# Patient Record
Sex: Male | Born: 1945
Health system: Southern US, Community
[De-identification: ages and names within clinical notes are randomized; demographics above are authoritative.]

## PROBLEM LIST (undated history)

## (undated) DIAGNOSIS — H9190 Unspecified hearing loss, unspecified ear: Secondary | ICD-10-CM

## (undated) DIAGNOSIS — I4891 Unspecified atrial fibrillation: Secondary | ICD-10-CM

## (undated) DIAGNOSIS — I493 Ventricular premature depolarization: Secondary | ICD-10-CM

## (undated) DIAGNOSIS — Z87891 Personal history of nicotine dependence: Secondary | ICD-10-CM

## (undated) DIAGNOSIS — F419 Anxiety disorder, unspecified: Secondary | ICD-10-CM

## (undated) DIAGNOSIS — Z87442 Personal history of urinary calculi: Secondary | ICD-10-CM

## (undated) DIAGNOSIS — T753XXA Motion sickness, initial encounter: Secondary | ICD-10-CM

## (undated) DIAGNOSIS — Z8619 Personal history of other infectious and parasitic diseases: Secondary | ICD-10-CM

## (undated) DIAGNOSIS — I10 Essential (primary) hypertension: Secondary | ICD-10-CM

## (undated) DIAGNOSIS — H3530 Unspecified macular degeneration: Secondary | ICD-10-CM

## (undated) DIAGNOSIS — J449 Chronic obstructive pulmonary disease, unspecified: Secondary | ICD-10-CM

## (undated) DIAGNOSIS — C801 Malignant (primary) neoplasm, unspecified: Secondary | ICD-10-CM

## (undated) DIAGNOSIS — M1990 Unspecified osteoarthritis, unspecified site: Secondary | ICD-10-CM

## (undated) DIAGNOSIS — R011 Cardiac murmur, unspecified: Secondary | ICD-10-CM

## (undated) DIAGNOSIS — G4733 Obstructive sleep apnea (adult) (pediatric): Secondary | ICD-10-CM

## (undated) DIAGNOSIS — I2510 Atherosclerotic heart disease of native coronary artery without angina pectoris: Secondary | ICD-10-CM

## (undated) DIAGNOSIS — K219 Gastro-esophageal reflux disease without esophagitis: Secondary | ICD-10-CM

## (undated) DIAGNOSIS — K4020 Bilateral inguinal hernia, without obstruction or gangrene, not specified as recurrent: Secondary | ICD-10-CM

## (undated) HISTORY — DX: Bilateral inguinal hernia, without obstruction or gangrene, not specified as recurrent: K40.20

## (undated) HISTORY — DX: Atherosclerotic heart disease of native coronary artery without angina pectoris: I25.10

## (undated) HISTORY — DX: Chronic obstructive pulmonary disease, unspecified: J44.9

## (undated) HISTORY — DX: Ventricular premature depolarization: I49.3

## (undated) HISTORY — DX: Personal history of nicotine dependence: Z87.891

## (undated) HISTORY — DX: Obstructive sleep apnea (adult) (pediatric): G47.33

## (undated) HISTORY — DX: Personal history of other infectious and parasitic diseases: Z86.19

## (undated) HISTORY — PX: HERNIA REPAIR: SHX51

---
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
## 1999-06-30 HISTORY — PX: CYSTOSCOPY WITH HOLMIUM LASER LITHOTRIPSY: SHX6639

## 1999-06-30 HISTORY — PX: OTHER SURGICAL HISTORY: SHX169

## 2000-06-29 HISTORY — PX: OTHER SURGICAL HISTORY: SHX169

## 2000-11-19 ENCOUNTER — Ambulatory Visit (HOSPITAL_BASED_OUTPATIENT_CLINIC_OR_DEPARTMENT_OTHER): Admission: RE | Admit: 2000-11-19 | Discharge: 2000-11-19 | Payer: Self-pay | Admitting: Otolaryngology

## 2006-06-24 ENCOUNTER — Ambulatory Visit: Payer: Self-pay | Admitting: General Surgery

## 2006-06-24 HISTORY — PX: OTHER SURGICAL HISTORY: SHX169

## 2006-06-24 HISTORY — PX: COLONOSCOPY: SHX174

## 2008-06-29 HISTORY — PX: CARDIAC CATHETERIZATION: SHX172

## 2012-04-11 DIAGNOSIS — R030 Elevated blood-pressure reading, without diagnosis of hypertension: Secondary | ICD-10-CM | POA: Diagnosis not present

## 2012-04-11 DIAGNOSIS — F17200 Nicotine dependence, unspecified, uncomplicated: Secondary | ICD-10-CM | POA: Diagnosis not present

## 2012-04-11 DIAGNOSIS — IMO0001 Reserved for inherently not codable concepts without codable children: Secondary | ICD-10-CM | POA: Diagnosis not present

## 2012-04-25 DIAGNOSIS — E7800 Pure hypercholesterolemia, unspecified: Secondary | ICD-10-CM | POA: Diagnosis not present

## 2012-04-25 DIAGNOSIS — Z136 Encounter for screening for cardiovascular disorders: Secondary | ICD-10-CM | POA: Diagnosis not present

## 2012-04-25 DIAGNOSIS — F17200 Nicotine dependence, unspecified, uncomplicated: Secondary | ICD-10-CM | POA: Diagnosis not present

## 2012-04-25 DIAGNOSIS — R030 Elevated blood-pressure reading, without diagnosis of hypertension: Secondary | ICD-10-CM | POA: Diagnosis not present

## 2012-04-25 DIAGNOSIS — Z125 Encounter for screening for malignant neoplasm of prostate: Secondary | ICD-10-CM | POA: Diagnosis not present

## 2012-05-28 DIAGNOSIS — Z23 Encounter for immunization: Secondary | ICD-10-CM | POA: Diagnosis not present

## 2012-05-30 DIAGNOSIS — Z1211 Encounter for screening for malignant neoplasm of colon: Secondary | ICD-10-CM | POA: Diagnosis not present

## 2012-05-30 DIAGNOSIS — Z86010 Personal history of colonic polyps: Secondary | ICD-10-CM | POA: Diagnosis not present

## 2012-05-30 DIAGNOSIS — D126 Benign neoplasm of colon, unspecified: Secondary | ICD-10-CM | POA: Diagnosis not present

## 2012-06-09 DIAGNOSIS — IMO0001 Reserved for inherently not codable concepts without codable children: Secondary | ICD-10-CM | POA: Diagnosis not present

## 2012-06-09 DIAGNOSIS — F17200 Nicotine dependence, unspecified, uncomplicated: Secondary | ICD-10-CM | POA: Diagnosis not present

## 2012-06-09 DIAGNOSIS — R030 Elevated blood-pressure reading, without diagnosis of hypertension: Secondary | ICD-10-CM | POA: Diagnosis not present

## 2012-07-19 DIAGNOSIS — Z01818 Encounter for other preprocedural examination: Secondary | ICD-10-CM | POA: Diagnosis not present

## 2012-07-19 DIAGNOSIS — F17200 Nicotine dependence, unspecified, uncomplicated: Secondary | ICD-10-CM | POA: Diagnosis not present

## 2012-07-19 DIAGNOSIS — Z01810 Encounter for preprocedural cardiovascular examination: Secondary | ICD-10-CM | POA: Diagnosis not present

## 2012-07-19 DIAGNOSIS — K429 Umbilical hernia without obstruction or gangrene: Secondary | ICD-10-CM | POA: Diagnosis not present

## 2012-07-19 DIAGNOSIS — K4020 Bilateral inguinal hernia, without obstruction or gangrene, not specified as recurrent: Secondary | ICD-10-CM | POA: Diagnosis not present

## 2012-07-19 DIAGNOSIS — I491 Atrial premature depolarization: Secondary | ICD-10-CM | POA: Diagnosis not present

## 2012-07-20 DIAGNOSIS — K429 Umbilical hernia without obstruction or gangrene: Secondary | ICD-10-CM | POA: Insufficient documentation

## 2012-08-03 DIAGNOSIS — K4020 Bilateral inguinal hernia, without obstruction or gangrene, not specified as recurrent: Secondary | ICD-10-CM | POA: Diagnosis not present

## 2012-08-03 DIAGNOSIS — K429 Umbilical hernia without obstruction or gangrene: Secondary | ICD-10-CM | POA: Diagnosis not present

## 2012-08-23 DIAGNOSIS — K429 Umbilical hernia without obstruction or gangrene: Secondary | ICD-10-CM | POA: Diagnosis not present

## 2012-08-23 DIAGNOSIS — K4020 Bilateral inguinal hernia, without obstruction or gangrene, not specified as recurrent: Secondary | ICD-10-CM | POA: Diagnosis not present

## 2012-08-23 DIAGNOSIS — Z4889 Encounter for other specified surgical aftercare: Secondary | ICD-10-CM | POA: Diagnosis not present

## 2012-10-31 DIAGNOSIS — H353290 Exudative age-related macular degeneration, unspecified eye, stage unspecified: Secondary | ICD-10-CM | POA: Diagnosis not present

## 2013-03-28 DIAGNOSIS — Z23 Encounter for immunization: Secondary | ICD-10-CM | POA: Diagnosis not present

## 2013-04-12 DIAGNOSIS — Z86010 Personal history of colonic polyps: Secondary | ICD-10-CM | POA: Diagnosis not present

## 2013-04-12 DIAGNOSIS — D126 Benign neoplasm of colon, unspecified: Secondary | ICD-10-CM | POA: Diagnosis not present

## 2013-04-17 DIAGNOSIS — R0609 Other forms of dyspnea: Secondary | ICD-10-CM | POA: Diagnosis not present

## 2013-04-17 DIAGNOSIS — F17200 Nicotine dependence, unspecified, uncomplicated: Secondary | ICD-10-CM | POA: Diagnosis not present

## 2013-04-17 DIAGNOSIS — M25519 Pain in unspecified shoulder: Secondary | ICD-10-CM | POA: Diagnosis not present

## 2013-04-17 DIAGNOSIS — Z0000 Encounter for general adult medical examination without abnormal findings: Secondary | ICD-10-CM | POA: Diagnosis not present

## 2013-04-17 DIAGNOSIS — Z131 Encounter for screening for diabetes mellitus: Secondary | ICD-10-CM | POA: Diagnosis not present

## 2013-04-19 DIAGNOSIS — Z0000 Encounter for general adult medical examination without abnormal findings: Secondary | ICD-10-CM | POA: Insufficient documentation

## 2013-04-19 DIAGNOSIS — F17200 Nicotine dependence, unspecified, uncomplicated: Secondary | ICD-10-CM | POA: Insufficient documentation

## 2013-04-24 DIAGNOSIS — I700 Atherosclerosis of aorta: Secondary | ICD-10-CM | POA: Diagnosis not present

## 2013-04-24 DIAGNOSIS — R918 Other nonspecific abnormal finding of lung field: Secondary | ICD-10-CM | POA: Diagnosis not present

## 2013-04-24 DIAGNOSIS — R0609 Other forms of dyspnea: Secondary | ICD-10-CM | POA: Diagnosis not present

## 2013-07-10 DIAGNOSIS — H353290 Exudative age-related macular degeneration, unspecified eye, stage unspecified: Secondary | ICD-10-CM | POA: Diagnosis not present

## 2013-09-01 DIAGNOSIS — Z1212 Encounter for screening for malignant neoplasm of rectum: Secondary | ICD-10-CM | POA: Diagnosis not present

## 2013-09-01 DIAGNOSIS — R030 Elevated blood-pressure reading, without diagnosis of hypertension: Secondary | ICD-10-CM | POA: Diagnosis not present

## 2013-09-01 DIAGNOSIS — Z136 Encounter for screening for cardiovascular disorders: Secondary | ICD-10-CM | POA: Diagnosis not present

## 2013-09-01 DIAGNOSIS — Z0000 Encounter for general adult medical examination without abnormal findings: Secondary | ICD-10-CM | POA: Diagnosis not present

## 2013-09-01 DIAGNOSIS — Z125 Encounter for screening for malignant neoplasm of prostate: Secondary | ICD-10-CM | POA: Diagnosis not present

## 2013-09-01 DIAGNOSIS — R81 Glycosuria: Secondary | ICD-10-CM | POA: Diagnosis not present

## 2013-09-01 DIAGNOSIS — I4949 Other premature depolarization: Secondary | ICD-10-CM | POA: Diagnosis not present

## 2013-09-01 LAB — BASIC METABOLIC PANEL
BUN: 10 mg/dL (ref 4–21)
Creatinine: 1 mg/dL (ref 0.6–1.3)
Glucose: 84 mg/dL
POTASSIUM: 4.4 mmol/L (ref 3.4–5.3)
SODIUM: 144 mmol/L (ref 137–147)

## 2013-09-01 LAB — LIPID PANEL
CHOLESTEROL: 177 mg/dL (ref 0–200)
HDL: 43 mg/dL (ref 35–70)
LDL Cholesterol: 96 mg/dL
Triglycerides: 192 mg/dL — AB (ref 40–160)

## 2013-09-01 LAB — PSA: PSA: 1.5

## 2013-09-01 LAB — TSH: TSH: 2.71 u[IU]/mL (ref 0.41–5.90)

## 2013-09-01 LAB — HEMOGLOBIN A1C: Hemoglobin A1C: 5.8

## 2013-09-07 DIAGNOSIS — H903 Sensorineural hearing loss, bilateral: Secondary | ICD-10-CM | POA: Diagnosis not present

## 2013-09-07 DIAGNOSIS — H905 Unspecified sensorineural hearing loss: Secondary | ICD-10-CM | POA: Diagnosis not present

## 2014-04-26 DIAGNOSIS — Z23 Encounter for immunization: Secondary | ICD-10-CM | POA: Diagnosis not present

## 2014-10-22 DIAGNOSIS — L02439 Carbuncle of limb, unspecified: Secondary | ICD-10-CM | POA: Diagnosis not present

## 2014-10-29 DIAGNOSIS — L02439 Carbuncle of limb, unspecified: Secondary | ICD-10-CM | POA: Diagnosis not present

## 2015-01-29 DIAGNOSIS — H31113 Age-related choroidal atrophy, bilateral: Secondary | ICD-10-CM | POA: Diagnosis not present

## 2015-01-29 DIAGNOSIS — H524 Presbyopia: Secondary | ICD-10-CM | POA: Diagnosis not present

## 2015-01-29 DIAGNOSIS — H34831 Tributary (branch) retinal vein occlusion, right eye: Secondary | ICD-10-CM | POA: Diagnosis not present

## 2015-01-29 DIAGNOSIS — H3531 Nonexudative age-related macular degeneration: Secondary | ICD-10-CM | POA: Diagnosis not present

## 2015-04-16 DIAGNOSIS — Z23 Encounter for immunization: Secondary | ICD-10-CM | POA: Diagnosis not present

## 2015-08-05 DIAGNOSIS — H353133 Nonexudative age-related macular degeneration, bilateral, advanced atrophic without subfoveal involvement: Secondary | ICD-10-CM | POA: Diagnosis not present

## 2015-08-05 DIAGNOSIS — H348312 Tributary (branch) retinal vein occlusion, right eye, stable: Secondary | ICD-10-CM | POA: Diagnosis not present

## 2015-10-21 DIAGNOSIS — I493 Ventricular premature depolarization: Secondary | ICD-10-CM | POA: Insufficient documentation

## 2015-10-21 DIAGNOSIS — R81 Glycosuria: Secondary | ICD-10-CM | POA: Insufficient documentation

## 2015-10-21 DIAGNOSIS — K4020 Bilateral inguinal hernia, without obstruction or gangrene, not specified as recurrent: Secondary | ICD-10-CM | POA: Insufficient documentation

## 2015-10-22 ENCOUNTER — Ambulatory Visit (INDEPENDENT_AMBULATORY_CARE_PROVIDER_SITE_OTHER): Payer: Medicare Other | Admitting: Family Medicine

## 2015-10-22 ENCOUNTER — Encounter: Payer: Self-pay | Admitting: Family Medicine

## 2015-10-22 VITALS — BP 120/80 | HR 64 | Temp 98.5°F | Resp 16 | Wt 208.0 lb

## 2015-10-22 DIAGNOSIS — M19041 Primary osteoarthritis, right hand: Secondary | ICD-10-CM

## 2015-10-22 DIAGNOSIS — M67431 Ganglion, right wrist: Secondary | ICD-10-CM | POA: Diagnosis not present

## 2015-10-22 NOTE — Progress Notes (Signed)
       Patient: Jeffrey Torres Male    DOB: 1946-03-26   70 y.o.   MRN: [id number] Visit Date: 10/22/2015  Today's Provider: Lelon Huh, MD   Chief Complaint  Patient presents with  . Wrist Pain   Subjective:    Wrist Pain  The pain is present in the right fingers, right hand and right wrist. This is a recurrent problem. The current episode started more than 1 year ago (1 1/2 years ago). There has been no history of extremity trauma. The problem occurs constantly. The problem has been gradually worsening. The quality of the pain is described as aching. The pain is at a severity of 8/10. The pain is moderate. Associated symptoms include a limited range of motion, numbness, stiffness and tingling. Pertinent negatives include no fever, inability to bear weight, joint locking or joint swelling. The symptoms are aggravated by cold. He has tried NSAIDS (aleve) for the symptoms. The treatment provided moderate relief.    Knot on under side of right wrist came up about 2 years ago and has slowly grown since. 1 1/2 years ago a knot came up on top of right wrist and has grown since. Pain, numbness and tingling started 6 months ago in the right wrist. Patient takes aleve for the pain with moderate relief. He states his knuckles are also painful when he uses his hands.   No Known Allergies Previous Medications   ASCORBIC ACID (VITAMIN C PO)    Take by mouth daily.   ASPIRIN 81 MG TABLET    Take 81 mg by mouth daily.   MULTIPLE VITAMINS-MINERALS (EYE SUPPORT PO)    Take by mouth daily.    Review of Systems  Constitutional: Negative for fever, chills and appetite change.  Respiratory: Negative for chest tightness, shortness of breath and wheezing.   Cardiovascular: Negative for chest pain and palpitations.  Gastrointestinal: Negative for nausea, vomiting and abdominal pain.  Musculoskeletal: Positive for stiffness.  Neurological: Positive for tingling and numbness.    Social History    Substance Use Topics  . Smoking status: Current Every Day Smoker -- 0.75 packs/day    Types: Cigarettes  . Smokeless tobacco: Not on file  . Alcohol Use: 0.0 oz/week    0 Standard drinks or equivalent per week     Comment: occasional use   Objective:   BP 120/80 mmHg  Pulse 64  Temp(Src) 98.5 F (36.9 C) (Oral)  Resp 16  Wt 208 lb (94.348 kg)  SpO2 97%  Physical Exam  Small non-tender ganglionic cyst volar aspect of right wrist. No erythema.     Assessment & Plan:     1. Ganglion cyst of wrist, right Asymptomatic. Discussed options of observation, excision, or steroid injections. Consider he has no pain, will defer any treatment at this time.   2. Osteoarthritis of right hand, unspecified osteoarthritis type May continue OTC NSAIDs per labile as needed.        Lelon Huh, MD  [facility]

## 2015-10-30 ENCOUNTER — Encounter: Payer: Self-pay | Admitting: Family Medicine

## 2015-10-30 DIAGNOSIS — Z860101 Personal history of adenomatous and serrated colon polyps: Secondary | ICD-10-CM | POA: Insufficient documentation

## 2015-10-30 DIAGNOSIS — Z86010 Personal history of colonic polyps: Secondary | ICD-10-CM | POA: Insufficient documentation

## 2015-12-09 ENCOUNTER — Encounter: Payer: Self-pay | Admitting: Family Medicine

## 2015-12-09 ENCOUNTER — Ambulatory Visit (INDEPENDENT_AMBULATORY_CARE_PROVIDER_SITE_OTHER): Payer: Medicare Other | Admitting: Family Medicine

## 2015-12-09 VITALS — BP 130/70 | HR 62 | Temp 97.6°F | Resp 18 | Ht 71.25 in | Wt 204.0 lb

## 2015-12-09 DIAGNOSIS — Z131 Encounter for screening for diabetes mellitus: Secondary | ICD-10-CM | POA: Diagnosis not present

## 2015-12-09 DIAGNOSIS — Z0000 Encounter for general adult medical examination without abnormal findings: Secondary | ICD-10-CM | POA: Diagnosis not present

## 2015-12-09 DIAGNOSIS — Z125 Encounter for screening for malignant neoplasm of prostate: Secondary | ICD-10-CM | POA: Diagnosis not present

## 2015-12-09 DIAGNOSIS — Z1159 Encounter for screening for other viral diseases: Secondary | ICD-10-CM

## 2015-12-09 NOTE — Progress Notes (Signed)
Patient: Jeffrey Torres, Male    DOB: 08/28/1945, 70 y.o.   MRN: [id number] Visit Date: 12/09/2015  Today's Provider: Lelon Huh, MD   Chief Complaint  Patient presents with  . Medicare Wellness  . Irregular Heart Beat    follow up   Subjective:    Annual wellness visit Jeffrey Torres is a 70 y.o. male. He feels well. He reports exercising daily. He reports he is sleeping fairly well.    Review of Systems  Constitutional: Negative for fever, chills, appetite change and fatigue.  HENT: Positive for congestion, sneezing and tinnitus. Negative for ear pain, hearing loss, nosebleeds and trouble swallowing.   Eyes: Positive for photophobia. Negative for pain and visual disturbance.  Respiratory: Positive for cough. Negative for chest tightness and shortness of breath.   Cardiovascular: Negative for chest pain, palpitations and leg swelling.  Gastrointestinal: Negative for nausea, vomiting, abdominal pain, diarrhea, constipation and blood in stool.  Endocrine: Negative for polydipsia, polyphagia and polyuria.  Genitourinary: Negative for dysuria and flank pain.  Musculoskeletal: Positive for back pain, arthralgias and neck stiffness. Negative for myalgias and joint swelling.  Skin: Negative for color change, rash and wound.  Neurological: Negative for dizziness, tremors, seizures, speech difficulty, weakness, light-headedness and headaches.  Psychiatric/Behavioral: Positive for sleep disturbance. Negative for behavioral problems, confusion, dysphoric mood and decreased concentration. The patient is not nervous/anxious.   All other systems reviewed and are negative.   Social History   Social History  . Marital Status: Married    Spouse Name: N/A  . Number of Children: N/A  . Years of Education: N/A   Occupational History  . Not on file.   Social History Main Topics  . Smoking status: Current Every Day Smoker -- 0.75 packs/day    Types: Cigarettes  . Smokeless  tobacco: Not on file  . Alcohol Use: 0.0 oz/week    0 Standard drinks or equivalent per week     Comment: occasional use  . Drug Use: No  . Sexual Activity: Not on file   Other Topics Concern  . Not on file   Social History Narrative    Past Medical History  Diagnosis Date  . Glucosuria   . PVC (premature ventricular contraction)   . Hernia, inguinal, bilateral   . History of chicken pox   . History of mumps   . History of measles      Patient Active Problem List   Diagnosis Date Noted  . History of adenomatous polyp of colon 10/30/2015  . Inguinal hernia, bilateral 10/21/2015  . PVC (premature ventricular contraction) 10/21/2015  . Glucosuria 10/21/2015    Past Surgical History  Procedure Laterality Date  . Tubular adenoma removed  06/24/2006  . Sinus surgery   2002    [place]; removal of polyps  . Kidney stones removed  2001    [place]  . Tonsillectomy  1954  . Cardiac catheterization  2010    normal per patient report  . Colonoscopy  06/24/2006    Dr. Bary Castilla. Multiple benign appearing 76m polyps in the cecum and in the rectum. -Three 874mpolyps in the transversed colon, in the tranverse colon, proximal and in the distal transverse colon. Resected and retrieved.    His family history includes Heart disease in his father; Lung cancer in his mother.    Current Meds  Medication Sig  . Ascorbic Acid (VITAMIN C PO) Take by mouth daily.  . Marland Kitchenspirin 81 MG  tablet Take 81 mg by mouth daily.  . Multiple Vitamins-Minerals (EYE SUPPORT PO) Take by mouth daily.    Patient Care Team: Birdie Sons, MD as PCP - General (Family Medicine) Historical Provider, MD    Objective:   Vitals: BP 130/70 mmHg  Pulse 62  Temp(Src) 97.6 F (36.4 C) (Oral)  Resp 18  Ht 5' 11.25" (1.81 m)  Wt 204 lb (92.534 kg)  BMI 28.25 kg/m2  SpO2 97%  Physical Exam  General appearance: alert, well developed, well nourished, cooperative and in no distress Head: Normocephalic,  without obvious abnormality, atraumatic Respiratory: Respirations even and unlabored, normal respiratory rate Extremities: No gross deformities Skin: Skin color, texture, turgor normal. No rashes seen  Psych: Appropriate mood and affect. Neurologic: Mental status: Alert, oriented to person, place, and time, thought content appropriate.  Activities of Daily Living In your present state of health, do you have any difficulty performing the following activities: 12/09/2015  Hearing? Y  Vision? Y  Difficulty concentrating or making decisions? N  Walking or climbing stairs? N  Dressing or bathing? N  Doing errands, shopping? N    Fall Risk Assessment Fall Risk  12/09/2015  Falls in the past year? No     Depression Screen PHQ 2/9 Scores 12/09/2015  PHQ - 2 Score 0    Cognitive Testing - 6-CIT  Correct? Score   What year is it? yes 0 0 or 4  What month is it? yes 0 0 or 3  Memorize:    Pia Mau,  43,  [place],      What time is it? (within 1 hour) yes 0 0 or 3  Count backwards from 20 yes 0 0, 2, or 4  Name the months of the year yes 0 0, 2, or 4  Repeat name & address above no 3 0, 2, 4, 6, 8, or 10       TOTAL SCORE  3/28   Interpretation:  Normal  Normal (0-7) Abnormal (8-28)    Audit-C Alcohol Use Screening  Question Answer Points  How often do you have alcoholic drink? 2-4 times monthly 2  On days you do drink alcohol, how many drinks do you typically consume? 5 or 6 2  How oftey will you drink 6 or more in a total? Less than once/ monthly 1  Total Score:  5   A score of 3 or more in women, and 4 or more in men indicates increased risk for alcohol abuse, EXCEPT if all of the points are from question 1.  Current Exercise Habits: Home exercise routine, Type of exercise: walking, Time (Minutes): 30, Frequency (Times/Week): 7, Weekly Exercise (Minutes/Week): 210, Intensity: Mild Exercise limited by: None identified   Assessment & Plan:     Annual  Wellness Visit  Reviewed patient's Family Medical History Reviewed and updated list of patient's medical providers Assessment of cognitive impairment was done Assessed patient's functional ability Established a written schedule for health screening [place] Completed and Reviewed  Exercise Activities and Dietary recommendations Goals    None      Immunization History  Administered Date(s) Administered  . Pneumococcal Conjugate-13 04/26/2014  . Pneumococcal Polysaccharide-23 05/28/2012    Health Maintenance  Topic Date Due  . Hepatitis C Screening  1946-01-23  . TETANUS/TDAP  03/13/1966  . ZOSTAVAX  03/14/2007  . INFLUENZA VACCINE  01/28/2016  . COLONOSCOPY  04/11/2016  . PNA vac Low Risk Adult  Completed  Discussed health benefits of physical activity, and encouraged him to engage in regular exercise appropriate for his age and condition.    ------------------------------------------------------------------------------------------------------------ 1. Medicare annual wellness visit, subsequent Doing well. He is going to check with his pharmacist regarding coverage for Shingles vaccine  2. Need for hepatitis C screening test  - Hepatitis C antibody  3. Diabetes mellitus screening  - Glucose  4. Prostate cancer screening  - PSA  The entirety of the information documented in the History of Present Illness, Review of Systems and Physical Exam were personally obtained by me. Portions of this information were initially documented by Meyer Cory, CMA and reviewed by me for thoroughness and accuracy.     Lelon Huh, MD  [facility]

## 2015-12-10 LAB — PSA: PROSTATE SPECIFIC AG, SERUM: 1.7 ng/mL (ref 0.0–4.0)

## 2015-12-10 LAB — HEPATITIS C ANTIBODY: Hep C Virus Ab: <0.1 s/co ratio (ref 0.0–0.9)

## 2015-12-10 LAB — GLUCOSE, RANDOM: GLUCOSE: 94 mg/dL (ref 65–99)

## 2015-12-27 DIAGNOSIS — H353221 Exudative age-related macular degeneration, left eye, with active choroidal neovascularization: Secondary | ICD-10-CM | POA: Diagnosis not present

## 2016-01-03 DIAGNOSIS — H353211 Exudative age-related macular degeneration, right eye, with active choroidal neovascularization: Secondary | ICD-10-CM | POA: Diagnosis not present

## 2016-01-06 DIAGNOSIS — H353221 Exudative age-related macular degeneration, left eye, with active choroidal neovascularization: Secondary | ICD-10-CM | POA: Diagnosis not present

## 2016-02-03 DIAGNOSIS — H353211 Exudative age-related macular degeneration, right eye, with active choroidal neovascularization: Secondary | ICD-10-CM | POA: Diagnosis not present

## 2016-02-03 DIAGNOSIS — H353221 Exudative age-related macular degeneration, left eye, with active choroidal neovascularization: Secondary | ICD-10-CM | POA: Diagnosis not present

## 2016-03-06 DIAGNOSIS — H353221 Exudative age-related macular degeneration, left eye, with active choroidal neovascularization: Secondary | ICD-10-CM | POA: Diagnosis not present

## 2016-04-14 DIAGNOSIS — Z23 Encounter for immunization: Secondary | ICD-10-CM | POA: Diagnosis not present

## 2016-04-28 DIAGNOSIS — H2513 Age-related nuclear cataract, bilateral: Secondary | ICD-10-CM | POA: Diagnosis not present

## 2016-04-28 DIAGNOSIS — H353230 Exudative age-related macular degeneration, bilateral, stage unspecified: Secondary | ICD-10-CM | POA: Diagnosis not present

## 2016-04-28 DIAGNOSIS — H35362 Drusen (degenerative) of macula, left eye: Secondary | ICD-10-CM | POA: Diagnosis not present

## 2016-04-28 DIAGNOSIS — H43393 Other vitreous opacities, bilateral: Secondary | ICD-10-CM | POA: Diagnosis not present

## 2016-06-16 DIAGNOSIS — H2513 Age-related nuclear cataract, bilateral: Secondary | ICD-10-CM | POA: Diagnosis not present

## 2016-06-16 DIAGNOSIS — H43813 Vitreous degeneration, bilateral: Secondary | ICD-10-CM | POA: Diagnosis not present

## 2016-06-16 DIAGNOSIS — H353230 Exudative age-related macular degeneration, bilateral, stage unspecified: Secondary | ICD-10-CM | POA: Diagnosis not present

## 2016-08-04 DIAGNOSIS — K621 Rectal polyp: Secondary | ICD-10-CM | POA: Diagnosis not present

## 2016-08-04 DIAGNOSIS — D122 Benign neoplasm of ascending colon: Secondary | ICD-10-CM | POA: Diagnosis not present

## 2016-08-04 DIAGNOSIS — Z86010 Personal history of colonic polyps: Secondary | ICD-10-CM | POA: Diagnosis not present

## 2016-08-04 DIAGNOSIS — D123 Benign neoplasm of transverse colon: Secondary | ICD-10-CM | POA: Diagnosis not present

## 2016-08-04 DIAGNOSIS — D369 Benign neoplasm, unspecified site: Secondary | ICD-10-CM | POA: Diagnosis not present

## 2016-08-04 DIAGNOSIS — D124 Benign neoplasm of descending colon: Secondary | ICD-10-CM | POA: Diagnosis not present

## 2016-08-04 DIAGNOSIS — K635 Polyp of colon: Secondary | ICD-10-CM | POA: Diagnosis not present

## 2016-08-04 DIAGNOSIS — K5730 Diverticulosis of large intestine without perforation or abscess without bleeding: Secondary | ICD-10-CM | POA: Diagnosis not present

## 2016-08-04 LAB — HM COLONOSCOPY

## 2016-08-11 DIAGNOSIS — H353221 Exudative age-related macular degeneration, left eye, with active choroidal neovascularization: Secondary | ICD-10-CM | POA: Diagnosis not present

## 2016-08-24 ENCOUNTER — Encounter: Payer: Self-pay | Admitting: Family Medicine

## 2016-08-24 DIAGNOSIS — M4606 Spinal enthesopathy, lumbar region: Secondary | ICD-10-CM | POA: Diagnosis not present

## 2016-08-24 DIAGNOSIS — Z1151 Encounter for screening for human papillomavirus (HPV): Secondary | ICD-10-CM | POA: Insufficient documentation

## 2016-08-24 DIAGNOSIS — M9903 Segmental and somatic dysfunction of lumbar region: Secondary | ICD-10-CM | POA: Diagnosis not present

## 2016-08-25 DIAGNOSIS — M9903 Segmental and somatic dysfunction of lumbar region: Secondary | ICD-10-CM | POA: Diagnosis not present

## 2016-08-25 DIAGNOSIS — M4606 Spinal enthesopathy, lumbar region: Secondary | ICD-10-CM | POA: Diagnosis not present

## 2016-09-03 ENCOUNTER — Encounter: Payer: Self-pay | Admitting: Family Medicine

## 2016-10-06 DIAGNOSIS — H353221 Exudative age-related macular degeneration, left eye, with active choroidal neovascularization: Secondary | ICD-10-CM | POA: Diagnosis not present

## 2016-11-09 DIAGNOSIS — H2512 Age-related nuclear cataract, left eye: Secondary | ICD-10-CM | POA: Diagnosis not present

## 2016-12-08 DIAGNOSIS — H353221 Exudative age-related macular degeneration, left eye, with active choroidal neovascularization: Secondary | ICD-10-CM | POA: Diagnosis not present

## 2016-12-15 ENCOUNTER — Ambulatory Visit (INDEPENDENT_AMBULATORY_CARE_PROVIDER_SITE_OTHER): Payer: Medicare Other

## 2016-12-15 VITALS — BP 140/82 | HR 68 | Temp 97.5°F | Ht 71.0 in | Wt 209.0 lb

## 2016-12-15 DIAGNOSIS — Z0000 Encounter for general adult medical examination without abnormal findings: Secondary | ICD-10-CM | POA: Diagnosis not present

## 2016-12-15 NOTE — Progress Notes (Signed)
Subjective:   Jeffrey Torres is a 71 y.o. male who presents for Medicare Annual/Subsequent preventive examination.  Review of Systems:  N/A  Cardiac Risk Factors include: advanced age (>46men, >33 women);male gender;smoking/ tobacco exposure     Objective:    Vitals: BP 140/82 (BP Location: Right Arm)   Pulse 68   Temp 97.5 F (36.4 C) (Oral)   Ht 5\' 11"  (1.803 m)   Wt 209 lb (94.8 kg)   BMI 29.15 kg/m   Body mass index is 29.15 kg/m.  Tobacco History  Smoking Status  . Current Every Day Smoker  . Packs/day: 0.75  . Types: Cigarettes  Smokeless Tobacco  . Never Used     Ready to quit: Not Answered Counseling given: Not Answered   Past Medical History:  Diagnosis Date  . Glucosuria   . Hernia, inguinal, bilateral   . History of chicken pox   . History of measles   . History of mumps   . PVC (premature ventricular contraction)    Past Surgical History:  Procedure Laterality Date  . CARDIAC CATHETERIZATION  2010   normal per patient report  . COLONOSCOPY  06/24/2006   Dr. Bary Castilla. Multiple benign appearing 46mm polyps in the cecum and in the rectum. -Three 35mm polyps in the transversed colon, in the tranverse colon, proximal and in the distal transverse colon. Resected and retrieved.  . Kidney stones removed  2001   [place]  . sinus surgery   2002   [place]; removal of polyps  . TONSILLECTOMY  1954  . Tubular adenoma removed  06/24/2006   Family History  Problem Relation Age of Onset  . Lung cancer Mother   . Heart disease Father    History  Sexual Activity  . Sexual activity: Not on file    Outpatient Encounter Prescriptions as of 12/15/2016  Medication Sig  . Ascorbic Acid (VITAMIN C PO) Take 200 mg by mouth daily.   Marland Kitchen aspirin 81 MG tablet Take 81 mg by mouth daily.  . Multiple Vitamins-Minerals (OCUVITE EYE HEALTH FORMULA PO) Take by mouth daily.   . Omega-3 Fatty Acids (FISH OIL PO) Take 500 mg by mouth daily.   . [DISCONTINUED] Multiple  Vitamins-Minerals (EYE SUPPORT PO) Take by mouth daily.   No facility-administered encounter medications on file as of 12/15/2016.     Activities of Daily Living In your present state of health, do you have any difficulty performing the following activities: 12/15/2016  Hearing? Y  Vision? Y  Difficulty concentrating or making decisions? N  Walking or climbing stairs? N  Dressing or bathing? N  Doing errands, shopping? N  Preparing Food and eating ? N  Using the Toilet? N  In the past six months, have you accidently leaked urine? N  Do you have problems with loss of bowel control? N  Managing your Medications? N  Managing your Finances? N  Housekeeping or managing your Housekeeping? N  Some recent data might be hidden    Patient Care Team: Birdie Sons, MD as PCP - General (Family Medicine) [provider] Birder Robson, MD as Referring Physician (Ophthalmology)   Assessment:     Exercise Activities and Dietary recommendations Current Exercise Habits: Home exercise routine, Type of exercise: walking, Time (Minutes): 25, Frequency (Times/Week): 7, Weekly Exercise (Minutes/Week): 175, Intensity: Mild, Exercise limited by: None identified  Goals    . Increase water intake          Recommend increasing water intake  to 6-8 glasses a day.      Fall Risk Fall Risk  12/15/2016 12/09/2015  Falls in the past year? No No   Depression Screen PHQ 2/9 Scores 12/15/2016 12/15/2016 12/09/2015  PHQ - 2 Score 0 0 0  PHQ- 9 Score 4 - -    Cognitive Function     6CIT Screen 12/15/2016  What Year? 0 points  What month? 0 points  What time? 0 points  Count back from 20 0 points  Months in reverse 0 points  Repeat phrase 0 points  Total Score 0    Immunization History  Administered Date(s) Administered  . Pneumococcal Conjugate-13 04/26/2014  . Pneumococcal Polysaccharide-23 05/28/2012   Screening Tests Health Maintenance  Topic Date Due  . TETANUS/TDAP   11/27/2017 (Originally 03/13/1966)  . INFLUENZA VACCINE  01/27/2017  . COLONOSCOPY  08/05/2019  . Hepatitis C Screening  Completed  . PNA vac Low Risk Adult  Completed      Plan:  I have personally reviewed and addressed the Medicare Annual Wellness questionnaire and have noted the following in the patient's chart:  A. Medical and social history B. Use of alcohol, tobacco or illicit drugs  C. Current medications and supplements D. Functional ability and status E.  Nutritional status F.  Physical activity G. Advance directives H. List of other physicians I.  Hospitalizations, surgeries, and ER visits in previous 12 months J.  [place] such as hearing and vision if needed, cognitive and depression L. Referrals and appointments - none  In addition, I have reviewed and discussed with patient certain preventive protocols, quality metrics, and best practice recommendations. A written personalized care plan for preventive services as well as general preventive health recommendations were provided to patient.  See attached scanned questionnaire for additional information.   Signed,  Fabio Neighbors, LPN Nurse Health Advisor   MD Recommendations: None, pt declined tetanus vaccine today. Pt will check with insurance for coverage first.

## 2016-12-15 NOTE — Patient Instructions (Signed)
Jeffrey Torres , Thank you for taking time to come for your Medicare Wellness Visit. I appreciate your ongoing commitment to your health goals. Please review the following plan we discussed and let me know if I can assist you in the future.   Screening recommendations/referrals: Colonoscopy: completed 08/04/16, due 07/2026 Recommended yearly op/h/thalmology/optometry visit for glaucoma screening and checkup Recommended yearly dental visit for hygiene and checkup  Vaccinations: Influenza vaccine: due 02/2017 Pneumococcal vaccine: completed series Tdap vaccine: declined Shingles vaccine: completed per patient  Advanced directives: Advance directive discussed with you today. Even though you declined this today please call our office should you change your mind and we can give you the proper paperwork for you to fill out.  Conditions/risks identified:Recommend increasing water intake to 6-8 glasses a day.   Next appointment: None, need to schedule follow up with PCP and 1 year AWV.  Preventive Care 70 Years and Older, Male Preventive care refers to lifestyle choices and visits with your health care provider that can promote health and wellness. What does preventive care include?  A yearly physical exam. This is also called an annual well check.  Dental exams once or twice a year.  Routine eye exams. Ask your health care provider how often you should have your eyes checked.  Personal lifestyle choices, including:  Daily care of your teeth and gums.  Regular physical activity.  Eating a healthy diet.  Avoiding tobacco and drug use.  Limiting alcohol use.  Practicing safe sex.  Taking low doses of aspirin every day.  Taking vitamin and mineral supplements as recommended by your health care provider. What happens during an annual well check? The services and screenings done by your health care provider during your annual well check will depend on your age, overall health, lifestyle  risk factors, and family history of disease. Counseling  Your health care provider may ask you questions about your:  Alcohol use.  Tobacco use.  Drug use.  Emotional well-being.  Home and relationship well-being.  Sexual activity.  Eating habits.  History of falls.  Memory and ability to understand (cognition).  Work and work Statistician. Screening  You may have the following tests or measurements:  Height, weight, and BMI.  Blood pressure.  Lipid and cholesterol levels. These may be checked every 5 years, or more frequently if you are over 70 years old.  Skin check.  Lung cancer screening. You may have this screening every year starting at age 70 if you have a 30-pack-year history of smoking and currently smoke or have quit within the past 15 years.  Fecal occult blood test (FOBT) of the stool. You may have this test every year starting at age 70.  Flexible sigmoidoscopy or colonoscopy. You may have a sigmoidoscopy every 5 years or a colonoscopy every 10 years starting at age 70.  Prostate cancer screening. Recommendations will vary depending on your family history and other risks.  Hepatitis C blood test.  Hepatitis B blood test.  Sexually transmitted disease (STD) testing.  Diabetes screening. This is done by checking your blood sugar (glucose) after you have not eaten for a while (fasting). You may have this done every 1-3 years.  Abdominal aortic aneurysm (AAA) screening. You may need this if you are a current or former smoker.  Osteoporosis. You may be screened starting at age 70 if you are at high risk. Talk with your health care provider about your test results, treatment options, and if necessary, the need for more  tests. Vaccines  Your health care provider may recommend certain vaccines, such as:  Influenza vaccine. This is recommended every year.  Tetanus, diphtheria, and acellular pertussis (Tdap, Td) vaccine. You may need a Td booster every 10  years.  Zoster vaccine. You may need this after age 66.  Pneumococcal 13-valent conjugate (PCV13) vaccine. One dose is recommended after age 70.  Pneumococcal polysaccharide (PPSV23) vaccine. One dose is recommended after age 70. Talk to your health care provider about which screenings and vaccines you need and how often you need them. This information is not intended to replace advice given to you by your health care provider. Make sure you discuss any questions you have with your health care provider. Document Released: 07/12/2015 Document Revised: 03/04/2016 Document Reviewed: 04/16/2015 Elsevier Interactive Patient Education  2017 [place] Prevention in the Home Falls can cause injuries. They can happen to people of all ages. There are many things you can do to make your home safe and to help prevent falls. What can I do on the outside of my home?  Regularly fix the edges of walkways and driveways and fix any cracks.  Remove anything that might make you trip as you walk through a door, such as a raised step or threshold.  Trim any bushes or trees on the path to your home.  Use bright outdoor lighting.  Clear any walking paths of anything that might make someone trip, such as rocks or tools.  Regularly check to see if handrails are loose or broken. Make sure that both sides of any steps have handrails.  Any raised decks and porches should have guardrails on the edges.  Have any leaves, snow, or ice cleared regularly.  Use sand or salt on walking paths during winter.  Clean up any spills in your garage right away. This includes oil or grease spills. What can I do in the bathroom?  Use night lights.  Install grab bars by the toilet and in the tub and shower. Do not use towel bars as grab bars.  Use non-skid mats or decals in the tub or shower.  If you need to sit down in the shower, use a plastic, non-slip stool.  Keep the floor dry. Clean up any water that  spills on the floor as soon as it happens.  Remove soap buildup in the tub or shower regularly.  Attach bath mats securely with double-sided non-slip rug tape.  Do not have throw rugs and other things on the floor that can make you trip. What can I do in the bedroom?  Use night lights.  Make sure that you have a light by your bed that is easy to reach.  Do not use any sheets or blankets that are too big for your bed. They should not hang down onto the floor.  Have a firm chair that has side arms. You can use this for support while you get dressed.  Do not have throw rugs and other things on the floor that can make you trip. What can I do in the kitchen?  Clean up any spills right away.  Avoid walking on wet floors.  Keep items that you use a lot in easy-to-reach places.  If you need to reach something above you, use a strong step stool that has a grab bar.  Keep electrical cords out of the way.  Do not use floor polish or wax that makes floors slippery. If you must use wax, use non-skid floor  wax.  Do not have throw rugs and other things on the floor that can make you trip. What can I do with my stairs?  Do not leave any items on the stairs.  Make sure that there are handrails on both sides of the stairs and use them. Fix handrails that are broken or loose. Make sure that handrails are as long as the stairways.  Check any carpeting to make sure that it is firmly attached to the stairs. Fix any carpet that is loose or worn.  Avoid having throw rugs at the top or bottom of the stairs. If you do have throw rugs, attach them to the floor with carpet tape.  Make sure that you have a light switch at the top of the stairs and the bottom of the stairs. If you do not have them, ask someone to add them for you. What else can I do to help prevent falls?  Wear shoes that:  Do not have high heels.  Have rubber bottoms.  Are comfortable and fit you well.  Are closed at the  toe. Do not wear sandals.  If you use a stepladder:  Make sure that it is fully opened. Do not climb a closed stepladder.  Make sure that both sides of the stepladder are locked into place.  Ask someone to hold it for you, if possible.  Clearly mark and make sure that you can see:  Any grab bars or handrails.  First and last steps.  Where the edge of each step is.  Use tools that help you move around (mobility aids) if they are needed. These include:  Canes.  Walkers.  Scooters.  Crutches.  Turn on the lights when you go into a dark area. Replace any light bulbs as soon as they burn out.  Set up your furniture so you have a clear path. Avoid moving your furniture around.  If any of your floors are uneven, fix them.  If there are any pets around you, be aware of where they are.  Review your medicines with your doctor. Some medicines can make you feel dizzy. This can increase your chance of falling. Ask your doctor what other things that you can do to help prevent falls. This information is not intended to replace advice given to you by your health care provider. Make sure you discuss any questions you have with your health care provider. Document Released: 04/11/2009 Document Revised: 11/21/2015 Document Reviewed: 07/20/2014 Elsevier Interactive Patient Education  2017 [employer].

## 2017-02-22 DIAGNOSIS — H353221 Exudative age-related macular degeneration, left eye, with active choroidal neovascularization: Secondary | ICD-10-CM | POA: Diagnosis not present

## 2017-03-02 DIAGNOSIS — J387 Other diseases of larynx: Secondary | ICD-10-CM | POA: Diagnosis not present

## 2017-03-02 DIAGNOSIS — J301 Allergic rhinitis due to pollen: Secondary | ICD-10-CM | POA: Diagnosis not present

## 2017-03-02 DIAGNOSIS — K219 Gastro-esophageal reflux disease without esophagitis: Secondary | ICD-10-CM | POA: Diagnosis not present

## 2017-03-02 DIAGNOSIS — F17200 Nicotine dependence, unspecified, uncomplicated: Secondary | ICD-10-CM | POA: Diagnosis not present

## 2017-03-08 DIAGNOSIS — H353221 Exudative age-related macular degeneration, left eye, with active choroidal neovascularization: Secondary | ICD-10-CM | POA: Diagnosis not present

## 2017-03-23 ENCOUNTER — Ambulatory Visit: Payer: Medicare Other | Admitting: Family Medicine

## 2017-03-23 DIAGNOSIS — R490 Dysphonia: Secondary | ICD-10-CM | POA: Diagnosis not present

## 2017-03-23 DIAGNOSIS — D380 Neoplasm of uncertain behavior of larynx: Secondary | ICD-10-CM | POA: Diagnosis not present

## 2017-03-25 ENCOUNTER — Encounter: Payer: Self-pay | Admitting: Family Medicine

## 2017-03-25 ENCOUNTER — Ambulatory Visit (INDEPENDENT_AMBULATORY_CARE_PROVIDER_SITE_OTHER): Payer: Medicare Other | Admitting: Family Medicine

## 2017-03-25 VITALS — BP 110/60 | HR 80 | Temp 98.0°F | Resp 16 | Ht 71.0 in | Wt 222.0 lb

## 2017-03-25 DIAGNOSIS — Z01818 Encounter for other preprocedural examination: Secondary | ICD-10-CM | POA: Diagnosis not present

## 2017-03-25 DIAGNOSIS — F17210 Nicotine dependence, cigarettes, uncomplicated: Secondary | ICD-10-CM

## 2017-03-25 DIAGNOSIS — Z23 Encounter for immunization: Secondary | ICD-10-CM

## 2017-03-25 NOTE — Progress Notes (Signed)
Patient: Jeffrey Torres Male    DOB: 1946/06/14   71 y.o.   MRN: [id number] Visit Date: 03/25/2017  Today's Provider: Lelon Huh, MD   No chief complaint on file.  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 Had been smoker since he was in his 21s. From 1/2 to 1 1/2 ppd. He is also anticipating laryngeal surgery by Dr. Pryor Ochoa on 04-01-2017 and requires surgical clearance. He has anesthesia clearance scheduled tomorrow. He denies any dyspnea on exertion, chest pains, or heart flutters. No personal or family history of adverse reaction to anesthesia. No history of significant surgical complications.    Past Surgical History:  Procedure Laterality Date  . CARDIAC CATHETERIZATION  2010   normal per patient report  . COLONOSCOPY  06/24/2006   Dr. Bary Castilla. Multiple benign appearing 55mm polyps in the cecum and in the rectum. -Three 89mm polyps in the transversed colon, in the tranverse colon, proximal and in the distal transverse colon. Resected and retrieved.  . Kidney stones removed  2001   [place]  . sinus surgery   [place]; removal of polyps  . TONSILLECTOMY  1954  . Tubular adenoma removed  06/24/2006     Allergies  Allergen Reactions  . Penicillins Swelling    Other reaction(s): SWELLING/EDEMA     Current Outpatient Prescriptions:  .  aspirin 81 MG tablet, Take 81 mg by mouth daily., Disp: , Rfl:  .  fluticasone (FLONASE) 50 MCG/ACT nasal spray, Place 2 sprays into both nostrils every morning., Disp: , Rfl:  .  Multiple Vitamins-Minerals (PRESERVISION AREDS 2 PO), Take 2 capsules by mouth every morning., Disp: , Rfl:  .  omeprazole (PRILOSEC) 40 MG capsule, Take 40 mg by mouth every morning. 42 MIN BEFORE EATING, Disp: , Rfl:   Review of Systems  Constitutional: Negative for appetite change, chills and fever.  Respiratory: Negative for chest tightness, shortness of breath and wheezing.     Cardiovascular: Negative for chest pain and palpitations.  Gastrointestinal: Negative for abdominal pain, nausea and vomiting.    Social History  Substance Use Topics  . Smoking status: Current Every Day Smoker    Packs/day: 0.75    Types: Cigarettes  . Smokeless tobacco: Never Used  . Alcohol use 0.0 oz/week     Comment: occasional use- beer   Objective:   BP 110/60 (BP Location: Right Arm, Patient Position: Sitting, Cuff Size: Large)   Pulse 80   Temp 98 F (36.7 C) (Oral)   Resp 16   Ht 5\' 11"  (1.803 m)   Wt 222 lb (100.7 kg)   SpO2 94%   BMI 30.96 kg/m     Physical Exam  General Appearance:    Alert, cooperative, no distress  HENT:   ENT exam normal, no neck nodes or sinus tenderness  Eyes:    PERRL, conjunctiva/corneas clear, EOM's intact       Lungs:     Clear to auscultation bilaterally, respirations unlabored  Heart:    Regular rate and rhythm  Neurologic:   Awake, alert, oriented x 3. No apparent focal neurological           defect.      Spriometry: Normal EKG: Normal     Assessment & Plan:     1. Pre-op exam Patient has no absolute or relative contraindications to planned surgery and anesthesia including  general anesthesia. He is at low risk for cardiac and pulmonary complications. There are no additional medical precautions recommended.   - Spirometry with Graph - EKG 12-Lead  2. Need for influenza vaccination   - Flu vaccine HIGH DOSE PF  3. Smoking greater than 30 pack years  - Spirometry with Graph       Lelon Huh, MD  [facility]

## 2017-03-26 ENCOUNTER — Encounter
Admission: RE | Admit: 2017-03-26 | Discharge: 2017-03-26 | Disposition: A | Discharge status: Home / Self Care | Payer: Medicare Other | Source: Ambulatory Visit | Attending: Otolaryngology | Admitting: Otolaryngology

## 2017-03-26 DIAGNOSIS — Z01818 Encounter for other preprocedural examination: Secondary | ICD-10-CM | POA: Diagnosis not present

## 2017-03-26 HISTORY — DX: Unspecified macular degeneration: H35.30

## 2017-03-26 HISTORY — DX: Unspecified osteoarthritis, unspecified site: M19.90

## 2017-03-26 HISTORY — DX: Gastro-esophageal reflux disease without esophagitis: K21.9

## 2017-03-26 HISTORY — DX: Personal history of urinary calculi: Z87.442

## 2017-03-26 NOTE — Patient Instructions (Signed)
Your procedure is scheduled on: April 01, 2017 (THURSDAY ) Report to Same Day Surgery 2nd floor medical mall ([place] Entrance-take elevator on left to 2nd floor.  Check in with surgery information desk.) To find out your arrival time please call [phone] between 1PM - 3PM on March 31, 2017 [facility] )    Remember: Instructions that are not followed completely may result in serious medical risk, up to and including death, or upon the discretion of your surgeon and anesthesiologist your surgery may need to be rescheduled.    _x___ 1. Do not eat food after midnight the night before your procedure. You may drink clear liquids up to 2 hours before you are scheduled to arrive at the hospital for your procedure.  Do not drink clear liquids within 2 hours of your scheduled arrival to the hospital.  Clear liquids include  --Water or Apple juice without pulp  --Clear carbohydrate beverage such as ClearFast or Gatorade  --Black Coffee or Clear Tea (No milk, no creamers, do not add anything to                  the coffee or Tea Type 1 and type 2 diabetics should only drink water.  No gum chewing or hard candies.     __x__ 2. No Alcohol for 24 hours before or after surgery.   __x__3. No Smoking for 24 prior to surgery.   ____  4. Bring all medications with you on the day of surgery if instructed.    __x__ 5. Notify your doctor if there is any change in your medical condition     (cold, fever, infections).     Do not wear jewelry, make-up, hairpins, clips or nail polish.  Do not wear lotions, powders, or perfumes. You may wear deodorant.  Do not shave 48 hours prior to surgery. Men may shave face and neck.  Do not bring valuables to the hospital.    [facility] is not responsible for any belongings or valuables.               Contacts, dentures or bridgework may not be worn into surgery.  Leave your suitcase in the car. After surgery it may be brought to your room.  For patients  admitted to the hospital, discharge time is determined by your treatment team                        Patients discharged the day of surgery will not be allowed to drive home.  You will need someone to drive you home and stay with you the night of your procedure.    Please read over the following fact sheets that you were given:   [facility] Preparing for Surgery and or MRSA Information   TAKE THE FOLLOWING MEDICATION THE MORNING OF SURGERY WITH A SIP OF WATER :    1. PRILOSEC  2. PRILOSEC AT BEDTIME ON OCTOBER  3 (WEDNESDAY NIGHT )    ____Fleets enema or Magnesium Citrate as directed.   __ Use CHG Soap or sage wipes as directed on instruction sheet   ____ Use inhalers on the day of surgery and bring to hospital day of surgery  ____ Stop Metformin and Janumet 2 days prior to surgery.    ____ Take 1/2 of usual insulin dose the night before surgery and none on the morning surgery.     .   _x___ Follow recommendations from Cardiologist, Pulmonologist or  PCP regarding          stopping Aspirin, Coumadin, Plavix ,Eliquis, Effient, or Pradaxa, and Pletal. (PATIENT HAS STOPPED ASPIRIN )  X____Stop Anti-inflammatories such as Advil, Aleve, Ibuprofen, Motrin, Naproxen, Naprosyn, Goodies powders or aspirin products. OK to take Tylenol     _x___ Stop supplements until after surgery.  But may continue Vitamin D, Vitamin B,and multivitamin.       ____ Bring C-Pap to the hospital.

## 2017-04-01 ENCOUNTER — Ambulatory Visit: Payer: Medicare Other | Admitting: Anesthesiology

## 2017-04-01 ENCOUNTER — Encounter
Admission: RE | Disposition: A | Discharge status: Home / Self Care | Payer: Self-pay | Source: Ambulatory Visit | Attending: Otolaryngology

## 2017-04-01 ENCOUNTER — Encounter: Payer: Self-pay | Admitting: Emergency Medicine

## 2017-04-01 ENCOUNTER — Ambulatory Visit
Admission: RE | Admit: 2017-04-01 | Discharge: 2017-04-01 | Disposition: A | Discharge status: Home / Self Care | Payer: Medicare Other | Source: Ambulatory Visit | Attending: Otolaryngology | Admitting: Otolaryngology

## 2017-04-01 DIAGNOSIS — Z79899 Other long term (current) drug therapy: Secondary | ICD-10-CM | POA: Diagnosis not present

## 2017-04-01 DIAGNOSIS — M1990 Unspecified osteoarthritis, unspecified site: Secondary | ICD-10-CM | POA: Insufficient documentation

## 2017-04-01 DIAGNOSIS — Z7982 Long term (current) use of aspirin: Secondary | ICD-10-CM | POA: Insufficient documentation

## 2017-04-01 DIAGNOSIS — R490 Dysphonia: Secondary | ICD-10-CM | POA: Diagnosis not present

## 2017-04-01 DIAGNOSIS — J387 Other diseases of larynx: Secondary | ICD-10-CM | POA: Diagnosis not present

## 2017-04-01 DIAGNOSIS — Z880 Allergy status to penicillin: Secondary | ICD-10-CM | POA: Diagnosis not present

## 2017-04-01 DIAGNOSIS — K219 Gastro-esophageal reflux disease without esophagitis: Secondary | ICD-10-CM | POA: Insufficient documentation

## 2017-04-01 DIAGNOSIS — J383 Other diseases of vocal cords: Secondary | ICD-10-CM | POA: Insufficient documentation

## 2017-04-01 DIAGNOSIS — Z87891 Personal history of nicotine dependence: Secondary | ICD-10-CM | POA: Diagnosis not present

## 2017-04-01 DIAGNOSIS — D141 Benign neoplasm of larynx: Secondary | ICD-10-CM | POA: Diagnosis not present

## 2017-04-01 HISTORY — PX: LARYNGOSCOPY: SHX5203

## 2017-04-01 SURGERY — LARYNGOSCOPY
Anesthesia: General | Laterality: Right | Wound class: Clean Contaminated

## 2017-04-01 MED ORDER — HYDROCODONE-ACETAMINOPHEN 5-325 MG PO TABS: 1.0000 | ORAL_TABLET | ORAL | 0 refills | Status: DC | PRN

## 2017-04-01 MED ORDER — MIDAZOLAM HCL 2 MG/2ML IJ SOLN
INTRAMUSCULAR | Status: DC | PRN
  Administered 2017-04-01: 1 mg via INTRAVENOUS

## 2017-04-01 MED ORDER — OXYMETAZOLINE HCL 0.05 % NA SOLN
NASAL | Status: AC
  Filled 2017-04-01: qty 15

## 2017-04-01 MED ORDER — DEXAMETHASONE SODIUM PHOSPHATE 10 MG/ML IJ SOLN
INTRAMUSCULAR | Status: DC | PRN
  Administered 2017-04-01: 15 mg via INTRAVENOUS

## 2017-04-01 MED ORDER — ROCURONIUM BROMIDE 100 MG/10ML IV SOLN
INTRAVENOUS | Status: DC | PRN
  Administered 2017-04-01: 30 mg via INTRAVENOUS

## 2017-04-01 MED ORDER — PROPOFOL 10 MG/ML IV BOLUS
INTRAVENOUS | Status: AC
  Filled 2017-04-01: qty 20

## 2017-04-01 MED ORDER — FENTANYL CITRATE (PF) 100 MCG/2ML IJ SOLN
INTRAMUSCULAR | Status: DC | PRN
  Administered 2017-04-01: 50 ug via INTRAVENOUS

## 2017-04-01 MED ORDER — DEXAMETHASONE SODIUM PHOSPHATE 10 MG/ML IJ SOLN
INTRAMUSCULAR | Status: AC
  Filled 2017-04-01: qty 1

## 2017-04-01 MED ORDER — ONDANSETRON HCL 4 MG/2ML IJ SOLN: 4.0000 mg | Freq: Once | INTRAMUSCULAR | Status: DC | PRN

## 2017-04-01 MED ORDER — FENTANYL CITRATE (PF) 100 MCG/2ML IJ SOLN
INTRAMUSCULAR | Status: AC
  Filled 2017-04-01: qty 2

## 2017-04-01 MED ORDER — SUGAMMADEX SODIUM 200 MG/2ML IV SOLN
INTRAVENOUS | Status: DC | PRN
  Administered 2017-04-01: 200 mg via INTRAVENOUS

## 2017-04-01 MED ORDER — ONDANSETRON HCL 4 MG/2ML IJ SOLN
INTRAMUSCULAR | Status: AC
  Filled 2017-04-01: qty 2

## 2017-04-01 MED ORDER — EPINEPHRINE 30 MG/30ML IJ SOLN
INTRAMUSCULAR | Status: AC
  Filled 2017-04-01: qty 1

## 2017-04-01 MED ORDER — LIDOCAINE HCL (CARDIAC) 20 MG/ML IV SOLN
INTRAVENOUS | Status: DC | PRN
  Administered 2017-04-01: 100 mg via INTRAVENOUS

## 2017-04-01 MED ORDER — MIDAZOLAM HCL 2 MG/2ML IJ SOLN
INTRAMUSCULAR | Status: AC
  Filled 2017-04-01: qty 2

## 2017-04-01 MED ORDER — LACTATED RINGERS IV SOLN
INTRAVENOUS | Status: DC
  Administered 2017-04-01: 11:00:00 via INTRAVENOUS

## 2017-04-01 MED ORDER — FENTANYL CITRATE (PF) 100 MCG/2ML IJ SOLN: 25.0000 ug | INTRAMUSCULAR | Status: DC | PRN

## 2017-04-01 MED ORDER — SUCCINYLCHOLINE CHLORIDE 20 MG/ML IJ SOLN
INTRAMUSCULAR | Status: DC | PRN
  Administered 2017-04-01: 100 mg via INTRAVENOUS

## 2017-04-01 MED ORDER — LIDOCAINE HCL (PF) 4 % IJ SOLN
INTRAMUSCULAR | Status: AC
  Filled 2017-04-01: qty 5

## 2017-04-01 MED ORDER — ONDANSETRON HCL 4 MG/2ML IJ SOLN
INTRAMUSCULAR | Status: DC | PRN
  Administered 2017-04-01: 4 mg via INTRAVENOUS

## 2017-04-01 MED ORDER — ONDANSETRON HCL 4 MG PO TABS: 4.0000 mg | ORAL_TABLET | Freq: Three times a day (TID) | ORAL | 0 refills | Status: DC | PRN

## 2017-04-01 MED ORDER — PROPOFOL 10 MG/ML IV BOLUS
INTRAVENOUS | Status: DC | PRN
  Administered 2017-04-01: 150 mg via INTRAVENOUS

## 2017-04-01 MED ORDER — LIDOCAINE HCL (PF) 2 % IJ SOLN
INTRAMUSCULAR | Status: AC
  Filled 2017-04-01: qty 10

## 2017-04-01 SURGICAL SUPPLY — 23 items
ATOMIZER TRACHEAL (MISCELLANEOUS) ×3 IMPLANT
BANDAGE EYE OVAL (MISCELLANEOUS) ×3 IMPLANT
BASIN GRAD PLASTIC 32OZ STRL (MISCELLANEOUS) ×3 IMPLANT
CUP MEDICINE 2OZ PLAST GRAD ST (MISCELLANEOUS) ×6 IMPLANT
DRAPE TABLE BACK 80X90 (DRAPES) ×3 IMPLANT
DRSG TELFA 4X3 1S NADH ST (GAUZE/BANDAGES/DRESSINGS) ×3 IMPLANT
GLOVE BIO SURGEON STRL SZ7.5 (GLOVE) ×3 IMPLANT
GOWN STRL REUS W/ TWL LRG LVL3 (GOWN DISPOSABLE) ×2 IMPLANT
GOWN STRL REUS W/TWL LRG LVL3 (GOWN DISPOSABLE) ×4
LABEL OR SOLS (LABEL) ×3 IMPLANT
MARKER SKIN DUAL TIP RULER LAB (MISCELLANEOUS) ×3 IMPLANT
NDL SAFETY 18GX1.5 (NEEDLE) ×3 IMPLANT
NEEDLE FILTER BLUNT 18X 1/2SAF (NEEDLE) ×4
NEEDLE FILTER BLUNT 18X1 1/2 (NEEDLE) ×2 IMPLANT
PATTIES SURGICAL .5 X.5 (GAUZE/BANDAGES/DRESSINGS) ×3 IMPLANT
SOL ANTI-FOG 6CC FOG-OUT (MISCELLANEOUS) ×1 IMPLANT
SOL FOG-OUT ANTI-FOG 6CC (MISCELLANEOUS) ×2
SPONGE XRAY 4X4 16PLY STRL (MISCELLANEOUS) ×3 IMPLANT
SYR 3ML LL SCALE MARK (SYRINGE) ×6 IMPLANT
SYRINGE 10CC LL (SYRINGE) ×3 IMPLANT
TOWEL OR 17X26 4PK STRL BLUE (TOWEL DISPOSABLE) ×3 IMPLANT
TUBING CONNECTING 10 (TUBING) ×2 IMPLANT
TUBING CONNECTING 10' (TUBING) ×1

## 2017-04-01 NOTE — Anesthesia Postprocedure Evaluation (Signed)
Anesthesia Post Note  Patient: Jeffrey Torres  Procedure(s) Performed: SUSPENSION LARYNGOSCOPY WITH MICROFLAP EXCISION (Right )  Patient location during evaluation: PACU Anesthesia Type: General Level of consciousness: awake and alert and oriented Pain management: pain level controlled Vital Signs Assessment: post-procedure vital signs reviewed and stable Respiratory status: spontaneous breathing, nonlabored ventilation and respiratory function stable Cardiovascular status: blood pressure returned to baseline and stable Postop Assessment: no signs of nausea or vomiting Anesthetic complications: no     Last Vitals:  Vitals:   04/01/17 1326 04/01/17 1331  BP: (!) 141/72 135/76  Pulse: 68   Resp: 11 16  Temp:  (!) 35.9 C  SpO2: 98% 100%    Last Pain:  Vitals:   04/01/17 1331  TempSrc: Temporal  PainSc: 0-No pain                 Keshona Kartes

## 2017-04-01 NOTE — Anesthesia Procedure Notes (Signed)
Procedure Name: Intubation Date/Time: 04/01/2017 12:03 PM Performed by: Darlyne Russian Pre-anesthesia Checklist: Patient identified, Emergency Drugs available, Suction available, Patient being monitored and Timeout performed Patient Re-evaluated:Patient Re-evaluated prior to induction Oxygen Delivery Method: Circle system utilized Preoxygenation: Pre-oxygenation with 100% oxygen Induction Type: IV induction Ventilation: Mask ventilation with difficulty and Oral airway inserted - appropriate to patient size Laryngoscope Size: McGraph and 4 Grade View: Grade II Tube type: Oral Number of attempts: 1 Placement Confirmation: ETT inserted through vocal cords under direct vision,  positive ETCO2 and breath sounds checked- equal and bilateral Secured at: 24 cm Tube secured with: Tape Dental Injury: Teeth and Oropharynx as per pre-operative assessment  Future Recommendations: Recommend- induction with short-acting agent, and alternative techniques readily available

## 2017-04-01 NOTE — Transfer of Care (Signed)
Immediate Anesthesia Transfer of Care Note  Patient: Jeffrey Torres  Procedure(s) Performed: SUSPENSION LARYNGOSCOPY WITH MICROFLAP EXCISION (Right )  Patient Location: PACU  Anesthesia Type:General  Level of Consciousness: awake, alert  and oriented  Airway & Oxygen Therapy: Patient Spontanous Breathing and Patient connected to nasal cannula oxygen  Post-op Assessment: Report given to RN and Post -op Vital signs reviewed and stable  Post vital signs: Reviewed and stable  Last Vitals:  Vitals:   04/01/17 1026 04/01/17 1240  BP: 140/78 134/64  Pulse: (!) 55 (!) 57  Resp: 17 18  Temp: (!) 35.6 C (!) 36.2 C  SpO2: 99% 98%    Last Pain:  Vitals:   04/01/17 1240  TempSrc: Temporal  PainSc: 0-No pain         Complications: No apparent anesthesia complications

## 2017-04-01 NOTE — Discharge Instructions (Signed)
AMBULATORY SURGERY  DISCHARGE INSTRUCTIONS   1) The drugs that you were given will stay in your system until tomorrow so for the next 24 hours you should not:  A) Drive an automobile B) Make any legal decisions C) Drink any alcoholic beverage   2) You may resume regular meals tomorrow.  Today it is better to start with liquids and gradually work up to solid foods.  You may eat anything you prefer, but it is better to start with liquids, then soup and crackers, and gradually work up to solid foods.   3) Please notify your doctor immediately if you have any unusual bleeding, trouble breathing, redness and pain at the surgery site, drainage, fever, or pain not relieved by medication.   4) Additional Instructions:

## 2017-04-01 NOTE — H&P (Signed)
..  History and Physical paper copy reviewed and updated date of procedure and will be scanned into system.  Patient seen and examined.  

## 2017-04-01 NOTE — Anesthesia Preprocedure Evaluation (Signed)
Anesthesia Evaluation  Patient identified by MRN, date of birth, ID band Patient awake    Reviewed: Allergy & Precautions, NPO status , Patient's Chart, lab work & pertinent test results  Airway Mallampati: II  TM Distance: >3 FB     Dental  (+) Teeth Intact   Pulmonary Current Smoker,    Pulmonary exam normal        Cardiovascular Normal cardiovascular exam+ dysrhythmias      Neuro/Psych negative neurological ROS  negative psych ROS   GI/Hepatic Neg liver ROS, GERD  Medicated and Controlled,  Endo/Other  negative endocrine ROS  Renal/GU stones     Musculoskeletal  (+) Arthritis , Osteoarthritis,    Abdominal Normal abdominal exam  (+)   Peds  Hematology negative hematology ROS (+)   Anesthesia Other Findings   Reproductive/Obstetrics                             Anesthesia Physical Anesthesia Plan  ASA: III  Anesthesia Plan: General   Post-op Pain Management:    Induction: Intravenous  PONV Risk Score and Plan:   Airway Management Planned: Oral ETT  Additional Equipment:   Intra-op Plan:   Post-operative Plan: Extubation in OR  Informed Consent: I have reviewed the patients History and Physical, chart, labs and discussed the procedure including the risks, benefits and alternatives for the proposed anesthesia with the patient or authorized representative who has indicated his/her understanding and acceptance.   Dental advisory given  Plan Discussed with: CRNA and Surgeon  Anesthesia Plan Comments:         Anesthesia Quick Evaluation

## 2017-04-01 NOTE — Anesthesia Post-op Follow-up Note (Signed)
Anesthesia QCDR form completed.        

## 2017-04-01 NOTE — Op Note (Signed)
....  04/01/2017  12:24 PM    Jeffrey Torres  [id number]   Pre-Op Dx:  Dysphonia, right true vocal fold lesion, history of tobacco abuse  Post-op Dx: same  Proc: Suspension Microlaryngoscopy with biopsy of right true vocal fold lesion  Surg:  Jeffrey Torres  Anes:  GOT  EBL:  <49ml  Comp:  none  Findings:  Firm exophytic mass arising from anterior 1/3 of right vocal fold appeared to be separate from anterior commisure.  Procedure: After the patient was identified in holding and the history and physical and consent was reviewed, the patient was taken to the operating room and placed in a supine position. General endotracheal anesthesia was induced in the normal fashion with a McGraff laryngoscope.  At this time, the patient was rotated 90 degrees and a shoulder roll was placed as well as well as a mouth guard.   At this time,Dedo laryngoscope was inserted into the patient's oral cavity. Visualization of the oral cavity, oropharynx, pharynx and larynx was made. This demonstrated an exophytic mass arising from the right anterior true vocal fold.  The anterior commisure was incompletely visualized so an anterior commisure scope was used to evaluate the mass. A magnified zero degree Hopkin's rod was used to evaluate the mass. Topical epinephrine on pledgets were placed against the lesion for approximately one minute. At this time, the anterior commisure laryngoscope was suspended from the Mayo stand.  Under high power magnification, a micro cup forcep was used to gently grasp the mass and multiple biopsies were taken from the mass.  Hemostasis was achieved with epi soaked pledgets.  The patient's entire airway was next evaluated for hemostasis and meticulous suctioning was performed. 0.25ccs of 4% lidocaine was sprayed onto the larynx. The patient was released from suspension and the mouth gag and laryngoscope was removed from the patient's oral cavity without injury to teeth, lips, or  gums.    Dispo:   To PACU in good condition  Plan:  Soft Diet, follow up in 1 week for repeat evaluation and review of pathology.  Jeffrey Torres  04/01/2017 12:24 PM

## 2017-04-02 LAB — SURGICAL PATHOLOGY

## 2017-04-08 DIAGNOSIS — F17200 Nicotine dependence, unspecified, uncomplicated: Secondary | ICD-10-CM | POA: Diagnosis not present

## 2017-04-08 DIAGNOSIS — C320 Malignant neoplasm of glottis: Secondary | ICD-10-CM | POA: Diagnosis not present

## 2017-04-08 DIAGNOSIS — K219 Gastro-esophageal reflux disease without esophagitis: Secondary | ICD-10-CM | POA: Diagnosis not present

## 2017-04-15 ENCOUNTER — Other Ambulatory Visit: Payer: Self-pay | Admitting: *Deleted

## 2017-04-15 ENCOUNTER — Ambulatory Visit
Admission: RE | Admit: 2017-04-15 | Discharge: 2017-04-15 | Disposition: A | Discharge status: Home / Self Care | Payer: Medicare Other | Source: Ambulatory Visit | Attending: Radiation Oncology | Admitting: Radiation Oncology

## 2017-04-15 ENCOUNTER — Encounter: Payer: Self-pay | Admitting: Radiation Oncology

## 2017-04-15 VITALS — BP 148/78 | HR 56 | Temp 98.0°F | Wt 218.4 lb

## 2017-04-15 DIAGNOSIS — K449 Diaphragmatic hernia without obstruction or gangrene: Secondary | ICD-10-CM | POA: Insufficient documentation

## 2017-04-15 DIAGNOSIS — Z7982 Long term (current) use of aspirin: Secondary | ICD-10-CM | POA: Insufficient documentation

## 2017-04-15 DIAGNOSIS — F17210 Nicotine dependence, cigarettes, uncomplicated: Secondary | ICD-10-CM | POA: Diagnosis not present

## 2017-04-15 DIAGNOSIS — Z510 Encounter for antineoplastic radiation therapy: Secondary | ICD-10-CM | POA: Insufficient documentation

## 2017-04-15 DIAGNOSIS — Z87442 Personal history of urinary calculi: Secondary | ICD-10-CM | POA: Insufficient documentation

## 2017-04-15 DIAGNOSIS — C320 Malignant neoplasm of glottis: Secondary | ICD-10-CM | POA: Diagnosis not present

## 2017-04-15 DIAGNOSIS — M129 Arthropathy, unspecified: Secondary | ICD-10-CM | POA: Insufficient documentation

## 2017-04-15 DIAGNOSIS — R490 Dysphonia: Secondary | ICD-10-CM | POA: Insufficient documentation

## 2017-04-15 DIAGNOSIS — Z923 Personal history of irradiation: Secondary | ICD-10-CM | POA: Diagnosis not present

## 2017-04-15 DIAGNOSIS — Z79899 Other long term (current) drug therapy: Secondary | ICD-10-CM | POA: Insufficient documentation

## 2017-04-15 DIAGNOSIS — Z801 Family history of malignant neoplasm of trachea, bronchus and lung: Secondary | ICD-10-CM | POA: Diagnosis not present

## 2017-04-15 MED ORDER — VARENICLINE TARTRATE 0.5 MG PO TABS: 0.5000 mg | ORAL_TABLET | Freq: Two times a day (BID) | ORAL | 0 refills | Status: DC

## 2017-04-15 MED ORDER — VARENICLINE TARTRATE 1 MG PO TABS: 1.0000 mg | ORAL_TABLET | Freq: Two times a day (BID) | ORAL | 0 refills | Status: DC

## 2017-04-15 NOTE — Consult Note (Signed)
NEW PATIENT EVALUATION  Name: Jeffrey Torres  MRN: [id number]  Date:   04/15/2017     DOB: 08/16/1945   This 71 y.o. 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  REFERRING PHYSICIAN: Birdie Sons, MD  CHIEF COMPLAINT:  Chief Complaint  Patient presents with  . Cancer    initial evaluation of radiation for Vocal Cord Cancer    DIAGNOSIS: The encounter diagnosis was Vocal cord cancer ([place]).   PREVIOUS INVESTIGATIONS:  CT scan of the head and neck ordered Pathology report reviewed Clinical notes reviewed  HPI: Patient is a 71 year old male who is presented with increasing hoarseness of his voice.Marland Kitchen He eventually sought ENT and underwent a micro-direct laryngoscopy. There was erythematous changes the right glottis and a mass noted on the rent right anterior true vocal cord. He had no evidence of neck adenopathy. Biopsy was positive for at least severe squamous dysplasia of the right anterior cord tangentially section sectioned cannot rule out squamous cell carcinoma. The lesion was mostly from the anterior one third of the right true cord. Patient is having no head and neck pain or dysphagia. He has requested help with smoking cessation. He is seen today for radiation oncology evaluation.  PLANNED TREATMENT REGIMEN: Radiation therapy to his larynx  PAST MEDICAL HISTORY:  has a past medical history of Arthritis; Dysrhythmia; GERD (gastroesophageal reflux disease); Glucosuria; Hernia, inguinal, bilateral; History of chicken pox; History of kidney stones; History of measles; History of mumps; Macular degeneration of both eyes; and PVC (premature ventricular contraction).    PAST SURGICAL HISTORY:  Past Surgical History:  Procedure Laterality Date  . CARDIAC CATHETERIZATION  2010   normal per patient report  . COLONOSCOPY  06/24/2006   Dr. Bary Castilla. Multiple benign appearing 29mm polyps in the cecum and in the rectum.  -Three 78mm polyps in the transversed colon, in the tranverse colon, proximal and in the distal transverse colon. Resected and retrieved.  . Kidney stones removed  2001   [place]  . LARYNGOSCOPY Right 04/01/2017   Procedure: SUSPENSION LARYNGOSCOPY WITH MICROFLAP EXCISION;  Surgeon: Carloyn Manner, MD;  Location: [facility] ORS;  Service: ENT;  Laterality: Right;  . sinus surgery   2002   [place]; removal of polyps  . TONSILLECTOMY  1954  . Tubular adenoma removed  06/24/2006    FAMILY HISTORY: family history includes Heart disease in his father; Lung cancer in his mother.  SOCIAL HISTORY:  reports that he has been smoking Cigarettes.  He has been smoking about 0.75 packs per day. He has never used smokeless tobacco. He reports that he drinks alcohol. He reports that he does not use drugs.  ALLERGIES: Penicillins  MEDICATIONS:  Current Outpatient Prescriptions  Medication Sig Dispense Refill  . aspirin 81 MG tablet Take 81 mg by mouth daily.    . fluticasone (FLONASE) 50 MCG/ACT nasal spray Place 2 sprays into both nostrils every morning.    Marland Kitchen HYDROcodone-acetaminophen (NORCO) 5-325 MG tablet Take 1 tablet by mouth every 4 (four) hours as needed for moderate pain. 40 tablet 0  . Multiple Vitamins-Minerals (PRESERVISION AREDS 2 PO) Take 2 capsules by mouth every morning.    Marland Kitchen omeprazole (PRILOSEC) 40 MG capsule Take 40 mg by mouth every morning. 30 MIN BEFORE EATING    . ondansetron (ZOFRAN) 4 MG tablet Take 1 tablet (4 mg total) by mouth every 8 (eight) hours as needed for nausea or vomiting. [place]  tablet 0   No current facility-administered medications for this encounter.     ECOG PERFORMANCE STATUS:  1 - Symptomatic but completely ambulatory  REVIEW OF SYSTEMS: Except for the hoarseness Patient denies any weight loss, fatigue, weakness, fever, chills or night sweats. Patient denies any loss of vision, blurred vision. Patient denies any ringing  of the ears or hearing loss. No irregular  heartbeat. Patient denies heart murmur or history of fainting. Patient denies any chest pain or pain radiating to her upper extremities. Patient denies any shortness of breath, difficulty breathing at night, cough or hemoptysis. Patient denies any swelling in the lower legs. Patient denies any nausea vomiting, vomiting of blood, or coffee ground material in the vomitus. Patient denies any stomach pain. Patient states has had normal bowel movements no significant constipation or diarrhea. Patient denies any dysuria, hematuria or significant nocturia. Patient denies any problems walking, swelling in the joints or loss of balance. Patient denies any skin changes, loss of hair or loss of weight. Patient denies any excessive worrying or anxiety or significant depression. Patient denies any problems with insomnia. Patient denies excessive thirst, polyuria, polydipsia. Patient denies any swollen glands, patient denies easy bruising or easy bleeding. Patient denies any recent infections, allergies or URI. Patient "s visual fields have not changed significantly in recent time.    PHYSICAL EXAM: BP (!) 148/78   Pulse (!) 56   Temp 98 F (36.7 C)   Wt 218 lb 5.9 oz (99 kg)   BMI 30.46 kg/m  Oral cavity is clear teeth are in good state of repair. Indirect mirror examination shows swelling around the cords not well-visualized. Vallecula and base of tongue within normal limits. Neck is clear without evidence of subject gastric cervical or supraclavicular adenopathy. Well-developed well-nourished patient in NAD. HEENT reveals PERLA, EOMI, discs not visualized.  Oral cavity is clear. No oral mucosal lesions are identified. Neck is clear without evidence of cervical or supraclavicular adenopathy. Lungs are clear to A&P. Cardiac examination is essentially unremarkable with regular rate and rhythm without murmur rub or thrill. Abdomen is benign with no organomegaly or masses noted. Motor sensory and DTR levels are equal  and symmetric in the upper and lower extremities. Cranial nerves II through XII are grossly intact. Proprioception is intact. No peripheral adenopathy or edema is identified. No motor or sensory levels are noted. Crude visual fields are within normal range.  LABORATORY DATA: Pathology reports reviewed    RADIOLOGY RESULTS: CT scan with contrast of the head and neck region ordered   IMPRESSION: Probable stage I squamous cell carcinoma the right true cord in 71 year old male  PLAN: At this time of ordering a CT scan with contrast of the head and neck region to rule out any possibility of lymph node involvement. I will plan on delivering 6600 cGy over 6-1/2 weeks to his true cords. Risks and benefits of treatment including temporary worsening of his hoarseness possible dysphasia possible skin reaction possible swelling of his neck all were described in detail to the patient and his wife. I have ordered a CT scan and will schedule CT simulation shortly thereafter. Patient also requested Chantix for smoking sensation which has been ordered. Patient seems to comprehend my treatment plan well.  I would like to take this opportunity to thank you for allowing me to participate in the care of your patient.Armstead Peaks., MD

## 2017-04-28 ENCOUNTER — Ambulatory Visit
Admission: RE | Admit: 2017-04-28 | Discharge: 2017-04-28 | Disposition: A | Discharge status: Home / Self Care | Payer: Medicare Other | Source: Ambulatory Visit | Attending: Radiation Oncology | Admitting: Radiation Oncology

## 2017-04-28 DIAGNOSIS — C320 Malignant neoplasm of glottis: Secondary | ICD-10-CM

## 2017-04-28 DIAGNOSIS — D141 Benign neoplasm of larynx: Secondary | ICD-10-CM | POA: Diagnosis not present

## 2017-04-28 HISTORY — DX: Malignant (primary) neoplasm, unspecified: C80.1

## 2017-04-28 LAB — POCT I-STAT CREATININE: CREATININE: 1.1 mg/dL (ref 0.61–1.24)

## 2017-04-28 MED ORDER — IOPAMIDOL (ISOVUE-300) INJECTION 61%
75.0000 mL | Freq: Once | INTRAVENOUS | Status: AC | PRN
Start: 2017-04-28 — End: 2017-04-28
  Administered 2017-04-28: 75 mL via INTRAVENOUS

## 2017-04-30 ENCOUNTER — Encounter (INDEPENDENT_AMBULATORY_CARE_PROVIDER_SITE_OTHER): Payer: Self-pay

## 2017-04-30 ENCOUNTER — Ambulatory Visit
Admission: RE | Admit: 2017-04-30 | Discharge: 2017-04-30 | Disposition: A | Discharge status: Home / Self Care | Payer: Medicare Other | Source: Ambulatory Visit | Attending: Radiation Oncology | Admitting: Radiation Oncology

## 2017-04-30 DIAGNOSIS — C320 Malignant neoplasm of glottis: Secondary | ICD-10-CM | POA: Diagnosis not present

## 2017-04-30 DIAGNOSIS — K449 Diaphragmatic hernia without obstruction or gangrene: Secondary | ICD-10-CM | POA: Diagnosis not present

## 2017-04-30 DIAGNOSIS — F17210 Nicotine dependence, cigarettes, uncomplicated: Secondary | ICD-10-CM | POA: Diagnosis not present

## 2017-04-30 DIAGNOSIS — R490 Dysphonia: Secondary | ICD-10-CM | POA: Diagnosis not present

## 2017-04-30 DIAGNOSIS — Z510 Encounter for antineoplastic radiation therapy: Secondary | ICD-10-CM | POA: Diagnosis not present

## 2017-04-30 DIAGNOSIS — M129 Arthropathy, unspecified: Secondary | ICD-10-CM | POA: Diagnosis not present

## 2017-05-03 DIAGNOSIS — H353221 Exudative age-related macular degeneration, left eye, with active choroidal neovascularization: Secondary | ICD-10-CM | POA: Diagnosis not present

## 2017-05-03 DIAGNOSIS — H353213 Exudative age-related macular degeneration, right eye, with inactive scar: Secondary | ICD-10-CM | POA: Diagnosis not present

## 2017-05-07 ENCOUNTER — Other Ambulatory Visit: Payer: Self-pay | Admitting: *Deleted

## 2017-05-07 DIAGNOSIS — C320 Malignant neoplasm of glottis: Secondary | ICD-10-CM | POA: Diagnosis not present

## 2017-05-07 DIAGNOSIS — F17210 Nicotine dependence, cigarettes, uncomplicated: Secondary | ICD-10-CM | POA: Diagnosis not present

## 2017-05-07 DIAGNOSIS — Z510 Encounter for antineoplastic radiation therapy: Secondary | ICD-10-CM | POA: Diagnosis not present

## 2017-05-07 DIAGNOSIS — M129 Arthropathy, unspecified: Secondary | ICD-10-CM | POA: Diagnosis not present

## 2017-05-07 DIAGNOSIS — K449 Diaphragmatic hernia without obstruction or gangrene: Secondary | ICD-10-CM | POA: Diagnosis not present

## 2017-05-07 DIAGNOSIS — R490 Dysphonia: Secondary | ICD-10-CM | POA: Diagnosis not present

## 2017-05-11 ENCOUNTER — Ambulatory Visit
Admission: RE | Admit: 2017-05-11 | Discharge: 2017-05-11 | Disposition: A | Discharge status: Home / Self Care | Payer: Medicare Other | Source: Ambulatory Visit | Attending: Radiation Oncology | Admitting: Radiation Oncology

## 2017-05-11 DIAGNOSIS — C320 Malignant neoplasm of glottis: Secondary | ICD-10-CM | POA: Diagnosis not present

## 2017-05-11 DIAGNOSIS — Z510 Encounter for antineoplastic radiation therapy: Secondary | ICD-10-CM | POA: Diagnosis not present

## 2017-05-11 DIAGNOSIS — F17210 Nicotine dependence, cigarettes, uncomplicated: Secondary | ICD-10-CM | POA: Diagnosis not present

## 2017-05-11 DIAGNOSIS — M129 Arthropathy, unspecified: Secondary | ICD-10-CM | POA: Diagnosis not present

## 2017-05-11 DIAGNOSIS — K449 Diaphragmatic hernia without obstruction or gangrene: Secondary | ICD-10-CM | POA: Diagnosis not present

## 2017-05-11 DIAGNOSIS — R490 Dysphonia: Secondary | ICD-10-CM | POA: Diagnosis not present

## 2017-05-12 ENCOUNTER — Ambulatory Visit
Admission: RE | Admit: 2017-05-12 | Discharge: 2017-05-12 | Disposition: A | Discharge status: Home / Self Care | Payer: Medicare Other | Source: Ambulatory Visit | Attending: Radiation Oncology | Admitting: Radiation Oncology

## 2017-05-12 DIAGNOSIS — R490 Dysphonia: Secondary | ICD-10-CM | POA: Diagnosis not present

## 2017-05-12 DIAGNOSIS — M129 Arthropathy, unspecified: Secondary | ICD-10-CM | POA: Diagnosis not present

## 2017-05-12 DIAGNOSIS — C320 Malignant neoplasm of glottis: Secondary | ICD-10-CM | POA: Diagnosis not present

## 2017-05-12 DIAGNOSIS — K449 Diaphragmatic hernia without obstruction or gangrene: Secondary | ICD-10-CM | POA: Diagnosis not present

## 2017-05-12 DIAGNOSIS — F17210 Nicotine dependence, cigarettes, uncomplicated: Secondary | ICD-10-CM | POA: Diagnosis not present

## 2017-05-12 DIAGNOSIS — Z510 Encounter for antineoplastic radiation therapy: Secondary | ICD-10-CM | POA: Diagnosis not present

## 2017-05-13 ENCOUNTER — Ambulatory Visit
Admission: RE | Admit: 2017-05-13 | Discharge: 2017-05-13 | Disposition: A | Discharge status: Home / Self Care | Payer: Medicare Other | Source: Ambulatory Visit | Attending: Radiation Oncology | Admitting: Radiation Oncology

## 2017-05-13 DIAGNOSIS — R490 Dysphonia: Secondary | ICD-10-CM | POA: Diagnosis not present

## 2017-05-13 DIAGNOSIS — M129 Arthropathy, unspecified: Secondary | ICD-10-CM | POA: Diagnosis not present

## 2017-05-13 DIAGNOSIS — Z510 Encounter for antineoplastic radiation therapy: Secondary | ICD-10-CM | POA: Diagnosis not present

## 2017-05-13 DIAGNOSIS — K449 Diaphragmatic hernia without obstruction or gangrene: Secondary | ICD-10-CM | POA: Diagnosis not present

## 2017-05-13 DIAGNOSIS — F17210 Nicotine dependence, cigarettes, uncomplicated: Secondary | ICD-10-CM | POA: Diagnosis not present

## 2017-05-13 DIAGNOSIS — C320 Malignant neoplasm of glottis: Secondary | ICD-10-CM | POA: Diagnosis not present

## 2017-05-14 ENCOUNTER — Ambulatory Visit
Admission: RE | Admit: 2017-05-14 | Discharge: 2017-05-14 | Disposition: A | Discharge status: Home / Self Care | Payer: Medicare Other | Source: Ambulatory Visit | Attending: Radiation Oncology | Admitting: Radiation Oncology

## 2017-05-14 DIAGNOSIS — R490 Dysphonia: Secondary | ICD-10-CM | POA: Diagnosis not present

## 2017-05-14 DIAGNOSIS — F17210 Nicotine dependence, cigarettes, uncomplicated: Secondary | ICD-10-CM | POA: Diagnosis not present

## 2017-05-14 DIAGNOSIS — C320 Malignant neoplasm of glottis: Secondary | ICD-10-CM | POA: Diagnosis not present

## 2017-05-14 DIAGNOSIS — Z510 Encounter for antineoplastic radiation therapy: Secondary | ICD-10-CM | POA: Diagnosis not present

## 2017-05-14 DIAGNOSIS — K449 Diaphragmatic hernia without obstruction or gangrene: Secondary | ICD-10-CM | POA: Diagnosis not present

## 2017-05-14 DIAGNOSIS — M129 Arthropathy, unspecified: Secondary | ICD-10-CM | POA: Diagnosis not present

## 2017-05-17 ENCOUNTER — Ambulatory Visit
Admission: RE | Admit: 2017-05-17 | Discharge: 2017-05-17 | Disposition: A | Discharge status: Home / Self Care | Payer: Medicare Other | Source: Ambulatory Visit | Attending: Radiation Oncology | Admitting: Radiation Oncology

## 2017-05-17 DIAGNOSIS — C320 Malignant neoplasm of glottis: Secondary | ICD-10-CM | POA: Diagnosis not present

## 2017-05-17 DIAGNOSIS — R490 Dysphonia: Secondary | ICD-10-CM | POA: Diagnosis not present

## 2017-05-17 DIAGNOSIS — F17210 Nicotine dependence, cigarettes, uncomplicated: Secondary | ICD-10-CM | POA: Diagnosis not present

## 2017-05-17 DIAGNOSIS — Z510 Encounter for antineoplastic radiation therapy: Secondary | ICD-10-CM | POA: Diagnosis not present

## 2017-05-17 DIAGNOSIS — K449 Diaphragmatic hernia without obstruction or gangrene: Secondary | ICD-10-CM | POA: Diagnosis not present

## 2017-05-17 DIAGNOSIS — M129 Arthropathy, unspecified: Secondary | ICD-10-CM | POA: Diagnosis not present

## 2017-05-18 ENCOUNTER — Ambulatory Visit
Admission: RE | Admit: 2017-05-18 | Discharge: 2017-05-18 | Disposition: A | Discharge status: Home / Self Care | Payer: Medicare Other | Source: Ambulatory Visit | Attending: Radiation Oncology | Admitting: Radiation Oncology

## 2017-05-18 DIAGNOSIS — M129 Arthropathy, unspecified: Secondary | ICD-10-CM | POA: Diagnosis not present

## 2017-05-18 DIAGNOSIS — R490 Dysphonia: Secondary | ICD-10-CM | POA: Diagnosis not present

## 2017-05-18 DIAGNOSIS — C320 Malignant neoplasm of glottis: Secondary | ICD-10-CM | POA: Diagnosis not present

## 2017-05-18 DIAGNOSIS — F17210 Nicotine dependence, cigarettes, uncomplicated: Secondary | ICD-10-CM | POA: Diagnosis not present

## 2017-05-18 DIAGNOSIS — K449 Diaphragmatic hernia without obstruction or gangrene: Secondary | ICD-10-CM | POA: Diagnosis not present

## 2017-05-18 DIAGNOSIS — Z510 Encounter for antineoplastic radiation therapy: Secondary | ICD-10-CM | POA: Diagnosis not present

## 2017-05-19 ENCOUNTER — Inpatient Hospital Stay: Payer: Medicare Other

## 2017-05-19 ENCOUNTER — Ambulatory Visit
Admission: RE | Admit: 2017-05-19 | Discharge: 2017-05-19 | Disposition: A | Discharge status: Home / Self Care | Payer: Medicare Other | Source: Ambulatory Visit | Attending: Radiation Oncology | Admitting: Radiation Oncology

## 2017-05-19 DIAGNOSIS — M129 Arthropathy, unspecified: Secondary | ICD-10-CM | POA: Diagnosis not present

## 2017-05-19 DIAGNOSIS — R490 Dysphonia: Secondary | ICD-10-CM | POA: Diagnosis not present

## 2017-05-19 DIAGNOSIS — Z510 Encounter for antineoplastic radiation therapy: Secondary | ICD-10-CM | POA: Diagnosis not present

## 2017-05-19 DIAGNOSIS — F17210 Nicotine dependence, cigarettes, uncomplicated: Secondary | ICD-10-CM | POA: Diagnosis not present

## 2017-05-19 DIAGNOSIS — K449 Diaphragmatic hernia without obstruction or gangrene: Secondary | ICD-10-CM | POA: Diagnosis not present

## 2017-05-19 DIAGNOSIS — C320 Malignant neoplasm of glottis: Secondary | ICD-10-CM | POA: Diagnosis not present

## 2017-05-24 ENCOUNTER — Ambulatory Visit
Admission: RE | Admit: 2017-05-24 | Discharge: 2017-05-24 | Disposition: A | Discharge status: Home / Self Care | Payer: Medicare Other | Source: Ambulatory Visit | Attending: Radiation Oncology | Admitting: Radiation Oncology

## 2017-05-24 DIAGNOSIS — Z510 Encounter for antineoplastic radiation therapy: Secondary | ICD-10-CM | POA: Diagnosis not present

## 2017-05-24 DIAGNOSIS — M129 Arthropathy, unspecified: Secondary | ICD-10-CM | POA: Diagnosis not present

## 2017-05-24 DIAGNOSIS — K449 Diaphragmatic hernia without obstruction or gangrene: Secondary | ICD-10-CM | POA: Diagnosis not present

## 2017-05-24 DIAGNOSIS — K219 Gastro-esophageal reflux disease without esophagitis: Secondary | ICD-10-CM | POA: Diagnosis not present

## 2017-05-24 DIAGNOSIS — R490 Dysphonia: Secondary | ICD-10-CM | POA: Diagnosis not present

## 2017-05-24 DIAGNOSIS — C329 Malignant neoplasm of larynx, unspecified: Secondary | ICD-10-CM | POA: Diagnosis not present

## 2017-05-24 DIAGNOSIS — C320 Malignant neoplasm of glottis: Secondary | ICD-10-CM | POA: Diagnosis not present

## 2017-05-24 DIAGNOSIS — F17210 Nicotine dependence, cigarettes, uncomplicated: Secondary | ICD-10-CM | POA: Diagnosis not present

## 2017-05-24 DIAGNOSIS — J301 Allergic rhinitis due to pollen: Secondary | ICD-10-CM | POA: Diagnosis not present

## 2017-05-25 ENCOUNTER — Other Ambulatory Visit: Payer: Medicare Other

## 2017-05-25 ENCOUNTER — Ambulatory Visit
Admission: RE | Admit: 2017-05-25 | Discharge: 2017-05-25 | Disposition: A | Discharge status: Home / Self Care | Payer: Medicare Other | Source: Ambulatory Visit | Attending: Radiation Oncology | Admitting: Radiation Oncology

## 2017-05-25 DIAGNOSIS — M129 Arthropathy, unspecified: Secondary | ICD-10-CM | POA: Diagnosis not present

## 2017-05-25 DIAGNOSIS — F17210 Nicotine dependence, cigarettes, uncomplicated: Secondary | ICD-10-CM | POA: Diagnosis not present

## 2017-05-25 DIAGNOSIS — Z510 Encounter for antineoplastic radiation therapy: Secondary | ICD-10-CM | POA: Diagnosis not present

## 2017-05-25 DIAGNOSIS — K449 Diaphragmatic hernia without obstruction or gangrene: Secondary | ICD-10-CM | POA: Diagnosis not present

## 2017-05-25 DIAGNOSIS — R490 Dysphonia: Secondary | ICD-10-CM | POA: Diagnosis not present

## 2017-05-25 DIAGNOSIS — C320 Malignant neoplasm of glottis: Secondary | ICD-10-CM | POA: Diagnosis not present

## 2017-05-26 ENCOUNTER — Ambulatory Visit
Admission: RE | Admit: 2017-05-26 | Discharge: 2017-05-26 | Disposition: A | Discharge status: Home / Self Care | Payer: Medicare Other | Source: Ambulatory Visit | Attending: Radiation Oncology | Admitting: Radiation Oncology

## 2017-05-26 ENCOUNTER — Inpatient Hospital Stay: Payer: Medicare Other | Attending: Radiation Oncology

## 2017-05-26 DIAGNOSIS — R490 Dysphonia: Secondary | ICD-10-CM | POA: Insufficient documentation

## 2017-05-26 DIAGNOSIS — Z510 Encounter for antineoplastic radiation therapy: Secondary | ICD-10-CM | POA: Diagnosis not present

## 2017-05-26 DIAGNOSIS — C320 Malignant neoplasm of glottis: Secondary | ICD-10-CM | POA: Insufficient documentation

## 2017-05-26 DIAGNOSIS — F17210 Nicotine dependence, cigarettes, uncomplicated: Secondary | ICD-10-CM | POA: Diagnosis not present

## 2017-05-26 DIAGNOSIS — M129 Arthropathy, unspecified: Secondary | ICD-10-CM | POA: Diagnosis not present

## 2017-05-26 DIAGNOSIS — K449 Diaphragmatic hernia without obstruction or gangrene: Secondary | ICD-10-CM | POA: Diagnosis not present

## 2017-05-26 DIAGNOSIS — Z79899 Other long term (current) drug therapy: Secondary | ICD-10-CM | POA: Diagnosis not present

## 2017-05-26 DIAGNOSIS — Z923 Personal history of irradiation: Secondary | ICD-10-CM | POA: Insufficient documentation

## 2017-05-26 LAB — CBC
HEMATOCRIT: 44.1 % (ref 40.0–52.0)
HEMOGLOBIN: 15 g/dL (ref 13.0–18.0)
MCH: 31.2 pg (ref 26.0–34.0)
MCHC: 34.1 g/dL (ref 32.0–36.0)
MCV: 91.5 fL (ref 80.0–100.0)
Platelets: 208 10*3/uL (ref 150–440)
RBC: 4.82 MIL/uL (ref 4.40–5.90)
RDW: 13.6 % (ref 11.5–14.5)
WBC: 6.4 10*3/uL (ref 3.8–10.6)

## 2017-05-27 ENCOUNTER — Ambulatory Visit
Admission: RE | Admit: 2017-05-27 | Discharge: 2017-05-27 | Disposition: A | Discharge status: Home / Self Care | Payer: Medicare Other | Source: Ambulatory Visit | Attending: Radiation Oncology | Admitting: Radiation Oncology

## 2017-05-27 DIAGNOSIS — C320 Malignant neoplasm of glottis: Secondary | ICD-10-CM | POA: Diagnosis not present

## 2017-05-27 DIAGNOSIS — K449 Diaphragmatic hernia without obstruction or gangrene: Secondary | ICD-10-CM | POA: Diagnosis not present

## 2017-05-27 DIAGNOSIS — M129 Arthropathy, unspecified: Secondary | ICD-10-CM | POA: Diagnosis not present

## 2017-05-27 DIAGNOSIS — R490 Dysphonia: Secondary | ICD-10-CM | POA: Diagnosis not present

## 2017-05-27 DIAGNOSIS — F17210 Nicotine dependence, cigarettes, uncomplicated: Secondary | ICD-10-CM | POA: Diagnosis not present

## 2017-05-27 DIAGNOSIS — Z510 Encounter for antineoplastic radiation therapy: Secondary | ICD-10-CM | POA: Diagnosis not present

## 2017-05-28 ENCOUNTER — Ambulatory Visit
Admission: RE | Admit: 2017-05-28 | Discharge: 2017-05-28 | Disposition: A | Discharge status: Home / Self Care | Payer: Medicare Other | Source: Ambulatory Visit | Attending: Radiation Oncology | Admitting: Radiation Oncology

## 2017-05-28 DIAGNOSIS — Z510 Encounter for antineoplastic radiation therapy: Secondary | ICD-10-CM | POA: Diagnosis not present

## 2017-05-28 DIAGNOSIS — M129 Arthropathy, unspecified: Secondary | ICD-10-CM | POA: Diagnosis not present

## 2017-05-28 DIAGNOSIS — R490 Dysphonia: Secondary | ICD-10-CM | POA: Diagnosis not present

## 2017-05-28 DIAGNOSIS — C320 Malignant neoplasm of glottis: Secondary | ICD-10-CM | POA: Diagnosis not present

## 2017-05-28 DIAGNOSIS — F17210 Nicotine dependence, cigarettes, uncomplicated: Secondary | ICD-10-CM | POA: Diagnosis not present

## 2017-05-28 DIAGNOSIS — K449 Diaphragmatic hernia without obstruction or gangrene: Secondary | ICD-10-CM | POA: Diagnosis not present

## 2017-05-31 ENCOUNTER — Ambulatory Visit
Admission: RE | Admit: 2017-05-31 | Discharge: 2017-05-31 | Disposition: A | Discharge status: Home / Self Care | Payer: Medicare Other | Source: Ambulatory Visit | Attending: Radiation Oncology | Admitting: Radiation Oncology

## 2017-05-31 DIAGNOSIS — C320 Malignant neoplasm of glottis: Secondary | ICD-10-CM | POA: Diagnosis not present

## 2017-05-31 DIAGNOSIS — F17210 Nicotine dependence, cigarettes, uncomplicated: Secondary | ICD-10-CM | POA: Diagnosis not present

## 2017-05-31 DIAGNOSIS — M129 Arthropathy, unspecified: Secondary | ICD-10-CM | POA: Diagnosis not present

## 2017-05-31 DIAGNOSIS — R490 Dysphonia: Secondary | ICD-10-CM | POA: Diagnosis not present

## 2017-05-31 DIAGNOSIS — K449 Diaphragmatic hernia without obstruction or gangrene: Secondary | ICD-10-CM | POA: Diagnosis not present

## 2017-05-31 DIAGNOSIS — Z510 Encounter for antineoplastic radiation therapy: Secondary | ICD-10-CM | POA: Diagnosis not present

## 2017-06-01 ENCOUNTER — Ambulatory Visit
Admission: RE | Admit: 2017-06-01 | Discharge: 2017-06-01 | Disposition: A | Discharge status: Home / Self Care | Payer: Medicare Other | Source: Ambulatory Visit | Attending: Radiation Oncology | Admitting: Radiation Oncology

## 2017-06-01 DIAGNOSIS — Z510 Encounter for antineoplastic radiation therapy: Secondary | ICD-10-CM | POA: Diagnosis not present

## 2017-06-01 DIAGNOSIS — K449 Diaphragmatic hernia without obstruction or gangrene: Secondary | ICD-10-CM | POA: Diagnosis not present

## 2017-06-01 DIAGNOSIS — C320 Malignant neoplasm of glottis: Secondary | ICD-10-CM | POA: Diagnosis not present

## 2017-06-01 DIAGNOSIS — M129 Arthropathy, unspecified: Secondary | ICD-10-CM | POA: Diagnosis not present

## 2017-06-01 DIAGNOSIS — F17210 Nicotine dependence, cigarettes, uncomplicated: Secondary | ICD-10-CM | POA: Diagnosis not present

## 2017-06-01 DIAGNOSIS — R490 Dysphonia: Secondary | ICD-10-CM | POA: Diagnosis not present

## 2017-06-02 ENCOUNTER — Inpatient Hospital Stay: Payer: Medicare Other | Attending: Radiation Oncology

## 2017-06-02 ENCOUNTER — Ambulatory Visit
Admission: RE | Admit: 2017-06-02 | Discharge: 2017-06-02 | Disposition: A | Discharge status: Home / Self Care | Payer: Medicare Other | Source: Ambulatory Visit | Attending: Radiation Oncology | Admitting: Radiation Oncology

## 2017-06-02 DIAGNOSIS — F17210 Nicotine dependence, cigarettes, uncomplicated: Secondary | ICD-10-CM | POA: Diagnosis not present

## 2017-06-02 DIAGNOSIS — Z923 Personal history of irradiation: Secondary | ICD-10-CM | POA: Diagnosis not present

## 2017-06-02 DIAGNOSIS — R490 Dysphonia: Secondary | ICD-10-CM | POA: Insufficient documentation

## 2017-06-02 DIAGNOSIS — Z79899 Other long term (current) drug therapy: Secondary | ICD-10-CM | POA: Diagnosis not present

## 2017-06-02 DIAGNOSIS — M129 Arthropathy, unspecified: Secondary | ICD-10-CM | POA: Diagnosis not present

## 2017-06-02 DIAGNOSIS — K449 Diaphragmatic hernia without obstruction or gangrene: Secondary | ICD-10-CM | POA: Diagnosis not present

## 2017-06-02 DIAGNOSIS — C320 Malignant neoplasm of glottis: Secondary | ICD-10-CM | POA: Diagnosis not present

## 2017-06-02 DIAGNOSIS — Z510 Encounter for antineoplastic radiation therapy: Secondary | ICD-10-CM | POA: Diagnosis not present

## 2017-06-02 LAB — CBC
HEMATOCRIT: 43.6 % (ref 40.0–52.0)
HEMOGLOBIN: 15.1 g/dL (ref 13.0–18.0)
MCH: 31.7 pg (ref 26.0–34.0)
MCHC: 34.7 g/dL (ref 32.0–36.0)
MCV: 91.3 fL (ref 80.0–100.0)
Platelets: 214 10*3/uL (ref 150–440)
RBC: 4.77 MIL/uL (ref 4.40–5.90)
RDW: 13.3 % (ref 11.5–14.5)
WBC: 6.3 10*3/uL (ref 3.8–10.6)

## 2017-06-03 ENCOUNTER — Ambulatory Visit
Admission: RE | Admit: 2017-06-03 | Discharge: 2017-06-03 | Disposition: A | Discharge status: Home / Self Care | Payer: Medicare Other | Source: Ambulatory Visit | Attending: Radiation Oncology | Admitting: Radiation Oncology

## 2017-06-03 DIAGNOSIS — F17210 Nicotine dependence, cigarettes, uncomplicated: Secondary | ICD-10-CM | POA: Diagnosis not present

## 2017-06-03 DIAGNOSIS — C320 Malignant neoplasm of glottis: Secondary | ICD-10-CM | POA: Diagnosis not present

## 2017-06-03 DIAGNOSIS — R490 Dysphonia: Secondary | ICD-10-CM | POA: Diagnosis not present

## 2017-06-03 DIAGNOSIS — K449 Diaphragmatic hernia without obstruction or gangrene: Secondary | ICD-10-CM | POA: Diagnosis not present

## 2017-06-03 DIAGNOSIS — M129 Arthropathy, unspecified: Secondary | ICD-10-CM | POA: Diagnosis not present

## 2017-06-03 DIAGNOSIS — Z510 Encounter for antineoplastic radiation therapy: Secondary | ICD-10-CM | POA: Diagnosis not present

## 2017-06-04 ENCOUNTER — Ambulatory Visit
Admission: RE | Admit: 2017-06-04 | Discharge: 2017-06-04 | Disposition: A | Discharge status: Home / Self Care | Payer: Medicare Other | Source: Ambulatory Visit | Attending: Radiation Oncology | Admitting: Radiation Oncology

## 2017-06-04 DIAGNOSIS — C320 Malignant neoplasm of glottis: Secondary | ICD-10-CM | POA: Diagnosis not present

## 2017-06-04 DIAGNOSIS — K449 Diaphragmatic hernia without obstruction or gangrene: Secondary | ICD-10-CM | POA: Diagnosis not present

## 2017-06-04 DIAGNOSIS — Z510 Encounter for antineoplastic radiation therapy: Secondary | ICD-10-CM | POA: Diagnosis not present

## 2017-06-04 DIAGNOSIS — R490 Dysphonia: Secondary | ICD-10-CM | POA: Diagnosis not present

## 2017-06-04 DIAGNOSIS — F17210 Nicotine dependence, cigarettes, uncomplicated: Secondary | ICD-10-CM | POA: Diagnosis not present

## 2017-06-04 DIAGNOSIS — M129 Arthropathy, unspecified: Secondary | ICD-10-CM | POA: Diagnosis not present

## 2017-06-07 ENCOUNTER — Ambulatory Visit: Payer: Medicare Other

## 2017-06-08 ENCOUNTER — Ambulatory Visit
Admission: RE | Admit: 2017-06-08 | Discharge: 2017-06-08 | Disposition: A | Discharge status: Home / Self Care | Payer: Medicare Other | Source: Ambulatory Visit | Attending: Radiation Oncology | Admitting: Radiation Oncology

## 2017-06-08 DIAGNOSIS — K449 Diaphragmatic hernia without obstruction or gangrene: Secondary | ICD-10-CM | POA: Diagnosis not present

## 2017-06-08 DIAGNOSIS — F17210 Nicotine dependence, cigarettes, uncomplicated: Secondary | ICD-10-CM | POA: Diagnosis not present

## 2017-06-08 DIAGNOSIS — R490 Dysphonia: Secondary | ICD-10-CM | POA: Diagnosis not present

## 2017-06-08 DIAGNOSIS — Z510 Encounter for antineoplastic radiation therapy: Secondary | ICD-10-CM | POA: Diagnosis not present

## 2017-06-08 DIAGNOSIS — C320 Malignant neoplasm of glottis: Secondary | ICD-10-CM | POA: Diagnosis not present

## 2017-06-08 DIAGNOSIS — M129 Arthropathy, unspecified: Secondary | ICD-10-CM | POA: Diagnosis not present

## 2017-06-09 ENCOUNTER — Inpatient Hospital Stay: Payer: Medicare Other

## 2017-06-09 ENCOUNTER — Ambulatory Visit
Admission: RE | Admit: 2017-06-09 | Discharge: 2017-06-09 | Disposition: A | Discharge status: Home / Self Care | Payer: Medicare Other | Source: Ambulatory Visit | Attending: Radiation Oncology | Admitting: Radiation Oncology

## 2017-06-09 DIAGNOSIS — K449 Diaphragmatic hernia without obstruction or gangrene: Secondary | ICD-10-CM | POA: Diagnosis not present

## 2017-06-09 DIAGNOSIS — Z923 Personal history of irradiation: Secondary | ICD-10-CM | POA: Diagnosis not present

## 2017-06-09 DIAGNOSIS — C320 Malignant neoplasm of glottis: Secondary | ICD-10-CM | POA: Diagnosis not present

## 2017-06-09 DIAGNOSIS — F17210 Nicotine dependence, cigarettes, uncomplicated: Secondary | ICD-10-CM | POA: Diagnosis not present

## 2017-06-09 DIAGNOSIS — M129 Arthropathy, unspecified: Secondary | ICD-10-CM | POA: Diagnosis not present

## 2017-06-09 DIAGNOSIS — Z510 Encounter for antineoplastic radiation therapy: Secondary | ICD-10-CM | POA: Diagnosis not present

## 2017-06-09 DIAGNOSIS — Z79899 Other long term (current) drug therapy: Secondary | ICD-10-CM | POA: Diagnosis not present

## 2017-06-09 DIAGNOSIS — R490 Dysphonia: Secondary | ICD-10-CM | POA: Diagnosis not present

## 2017-06-09 LAB — CBC
HEMATOCRIT: 42.5 % (ref 40.0–52.0)
Hemoglobin: 14.4 g/dL (ref 13.0–18.0)
MCH: 31.2 pg (ref 26.0–34.0)
MCHC: 33.9 g/dL (ref 32.0–36.0)
MCV: 92.2 fL (ref 80.0–100.0)
PLATELETS: 219 10*3/uL (ref 150–440)
RBC: 4.62 MIL/uL (ref 4.40–5.90)
RDW: 13.2 % (ref 11.5–14.5)
WBC: 5.4 10*3/uL (ref 3.8–10.6)

## 2017-06-10 ENCOUNTER — Ambulatory Visit
Admission: RE | Admit: 2017-06-10 | Discharge: 2017-06-10 | Disposition: A | Discharge status: Home / Self Care | Payer: Medicare Other | Source: Ambulatory Visit | Attending: Radiation Oncology | Admitting: Radiation Oncology

## 2017-06-10 DIAGNOSIS — F17210 Nicotine dependence, cigarettes, uncomplicated: Secondary | ICD-10-CM | POA: Diagnosis not present

## 2017-06-10 DIAGNOSIS — R490 Dysphonia: Secondary | ICD-10-CM | POA: Diagnosis not present

## 2017-06-10 DIAGNOSIS — K449 Diaphragmatic hernia without obstruction or gangrene: Secondary | ICD-10-CM | POA: Diagnosis not present

## 2017-06-10 DIAGNOSIS — Z510 Encounter for antineoplastic radiation therapy: Secondary | ICD-10-CM | POA: Diagnosis not present

## 2017-06-10 DIAGNOSIS — M129 Arthropathy, unspecified: Secondary | ICD-10-CM | POA: Diagnosis not present

## 2017-06-10 DIAGNOSIS — C320 Malignant neoplasm of glottis: Secondary | ICD-10-CM | POA: Diagnosis not present

## 2017-06-11 ENCOUNTER — Ambulatory Visit
Admission: RE | Admit: 2017-06-11 | Discharge: 2017-06-11 | Disposition: A | Discharge status: Home / Self Care | Payer: Medicare Other | Source: Ambulatory Visit | Attending: Radiation Oncology | Admitting: Radiation Oncology

## 2017-06-11 DIAGNOSIS — Z510 Encounter for antineoplastic radiation therapy: Secondary | ICD-10-CM | POA: Diagnosis not present

## 2017-06-11 DIAGNOSIS — R490 Dysphonia: Secondary | ICD-10-CM | POA: Diagnosis not present

## 2017-06-11 DIAGNOSIS — C320 Malignant neoplasm of glottis: Secondary | ICD-10-CM | POA: Diagnosis not present

## 2017-06-11 DIAGNOSIS — K449 Diaphragmatic hernia without obstruction or gangrene: Secondary | ICD-10-CM | POA: Diagnosis not present

## 2017-06-11 DIAGNOSIS — M129 Arthropathy, unspecified: Secondary | ICD-10-CM | POA: Diagnosis not present

## 2017-06-11 DIAGNOSIS — F17210 Nicotine dependence, cigarettes, uncomplicated: Secondary | ICD-10-CM | POA: Diagnosis not present

## 2017-06-14 ENCOUNTER — Ambulatory Visit
Admission: RE | Admit: 2017-06-14 | Discharge: 2017-06-14 | Disposition: A | Discharge status: Home / Self Care | Payer: Medicare Other | Source: Ambulatory Visit | Attending: Radiation Oncology | Admitting: Radiation Oncology

## 2017-06-14 DIAGNOSIS — C320 Malignant neoplasm of glottis: Secondary | ICD-10-CM | POA: Diagnosis not present

## 2017-06-14 DIAGNOSIS — R490 Dysphonia: Secondary | ICD-10-CM | POA: Diagnosis not present

## 2017-06-14 DIAGNOSIS — K449 Diaphragmatic hernia without obstruction or gangrene: Secondary | ICD-10-CM | POA: Diagnosis not present

## 2017-06-14 DIAGNOSIS — Z510 Encounter for antineoplastic radiation therapy: Secondary | ICD-10-CM | POA: Diagnosis not present

## 2017-06-14 DIAGNOSIS — M129 Arthropathy, unspecified: Secondary | ICD-10-CM | POA: Diagnosis not present

## 2017-06-14 DIAGNOSIS — F17210 Nicotine dependence, cigarettes, uncomplicated: Secondary | ICD-10-CM | POA: Diagnosis not present

## 2017-06-15 ENCOUNTER — Ambulatory Visit
Admission: RE | Admit: 2017-06-15 | Discharge: 2017-06-15 | Disposition: A | Discharge status: Home / Self Care | Payer: Medicare Other | Source: Ambulatory Visit | Attending: Radiation Oncology | Admitting: Radiation Oncology

## 2017-06-15 DIAGNOSIS — Z510 Encounter for antineoplastic radiation therapy: Secondary | ICD-10-CM | POA: Diagnosis not present

## 2017-06-15 DIAGNOSIS — K449 Diaphragmatic hernia without obstruction or gangrene: Secondary | ICD-10-CM | POA: Diagnosis not present

## 2017-06-15 DIAGNOSIS — M129 Arthropathy, unspecified: Secondary | ICD-10-CM | POA: Diagnosis not present

## 2017-06-15 DIAGNOSIS — R490 Dysphonia: Secondary | ICD-10-CM | POA: Diagnosis not present

## 2017-06-15 DIAGNOSIS — F17210 Nicotine dependence, cigarettes, uncomplicated: Secondary | ICD-10-CM | POA: Diagnosis not present

## 2017-06-15 DIAGNOSIS — C320 Malignant neoplasm of glottis: Secondary | ICD-10-CM | POA: Diagnosis not present

## 2017-06-16 ENCOUNTER — Inpatient Hospital Stay: Payer: Medicare Other

## 2017-06-16 ENCOUNTER — Ambulatory Visit
Admission: RE | Admit: 2017-06-16 | Discharge: 2017-06-16 | Disposition: A | Discharge status: Home / Self Care | Payer: Medicare Other | Source: Ambulatory Visit | Attending: Radiation Oncology | Admitting: Radiation Oncology

## 2017-06-16 DIAGNOSIS — M129 Arthropathy, unspecified: Secondary | ICD-10-CM | POA: Diagnosis not present

## 2017-06-16 DIAGNOSIS — K449 Diaphragmatic hernia without obstruction or gangrene: Secondary | ICD-10-CM | POA: Diagnosis not present

## 2017-06-16 DIAGNOSIS — C320 Malignant neoplasm of glottis: Secondary | ICD-10-CM | POA: Diagnosis not present

## 2017-06-16 DIAGNOSIS — Z510 Encounter for antineoplastic radiation therapy: Secondary | ICD-10-CM | POA: Diagnosis not present

## 2017-06-16 DIAGNOSIS — Z79899 Other long term (current) drug therapy: Secondary | ICD-10-CM | POA: Diagnosis not present

## 2017-06-16 DIAGNOSIS — Z923 Personal history of irradiation: Secondary | ICD-10-CM | POA: Diagnosis not present

## 2017-06-16 DIAGNOSIS — R490 Dysphonia: Secondary | ICD-10-CM | POA: Diagnosis not present

## 2017-06-16 DIAGNOSIS — F17210 Nicotine dependence, cigarettes, uncomplicated: Secondary | ICD-10-CM | POA: Diagnosis not present

## 2017-06-16 LAB — CBC
HEMATOCRIT: 43.6 % (ref 40.0–52.0)
Hemoglobin: 14.9 g/dL (ref 13.0–18.0)
MCH: 31.3 pg (ref 26.0–34.0)
MCHC: 34.1 g/dL (ref 32.0–36.0)
MCV: 91.7 fL (ref 80.0–100.0)
Platelets: 202 10*3/uL (ref 150–440)
RBC: 4.76 MIL/uL (ref 4.40–5.90)
RDW: 13.1 % (ref 11.5–14.5)
WBC: 5.3 10*3/uL (ref 3.8–10.6)

## 2017-06-17 ENCOUNTER — Ambulatory Visit
Admission: RE | Admit: 2017-06-17 | Discharge: 2017-06-17 | Disposition: A | Discharge status: Home / Self Care | Payer: Medicare Other | Source: Ambulatory Visit | Attending: Radiation Oncology | Admitting: Radiation Oncology

## 2017-06-17 DIAGNOSIS — Z510 Encounter for antineoplastic radiation therapy: Secondary | ICD-10-CM | POA: Diagnosis not present

## 2017-06-17 DIAGNOSIS — R490 Dysphonia: Secondary | ICD-10-CM | POA: Diagnosis not present

## 2017-06-17 DIAGNOSIS — M129 Arthropathy, unspecified: Secondary | ICD-10-CM | POA: Diagnosis not present

## 2017-06-17 DIAGNOSIS — K449 Diaphragmatic hernia without obstruction or gangrene: Secondary | ICD-10-CM | POA: Diagnosis not present

## 2017-06-17 DIAGNOSIS — F17210 Nicotine dependence, cigarettes, uncomplicated: Secondary | ICD-10-CM | POA: Diagnosis not present

## 2017-06-17 DIAGNOSIS — C320 Malignant neoplasm of glottis: Secondary | ICD-10-CM | POA: Diagnosis not present

## 2017-06-18 ENCOUNTER — Ambulatory Visit
Admission: RE | Admit: 2017-06-18 | Discharge: 2017-06-18 | Disposition: A | Discharge status: Home / Self Care | Payer: Medicare Other | Source: Ambulatory Visit | Attending: Radiation Oncology | Admitting: Radiation Oncology

## 2017-06-18 DIAGNOSIS — M129 Arthropathy, unspecified: Secondary | ICD-10-CM | POA: Diagnosis not present

## 2017-06-18 DIAGNOSIS — Z510 Encounter for antineoplastic radiation therapy: Secondary | ICD-10-CM | POA: Diagnosis not present

## 2017-06-18 DIAGNOSIS — F17210 Nicotine dependence, cigarettes, uncomplicated: Secondary | ICD-10-CM | POA: Diagnosis not present

## 2017-06-18 DIAGNOSIS — C320 Malignant neoplasm of glottis: Secondary | ICD-10-CM | POA: Diagnosis not present

## 2017-06-18 DIAGNOSIS — R490 Dysphonia: Secondary | ICD-10-CM | POA: Diagnosis not present

## 2017-06-18 DIAGNOSIS — K449 Diaphragmatic hernia without obstruction or gangrene: Secondary | ICD-10-CM | POA: Diagnosis not present

## 2017-06-21 ENCOUNTER — Ambulatory Visit
Admission: RE | Admit: 2017-06-21 | Discharge: 2017-06-21 | Disposition: A | Discharge status: Home / Self Care | Payer: Medicare Other | Source: Ambulatory Visit | Attending: Radiation Oncology | Admitting: Radiation Oncology

## 2017-06-21 ENCOUNTER — Other Ambulatory Visit: Payer: Self-pay | Admitting: *Deleted

## 2017-06-21 DIAGNOSIS — M129 Arthropathy, unspecified: Secondary | ICD-10-CM | POA: Diagnosis not present

## 2017-06-21 DIAGNOSIS — F17210 Nicotine dependence, cigarettes, uncomplicated: Secondary | ICD-10-CM | POA: Diagnosis not present

## 2017-06-21 DIAGNOSIS — R490 Dysphonia: Secondary | ICD-10-CM | POA: Diagnosis not present

## 2017-06-21 DIAGNOSIS — Z510 Encounter for antineoplastic radiation therapy: Secondary | ICD-10-CM | POA: Diagnosis not present

## 2017-06-21 DIAGNOSIS — C320 Malignant neoplasm of glottis: Secondary | ICD-10-CM | POA: Diagnosis not present

## 2017-06-21 DIAGNOSIS — K449 Diaphragmatic hernia without obstruction or gangrene: Secondary | ICD-10-CM | POA: Diagnosis not present

## 2017-06-21 MED ORDER — SILVER SULFADIAZINE 1 % EX CREA: 1.0000 "application " | TOPICAL_CREAM | Freq: Two times a day (BID) | CUTANEOUS | 2 refills | Status: DC

## 2017-06-23 ENCOUNTER — Inpatient Hospital Stay: Payer: Medicare Other

## 2017-06-23 ENCOUNTER — Ambulatory Visit
Admission: RE | Admit: 2017-06-23 | Discharge: 2017-06-23 | Disposition: A | Discharge status: Home / Self Care | Payer: Medicare Other | Source: Ambulatory Visit | Attending: Radiation Oncology | Admitting: Radiation Oncology

## 2017-06-23 DIAGNOSIS — C320 Malignant neoplasm of glottis: Secondary | ICD-10-CM

## 2017-06-23 DIAGNOSIS — Z923 Personal history of irradiation: Secondary | ICD-10-CM | POA: Diagnosis not present

## 2017-06-23 DIAGNOSIS — R490 Dysphonia: Secondary | ICD-10-CM | POA: Diagnosis not present

## 2017-06-23 DIAGNOSIS — Z79899 Other long term (current) drug therapy: Secondary | ICD-10-CM | POA: Diagnosis not present

## 2017-06-23 DIAGNOSIS — K449 Diaphragmatic hernia without obstruction or gangrene: Secondary | ICD-10-CM | POA: Diagnosis not present

## 2017-06-23 DIAGNOSIS — M129 Arthropathy, unspecified: Secondary | ICD-10-CM | POA: Diagnosis not present

## 2017-06-23 DIAGNOSIS — Z510 Encounter for antineoplastic radiation therapy: Secondary | ICD-10-CM | POA: Diagnosis not present

## 2017-06-23 DIAGNOSIS — F17210 Nicotine dependence, cigarettes, uncomplicated: Secondary | ICD-10-CM | POA: Diagnosis not present

## 2017-06-23 LAB — CBC
HCT: 44.2 % (ref 40.0–52.0)
Hemoglobin: 14.9 g/dL (ref 13.0–18.0)
MCH: 31 pg (ref 26.0–34.0)
MCHC: 33.7 g/dL (ref 32.0–36.0)
MCV: 91.9 fL (ref 80.0–100.0)
PLATELETS: 202 10*3/uL (ref 150–440)
RBC: 4.81 MIL/uL (ref 4.40–5.90)
RDW: 13.3 % (ref 11.5–14.5)
WBC: 6.4 10*3/uL (ref 3.8–10.6)

## 2017-06-24 ENCOUNTER — Ambulatory Visit
Admission: RE | Admit: 2017-06-24 | Discharge: 2017-06-24 | Disposition: A | Discharge status: Home / Self Care | Payer: Medicare Other | Source: Ambulatory Visit | Attending: Radiation Oncology | Admitting: Radiation Oncology

## 2017-06-24 DIAGNOSIS — R490 Dysphonia: Secondary | ICD-10-CM | POA: Diagnosis not present

## 2017-06-24 DIAGNOSIS — Z510 Encounter for antineoplastic radiation therapy: Secondary | ICD-10-CM | POA: Diagnosis not present

## 2017-06-24 DIAGNOSIS — C320 Malignant neoplasm of glottis: Secondary | ICD-10-CM | POA: Diagnosis not present

## 2017-06-24 DIAGNOSIS — K449 Diaphragmatic hernia without obstruction or gangrene: Secondary | ICD-10-CM | POA: Diagnosis not present

## 2017-06-24 DIAGNOSIS — M129 Arthropathy, unspecified: Secondary | ICD-10-CM | POA: Diagnosis not present

## 2017-06-24 DIAGNOSIS — F17210 Nicotine dependence, cigarettes, uncomplicated: Secondary | ICD-10-CM | POA: Diagnosis not present

## 2017-06-25 ENCOUNTER — Ambulatory Visit
Admission: RE | Admit: 2017-06-25 | Discharge: 2017-06-25 | Disposition: A | Discharge status: Home / Self Care | Payer: Medicare Other | Source: Ambulatory Visit | Attending: Radiation Oncology | Admitting: Radiation Oncology

## 2017-06-25 DIAGNOSIS — F17210 Nicotine dependence, cigarettes, uncomplicated: Secondary | ICD-10-CM | POA: Diagnosis not present

## 2017-06-25 DIAGNOSIS — R490 Dysphonia: Secondary | ICD-10-CM | POA: Diagnosis not present

## 2017-06-25 DIAGNOSIS — M129 Arthropathy, unspecified: Secondary | ICD-10-CM | POA: Diagnosis not present

## 2017-06-25 DIAGNOSIS — Z510 Encounter for antineoplastic radiation therapy: Secondary | ICD-10-CM | POA: Diagnosis not present

## 2017-06-25 DIAGNOSIS — K449 Diaphragmatic hernia without obstruction or gangrene: Secondary | ICD-10-CM | POA: Diagnosis not present

## 2017-06-25 DIAGNOSIS — C320 Malignant neoplasm of glottis: Secondary | ICD-10-CM | POA: Diagnosis not present

## 2017-06-28 ENCOUNTER — Ambulatory Visit
Admission: RE | Admit: 2017-06-28 | Discharge: 2017-06-28 | Disposition: A | Discharge status: Home / Self Care | Payer: Medicare Other | Source: Ambulatory Visit | Attending: Radiation Oncology | Admitting: Radiation Oncology

## 2017-06-28 DIAGNOSIS — R490 Dysphonia: Secondary | ICD-10-CM | POA: Diagnosis not present

## 2017-06-28 DIAGNOSIS — Z510 Encounter for antineoplastic radiation therapy: Secondary | ICD-10-CM | POA: Diagnosis not present

## 2017-06-28 DIAGNOSIS — C320 Malignant neoplasm of glottis: Secondary | ICD-10-CM | POA: Diagnosis not present

## 2017-06-28 DIAGNOSIS — F17210 Nicotine dependence, cigarettes, uncomplicated: Secondary | ICD-10-CM | POA: Diagnosis not present

## 2017-06-28 DIAGNOSIS — K449 Diaphragmatic hernia without obstruction or gangrene: Secondary | ICD-10-CM | POA: Diagnosis not present

## 2017-06-28 DIAGNOSIS — M129 Arthropathy, unspecified: Secondary | ICD-10-CM | POA: Diagnosis not present

## 2017-06-30 ENCOUNTER — Ambulatory Visit
Admission: RE | Admit: 2017-06-30 | Discharge: 2017-06-30 | Disposition: A | Discharge status: Home / Self Care | Payer: Medicare Other | Source: Ambulatory Visit | Attending: Radiation Oncology | Admitting: Radiation Oncology

## 2017-06-30 DIAGNOSIS — Z510 Encounter for antineoplastic radiation therapy: Secondary | ICD-10-CM | POA: Diagnosis not present

## 2017-06-30 DIAGNOSIS — M129 Arthropathy, unspecified: Secondary | ICD-10-CM | POA: Diagnosis not present

## 2017-06-30 DIAGNOSIS — Z87442 Personal history of urinary calculi: Secondary | ICD-10-CM | POA: Diagnosis not present

## 2017-06-30 DIAGNOSIS — F17210 Nicotine dependence, cigarettes, uncomplicated: Secondary | ICD-10-CM | POA: Diagnosis not present

## 2017-06-30 DIAGNOSIS — Z801 Family history of malignant neoplasm of trachea, bronchus and lung: Secondary | ICD-10-CM | POA: Diagnosis not present

## 2017-06-30 DIAGNOSIS — Z79899 Other long term (current) drug therapy: Secondary | ICD-10-CM | POA: Diagnosis not present

## 2017-06-30 DIAGNOSIS — Z7982 Long term (current) use of aspirin: Secondary | ICD-10-CM | POA: Diagnosis not present

## 2017-06-30 DIAGNOSIS — K449 Diaphragmatic hernia without obstruction or gangrene: Secondary | ICD-10-CM | POA: Diagnosis not present

## 2017-06-30 DIAGNOSIS — C320 Malignant neoplasm of glottis: Secondary | ICD-10-CM | POA: Diagnosis not present

## 2017-06-30 DIAGNOSIS — Z923 Personal history of irradiation: Secondary | ICD-10-CM | POA: Diagnosis not present

## 2017-06-30 DIAGNOSIS — R490 Dysphonia: Secondary | ICD-10-CM | POA: Diagnosis not present

## 2017-07-01 ENCOUNTER — Ambulatory Visit: Payer: Medicare Other

## 2017-07-01 ENCOUNTER — Ambulatory Visit
Admission: RE | Admit: 2017-07-01 | Discharge: 2017-07-01 | Disposition: A | Discharge status: Home / Self Care | Payer: Medicare Other | Source: Ambulatory Visit | Attending: Radiation Oncology | Admitting: Radiation Oncology

## 2017-07-01 DIAGNOSIS — F17210 Nicotine dependence, cigarettes, uncomplicated: Secondary | ICD-10-CM | POA: Diagnosis not present

## 2017-07-01 DIAGNOSIS — K449 Diaphragmatic hernia without obstruction or gangrene: Secondary | ICD-10-CM | POA: Diagnosis not present

## 2017-07-01 DIAGNOSIS — Z510 Encounter for antineoplastic radiation therapy: Secondary | ICD-10-CM | POA: Diagnosis not present

## 2017-07-01 DIAGNOSIS — M129 Arthropathy, unspecified: Secondary | ICD-10-CM | POA: Diagnosis not present

## 2017-07-01 DIAGNOSIS — C320 Malignant neoplasm of glottis: Secondary | ICD-10-CM | POA: Diagnosis not present

## 2017-07-01 DIAGNOSIS — R490 Dysphonia: Secondary | ICD-10-CM | POA: Diagnosis not present

## 2017-07-02 ENCOUNTER — Ambulatory Visit
Admission: RE | Admit: 2017-07-02 | Discharge: 2017-07-02 | Disposition: A | Discharge status: Home / Self Care | Payer: Medicare Other | Source: Ambulatory Visit | Attending: Radiation Oncology | Admitting: Radiation Oncology

## 2017-07-02 DIAGNOSIS — C320 Malignant neoplasm of glottis: Secondary | ICD-10-CM | POA: Diagnosis not present

## 2017-07-02 DIAGNOSIS — F17210 Nicotine dependence, cigarettes, uncomplicated: Secondary | ICD-10-CM | POA: Diagnosis not present

## 2017-07-02 DIAGNOSIS — R490 Dysphonia: Secondary | ICD-10-CM | POA: Diagnosis not present

## 2017-07-02 DIAGNOSIS — K449 Diaphragmatic hernia without obstruction or gangrene: Secondary | ICD-10-CM | POA: Diagnosis not present

## 2017-07-02 DIAGNOSIS — Z510 Encounter for antineoplastic radiation therapy: Secondary | ICD-10-CM | POA: Diagnosis not present

## 2017-07-02 DIAGNOSIS — M129 Arthropathy, unspecified: Secondary | ICD-10-CM | POA: Diagnosis not present

## 2017-07-05 DIAGNOSIS — K219 Gastro-esophageal reflux disease without esophagitis: Secondary | ICD-10-CM | POA: Diagnosis not present

## 2017-07-05 DIAGNOSIS — C329 Malignant neoplasm of larynx, unspecified: Secondary | ICD-10-CM | POA: Diagnosis not present

## 2017-07-08 DIAGNOSIS — H2513 Age-related nuclear cataract, bilateral: Secondary | ICD-10-CM | POA: Diagnosis not present

## 2017-07-13 DIAGNOSIS — H353221 Exudative age-related macular degeneration, left eye, with active choroidal neovascularization: Secondary | ICD-10-CM | POA: Diagnosis not present

## 2017-08-04 ENCOUNTER — Other Ambulatory Visit: Payer: Self-pay

## 2017-08-04 ENCOUNTER — Encounter: Payer: Self-pay | Admitting: Radiation Oncology

## 2017-08-04 ENCOUNTER — Ambulatory Visit
Admission: RE | Admit: 2017-08-04 | Discharge: 2017-08-04 | Disposition: A | Discharge status: Home / Self Care | Payer: Medicare Other | Source: Ambulatory Visit | Attending: Radiation Oncology | Admitting: Radiation Oncology

## 2017-08-04 VITALS — BP 144/60 | Temp 97.0°F | Resp 18 | Wt 209.9 lb

## 2017-08-04 DIAGNOSIS — C329 Malignant neoplasm of larynx, unspecified: Secondary | ICD-10-CM | POA: Insufficient documentation

## 2017-08-04 DIAGNOSIS — Z923 Personal history of irradiation: Secondary | ICD-10-CM | POA: Diagnosis not present

## 2017-08-04 DIAGNOSIS — C320 Malignant neoplasm of glottis: Secondary | ICD-10-CM

## 2017-08-04 NOTE — Progress Notes (Signed)
Radiation Oncology Follow up Note  Name: Jeffrey Torres   Date:   08/04/2017 MRN:  [id number] DOB: Oct 19, 1945    This 72 y.o. male presents to the clinic today for one-month follow-up status post external beam radiation therapy for squamous cell carcinoma of the larynx.  REFERRING PROVIDER: Birdie Sons, MD  HPI: Patient is a 72 year old male now out 1 month having completed external beam radiation therapy for squamous cell carcinoma the larynx. He is seen today in routine follow-up and is doing well. He specifically denies dysphagia cough or head and neck pain. His voice quality is improving..  COMPLICATIONS OF TREATMENT: none  FOLLOW UP COMPLIANCE: keeps appointments   PHYSICAL EXAM:  BP (!) 144/60   Temp (!) 97 F (36.1 C)   Resp 18   Wt 209 lb 14.1 oz (95.2 kg)   BMI 29.27 kg/m  Oral cavity is clear no oral mucosal lesions are identified. Indirect mirror examination shows cord approximately well still some edema present in the larynx. Neck is clear without evidence of subject gastric cervical or supraclavicular adenopathy. Well-developed well-nourished patient in NAD. HEENT reveals PERLA, EOMI, discs not visualized.  Oral cavity is clear. No oral mucosal lesions are identified. Neck is clear without evidence of cervical or supraclavicular adenopathy. Lungs are clear to A&P. Cardiac examination is essentially unremarkable with regular rate and rhythm without murmur rub or thrill. Abdomen is benign with no organomegaly or masses noted. Motor sensory and DTR levels are equal and symmetric in the upper and lower extremities. Cranial nerves II through XII are grossly intact. Proprioception is intact. No peripheral adenopathy or edema is identified. No motor or sensory levels are noted. Crude visual fields are within normal range.  RADIOLOGY RESULTS: No current films for review  PLAN: Present time he is recovering well from his radiation therapy treatments. I'm please was overall  progress. I've asked to see him back in 3-4 months for follow-up. I've asked him to make a follow-up appointment with ENT for close monitoring. Patient knows to call with any concerns.  I would like to take this opportunity to thank you for allowing me to participate in the care of your patient.Noreene Filbert, MD

## 2017-09-02 DIAGNOSIS — J301 Allergic rhinitis due to pollen: Secondary | ICD-10-CM | POA: Diagnosis not present

## 2017-09-02 DIAGNOSIS — C329 Malignant neoplasm of larynx, unspecified: Secondary | ICD-10-CM | POA: Diagnosis not present

## 2017-10-11 DIAGNOSIS — H353221 Exudative age-related macular degeneration, left eye, with active choroidal neovascularization: Secondary | ICD-10-CM | POA: Diagnosis not present

## 2017-10-22 ENCOUNTER — Encounter: Payer: Self-pay | Admitting: *Deleted

## 2017-11-01 ENCOUNTER — Encounter: Payer: Self-pay | Admitting: *Deleted

## 2017-11-05 DIAGNOSIS — J301 Allergic rhinitis due to pollen: Secondary | ICD-10-CM | POA: Diagnosis not present

## 2017-11-05 DIAGNOSIS — C329 Malignant neoplasm of larynx, unspecified: Secondary | ICD-10-CM | POA: Diagnosis not present

## 2017-12-10 ENCOUNTER — Encounter: Payer: Self-pay | Admitting: *Deleted

## 2017-12-13 ENCOUNTER — Ambulatory Visit: Payer: Medicare Other | Admitting: Radiation Oncology

## 2017-12-14 IMAGING — CT CT NECK W/ CM
5 of 6 series · 14 of 33 positions shown, 16 images · IV contrast (iopamidol)
Comparison: None.

CLINICAL DATA: Right anterior vocal cord tumor with recent biopsy.

EXAM:
CT NECK WITH CONTRAST
TECHNIQUE: Multidetector CT imaging of the neck was performed using the
standard protocol following the bolus administration of intravenous
contrast.
CONTRAST:  75mL PYRGFJ-ETT IOPAMIDOL (PYRGFJ-ETT) INJECTION 61%

[Series 2: axial neck · axial · 0.54mm/px · z∈[+304,+388]mm · 2 of 127 slices shown, 3 images]
[im 43/127  soft-tissue]
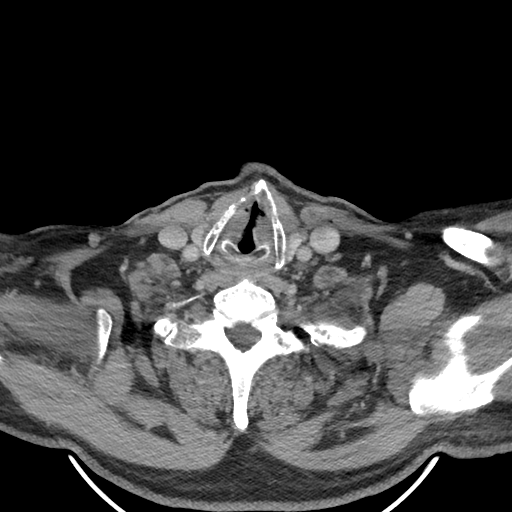
[im 43/127  bone]
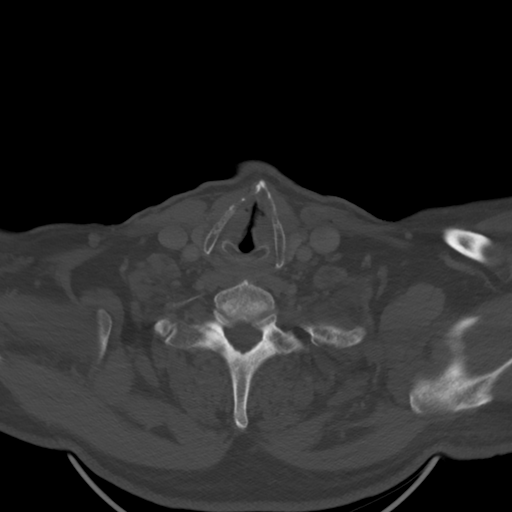
[im 85/127  bone]
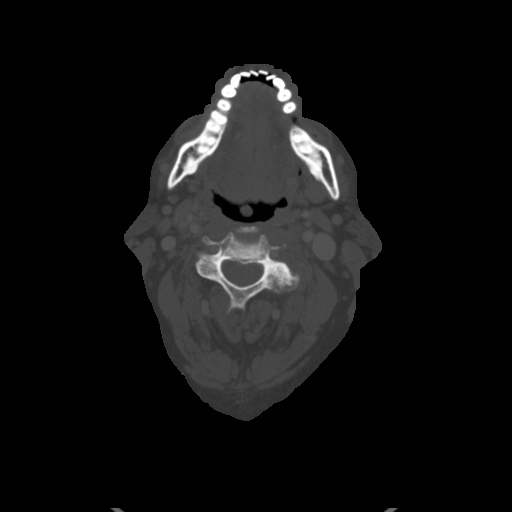

[Series 6: sag neck · sagittal · 0.47mm/px · 5 of 124 slices shown, 6 images]
[im 42/124  bone]
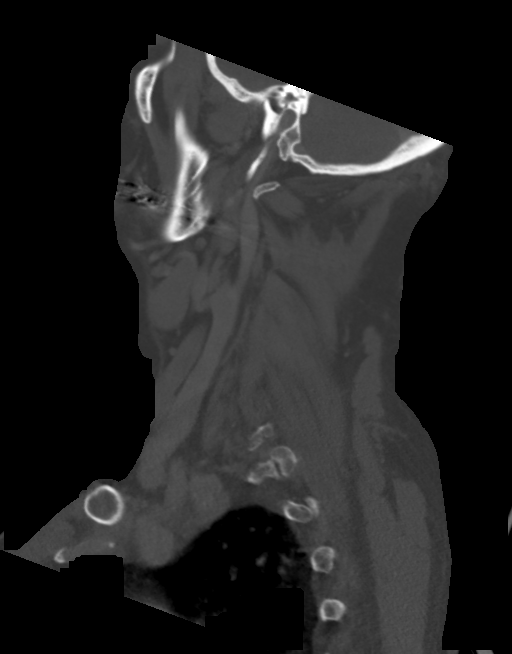
[im 52/124  bone]
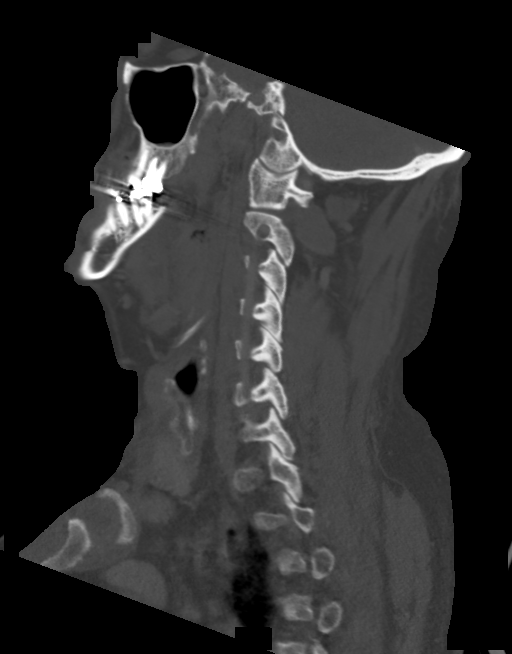
[im 62/124  soft-tissue]
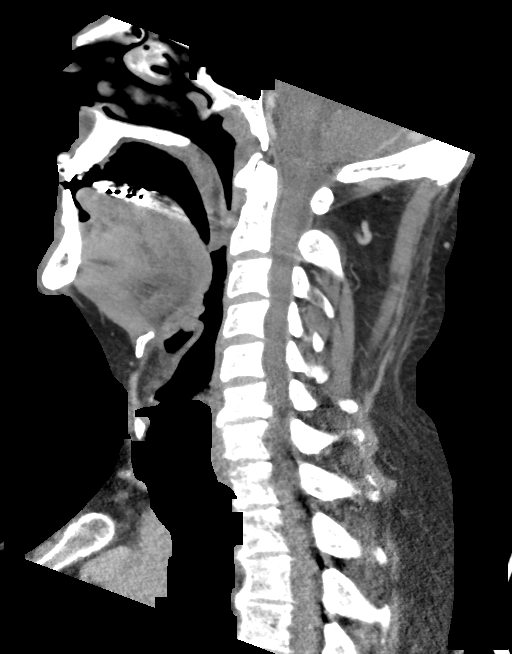
[im 62/124  bone]
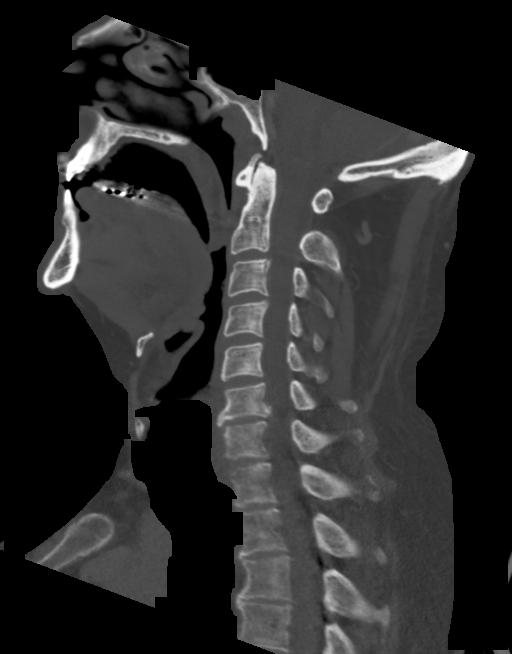
[im 72/124  bone]
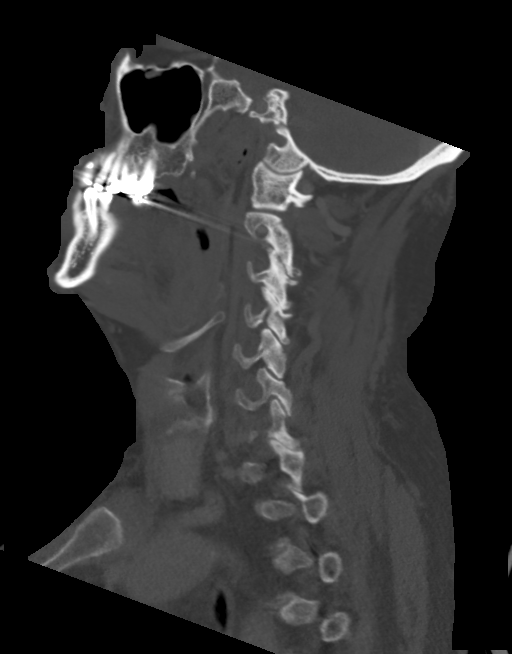
[im 83/124  bone]
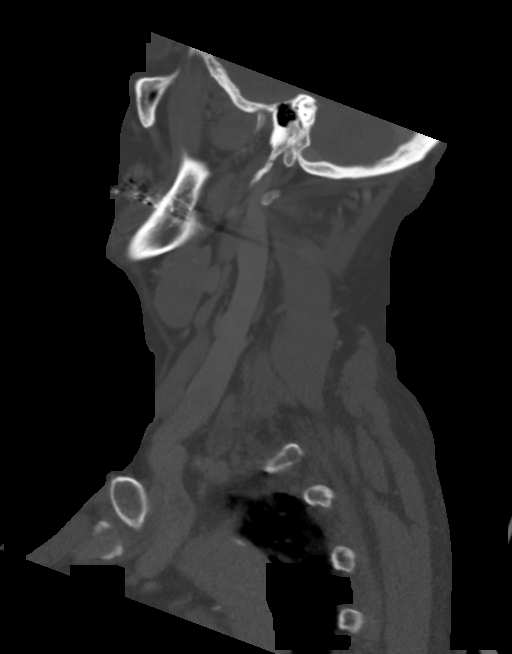

[Series 7: cor neck · coronal · 0.48mm/px · 3 of 87 slices shown]
[im 23/87  bone]
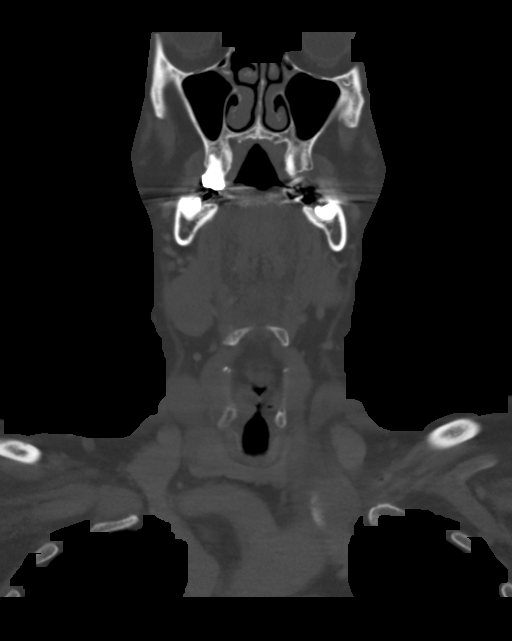
[im 37/87  bone]
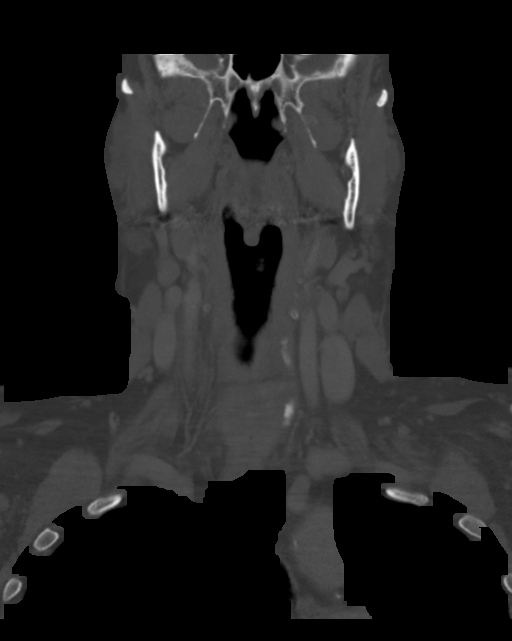
[im 51/87  bone]
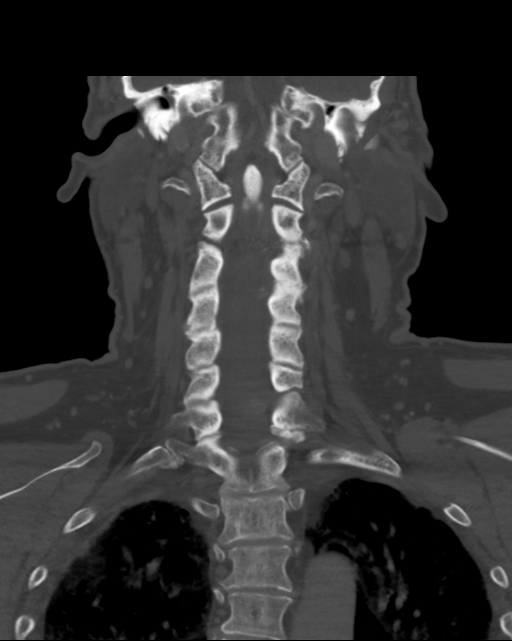

[Series 8: orthogonal ax · axial · 0.47mm/px · z∈[+265,+343]mm · 2 of 126 slices shown]
[im 42/126  bone]
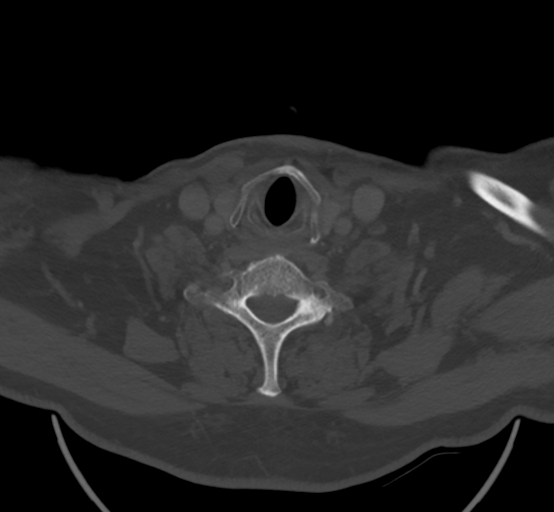
[im 84/126  bone]
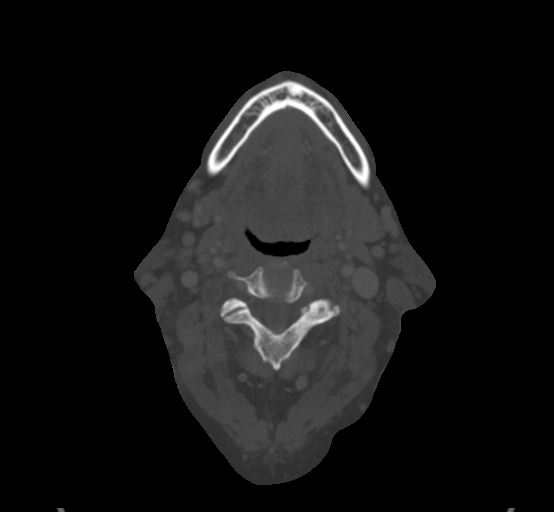

[Series 9: axial neck (person_name) · axial · 0.54mm/px · z∈[+304,+388]mm · 2 of 127 slices shown]
[im 43/127  bone]
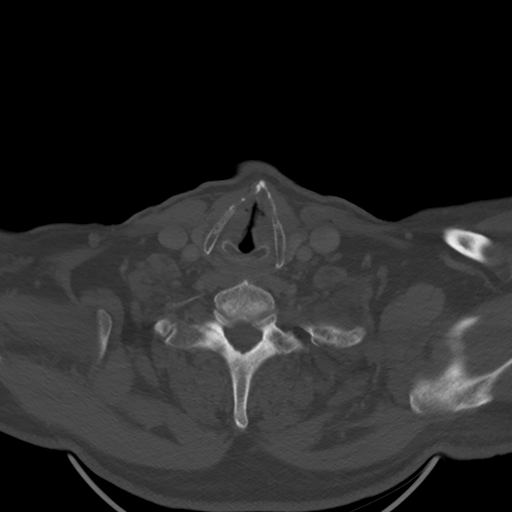
[im 85/127  bone]
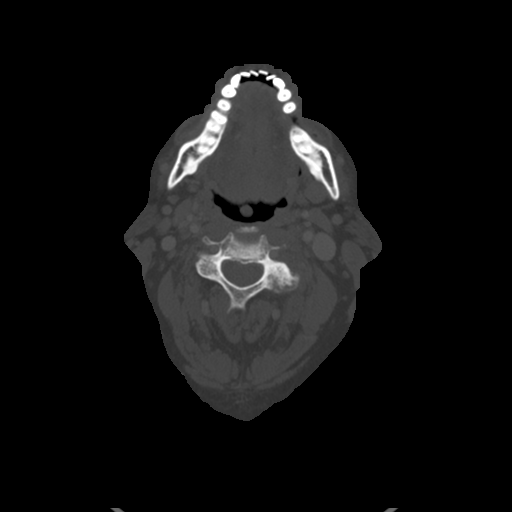

[14 of 33 positions shown; findings below may reference images not displayed]

FINDINGS: Pharynx and larynx: Small area of soft tissue thickening of the
right anterior vocal cord consistent with biopsy-proven carcinoma.
Cartilage intact. Airway intact. No evidence of vocal cord paresis.

Prominent pharyngeal and lingual tonsils bilaterally right greater
than left. Recommend direct visualization and biopsy if appropriate.
Epiglottis normal.

Salivary glands: Negative

Thyroid: Negative

Lymph nodes: No pathologic or enlarged lymph nodes in the neck.

Vascular: Mild atherosclerotic disease the carotid bifurcation
bilaterally.

Limited intracranial: Negative

Visualized orbits: Negative

Mastoids and visualized paranasal sinuses: Negative

Skeleton: Mild cervical spine degenerative change. No acute skeletal
abnormality.

Upper chest: Negative

Other: None
IMPRESSION: Small area of soft tissue thickening enhancement right anterior
vocal cord compatible with known neoplasm. No adenopathy in the
neck.

Prominent pharyngeal and lingual tonsils bilaterally right greater
than left. Correlate with endoscopy and biopsy if appropriate.

## 2017-12-20 ENCOUNTER — Ambulatory Visit: Payer: Medicare Other | Admitting: Radiation Oncology

## 2017-12-28 ENCOUNTER — Ambulatory Visit (INDEPENDENT_AMBULATORY_CARE_PROVIDER_SITE_OTHER): Payer: Medicare Other | Admitting: Family Medicine

## 2017-12-28 ENCOUNTER — Ambulatory Visit (INDEPENDENT_AMBULATORY_CARE_PROVIDER_SITE_OTHER): Payer: Medicare Other

## 2017-12-28 ENCOUNTER — Encounter: Payer: Self-pay | Admitting: Family Medicine

## 2017-12-28 VITALS — BP 136/62 | HR 62 | Temp 97.4°F | Ht 71.0 in | Wt 201.8 lb

## 2017-12-28 DIAGNOSIS — Z8521 Personal history of malignant neoplasm of larynx: Secondary | ICD-10-CM | POA: Diagnosis not present

## 2017-12-28 DIAGNOSIS — Z0000 Encounter for general adult medical examination without abnormal findings: Secondary | ICD-10-CM

## 2017-12-28 DIAGNOSIS — Z131 Encounter for screening for diabetes mellitus: Secondary | ICD-10-CM | POA: Diagnosis not present

## 2017-12-28 DIAGNOSIS — F17210 Nicotine dependence, cigarettes, uncomplicated: Secondary | ICD-10-CM | POA: Diagnosis not present

## 2017-12-28 DIAGNOSIS — Z125 Encounter for screening for malignant neoplasm of prostate: Secondary | ICD-10-CM

## 2017-12-28 MED ORDER — BUPROPION HCL ER (SR) 150 MG PO TB12: ORAL_TABLET | ORAL | 3 refills | Status: DC

## 2017-12-28 NOTE — Progress Notes (Signed)
Subjective:   Jeffrey Torres is a 72 y.o. male who presents for Medicare Annual/Subsequent preventive examination.  Review of Systems:  N/A  Cardiac Risk Factors include: advanced age (>47men, >52 women);male gender;smoking/ tobacco exposure     Objective:    Vitals: BP 136/62 (BP Location: Right Arm)   Pulse 62   Temp (!) 97.4 F (36.3 C) (Oral)   Ht 5\' 11"  (1.803 m)   Wt 201 lb 12.8 oz (91.5 kg)   BMI 28.15 kg/m   Body mass index is 28.15 kg/m.  Advanced Directives 12/28/2017 08/04/2017 04/15/2017 04/01/2017 03/26/2017 12/15/2016  Does Patient Have a Medical Advance Directive? Yes No No No No No  Type of Paramedic of [place];Living will - - - - -  Copy of [place] in Chart? No - copy requested - - - - -  Would patient like information on creating a medical advance directive? - No - Patient declined No - Patient declined No - Patient declined No - Patient declined No - Patient declined    Tobacco Social History   Tobacco Use  Smoking Status Current Every Day Smoker  . Packs/day: 1.00  . Types: Cigarettes  Smokeless Tobacco Never Used  Tobacco Comment   started age 74 yo.previously smoked carton a week     Ready to quit: Yes Counseling given: Not Answered Comment: started age 11 yo.previously smoked carton a week   Clinical Intake:  Pre-visit preparation completed: Yes  Pain : No/denies pain Pain Score: 0-No pain     Nutritional Status: BMI 25 -29 Overweight Nutritional Risks: None Diabetes: No  How often do you need to have someone help you when you read instructions, pamphlets, or other written materials from your doctor or pharmacy?: 1 - Never  Interpreter Needed?: No  Information entered by :: [facility], LPN  Past Medical History:  Diagnosis Date  . Arthritis   . Cancer ([place])    larynx ca  . Dysrhythmia   . GERD (gastroesophageal reflux disease)   . Glucosuria   . Hernia, inguinal, bilateral   .  History of chicken pox   . History of kidney stones   . History of measles   . History of mumps   . Macular degeneration of both eyes   . PVC (premature ventricular contraction)    Past Surgical History:  Procedure Laterality Date  . CARDIAC CATHETERIZATION  2010   normal per patient report  . COLONOSCOPY  06/24/2006   Dr. Bary Castilla. Multiple benign appearing 46mm polyps in the cecum and in the rectum. -Three 85mm polyps in the transversed colon, in the tranverse colon, proximal and in the distal transverse colon. Resected and retrieved.  . Kidney stones removed  2001   [place]  . LARYNGOSCOPY Right 04/01/2017   Procedure: SUSPENSION LARYNGOSCOPY WITH MICROFLAP EXCISION;  Surgeon: Carloyn Manner, MD;  Location: [facility] ORS;  Service: ENT;  Laterality: Right;  . sinus surgery   2002   [place]; removal of polyps  . TONSILLECTOMY  1954  . Tubular adenoma removed  06/24/2006   Family History  Problem Relation Age of Onset  . Lung cancer Mother   . Heart disease Father    Social History   Socioeconomic History  . Marital status: Married    Spouse name: Not on file  . Number of children: 2  . Years of education: Not on file  . Highest education level: 12th grade  Occupational History  . Occupation: Truck  driver  Social Needs  . Financial resource strain: Not hard at all  . Food insecurity:    Worry: Never true    Inability: Never true  . Transportation needs:    Medical: No    Non-medical: No  Tobacco Use  . Smoking status: Current Every Day Smoker    Packs/day: 1.00    Types: Cigarettes  . Smokeless tobacco: Never Used  . Tobacco comment: started age 89 yo.previously smoked carton a week  Substance and Sexual Activity  . Alcohol use: Yes    Alcohol/week: 1.8 - 3.0 oz    Types: 3 - 5 Cans of beer per week  . Drug use: No  . Sexual activity: Not on file  Lifestyle  . Physical activity:    Days per week: Not on file    Minutes per session: Not on file  . Stress: To  some extent  Relationships  . Social connections:    Talks on phone: Not on file    Gets together: Not on file    Attends religious service: Not on file    Active member of club or organization: Not on file    Attends meetings of clubs or organizations: Not on file    Relationship status: Not on file  Other Topics Concern  . 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 aspirin 81 MG tablet Take 81 mg by mouth daily.  . fluticasone (FLONASE) 50 MCG/ACT nasal spray Place 2 sprays into both nostrils every morning.  . Multiple Vitamins-Minerals (PRESERVISION AREDS 2 PO) Take 2 capsules by mouth every morning.  Marland Kitchen omeprazole (PRILOSEC) 40 MG capsule Take 40 mg by mouth every morning. 30 MIN BEFORE EATING  . HYDROcodone-acetaminophen (NORCO) 5-325 MG tablet Take 1 tablet by mouth every 4 (four) hours as needed for moderate pain. (Patient not taking: Reported on 12/28/2017)  . ondansetron (ZOFRAN) 4 MG tablet Take 1 tablet (4 mg total) by mouth every 8 (eight) hours as needed for nausea or vomiting. (Patient not taking: Reported on 12/28/2017)  . silver sulfADIAZINE (SILVADENE) 1 % cream Apply 1 application topically 2 (two) times daily. (Patient not taking: Reported on 12/28/2017)  . varenicline (CHANTIX CONTINUING MONTH PAK) 1 MG tablet Take 1 tablet (1 mg total) by mouth 2 (two) times daily. Follow package instructions; hold for a month (Patient not taking: Reported on 12/28/2017)  . varenicline (CHANTIX) 0.5 MG tablet Take 1 tablet (0.5 mg total) by mouth 2 (two) times daily. Follow package instructions (Patient not taking: Reported on 12/28/2017)   No facility-administered encounter medications on file as of 12/28/2017.     Activities of Daily Living In your present state of health, do you have any difficulty performing the following activities: 12/28/2017 03/26/2017  Hearing? Y N  Comment Due to  tinnitus and hearing loss in left ear. -  Vision? Y Y  Comment Due to macular degeneration. macular degeneration disease, and cataracts  Difficulty concentrating or making decisions? N N  Walking or climbing stairs? N N  Dressing or bathing? N N  Doing errands, shopping? N -  Preparing Food and eating ? N -  Using the Toilet? N -  In the past six months, have you accidently leaked urine? N -  Do you have problems with loss of bowel control? N -  Managing your Medications? N -  Managing your Finances? N -  Housekeeping or managing your Housekeeping? N -  Some recent data might be hidden    Patient Care Team: Birdie Sons, MD as PCP - General (Family Medicine) Birder Robson, MD as Referring Physician (Ophthalmology) Carloyn Manner, MD as Referring Physician (Otolaryngology) Noreene Filbert, MD as Referring Physician (Radiation Oncology)   Assessment:   This is a routine wellness examination for Jayd.  Exercise Activities and Dietary recommendations Current Exercise Habits: Home exercise routine, Type of exercise: walking, Time (Minutes): 45, Frequency (Times/Week): 3, Weekly Exercise (Minutes/Week): 135, Intensity: Mild, Exercise limited by: orthopedic condition(s)  Goals    . DIET - REDUCE CALORIE INTAKE     Recommend continuing current diet plan of avoiding fried foods and junk food to help aid in weight loss.        Fall Risk Fall Risk  12/28/2017 12/15/2016 12/09/2015  Falls in the past year? No No No   Is the patient's home free of loose throw rugs in walkways, pet beds, electrical cords, etc?   yes      Grab bars in the bathroom? no      Handrails on the stairs?   no      Adequate lighting?   yes  Timed Get Up and Go Performed: N/A  Depression Screen PHQ 2/9 Scores 12/28/2017 12/15/2016 12/15/2016 12/09/2015  PHQ - 2 Score 0 0 0 0  PHQ- 9 Score - 4 - -    Cognitive Function: Pt declined screening today.      6CIT Screen 12/15/2016  What Year? 0 points    What month? 0 points  What time? 0 points  Count back from 20 0 points  Months in reverse 0 points  Repeat phrase 0 points  Total Score 0    Immunization History  Administered Date(s) Administered  . Influenza, High Dose Seasonal PF 03/25/2017  . Pneumococcal Conjugate-13 04/26/2014  . Pneumococcal Polysaccharide-23 05/28/2012    Qualifies for Shingles Vaccine? Due for Shingles vaccine. Declined my offer to administer today. Education has been provided regarding the importance of this vaccine. Pt has been advised to call her insurance company to determine her out of pocket expense. Advised she may also receive this vaccine at her local pharmacy or Health Dept. Verbalized acceptance and understanding.  Screening Tests Health Maintenance  Topic Date Due  . TETANUS/TDAP  03/13/1966  . INFLUENZA VACCINE  01/27/2018  . COLONOSCOPY  08/05/2019  . Hepatitis C Screening  Completed  . PNA vac Low Risk Adult  Completed   Cancer Screenings: Lung: Low Dose CT Chest recommended if Age 22-80 years, 30 pack-year currently smoking OR have quit w/in 15years. Patient does qualify. An Epic message has been sent to Burgess Estelle, RN (Oncology Nurse Navigator) regarding the possible need for this exam. Raquel Sarna will review the patient's chart to determine if the patient truly qualifies for the exam. If the patient qualifies, Raquel Sarna will order the Low Dose CT of the chest to facilitate the scheduling of this exam. Colorectal: Up to date  Additional Screenings:  Hepatitis C Screening: Up to date      Plan:  I have personally reviewed and addressed the Medicare Annual Wellness questionnaire and have noted the following in the patient's chart:  A. Medical and social history B. Use of alcohol, tobacco or illicit drugs  C. Current medications and supplements D. Functional ability and status E.  Nutritional status F.  Physical activity G. Advance directives H. List of other physicians I.   Hospitalizations, surgeries, and  ER visits in previous 12 months J.  [place] such as hearing and vision if needed, cognitive and depression L. Referrals and appointments - none  In addition, I have reviewed and discussed with patient certain preventive protocols, quality metrics, and best practice recommendations. A written personalized care plan for preventive services as well as general preventive health recommendations were provided to patient.  See attached scanned questionnaire for additional information.   Signed,  Fabio Neighbors, LPN Nurse Health Advisor   Nurse Recommendations: Pt declined the tetanus vaccine today. Note sent to nurse navigator Burgess Estelle) to see about setting up a low dose chest CT scan due to pts smoking hx.

## 2017-12-28 NOTE — Patient Instructions (Addendum)
Jeffrey Torres , Thank you for taking time to come for your Medicare Wellness Visit. I appreciate your ongoing commitment to your health goals. Please review the following plan we discussed and let me know if I can assist you in the future.   Screening recommendations/referrals: Colonoscopy: Up to date Recommended yearly ophthalmology/optometry visit for glaucoma screening and checkup Recommended yearly dental visit for hygiene and checkup  Vaccinations: Influenza vaccine: Up to date Pneumococcal vaccine: Up to date Tdap vaccine: Pt declines today.  Shingles vaccine: Pt declines today.     Advanced directives: Please bring a copy of your POA (Power of Attorney) and/or Living Will to your next appointment.   Conditions/risks identified: Recommend continuing current diet plan of avoiding fried foods and junk food to help aid in weight loss.   Next appointment: 10:40 AM today with Dr Caryn Section.   Preventive Care 38 Years and Older, Male Preventive care refers to lifestyle choices and visits with your health care provider that can promote health and wellness. What does preventive care include?  A yearly physical exam. This is also called an annual well check.  Dental exams once or twice a year.  Routine eye exams. Ask your health care provider how often you should have your eyes checked.  Personal lifestyle choices, including:  Daily care of your teeth and gums.  Regular physical activity.  Eating a healthy diet.  Avoiding tobacco and drug use.  Limiting alcohol use.  Practicing safe sex.  Taking low doses of aspirin every day.  Taking vitamin and mineral supplements as recommended by your health care provider. What happens during an annual well check? The services and screenings done by your health care provider during your annual well check will depend on your age, overall health, lifestyle risk factors, and family history of disease. Counseling  Your health care provider may  ask you questions about your:  Alcohol use.  Tobacco use.  Drug use.  Emotional well-being.  Home and relationship well-being.  Sexual activity.  Eating habits.  History of falls.  Memory and ability to understand (cognition).  Work and work Statistician. Screening  You may have the following tests or measurements:  Height, weight, and BMI.  Blood pressure.  Lipid and cholesterol levels. These may be checked every 5 years, or more frequently if you are over 3 years old.  Skin check.  Lung cancer screening. You may have this screening every year starting at age 53 if you have a 30-pack-year history of smoking and currently smoke or have quit within the past 15 years.  Fecal occult blood test (FOBT) of the stool. You may have this test every year starting at age 21.  Flexible sigmoidoscopy or colonoscopy. You may have a sigmoidoscopy every 5 years or a colonoscopy every 10 years starting at age 72.  Prostate cancer screening. Recommendations will vary depending on your family history and other risks.  Hepatitis C blood test.  Hepatitis B blood test.  Sexually transmitted disease (STD) testing.  Diabetes screening. This is done by checking your blood sugar (glucose) after you have not eaten for a while (fasting). You may have this done every 1-3 years.  Abdominal aortic aneurysm (AAA) screening. You may need this if you are a current or former smoker.  Osteoporosis. You may be screened starting at age 55 if you are at high risk. Talk with your health care provider about your test results, treatment options, and if necessary, the need for more tests. Vaccines  Your  health care provider may recommend certain vaccines, such as:  Influenza vaccine. This is recommended every year.  Tetanus, diphtheria, and acellular pertussis (Tdap, Td) vaccine. You may need a Td booster every 10 years.  Zoster vaccine. You may need this after age 40.  Pneumococcal 13-valent  conjugate (PCV13) vaccine. One dose is recommended after age 41.  Pneumococcal polysaccharide (PPSV23) vaccine. One dose is recommended after age 13. Talk to your health care provider about which screenings and vaccines you need and how often you need them. This information is not intended to replace advice given to you by your health care provider. Make sure you discuss any questions you have with your health care provider. Document Released: 07/12/2015 Document Revised: 03/04/2016 Document Reviewed: 04/16/2015 Elsevier Interactive Patient Education  2017 [place] Prevention in the Home Falls can cause injuries. They can happen to people of all ages. There are many things you can do to make your home safe and to help prevent falls. What can I do on the outside of my home?  Regularly fix the edges of walkways and driveways and fix any cracks.  Remove anything that might make you trip as you walk through a door, such as a raised step or threshold.  Trim any bushes or trees on the path to your home.  Use bright outdoor lighting.  Clear any walking paths of anything that might make someone trip, such as rocks or tools.  Regularly check to see if handrails are loose or broken. Make sure that both sides of any steps have handrails.  Any raised decks and porches should have guardrails on the edges.  Have any leaves, snow, or ice cleared regularly.  Use sand or salt on walking paths during winter.  Clean up any spills in your garage right away. This includes oil or grease spills. What can I do in the bathroom?  Use night lights.  Install grab bars by the toilet and in the tub and shower. Do not use towel bars as grab bars.  Use non-skid mats or decals in the tub or shower.  If you need to sit down in the shower, use a plastic, non-slip stool.  Keep the floor dry. Clean up any water that spills on the floor as soon as it happens.  Remove soap buildup in the tub or  shower regularly.  Attach bath mats securely with double-sided non-slip rug tape.  Do not have throw rugs and other things on the floor that can make you trip. What can I do in the bedroom?  Use night lights.  Make sure that you have a light by your bed that is easy to reach.  Do not use any sheets or blankets that are too big for your bed. They should not hang down onto the floor.  Have a firm chair that has side arms. You can use this for support while you get dressed.  Do not have throw rugs and other things on the floor that can make you trip. What can I do in the kitchen?  Clean up any spills right away.  Avoid walking on wet floors.  Keep items that you use a lot in easy-to-reach places.  If you need to reach something above you, use a strong step stool that has a grab bar.  Keep electrical cords out of the way.  Do not use floor polish or wax that makes floors slippery. If you must use wax, use non-skid floor wax.  Do not  have throw rugs and other things on the floor that can make you trip. What can I do with my stairs?  Do not leave any items on the stairs.  Make sure that there are handrails on both sides of the stairs and use them. Fix handrails that are broken or loose. Make sure that handrails are as long as the stairways.  Check any carpeting to make sure that it is firmly attached to the stairs. Fix any carpet that is loose or worn.  Avoid having throw rugs at the top or bottom of the stairs. If you do have throw rugs, attach them to the floor with carpet tape.  Make sure that you have a light switch at the top of the stairs and the bottom of the stairs. If you do not have them, ask someone to add them for you. What else can I do to help prevent falls?  Wear shoes that:  Do not have high heels.  Have rubber bottoms.  Are comfortable and fit you well.  Are closed at the toe. Do not wear sandals.  If you use a stepladder:  Make sure that it is fully  opened. Do not climb a closed stepladder.  Make sure that both sides of the stepladder are locked into place.  Ask someone to hold it for you, if possible.  Clearly mark and make sure that you can see:  Any grab bars or handrails.  First and last steps.  Where the edge of each step is.  Use tools that help you move around (mobility aids) if they are needed. These include:  Canes.  Walkers.  Scooters.  Crutches.  Turn on the lights when you go into a dark area. Replace any light bulbs as soon as they burn out.  Set up your furniture so you have a clear path. Avoid moving your furniture around.  If any of your floors are uneven, fix them.  If there are any pets around you, be aware of where they are.  Review your medicines with your doctor. Some medicines can make you feel dizzy. This can increase your chance of falling. Ask your doctor what other things that you can do to help prevent falls. This information is not intended to replace advice given to you by your health care provider. Make sure you discuss any questions you have with your health care provider. Document Released: 04/11/2009 Document Revised: 11/21/2015 Document Reviewed: 07/20/2014 Elsevier Interactive Patient Education  2017 [employer].

## 2017-12-28 NOTE — Progress Notes (Signed)
Patient: Jeffrey Torres Male    DOB: 1946/04/29   72 y.o.   MRN: [id number] Visit Date: 12/28/2017  Today's Provider: Lelon Huh, MD   Chief Complaint  Patient presents with  . Follow-up   Subjective:   Patient saw McKenzie for AWV today at 10:00 am.  HPI  Tobacco Use From 03/25/2017-spirometry obtained. No changes made. Today patient reports he smoked 1ppd. He states his interest in smoking cessation is low. He tried chantix but states it made him depresses. He is interested in trying a different medication.    Allergies  Allergen Reactions  . Penicillins Swelling    Other reaction(s): SWELLING/EDEMA IN THROAT Other reaction(s): SWELLING/EDEMA     Current Outpatient Medications:  .  aspirin 81 MG tablet, Take 81 mg by mouth daily., Disp: , Rfl:  .  fluticasone (FLONASE) 50 MCG/ACT nasal spray, Place 2 sprays into both nostrils every morning., Disp: , Rfl:  .  Multiple Vitamins-Minerals (PRESERVISION AREDS 2 PO), Take 2 capsules by mouth every morning., Disp: , Rfl:  .  omeprazole (PRILOSEC) 40 MG capsule, Take 40 mg by mouth every morning. 65 MIN BEFORE EATING, Disp: , Rfl:   Review of Systems  Constitutional: Negative for appetite change, chills and fever.  HENT: Positive for congestion, hearing loss, sinus pressure and tinnitus.   Eyes: Positive for photophobia.  Respiratory: Negative for chest tightness, shortness of breath and wheezing.   Cardiovascular: Negative for chest pain and palpitations.  Gastrointestinal: Negative for abdominal pain, nausea and vomiting.  Genitourinary: Positive for frequency.  Musculoskeletal: Positive for arthralgias, back pain and neck pain.  Hematological: Bruises/bleeds easily.  Psychiatric/Behavioral: Positive for sleep disturbance. The patient is nervous/anxious.     Social History   Tobacco Use  . Smoking status: Current Every Day Smoker    Packs/day: 1.00    Types: Cigarettes  . Smokeless tobacco: Never Used  .  Tobacco comment: started age 48 yo.previously smoked carton a week  Substance Use Topics  . Alcohol use: Yes    Alcohol/week: 1.8 - 3.0 oz    Types: 3 - 5 Cans of beer per week   Objective:   There were no vitals taken for this visit. There were no vitals filed for this visit. Vitals:  BP 136/62 (BP Location: Right Arm)  Pulse 62  Temp 97.4 F (36.3 C)(Oral)  Ht 5\' 11"  (1.803 m)  Wt 201 lb 12.8 oz (91.5 kg)  BMI 28.15 kg/m  BSA 2.14 m    Physical Exam    General Appearance:    Alert, cooperative, no distress  Eyes:    PERRL, conjunctiva/corneas clear, EOM's intact       Lungs:     Clear to auscultation bilaterally, respirations unlabored  Heart:    Regular rate and rhythm  Neurologic:   Awake, alert, oriented x 3. No apparent focal neurological           defect.        Audit-C Alcohol Use Screening   Alcohol Use Disorder Test (AUDIT) 12/28/2017  1. How often do you have a drink containing alcohol? 3  2. How many drinks containing alcohol do you have on a typical day when you are drinking? 1  3. How often do you have six or more drinks on one occasion? 1  AUDIT-C Score 5  4. How often during the last year have you found that you were not able to stop drinking once you had started?  0  5. How often during the last year have you failed to do what was normally expected from you becasue of drinking? 0  6. How often during the last year have you needed a first drink in the morning to get yourself going after a heavy drinking session? 0  7. How often during the last year have you had a feeling of guilt of remorse after drinking? 0  8. How often during the last year have you been unable to remember what happened the night before because you had been drinking? 0  9. Have you or someone else been injured as a result of your drinking? 0  10. Has a relative or friend or a doctor or another health worker been concerned about your drinking or suggested you cut down? 0  Alcohol Use  Disorder Identification Test Final Score (AUDIT) 5  Intervention/Follow-up AUDIT Score <7 follow-up not indicated    A score of 3 or more in women, and 4 or more in men indicates increased risk for alcohol abuse, EXCEPT if all of the points are from question 1      Assessment & Plan:     1. Smoking greater than 30 pack years Did not tolerate Chantix.  - buPROPion (WELLBUTRIN SR) 150 MG 12 hr tablet; 1 tablet daily for 3 days, then 1 tablet twice daily. Stop smoking 14 days after starting medication  Dispense: 60 tablet; Refill: 3  2. Prostate cancer screening  - PSA  3. Diabetes mellitus screening  - Glucose  4. History of laryngeal cancer Continue regular follow up ENT and radiation oncology       Lelon Huh, MD  [facility]

## 2017-12-28 NOTE — Patient Instructions (Signed)
   The CDC recommends two doses of Shingrix (the shingles vaccine) separated by 2 to 6 months for adults age 71 years and older. I recommend checking with your insurance plan regarding coverage for this vaccine.   

## 2017-12-29 LAB — GLUCOSE, RANDOM: Glucose: 84 mg/dL (ref 65–99)

## 2017-12-29 LAB — PSA: Prostate Specific Ag, Serum: 1.7 ng/mL (ref 0.0–4.0)

## 2018-01-03 DIAGNOSIS — H353221 Exudative age-related macular degeneration, left eye, with active choroidal neovascularization: Secondary | ICD-10-CM | POA: Diagnosis not present

## 2018-01-18 ENCOUNTER — Ambulatory Visit: Payer: Medicare Other | Admitting: Radiation Oncology

## 2018-01-24 ENCOUNTER — Encounter: Payer: Self-pay | Admitting: Radiation Oncology

## 2018-01-24 ENCOUNTER — Ambulatory Visit
Admission: RE | Admit: 2018-01-24 | Discharge: 2018-01-24 | Disposition: A | Discharge status: Home / Self Care | Payer: Medicare Other | Source: Ambulatory Visit | Attending: Radiation Oncology | Admitting: Radiation Oncology

## 2018-01-24 ENCOUNTER — Other Ambulatory Visit: Payer: Self-pay

## 2018-01-24 VITALS — BP 148/79 | HR 54 | Temp 98.2°F | Resp 20 | Wt 199.8 lb

## 2018-01-24 DIAGNOSIS — Z8521 Personal history of malignant neoplasm of larynx: Secondary | ICD-10-CM | POA: Insufficient documentation

## 2018-01-24 DIAGNOSIS — F17210 Nicotine dependence, cigarettes, uncomplicated: Secondary | ICD-10-CM | POA: Diagnosis not present

## 2018-01-24 DIAGNOSIS — Z923 Personal history of irradiation: Secondary | ICD-10-CM | POA: Diagnosis not present

## 2018-01-24 DIAGNOSIS — C320 Malignant neoplasm of glottis: Secondary | ICD-10-CM

## 2018-01-24 NOTE — Progress Notes (Signed)
Radiation Oncology Follow up Note  Name: Jeffrey Torres   Date:   01/24/2018 MRN:  [id number] DOB: 12-24-1945    This 72 y.o. 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 Seen today in routine follow-up he is doing well he specifically denies dysphagia or head and neck pain. His voice quality has improved..  COMPLICATIONS OF TREATMENT: none  FOLLOW UP COMPLIANCE: keeps appointments   PHYSICAL EXAM:  BP (!) 148/79   Pulse (!) 54   Temp 98.2 F (36.8 C)   Resp 20   Wt 199 lb 13.6 oz (90.6 kg)   BMI 27.87 kg/m  Oral cavity is clear no oral mucosal lesions are identified indirect mirror examination shows cord approximately well no evidence of disease and larynx is noted vallecula and base of tongue within normal limits. The still has some slight edema of his cords. Neck is clear without evidence of subject gastric cervical or supraclavicular adenopathy.Well-developed well-nourished patient in NAD. HEENT reveals PERLA, EOMI, discs not visualized.  Oral cavity is clear. No oral mucosal lesions are identified. Neck is clear without evidence of cervical or supraclavicular adenopathy. Lungs are clear to A&P. Cardiac examination is essentially unremarkable with regular rate and rhythm without murmur rub or thrill. Abdomen is benign with no organomegaly or masses noted. Motor sensory and DTR levels are equal and symmetric in the upper and lower extremities. Cranial nerves II through XII are grossly intact. Proprioception is intact. No peripheral adenopathy or edema is identified. No motor or sensory levels are noted. Crude visual fields are within normal range.  RADIOLOGY RESULTS: no current films for  review  PLAN: present time he is doing well with no evidence of disease. I've asked to see him back in 6 months for follow-up and will start once your follow-up visits. I've also asked him to obtain a chest x-ray yearly from his PMD. Patient is to call at anytime with any concerns. He continues close follow-up care with ENT.  I would like to take this opportunity to thank you for allowing me to participate in the care of your patient.Noreene Filbert, MD

## 2018-02-08 DIAGNOSIS — C329 Malignant neoplasm of larynx, unspecified: Secondary | ICD-10-CM | POA: Diagnosis not present

## 2018-02-08 DIAGNOSIS — K219 Gastro-esophageal reflux disease without esophagitis: Secondary | ICD-10-CM | POA: Diagnosis not present

## 2018-03-29 DIAGNOSIS — Z23 Encounter for immunization: Secondary | ICD-10-CM | POA: Diagnosis not present

## 2018-04-04 DIAGNOSIS — H2513 Age-related nuclear cataract, bilateral: Secondary | ICD-10-CM | POA: Diagnosis not present

## 2018-04-04 DIAGNOSIS — H353221 Exudative age-related macular degeneration, left eye, with active choroidal neovascularization: Secondary | ICD-10-CM | POA: Diagnosis not present

## 2018-05-10 DIAGNOSIS — C329 Malignant neoplasm of larynx, unspecified: Secondary | ICD-10-CM | POA: Diagnosis not present

## 2018-05-10 DIAGNOSIS — K219 Gastro-esophageal reflux disease without esophagitis: Secondary | ICD-10-CM | POA: Diagnosis not present

## 2018-07-04 DIAGNOSIS — H353221 Exudative age-related macular degeneration, left eye, with active choroidal neovascularization: Secondary | ICD-10-CM | POA: Diagnosis not present

## 2018-07-25 ENCOUNTER — Other Ambulatory Visit: Payer: Self-pay

## 2018-07-25 ENCOUNTER — Ambulatory Visit
Admission: RE | Admit: 2018-07-25 | Discharge: 2018-07-25 | Disposition: A | Discharge status: Home / Self Care | Payer: Medicare Other | Source: Ambulatory Visit | Attending: Radiation Oncology | Admitting: Radiation Oncology

## 2018-07-25 VITALS — BP 145/73 | HR 67 | Temp 97.8°F | Resp 18 | Wt 221.0 lb

## 2018-07-25 DIAGNOSIS — C320 Malignant neoplasm of glottis: Secondary | ICD-10-CM

## 2018-07-25 DIAGNOSIS — Z923 Personal history of irradiation: Secondary | ICD-10-CM | POA: Diagnosis not present

## 2018-07-25 DIAGNOSIS — F17210 Nicotine dependence, cigarettes, uncomplicated: Secondary | ICD-10-CM | POA: Diagnosis not present

## 2018-07-25 DIAGNOSIS — Z8521 Personal history of malignant neoplasm of larynx: Secondary | ICD-10-CM | POA: Diagnosis not present

## 2018-07-25 NOTE — Progress Notes (Signed)
Radiation Oncology Follow up Note  Name: Jeffrey Torres   Date:   07/25/2018 MRN:  [id number] DOB: 05-Mar-1946    This 73 y.o. 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  REFERRING PROVIDER: Birdie Sons, MD  HPI: patient is a 73 year old male now out 1 year having completed external beam radiation therapy to his larynx for stage I laryngeal carcinoma. Seen today in routine follow-up he is doing well. He has excellent toenail he to his voice. He specifically denies head and neck pain or dysphagia..  COMPLICATIONS OF TREATMENT: none  FOLLOW UP COMPLIANCE: keeps appointments   PHYSICAL EXAM:  BP (!) 145/73   Pulse 67   Temp 97.8 F (36.6 C)   Resp 18   Wt 221 lb 0.2 oz (100.3 kg)   BMI 30.82 kg/m  Oral cavity is clear with no oral mucosal lesions noted. Indirect mirror examination shows upper right clear larynx showing cores approximately well with no evidence of disease. Neck is clear without evidence of cervical or supraclavicular adenopathy.Well-developed well-nourished patient in NAD. HEENT reveals PERLA, EOMI, discs not visualized.  Oral cavity is clear. No oral mucosal lesions are identified. Neck is clear without evidence of cervical or supraclavicular adenopathy. Lungs are clear to A&P. Cardiac examination is essentially unremarkable with regular rate and rhythm without murmur rub or thrill. Abdomen is benign with no organomegaly or masses noted. Motor sensory and DTR levels are equal and symmetric in the upper and lower extremities. Cranial nerves II through XII are grossly intact. Proprioception is intact. No peripheral adenopathy or edema is identified. No motor or sensory levels are noted. Crude visual fields are within normal range.  RADIOLOGY RESULTS: no current films for review  PLAN: present time patient is doing well with no evidence of disease. I'm please was overall  progress. He continues every 3 monthfollow-ups with ear nose and throat. I have asked to see him back in 1 year for follow-up. Patient is to call anytime with any concerns.  I would like to take this opportunity to thank you for allowing me to participate in the care of your patient.Noreene Filbert, MD

## 2018-09-12 DIAGNOSIS — M9903 Segmental and somatic dysfunction of lumbar region: Secondary | ICD-10-CM | POA: Diagnosis not present

## 2018-09-12 DIAGNOSIS — M5127 Other intervertebral disc displacement, lumbosacral region: Secondary | ICD-10-CM | POA: Diagnosis not present

## 2018-09-12 DIAGNOSIS — Z8521 Personal history of malignant neoplasm of larynx: Secondary | ICD-10-CM | POA: Diagnosis not present

## 2018-09-12 DIAGNOSIS — K219 Gastro-esophageal reflux disease without esophagitis: Secondary | ICD-10-CM | POA: Diagnosis not present

## 2018-09-14 DIAGNOSIS — M5127 Other intervertebral disc displacement, lumbosacral region: Secondary | ICD-10-CM | POA: Diagnosis not present

## 2018-09-14 DIAGNOSIS — M9903 Segmental and somatic dysfunction of lumbar region: Secondary | ICD-10-CM | POA: Diagnosis not present

## 2018-09-16 DIAGNOSIS — M9903 Segmental and somatic dysfunction of lumbar region: Secondary | ICD-10-CM | POA: Diagnosis not present

## 2018-09-16 DIAGNOSIS — M5127 Other intervertebral disc displacement, lumbosacral region: Secondary | ICD-10-CM | POA: Diagnosis not present

## 2018-09-19 DIAGNOSIS — M5127 Other intervertebral disc displacement, lumbosacral region: Secondary | ICD-10-CM | POA: Diagnosis not present

## 2018-09-19 DIAGNOSIS — M9903 Segmental and somatic dysfunction of lumbar region: Secondary | ICD-10-CM | POA: Diagnosis not present

## 2018-09-22 DIAGNOSIS — M9903 Segmental and somatic dysfunction of lumbar region: Secondary | ICD-10-CM | POA: Diagnosis not present

## 2018-09-22 DIAGNOSIS — M5127 Other intervertebral disc displacement, lumbosacral region: Secondary | ICD-10-CM | POA: Diagnosis not present

## 2018-09-26 DIAGNOSIS — M9903 Segmental and somatic dysfunction of lumbar region: Secondary | ICD-10-CM | POA: Diagnosis not present

## 2018-09-26 DIAGNOSIS — M5127 Other intervertebral disc displacement, lumbosacral region: Secondary | ICD-10-CM | POA: Diagnosis not present

## 2018-11-15 DIAGNOSIS — H353221 Exudative age-related macular degeneration, left eye, with active choroidal neovascularization: Secondary | ICD-10-CM | POA: Diagnosis not present

## 2019-01-03 ENCOUNTER — Other Ambulatory Visit: Payer: Self-pay

## 2019-01-03 ENCOUNTER — Encounter: Payer: Medicare Other | Admitting: Family Medicine

## 2019-01-03 NOTE — Progress Notes (Signed)
This encounter was created in error - please disregard.

## 2019-03-13 DIAGNOSIS — K219 Gastro-esophageal reflux disease without esophagitis: Secondary | ICD-10-CM | POA: Diagnosis not present

## 2019-03-21 DIAGNOSIS — H353221 Exudative age-related macular degeneration, left eye, with active choroidal neovascularization: Secondary | ICD-10-CM | POA: Diagnosis not present

## 2019-04-24 DIAGNOSIS — Z23 Encounter for immunization: Secondary | ICD-10-CM | POA: Diagnosis not present

## 2019-05-19 ENCOUNTER — Other Ambulatory Visit: Payer: Self-pay

## 2019-07-03 DIAGNOSIS — H353221 Exudative age-related macular degeneration, left eye, with active choroidal neovascularization: Secondary | ICD-10-CM | POA: Diagnosis not present

## 2019-07-12 ENCOUNTER — Encounter: Payer: Self-pay | Admitting: Ophthalmology

## 2019-07-12 ENCOUNTER — Other Ambulatory Visit: Payer: Self-pay

## 2019-07-14 ENCOUNTER — Other Ambulatory Visit: Payer: Self-pay

## 2019-07-14 ENCOUNTER — Other Ambulatory Visit
Admission: RE | Admit: 2019-07-14 | Discharge: 2019-07-14 | Disposition: A | Discharge status: Home / Self Care | Payer: Medicare Other | Source: Ambulatory Visit | Attending: Ophthalmology | Admitting: Ophthalmology

## 2019-07-14 DIAGNOSIS — Z01812 Encounter for preprocedural laboratory examination: Secondary | ICD-10-CM | POA: Diagnosis not present

## 2019-07-14 DIAGNOSIS — Z20822 Contact with and (suspected) exposure to covid-19: Secondary | ICD-10-CM | POA: Insufficient documentation

## 2019-07-14 NOTE — Anesthesia Preprocedure Evaluation (Addendum)
Anesthesia Evaluation  Patient identified by MRN, date of birth, ID band Patient awake    Reviewed: Allergy & Precautions, NPO status , Patient's Chart, lab work & pertinent test results  History of Anesthesia Complications Negative for: history of anesthetic complications  Airway Mallampati: III  TM Distance: >3 FB Neck ROM: Limited    Dental   Pulmonary former smoker,    breath sounds clear to auscultation       Cardiovascular (-) angina(-) DOE  Rhythm:Regular Rate:Normal     Neuro/Psych    GI/Hepatic GERD  Controlled and Medicated,  Endo/Other    Renal/GU Renal disease (Stones)     Musculoskeletal  (+) Arthritis ,   Abdominal (+) + obese (BMI 30),   Peds  Hematology   Anesthesia Other Findings Laryngeal cancer   Reproductive/Obstetrics                            Anesthesia Physical Anesthesia Plan  ASA: II  Anesthesia Plan: MAC   Post-op Pain Management:    Induction: Intravenous  PONV Risk Score and Plan: 1 and TIVA, Midazolam and Treatment may vary due to age or medical condition  Airway Management Planned: Nasal Cannula  Additional Equipment:   Intra-op Plan:   Post-operative Plan:   Informed Consent: I have reviewed the patients History and Physical, chart, labs and discussed the procedure including the risks, benefits and alternatives for the proposed anesthesia with the patient or authorized representative who has indicated his/her understanding and acceptance.       Plan Discussed with: CRNA and Anesthesiologist  Anesthesia Plan Comments:         Anesthesia Quick Evaluation

## 2019-07-15 LAB — SARS CORONAVIRUS 2 (TAT 6-24 HRS): SARS Coronavirus 2: NEGATIVE

## 2019-07-17 NOTE — Discharge Instructions (Signed)

## 2019-07-18 ENCOUNTER — Encounter: Payer: Self-pay | Admitting: Ophthalmology

## 2019-07-18 ENCOUNTER — Encounter
Admission: RE | Disposition: A | Discharge status: Home / Self Care | Payer: Self-pay | Source: Home / Self Care | Attending: Ophthalmology

## 2019-07-18 ENCOUNTER — Ambulatory Visit
Admission: RE | Admit: 2019-07-18 | Discharge: 2019-07-18 | Disposition: A | Discharge status: Home / Self Care | Payer: Medicare Other | Attending: Ophthalmology | Admitting: Ophthalmology

## 2019-07-18 ENCOUNTER — Ambulatory Visit: Payer: Medicare Other | Admitting: Anesthesiology

## 2019-07-18 ENCOUNTER — Other Ambulatory Visit: Payer: Self-pay

## 2019-07-18 DIAGNOSIS — Z880 Allergy status to penicillin: Secondary | ICD-10-CM | POA: Insufficient documentation

## 2019-07-18 DIAGNOSIS — Z8521 Personal history of malignant neoplasm of larynx: Secondary | ICD-10-CM | POA: Insufficient documentation

## 2019-07-18 DIAGNOSIS — Z87891 Personal history of nicotine dependence: Secondary | ICD-10-CM | POA: Diagnosis not present

## 2019-07-18 DIAGNOSIS — Z79899 Other long term (current) drug therapy: Secondary | ICD-10-CM | POA: Diagnosis not present

## 2019-07-18 DIAGNOSIS — Z7982 Long term (current) use of aspirin: Secondary | ICD-10-CM | POA: Insufficient documentation

## 2019-07-18 DIAGNOSIS — K219 Gastro-esophageal reflux disease without esophagitis: Secondary | ICD-10-CM | POA: Diagnosis not present

## 2019-07-18 DIAGNOSIS — H25811 Combined forms of age-related cataract, right eye: Secondary | ICD-10-CM | POA: Diagnosis not present

## 2019-07-18 DIAGNOSIS — I493 Ventricular premature depolarization: Secondary | ICD-10-CM | POA: Diagnosis not present

## 2019-07-18 DIAGNOSIS — H2511 Age-related nuclear cataract, right eye: Secondary | ICD-10-CM | POA: Diagnosis not present

## 2019-07-18 DIAGNOSIS — M1990 Unspecified osteoarthritis, unspecified site: Secondary | ICD-10-CM | POA: Diagnosis not present

## 2019-07-18 HISTORY — DX: Motion sickness, initial encounter: T75.3XXA

## 2019-07-18 HISTORY — PX: CATARACT EXTRACTION W/PHACO: SHX586

## 2019-07-18 SURGERY — PHACOEMULSIFICATION, CATARACT, WITH IOL INSERTION
Anesthesia: Monitor Anesthesia Care | Site: Eye | Laterality: Right

## 2019-07-18 MED ORDER — EPINEPHRINE PF 1 MG/ML IJ SOLN
INTRAOCULAR | Status: DC | PRN
  Administered 2019-07-18: 76 mL via OPHTHALMIC

## 2019-07-18 MED ORDER — ARMC OPHTHALMIC DILATING DROPS
1.0000 "application " | OPHTHALMIC | Status: DC | PRN
  Administered 2019-07-18 (×3): 1 via OPHTHALMIC

## 2019-07-18 MED ORDER — MIDAZOLAM HCL 2 MG/2ML IJ SOLN
INTRAMUSCULAR | Status: DC | PRN
  Administered 2019-07-18 (×2): 1 mg via INTRAVENOUS

## 2019-07-18 MED ORDER — LIDOCAINE HCL (PF) 2 % IJ SOLN
INTRAOCULAR | Status: DC | PRN
  Administered 2019-07-18: 2 mL

## 2019-07-18 MED ORDER — BRIMONIDINE TARTRATE-TIMOLOL 0.2-0.5 % OP SOLN
OPHTHALMIC | Status: DC | PRN
  Administered 2019-07-18: 1 [drp] via OPHTHALMIC

## 2019-07-18 MED ORDER — LACTATED RINGERS IV SOLN: 100.0000 mL/h | INTRAVENOUS | Status: DC

## 2019-07-18 MED ORDER — ACETAMINOPHEN 10 MG/ML IV SOLN: 1000.0000 mg | Freq: Once | INTRAVENOUS | Status: DC | PRN

## 2019-07-18 MED ORDER — MOXIFLOXACIN HCL 0.5 % OP SOLN
OPHTHALMIC | Status: DC | PRN
  Administered 2019-07-18: 0.2 mL via OPHTHALMIC

## 2019-07-18 MED ORDER — ONDANSETRON HCL 4 MG/2ML IJ SOLN: 4.0000 mg | Freq: Once | INTRAMUSCULAR | Status: DC | PRN

## 2019-07-18 MED ORDER — FENTANYL CITRATE (PF) 100 MCG/2ML IJ SOLN
INTRAMUSCULAR | Status: DC | PRN
  Administered 2019-07-18 (×2): 50 ug via INTRAVENOUS

## 2019-07-18 MED ORDER — TETRACAINE HCL 0.5 % OP SOLN
1.0000 [drp] | OPHTHALMIC | Status: DC | PRN
  Administered 2019-07-18 (×3): 1 [drp] via OPHTHALMIC

## 2019-07-18 MED ORDER — NA CHONDROIT SULF-NA HYALURON 40-17 MG/ML IO SOLN
INTRAOCULAR | Status: DC | PRN
  Administered 2019-07-18: 1 mL via INTRAOCULAR

## 2019-07-18 SURGICAL SUPPLY — 20 items
CANNULA ANT/CHMB 27G (MISCELLANEOUS) ×2 IMPLANT
CANNULA ANT/CHMB 27GA (MISCELLANEOUS) ×6 IMPLANT
GLOVE SURG LX 8.0 MICRO (GLOVE) ×2
GLOVE SURG LX STRL 8.0 MICRO (GLOVE) ×1 IMPLANT
GLOVE SURG TRIUMPH 8.0 PF LTX (GLOVE) ×3 IMPLANT
GOWN STRL REUS W/ TWL LRG LVL3 (GOWN DISPOSABLE) ×2 IMPLANT
GOWN STRL REUS W/TWL LRG LVL3 (GOWN DISPOSABLE) ×4
LENS IOL TECNIS ITEC 19.5 (Intraocular Lens) ×2 IMPLANT
MARKER SKIN DUAL TIP RULER LAB (MISCELLANEOUS) ×3 IMPLANT
NDL FILTER BLUNT 18X1 1/2 (NEEDLE) ×1 IMPLANT
NDL RETROBULBAR .5 NSTRL (NEEDLE) ×3 IMPLANT
NEEDLE FILTER BLUNT 18X 1/2SAF (NEEDLE) ×2
NEEDLE FILTER BLUNT 18X1 1/2 (NEEDLE) ×1 IMPLANT
PACK EYE AFTER SURG (MISCELLANEOUS) ×3 IMPLANT
PACK OPTHALMIC (MISCELLANEOUS) ×3 IMPLANT
PACK PORFILIO (MISCELLANEOUS) ×3 IMPLANT
SYR 3ML LL SCALE MARK (SYRINGE) ×3 IMPLANT
SYR TB 1ML LUER SLIP (SYRINGE) ×3 IMPLANT
WATER STERILE IRR 250ML POUR (IV SOLUTION) ×3 IMPLANT
WIPE NON LINTING 3.25X3.25 (MISCELLANEOUS) ×3 IMPLANT

## 2019-07-18 NOTE — Anesthesia Postprocedure Evaluation (Signed)
Anesthesia Post Note  Patient: Jeffrey Torres  Procedure(s) Performed: CATARACT EXTRACTION PHACO AND INTRAOCULAR LENS PLACEMENT (IOC) RIGHT 4.54 00:29.7 (Right Eye)     Patient location during evaluation: PACU Anesthesia Type: MAC Level of consciousness: awake and alert Pain management: pain level controlled Vital Signs Assessment: post-procedure vital signs reviewed and stable Respiratory status: spontaneous breathing, nonlabored ventilation, respiratory function stable and patient connected to nasal cannula oxygen Cardiovascular status: stable and blood pressure returned to baseline Postop Assessment: no apparent nausea or vomiting Anesthetic complications: no    [name]  [name]

## 2019-07-18 NOTE — H&P (Signed)
All labs reviewed. Abnormal studies sent to patients PCP when indicated.  Previous H&P reviewed, patient examined, there are NO CHANGES.  Symantha Steeber Porfilio1/19/20217:58 AM

## 2019-07-18 NOTE — Op Note (Signed)
PREOPERATIVE DIAGNOSIS:  Nuclear sclerotic cataract of the right eye.   POSTOPERATIVE DIAGNOSIS:  H25.11 Cataract   OPERATIVE PROCEDURE:@   SURGEON:  Birder Robson, MD.   ANESTHESIA:  Anesthesiologist: Heniser, Fredric Dine, MD CRNA: Izetta Dakin, CRNA  1.      Managed anesthesia care. 2.      0.74ml of Shugarcaine was instilled in the eye following the paracentesis.   COMPLICATIONS:  None.   TECHNIQUE:   Stop and chop   DESCRIPTION OF PROCEDURE:  The patient was examined and consented in the preoperative holding area where the aforementioned topical anesthesia was applied to the right eye and then brought back to the Operating Room where the right eye was prepped and draped in the usual sterile ophthalmic fashion and a lid speculum was placed. A paracentesis was created with the side port blade and the anterior chamber was filled with viscoelastic. A near clear corneal incision was performed with the steel keratome. A continuous curvilinear capsulorrhexis was performed with a cystotome followed by the capsulorrhexis forceps. Hydrodissection and hydrodelineation were carried out with BSS on a blunt cannula. The lens was removed in a stop and chop  technique and the remaining cortical material was removed with the irrigation-aspiration handpiece. The capsular bag was inflated with viscoelastic and the Technis [id number]  lens was placed in the capsular bag without complication. The remaining viscoelastic was removed from the eye with the irrigation-aspiration handpiece. The wounds were hydrated. The anterior chamber was flushed with BSS and the eye was inflated to physiologic pressure. 0.81ml of Vigamox was placed in the anterior chamber. The wounds were found to be water tight. The eye was dressed with Combigan. The patient was given protective glasses to wear throughout the day and a shield with which to sleep tonight. The patient was also given drops with which to begin a drop regimen today and  will follow-up with me in one day. Implant Name Type Inv. Item Serial No. Manufacturer Lot No. LRB No. Used Action  LENS IOL DIOP 19.5 - [id number] Intraocular Lens LENS IOL DIOP 19.5 [id number] AMO  Right 1 Implanted   Procedure(s): CATARACT EXTRACTION PHACO AND INTRAOCULAR LENS PLACEMENT (IOC) RIGHT 4.54 00:29.7 (Right)  Electronically signed: Birder Robson 07/18/2019 8:29 AM

## 2019-07-18 NOTE — Transfer of Care (Signed)
Immediate Anesthesia Transfer of Care Note  Patient: Jeffrey Torres  Procedure(s) Performed: CATARACT EXTRACTION PHACO AND INTRAOCULAR LENS PLACEMENT (IOC) RIGHT 4.54 00:29.7 (Right Eye)  Patient Location: PACU  Anesthesia Type: MAC  Level of Consciousness: awake, alert  and patient cooperative  Airway and Oxygen Therapy: Patient Spontanous Breathing and Patient connected to supplemental oxygen  Post-op Assessment: Post-op Vital signs reviewed, Patient's Cardiovascular Status Stable, Respiratory Function Stable, Patent Airway and No signs of Nausea or vomiting  Post-op Vital Signs: Reviewed and stable  Complications: No apparent anesthesia complications

## 2019-07-18 NOTE — Anesthesia Procedure Notes (Signed)
Procedure Name: MAC Performed by: Izetta Dakin, CRNA Pre-anesthesia Checklist: Timeout performed, Patient being monitored, Suction available and Patient identified Patient Re-evaluated:Patient Re-evaluated prior to induction Oxygen Delivery Method: Nasal cannula

## 2019-07-19 ENCOUNTER — Encounter: Payer: Self-pay | Admitting: *Deleted

## 2019-07-19 NOTE — Progress Notes (Signed)
Subjective:   Jeffrey Torres is a 74 y.o. male who presents for Medicare Annual/Subsequent preventive examination.    This visit is being conducted through telemedicine due to the COVID-19 pandemic. This patient has given me verbal consent via doximity to conduct this visit, patient states they are participating from their home address. Some vital signs may be absent or patient reported.    Patient identification: identified by name, DOB, and current address  Review of Systems:  N/A  Cardiac Risk Factors include: advanced age (>54men, >75 women);male gender     Objective:    Vitals: There were no vitals taken for this visit.  There is no height or weight on file to calculate BMI. Unable to obtain vitals due to visit being conducted via telephonically.   Advanced Directives 07/20/2019 07/18/2019 07/25/2018 01/24/2018 12/28/2017 08/04/2017 04/15/2017  Does Patient Have a Medical Advance Directive? No No No No Yes No No  Type of Advance Directive - - - - Press photographer;Living will - -  Copy of [place] in Chart? - - - - No - copy requested - -  Would patient like information on creating a medical advance directive? No - Patient declined Yes (MAU/Ambulatory/Procedural Areas - Information given) No - Patient declined No - Patient declined - No - Patient declined No - Patient declined    Tobacco Social History   Tobacco Use  Smoking Status Former Smoker  . Packs/day: 1.00  . Types: Cigarettes  . Quit date: 12/2017  . Years since quitting: 1.5  Smokeless Tobacco Never Used  Tobacco Comment   started age 33 yo.previously smoked carton a week     Counseling given: Not Answered Comment: started age 53 yo.previously smoked carton a week   Clinical Intake:  Pre-visit preparation completed: Yes  Pain : No/denies pain Pain Score: 0-No pain     Nutritional Risks: None Diabetes: No  How often do you need to have someone help you when you read  instructions, pamphlets, or other written materials from your doctor or pharmacy?: 1 - Never  Interpreter Needed?: No  Information entered by :: [facility], LPN  Past Medical History:  Diagnosis Date  . Arthritis   . Cancer ([place])    larynx ca  . GERD (gastroesophageal reflux disease)   . Hernia, inguinal, bilateral   . History of chicken pox   . History of kidney stones   . History of measles   . History of mumps   . Macular degeneration of both eyes   . Motion sickness    ocean boat  . PVC (premature ventricular contraction)    Past Surgical History:  Procedure Laterality Date  . CARDIAC CATHETERIZATION  2010   normal per patient report  . CATARACT EXTRACTION W/PHACO Right 07/18/2019   Procedure: CATARACT EXTRACTION PHACO AND INTRAOCULAR LENS PLACEMENT (IOC) RIGHT 4.54 00:29.7;  Surgeon: Birder Robson, MD;  Location: [facility];  Service: Ophthalmology;  Laterality: Right;  . COLONOSCOPY  06/24/2006   Dr. Bary Castilla. Multiple benign appearing 36mm polyps in the cecum and in the rectum. -Three 22mm polyps in the transversed colon, in the tranverse colon, proximal and in the distal transverse colon. Resected and retrieved.  . Kidney stones removed  2001   [facility]  . LARYNGOSCOPY Right 04/01/2017   Procedure: SUSPENSION LARYNGOSCOPY WITH MICROFLAP EXCISION;  Surgeon: Carloyn Manner, MD;  Location: [facility];  Service: ENT;  Laterality: Right;  . sinus surgery   2002   [facility]; removal of polyps  . TONSILLECTOMY  1954  . Tubular adenoma removed  06/24/2006   Family History  Problem Relation Age of Onset  . Lung cancer Mother   . Heart disease Father    Social History   Socioeconomic History  . Marital status: Married    Spouse name: Not on file  . Number of children: 2  . Years of education: Not on file  . Highest education level: 12th grade  Occupational History  . Occupation: Truck Geophysicist/field seismologist  Tobacco Use  . Smoking status: Former Smoker    Packs/day: 1.00     Types: Cigarettes    Quit date: 12/2017    Years since quitting: 1.5  . Smokeless tobacco: Never Used  . Tobacco comment: started age 89 yo.previously smoked carton a week  Substance and Sexual Activity  . Alcohol use: Not Currently    Alcohol/week: 0.0 standard drinks  . Drug use: No  . Sexual activity: Not on file  Other Topics Concern  . 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 Difficulty of Paying Living Expenses: Not hard at all  Food Insecurity: No Food Insecurity  . Worried About Charity fundraiser in the Last Year: Never true  . Ran Out of Food in the Last Year: Never true  Transportation Needs: No Transportation Needs  . Lack of Transportation (Medical): No  . Lack of Transportation (Non-Medical): No  Physical Activity: Inactive  . Days of Exercise per Week: 0 days  . Minutes of Exercise per Session: 0 min  Stress: No Stress Concern Present  . Feeling of Stress : Not at all  Social Connections: Somewhat Isolated  . Frequency of Communication with Friends and Family: Twice a week  . Frequency of Social Gatherings with Friends and Family: Once a week  . Attends Religious Services: Never  . Active Member of Clubs or Organizations: No  . Attends Archivist Meetings: Never  . Marital Status: Married    Outpatient Encounter Medications as of 07/20/2019  Medication Sig  . aspirin 81 MG tablet Take 81 mg by mouth daily.  . fluticasone (FLONASE) 50 MCG/ACT nasal spray Place 2 sprays into both nostrils every morning.  Marland Kitchen gatifloxacin (ZYMAXID) 0.5 % SOLN Place 1 drop into the right eye 4 (four) times daily.  . Multiple Vitamins-Minerals (PRESERVISION AREDS 2 PO) Take 2 capsules by mouth every morning.  Marland Kitchen omeprazole (PRILOSEC) 40 MG capsule Take 40 mg by mouth every morning. 30 MIN BEFORE EATING   No facility-administered encounter medications on  file as of 07/20/2019.    Activities of Daily Living In your present state of health, do you have any difficulty performing the following activities: 07/20/2019 07/18/2019  Hearing? N N  Vision? Y N  Comment Just had cataract removed from right eye. -  Difficulty concentrating or making decisions? N N  Walking or climbing stairs? N N  Dressing or bathing? N N  Doing errands, shopping? N -  Preparing Food and eating ? N -  Using the Toilet? N -  In the past six months, have you accidently leaked urine? N -  Do you have problems with loss of bowel control? N -  Managing your Medications? N -  Managing your Finances? N -  Housekeeping or managing your Housekeeping? N -  Some recent data might be hidden  Patient Care Team: Birdie Sons, MD as PCP - General (Family Medicine) Birder Robson, MD as Referring Physician (Ophthalmology) Carloyn Manner, MD as Referring Physician (Otolaryngology) Noreene Filbert, MD as Referring Physician (Radiation Oncology) Isaias Sakai, MD as Referring Physician (Ophthalmology)   Assessment:   This is a routine wellness examination for Rowin.  Exercise Activities and Dietary recommendations Current Exercise Habits: The patient does not participate in regular exercise at present, Exercise limited by: None identified  Goals    . DIET - REDUCE CALORIE INTAKE     Recommend continuing current diet plan of avoiding fried foods and junk food to help aid in weight loss.     . Increase water intake     Recommend increasing water intake to 6-8 glasses a day.       Fall Risk: Fall Risk  07/20/2019 05/19/2019 01/24/2018 12/28/2017 12/15/2016  Falls in the past year? 0 0 No No No  Comment - Emmi Telephone Survey: data to providers prior to load - - -  Number falls in past yr: 0 - - - -  Injury with Fall? 0 - - - -    FALL RISK PREVENTION PERTAINING TO THE HOME:  Any stairs in or around the home? No  If so, are there any without handrails?  N/A  Home free of loose throw rugs in walkways, pet beds, electrical cords, etc? Yes  Adequate lighting in your home to reduce risk of falls? Yes   ASSISTIVE DEVICES UTILIZED TO PREVENT FALLS:  Life alert? No  Use of a cane, walker or w/c? No  Grab bars in the bathroom? No  Shower chair or bench in shower? No  Elevated toilet seat or a handicapped toilet? Yes   TIMED UP AND GO:  Was the test performed? No .    Depression Screen PHQ 2/9 Scores 07/20/2019 07/20/2019 01/24/2018 12/28/2017  PHQ - 2 Score 0 0 0 0  PHQ- 9 Score - - - -    Cognitive Function     6CIT Screen 07/20/2019 12/15/2016  What Year? 0 points 0 points  What month? 0 points 0 points  What time? 0 points 0 points  Count back from 20 0 points 0 points  Months in reverse 0 points 0 points  Repeat phrase 4 points 0 points  Total Score 4 0    Immunization History  Administered Date(s) Administered  . Influenza, High Dose Seasonal PF 03/25/2017, 03/29/2018, 04/24/2019  . Pneumococcal Conjugate-13 04/26/2014  . Pneumococcal Polysaccharide-23 05/28/2012    Qualifies for Shingles Vaccine? Yes . Due for Shingrix. Pt has been advised to call insurance company to determine out of pocket expense. Advised may also receive vaccine at local pharmacy or Health Dept. Verbalized acceptance and understanding.  Tdap: Although this vaccine is not a covered service during a Wellness Exam, does the patient still wish to receive this vaccine today?  No . Advised may receive this vaccine at local pharmacy or Health Dept. Aware to provide a copy of the vaccination record if obtained from local pharmacy or Health Dept. Verbalized acceptance and understanding.  Flu Vaccine: Completed series  Pneumococcal Vaccine: Completed series   Screening Tests Health Maintenance  Topic Date Due  . TETANUS/TDAP  07/19/2020 (Originally 03/13/1965)  . COLONOSCOPY  08/05/2019  . INFLUENZA VACCINE  Completed  . Hepatitis C Screening  Completed  .  PNA vac Low Risk Adult  Completed   Cancer Screenings:  Colorectal Screening: Completed 08/04/16. Repeat every 3 years.  Lung  Cancer Screening: (Low Dose CT Chest recommended if Age 25-80 years, 30 pack-year currently smoking OR have quit w/in 15years.) does qualify. An Epic message has been sent to Burgess Estelle, RN (Oncology Nurse Navigator) regarding the possible need for this exam. Raquel Sarna will review the patient's chart to determine if the patient truly qualifies for the exam. If the patient qualifies, Raquel Sarna will order the Low Dose CT of the chest to facilitate the scheduling of this exam.  Additional Screening:  Hepatitis C Screening: Up to date  Vision Screening: Recommended annual ophthalmology exams for early detection of glaucoma and other disorders of the eye.  Dental Screening: Recommended annual dental exams for proper oral hygiene  Community Resource Referral:  CRR required this visit?  No        Plan:  I have personally reviewed and addressed the Medicare Annual Wellness questionnaire and have noted the following in the patient's chart:  A. Medical and social history B. Use of alcohol, tobacco or illicit drugs  C. Current medications and supplements D. Functional ability and status E.  Nutritional status F.  Physical activity G. Advance directives H. List of other physicians I.  Hospitalizations, surgeries, and ER visits in previous 12 months J.  [place] such as hearing and vision if needed, cognitive and depression L. Referrals and appointments   In addition, I have reviewed and discussed with patient certain preventive protocols, quality metrics, and best practice recommendations. A written personalized care plan for preventive services as well as general preventive health recommendations were provided to patient.   Glendora Score, [place]  02/29/4096 Nurse Health Advisor   Nurse Notes: None.

## 2019-07-20 ENCOUNTER — Other Ambulatory Visit: Payer: Self-pay

## 2019-07-20 ENCOUNTER — Ambulatory Visit (INDEPENDENT_AMBULATORY_CARE_PROVIDER_SITE_OTHER): Payer: Medicare Other

## 2019-07-20 DIAGNOSIS — Z0000 Encounter for general adult medical examination without abnormal findings: Secondary | ICD-10-CM

## 2019-07-20 NOTE — Patient Instructions (Signed)
Jeffrey Torres , Thank you for taking time to come for your Medicare Wellness Visit. I appreciate your ongoing commitment to your health goals. Please review the following plan we discussed and let me know if I can assist you in the future.   Screening recommendations/referrals: Colonoscopy: Up to date, due 07/2019 Recommended yearly ophthalmology/optometry visit for glaucoma screening and checkup Recommended yearly dental visit for hygiene and checkup  Vaccinations: Influenza vaccine: Up to date Pneumococcal vaccine: Completed series Tdap vaccine: Pt declines today.  Shingles vaccine: Pt declines today.     Advanced directives: Advance directive discussed with you today. Even though you declined this today please call our office should you change your mind and we can give you the proper paperwork for you to fill out.  Conditions/risks identified: Recommend to cut back on fried and junk foods to help aid with weight loss.   Next appointment: 08/14/19 @ 9:40 AM with Dr Caryn Section. Declined scheduling an AWV for 2022 at this time.   Preventive Care 26 Years and Older, Male Preventive care refers to lifestyle choices and visits with your health care provider that can promote health and wellness. What does preventive care include?  A yearly physical exam. This is also called an annual well check.  Dental exams once or twice a year.  Routine eye exams. Ask your health care provider how often you should have your eyes checked.  Personal lifestyle choices, including:  Daily care of your teeth and gums.  Regular physical activity.  Eating a healthy diet.  Avoiding tobacco and drug use.  Limiting alcohol use.  Practicing safe sex.  Taking low doses of aspirin every day.  Taking vitamin and mineral supplements as recommended by your health care provider. What happens during an annual well check? The services and screenings done by your health care provider during your annual well check  will depend on your age, overall health, lifestyle risk factors, and family history of disease. Counseling  Your health care provider may ask you questions about your:  Alcohol use.  Tobacco use.  Drug use.  Emotional well-being.  Home and relationship well-being.  Sexual activity.  Eating habits.  History of falls.  Memory and ability to understand (cognition).  Work and work Statistician. Screening  You may have the following tests or measurements:  Height, weight, and BMI.  Blood pressure.  Lipid and cholesterol levels. These may be checked every 5 years, or more frequently if you are over 66 years old.  Skin check.  Lung cancer screening. You may have this screening every year starting at age 73 if you have a 30-pack-year history of smoking and currently smoke or have quit within the past 15 years.  Fecal occult blood test (FOBT) of the stool. You may have this test every year starting at age 73.  Flexible sigmoidoscopy or colonoscopy. You may have a sigmoidoscopy every 5 years or a colonoscopy every 10 years starting at age 73.  Prostate cancer screening. Recommendations will vary depending on your family history and other risks.  Hepatitis C blood test.  Hepatitis B blood test.  Sexually transmitted disease (STD) testing.  Diabetes screening. This is done by checking your blood sugar (glucose) after you have not eaten for a while (fasting). You may have this done every 1-3 years.  Abdominal aortic aneurysm (AAA) screening. You may need this if you are a current or former smoker.  Osteoporosis. You may be screened starting at age 73 if you are at high  risk. Talk with your health care provider about your test results, treatment options, and if necessary, the need for more tests. Vaccines  Your health care provider may recommend certain vaccines, such as:  Influenza vaccine. This is recommended every year.  Tetanus, diphtheria, and acellular pertussis  (Tdap, Td) vaccine. You may need a Td booster every 10 years.  Zoster vaccine. You may need this after age 73.  Pneumococcal 13-valent conjugate (PCV13) vaccine. One dose is recommended after age 73.  Pneumococcal polysaccharide (PPSV23) vaccine. One dose is recommended after age 73. Talk to your health care provider about which screenings and vaccines you need and how often you need them. This information is not intended to replace advice given to you by your health care provider. Make sure you discuss any questions you have with your health care provider. Document Released: 07/12/2015 Document Revised: 03/04/2016 Document Reviewed: 04/16/2015 Elsevier Interactive Patient Education  2017 [place] Prevention in the Home Falls can cause injuries. They can happen to people of all ages. There are many things you can do to make your home safe and to help prevent falls. What can I do on the outside of my home?  Regularly fix the edges of walkways and driveways and fix any cracks.  Remove anything that might make you trip as you walk through a door, such as a raised step or threshold.  Trim any bushes or trees on the path to your home.  Use bright outdoor lighting.  Clear any walking paths of anything that might make someone trip, such as rocks or tools.  Regularly check to see if handrails are loose or broken. Make sure that both sides of any steps have handrails.  Any raised decks and porches should have guardrails on the edges.  Have any leaves, snow, or ice cleared regularly.  Use sand or salt on walking paths during winter.  Clean up any spills in your garage right away. This includes oil or grease spills. What can I do in the bathroom?  Use night lights.  Install grab bars by the toilet and in the tub and shower. Do not use towel bars as grab bars.  Use non-skid mats or decals in the tub or shower.  If you need to sit down in the shower, use a plastic, non-slip  stool.  Keep the floor dry. Clean up any water that spills on the floor as soon as it happens.  Remove soap buildup in the tub or shower regularly.  Attach bath mats securely with double-sided non-slip rug tape.  Do not have throw rugs and other things on the floor that can make you trip. What can I do in the bedroom?  Use night lights.  Make sure that you have a light by your bed that is easy to reach.  Do not use any sheets or blankets that are too big for your bed. They should not hang down onto the floor.  Have a firm chair that has side arms. You can use this for support while you get dressed.  Do not have throw rugs and other things on the floor that can make you trip. What can I do in the kitchen?  Clean up any spills right away.  Avoid walking on wet floors.  Keep items that you use a lot in easy-to-reach places.  If you need to reach something above you, use a strong step stool that has a grab bar.  Keep electrical cords out of the way.  Do not use floor polish or wax that makes floors slippery. If you must use wax, use non-skid floor wax.  Do not have throw rugs and other things on the floor that can make you trip. What can I do with my stairs?  Do not leave any items on the stairs.  Make sure that there are handrails on both sides of the stairs and use them. Fix handrails that are broken or loose. Make sure that handrails are as long as the stairways.  Check any carpeting to make sure that it is firmly attached to the stairs. Fix any carpet that is loose or worn.  Avoid having throw rugs at the top or bottom of the stairs. If you do have throw rugs, attach them to the floor with carpet tape.  Make sure that you have a light switch at the top of the stairs and the bottom of the stairs. If you do not have them, ask someone to add them for you. What else can I do to help prevent falls?  Wear shoes that:  Do not have high heels.  Have rubber bottoms.  Are  comfortable and fit you well.  Are closed at the toe. Do not wear sandals.  If you use a stepladder:  Make sure that it is fully opened. Do not climb a closed stepladder.  Make sure that both sides of the stepladder are locked into place.  Ask someone to hold it for you, if possible.  Clearly mark and make sure that you can see:  Any grab bars or handrails.  First and last steps.  Where the edge of each step is.  Use tools that help you move around (mobility aids) if they are needed. These include:  Canes.  Walkers.  Scooters.  Crutches.  Turn on the lights when you go into a dark area. Replace any light bulbs as soon as they burn out.  Set up your furniture so you have a clear path. Avoid moving your furniture around.  If any of your floors are uneven, fix them.  If there are any pets around you, be aware of where they are.  Review your medicines with your doctor. Some medicines can make you feel dizzy. This can increase your chance of falling. Ask your doctor what other things that you can do to help prevent falls. This information is not intended to replace advice given to you by your health care provider. Make sure you discuss any questions you have with your health care provider. Document Released: 04/11/2009 Document Revised: 11/21/2015 Document Reviewed: 07/20/2014 Elsevier Interactive Patient Education  2017 [employer].

## 2019-07-27 DIAGNOSIS — H2512 Age-related nuclear cataract, left eye: Secondary | ICD-10-CM | POA: Diagnosis not present

## 2019-08-02 ENCOUNTER — Encounter: Payer: Self-pay | Admitting: Ophthalmology

## 2019-08-03 ENCOUNTER — Encounter: Payer: Self-pay | Admitting: Ophthalmology

## 2019-08-03 ENCOUNTER — Other Ambulatory Visit: Payer: Self-pay

## 2019-08-04 ENCOUNTER — Other Ambulatory Visit: Payer: Self-pay

## 2019-08-04 ENCOUNTER — Other Ambulatory Visit
Admission: RE | Admit: 2019-08-04 | Discharge: 2019-08-04 | Disposition: A | Discharge status: Home / Self Care | Payer: Medicare Other | Source: Ambulatory Visit | Attending: Ophthalmology | Admitting: Ophthalmology

## 2019-08-04 DIAGNOSIS — Z01812 Encounter for preprocedural laboratory examination: Secondary | ICD-10-CM | POA: Insufficient documentation

## 2019-08-04 DIAGNOSIS — Z20822 Contact with and (suspected) exposure to covid-19: Secondary | ICD-10-CM | POA: Diagnosis not present

## 2019-08-04 LAB — SARS CORONAVIRUS 2 (TAT 6-24 HRS): SARS Coronavirus 2: NEGATIVE

## 2019-08-07 ENCOUNTER — Ambulatory Visit
Admission: RE | Admit: 2019-08-07 | Discharge: 2019-08-07 | Disposition: A | Discharge status: Home / Self Care | Payer: Medicare Other | Source: Ambulatory Visit | Attending: Radiation Oncology | Admitting: Radiation Oncology

## 2019-08-07 ENCOUNTER — Other Ambulatory Visit: Payer: Self-pay

## 2019-08-07 ENCOUNTER — Encounter: Payer: Self-pay | Admitting: Radiation Oncology

## 2019-08-07 VITALS — BP 155/70 | HR 40 | Temp 98.9°F | Resp 20 | Wt 198.9 lb

## 2019-08-07 DIAGNOSIS — C329 Malignant neoplasm of larynx, unspecified: Secondary | ICD-10-CM

## 2019-08-07 DIAGNOSIS — Z8521 Personal history of malignant neoplasm of larynx: Secondary | ICD-10-CM | POA: Diagnosis not present

## 2019-08-07 DIAGNOSIS — Z923 Personal history of irradiation: Secondary | ICD-10-CM | POA: Diagnosis not present

## 2019-08-07 DIAGNOSIS — F17210 Nicotine dependence, cigarettes, uncomplicated: Secondary | ICD-10-CM | POA: Diagnosis not present

## 2019-08-07 NOTE — Progress Notes (Signed)
Radiation Oncology Follow up Note  Name: Jeffrey Torres   Date:   08/07/2019 MRN:  [id number] DOB: 04/06/1946    This 74 y.o. 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  REFERRING PROVIDER: Birdie Sons, MD  HPI: Patient is a 74 year old male now at 2 years having completed external beam radiation therapy for stage I squamous cell carcinoma larynx seen today in routine follow-up he is doing well his total quality is good.  He specifically denies head and neck pain or dysphagia.  He continues routine upper endoscopy by ENT which showed no evidence of disease..  Patient has not had any recent imaging.  COMPLICATIONS OF TREATMENT: none  FOLLOW UP COMPLIANCE: keeps appointments   PHYSICAL EXAM:  BP (!) 155/70 (BP Location: Right Arm, Patient Position: Sitting, Cuff Size: Large)   Pulse (!) 40   Temp 98.9 F (37.2 C) (Tympanic)   Resp 20   Wt 198 lb 14.4 oz (90.2 kg)   BMI 27.74 kg/m  No evidence of cervical or supraclavicular adenopathy is identified.  Well-developed well-nourished patient in NAD. HEENT reveals PERLA, EOMI, discs not visualized.  Oral cavity is clear. No oral mucosal lesions are identified. Neck is clear without evidence of cervical or supraclavicular adenopathy. Lungs are clear to A&P. Cardiac examination is essentially unremarkable with regular rate and rhythm without murmur rub or thrill. Abdomen is benign with no organomegaly or masses noted. Motor sensory and DTR levels are equal and symmetric in the upper and lower extremities. Cranial nerves II through XII are grossly intact. Proprioception is intact. No peripheral adenopathy or edema is identified. No motor or sensory levels are noted. Crude visual fields are within normal range.  RADIOLOGY RESULTS: No current films to review  PLAN: Present time patient is doing well with no evidence of disease 2 years out.  Has  excellent tunnel quality of his voice.  I am pleased with his overall progress.  Of asked to see him back in 1 year for follow-up.  He continues close follow-up care with ENT.  Patient knows to call with any concerns.  I would like to take this opportunity to thank you for allowing me to participate in the care of your patient.Noreene Filbert, MD

## 2019-08-08 ENCOUNTER — Ambulatory Visit: Payer: Medicare Other | Admitting: Anesthesiology

## 2019-08-08 ENCOUNTER — Encounter: Payer: Self-pay | Admitting: Ophthalmology

## 2019-08-08 ENCOUNTER — Other Ambulatory Visit: Payer: Self-pay

## 2019-08-08 ENCOUNTER — Encounter
Admission: RE | Disposition: A | Discharge status: Home / Self Care | Payer: Self-pay | Source: Home / Self Care | Attending: Ophthalmology

## 2019-08-08 ENCOUNTER — Ambulatory Visit
Admission: RE | Admit: 2019-08-08 | Discharge: 2019-08-08 | Disposition: A | Discharge status: Home / Self Care | Payer: Medicare Other | Attending: Ophthalmology | Admitting: Ophthalmology

## 2019-08-08 DIAGNOSIS — Z79899 Other long term (current) drug therapy: Secondary | ICD-10-CM | POA: Insufficient documentation

## 2019-08-08 DIAGNOSIS — Z87891 Personal history of nicotine dependence: Secondary | ICD-10-CM | POA: Diagnosis not present

## 2019-08-08 DIAGNOSIS — H2512 Age-related nuclear cataract, left eye: Secondary | ICD-10-CM | POA: Insufficient documentation

## 2019-08-08 DIAGNOSIS — Z880 Allergy status to penicillin: Secondary | ICD-10-CM | POA: Diagnosis not present

## 2019-08-08 DIAGNOSIS — Z7982 Long term (current) use of aspirin: Secondary | ICD-10-CM | POA: Diagnosis not present

## 2019-08-08 DIAGNOSIS — K219 Gastro-esophageal reflux disease without esophagitis: Secondary | ICD-10-CM | POA: Insufficient documentation

## 2019-08-08 HISTORY — DX: Cardiac murmur, unspecified: R01.1

## 2019-08-08 HISTORY — DX: Personal history of other infectious and parasitic diseases: Z86.19

## 2019-08-08 HISTORY — DX: Unspecified hearing loss, unspecified ear: H91.90

## 2019-08-08 HISTORY — PX: CATARACT EXTRACTION W/PHACO: SHX586

## 2019-08-08 SURGERY — PHACOEMULSIFICATION, CATARACT, WITH IOL INSERTION
Anesthesia: Monitor Anesthesia Care | Site: Eye | Laterality: Left

## 2019-08-08 MED ORDER — EPINEPHRINE PF 1 MG/ML IJ SOLN
INTRAOCULAR | Status: DC | PRN
  Administered 2019-08-08: 200 mL via OPHTHALMIC

## 2019-08-08 MED ORDER — NA CHONDROIT SULF-NA HYALURON 40-17 MG/ML IO SOLN
INTRAOCULAR | Status: DC | PRN
  Administered 2019-08-08: 1 mL via INTRAOCULAR

## 2019-08-08 MED ORDER — CARBACHOL 0.01 % IO SOLN
INTRAOCULAR | Status: DC | PRN
  Administered 2019-08-08: 0.5 mL via INTRAOCULAR

## 2019-08-08 MED ORDER — SODIUM CHLORIDE 0.9 % IV SOLN: INTRAVENOUS | Status: DC

## 2019-08-08 MED ORDER — MIDAZOLAM HCL 2 MG/2ML IJ SOLN
INTRAMUSCULAR | Status: AC
  Filled 2019-08-08: qty 2

## 2019-08-08 MED ORDER — MOXIFLOXACIN HCL 0.5 % OP SOLN: 1.0000 [drp] | Freq: Once | OPHTHALMIC | Status: DC

## 2019-08-08 MED ORDER — FENTANYL CITRATE (PF) 100 MCG/2ML IJ SOLN
INTRAMUSCULAR | Status: AC
  Filled 2019-08-08: qty 2

## 2019-08-08 MED ORDER — MOXIFLOXACIN HCL 0.5 % OP SOLN
OPHTHALMIC | Status: AC
  Filled 2019-08-08: qty 3

## 2019-08-08 MED ORDER — NA CHONDROIT SULF-NA HYALURON 40-17 MG/ML IO SOLN
INTRAOCULAR | Status: AC
  Filled 2019-08-08: qty 5

## 2019-08-08 MED ORDER — TETRACAINE HCL 0.5 % OP SOLN
1.0000 [drp] | OPHTHALMIC | Status: DC | PRN
  Administered 2019-08-08: 1 [drp] via OPHTHALMIC

## 2019-08-08 MED ORDER — TETRACAINE HCL 0.5 % OP SOLN
OPHTHALMIC | Status: AC
  Administered 2019-08-08: 1 [drp] via OPHTHALMIC
  Filled 2019-08-08: qty 4

## 2019-08-08 MED ORDER — MIDAZOLAM HCL 2 MG/2ML IJ SOLN
INTRAMUSCULAR | Status: DC | PRN
  Administered 2019-08-08 (×2): 1 mg via INTRAVENOUS

## 2019-08-08 MED ORDER — MOXIFLOXACIN HCL 0.5 % OP SOLN
OPHTHALMIC | Status: DC | PRN
  Administered 2019-08-08: 0.2 mL via OPHTHALMIC

## 2019-08-08 MED ORDER — EPINEPHRINE PF 1 MG/ML IJ SOLN
INTRAMUSCULAR | Status: AC
  Filled 2019-08-08: qty 10

## 2019-08-08 MED ORDER — ARMC OPHTHALMIC DILATING DROPS
1.0000 "application " | OPHTHALMIC | Status: DC | PRN
  Administered 2019-08-08 (×2): 1 via OPHTHALMIC

## 2019-08-08 MED ORDER — POVIDONE-IODINE 5 % OP SOLN
OPHTHALMIC | Status: DC | PRN
  Administered 2019-08-08: 1 via OPHTHALMIC

## 2019-08-08 MED ORDER — POVIDONE-IODINE 5 % OP SOLN
OPHTHALMIC | Status: AC
  Filled 2019-08-08: qty 150

## 2019-08-08 MED ORDER — ARMC OPHTHALMIC DILATING DROPS
OPHTHALMIC | Status: AC
  Administered 2019-08-08: 1 via OPHTHALMIC
  Filled 2019-08-08: qty 0.5

## 2019-08-08 MED ORDER — LIDOCAINE HCL (PF) 4 % IJ SOLN
INTRAMUSCULAR | Status: AC
  Filled 2019-08-08: qty 5

## 2019-08-08 MED ORDER — LIDOCAINE HCL (PF) 4 % IJ SOLN
INTRAOCULAR | Status: DC | PRN
  Administered 2019-08-08: 2 mL via OPHTHALMIC

## 2019-08-08 SURGICAL SUPPLY — 16 items
GLOVE BIO SURGEON STRL SZ8 (GLOVE) ×3 IMPLANT
GLOVE BIOGEL M 6.5 STRL (GLOVE) ×3 IMPLANT
GLOVE SURG LX 8.0 MICRO (GLOVE) ×2
GLOVE SURG LX STRL 8.0 MICRO (GLOVE) ×1 IMPLANT
GOWN STRL REUS W/ TWL LRG LVL3 (GOWN DISPOSABLE) ×2 IMPLANT
GOWN STRL REUS W/TWL LRG LVL3 (GOWN DISPOSABLE) ×4
LABEL CATARACT MEDS ST (LABEL) ×3 IMPLANT
LENS IOL TECNIS ITEC 19.5 (Intraocular Lens) ×3 IMPLANT
PACK CATARACT (MISCELLANEOUS) ×3 IMPLANT
PACK CATARACT BRASINGTON LX (MISCELLANEOUS) ×3 IMPLANT
PACK EYE AFTER SURG (MISCELLANEOUS) ×3 IMPLANT
SOL BSS BAG (MISCELLANEOUS) ×3
SOLUTION BSS BAG (MISCELLANEOUS) ×1 IMPLANT
SYR 5ML LL (SYRINGE) ×3 IMPLANT
WATER STERILE IRR 250ML POUR (IV SOLUTION) ×3 IMPLANT
WIPE NON LINTING 3.25X3.25 (MISCELLANEOUS) ×3 IMPLANT

## 2019-08-08 NOTE — Op Note (Signed)
PREOPERATIVE DIAGNOSIS:  Nuclear sclerotic cataract of the left eye.   POSTOPERATIVE DIAGNOSIS:  Nuclear sclerotic cataract of the left eye.   OPERATIVE PROCEDURE: Procedure(s): CATARACT EXTRACTION PHACO AND INTRAOCULAR LENS PLACEMENT (IOC) LEFT   SURGEON:  Birder Robson, MD.   ANESTHESIA:  Anesthesiologist: Martha Clan, MD CRNA: Aline Brochure, CRNA  1.      Managed anesthesia care. 2.     0.38ml of Shugarcaine was instilled following the paracentesis   COMPLICATIONS:  None.   TECHNIQUE:   Stop and chop   DESCRIPTION OF PROCEDURE:  The patient was examined and consented in the preoperative holding area where the aforementioned topical anesthesia was applied to the left eye and then brought back to the Operating Room where the left eye was prepped and draped in the usual sterile ophthalmic fashion and a lid speculum was placed. A paracentesis was created with the side port blade and the anterior chamber was filled with viscoelastic. A near clear corneal incision was performed with the steel keratome. A continuous curvilinear capsulorrhexis was performed with a cystotome followed by the capsulorrhexis forceps. Hydrodissection and hydrodelineation were carried out with BSS on a blunt cannula. The lens was removed in a stop and chop  technique and the remaining cortical material was removed with the irrigation-aspiration handpiece. The capsular bag was inflated with viscoelastic and the Technis ZCB00 lens was placed in the capsular bag without complication. The remaining viscoelastic was removed from the eye with the irrigation-aspiration handpiece. The wounds were hydrated. The anterior chamber was flushed with Miostat and the eye was inflated to physiologic pressure. 0.59ml Vigamox was placed in the anterior chamber. The wounds were found to be water tight. The eye was dressed with Vigamox. The patient was given protective glasses to wear throughout the day and a shield with which to sleep  tonight. The patient was also given drops with which to begin a drop regimen today and will follow-up with me in one day. Implant Name Type Inv. Item Serial No. Manufacturer Lot No. LRB No. Used Action  LENS IOL DIOP 19.5 - [id number] Intraocular Lens LENS IOL DIOP 19.5 [id number]  Left 1 Implanted    Procedure(s) with comments: CATARACT EXTRACTION PHACO AND INTRAOCULAR LENS PLACEMENT (IOC) LEFT (Left) - Lot #[id number] H [place]: 00:34.5 CDE: 4.75  Electronically signed: Birder Robson 08/08/2019 11:51 AM

## 2019-08-08 NOTE — Discharge Instructions (Addendum)
Cataract Surgery, Care After This sheet gives you information about how to care for yourself after your procedure. Your health care provider may also give you more specific instructions. If you have problems or questions, contact your health care provider. What can I expect after the procedure? After the procedure, it is common to have:  Itching.  Discomfort.  Fluid discharge.  Sensitivity to light and to touch.  Bruising in or around the eye.  Mild blurred vision. Follow these instructions at home: Eye care   Do not touch or rub your eyes.  Protect your eyes as told by your health care provider. You may be told to wear a protective eye shield or sunglasses.  Do not put a contact lens into the affected eye or eyes until your health care provider approves.  Keep the area around your eye clean and dry: ? Avoid swimming. ? Do not allow water to hit you directly in the face while showering. ? Keep soap and shampoo out of your eyes.  Check your eye every day for signs of infection. Watch for: ? Redness, swelling, or pain. ? Fluid, blood, or pus. ? Warmth. ? A bad smell. ? Vision that is getting worse. ? Sensitivity that is getting worse. Activity  Do not drive for 24 hours if you were given a sedative during your procedure.  Avoid strenuous activities, such as playing contact sports, for as long as told by your health care provider.  Do not drive or use heavy machinery until your health care provider approves.  Do not bend or lift heavy objects. Bending increases pressure in the eye. You can walk, climb stairs, and do light household chores.  Ask your health care provider when you can return to work. If you work in a dusty environment, you may be advised to wear protective eyewear for a period of time. General instructions  Take or apply over-the-counter and prescription medicines only as told by your health care provider. This includes eye drops.  Keep all follow-up  visits as told by your health care provider. This is important. Contact a health care provider if:  You have increased bruising around your eye.  You have pain that is not helped with medicine.  You have a fever.  You have redness, swelling, or pain in your eye.  You have fluid, blood, or pus coming from your incision.  Your vision gets worse.  Your sensitivity to light gets worse. Get help right away if:  You have sudden loss of vision.  You see flashes of light or spots (floaters).  You have severe eye pain.  You develop nausea or vomiting. Summary  After your procedure, it is common to have itching, discomfort, bruising, fluid discharge, or sensitivity to light.  Follow instructions from your health care provider about caring for your eye after the procedure.  Do not rub your eye after the procedure. You may need to wear eye protection or sunglasses. Do not wear contact lenses. Keep the area around your eye clean and dry.  Avoid activities that require a lot of effort. These include playing sports and lifting heavy objects.  Contact a health care provider if you have increased bruising, pain that does not go away, or a fever. Get help right away if you suddenly lose your vision, see flashes of light or spots, or have severe pain in the eye. This information is not intended to replace advice given to you by your health care provider. Make sure you discuss  any questions you have with your health care provider. Document Revised: 04/11/2019 Document Reviewed: 12/13/2017 Elsevier Patient Education  2020 [place] Anesthesia, Adult, Care After This sheet gives you information about how to care for yourself after your procedure. Your health care provider may also give you more specific instructions. If you have problems or questions, contact your health care provider. What can I expect after the procedure? After the procedure, the following side effects are  common:  Pain or discomfort at the IV site.  Nausea.  Vomiting.  Sore throat.  Trouble concentrating.  Feeling cold or chills.  Weak or tired.  Sleepiness and fatigue.  Soreness and body aches. These side effects can affect parts of the body that were not involved in surgery. Follow these instructions at home:  For at least 24 hours after the procedure:  Have a responsible adult stay with you. It is important to have someone help care for you until you are awake and alert.  Rest as needed.  Do not: ? Participate in activities in which you could fall or become injured. ? Drive. ? Use heavy machinery. ? Drink alcohol. ? Take sleeping pills or medicines that cause drowsiness. ? Make important decisions or sign legal documents. ? Take care of children on your own. Eating and drinking  Follow any instructions from your health care provider about eating or drinking restrictions.  When you feel hungry, start by eating small amounts of foods that are soft and easy to digest (bland), such as toast. Gradually return to your regular diet.  Drink enough fluid to keep your urine pale yellow.  If you vomit, rehydrate by drinking water, juice, or clear broth. General instructions  If you have sleep apnea, surgery and certain medicines can increase your risk for breathing problems. Follow instructions from your health care provider about wearing your sleep device: ? Anytime you are sleeping, including during daytime naps. ? While taking prescription pain medicines, sleeping medicines, or medicines that make you drowsy.  Return to your normal activities as told by your health care provider. Ask your health care provider what activities are safe for you.  Take over-the-counter and prescription medicines only as told by your health care provider.  If you smoke, do not smoke without supervision.  Keep all follow-up visits as told by your health care provider. This is  important. Contact a health care provider if:  You have nausea or vomiting that does not get better with medicine.  You cannot eat or drink without vomiting.  You have pain that does not get better with medicine.  You are unable to pass urine.  You develop a skin rash.  You have a fever.  You have redness around your IV site that gets worse. Get help right away if:  You have difficulty breathing.  You have chest pain.  You have blood in your urine or stool, or you vomit blood. Summary  After the procedure, it is common to have a sore throat or nausea. It is also common to feel tired.  Have a responsible adult stay with you for the first 24 hours after general anesthesia. It is important to have someone help care for you until you are awake and alert.  When you feel hungry, start by eating small amounts of foods that are soft and easy to digest (bland), such as toast. Gradually return to your regular diet.  Drink enough fluid to keep your urine pale yellow.  Return to  your normal activities as told by your health care provider. Ask your health care provider what activities are safe for you. This information is not intended to replace advice given to you by your health care provider. Make sure you discuss any questions you have with your health care provider. Document Revised: 06/18/2017 Document Reviewed: 01/29/2017 Elsevier Patient Education  [facility] Discharge Instructions  Expect mild scratchy sensation or mild soreness. DO NOT RUB YOUR EYE!  The day of surgery: . Minimal physical activity, but bed rest is not required . No reading, computer work, or close hand work . No bending, lifting, or straining. . May watch TV  For 24 hours: . No driving, legal decisions, or alcoholic beverages . Safety precautions . Eat anything you prefer: It is better to start with liquids, then soup then solid foods. . Solar shield eyeglasses should be worn  for comfort in the sunlight/patch while sleeping  Resume all regular medications including aspirin or Coumadin if these were discontinued prior to surgery. You may shower, bathe, shave, or wash your hair. Tylenol may be taken for mild discomfort. Follow Dr. Inda Coke eye drop instruction sheet as reviewed.  Call your doctor if you experience significant pain, nausea, or vomiting, fever > 101 or other signs of infection. [phone] or [phone] Specific instructions:  Follow-up Information    Birder Robson, MD. Go on 08/09/2019.   Specialty: Ophthalmology Why: at 9:00 am  Contact information: [place] [address] [phone]

## 2019-08-08 NOTE — Transfer of Care (Signed)
Immediate Anesthesia Transfer of Care Note  Patient: Jeffrey Torres  Procedure(s) Performed: CATARACT EXTRACTION PHACO AND INTRAOCULAR LENS PLACEMENT (IOC) LEFT (Left Eye)  Patient Location: Short Stay  Anesthesia Type:MAC  Level of Consciousness: awake, alert  and oriented  Airway & Oxygen Therapy: Patient Spontanous Breathing  Post-op Assessment: Post -op Vital signs reviewed and stable  Post vital signs: stable  Last Vitals:  Vitals Value Taken Time  BP 129/66 08/08/19 1153  Temp 36.2 C 08/08/19 1153  Pulse 61 08/08/19 1153  Resp 18 08/08/19 1153  SpO2 100 % 08/08/19 1153    Last Pain:  Vitals:   08/08/19 1153  TempSrc: Temporal  PainSc: 0-No pain         Complications: No apparent anesthesia complications

## 2019-08-08 NOTE — Anesthesia Preprocedure Evaluation (Signed)
Anesthesia Evaluation  Patient identified by MRN, date of birth, ID band Patient awake    Reviewed: Allergy & Precautions, NPO status , Patient's Chart, lab work & pertinent test results  History of Anesthesia Complications Negative for: history of anesthetic complications  Airway Mallampati: III  TM Distance: >3 FB Neck ROM: Limited    Dental  (+) Dental Advidsory Given   Pulmonary former smoker,    breath sounds clear to auscultation       Cardiovascular (-) angina(-) DOE negative cardio ROS   Rhythm:Regular Rate:Normal     Neuro/Psych    GI/Hepatic GERD  Controlled and Medicated,  Endo/Other    Renal/GU Renal disease (Stones)     Musculoskeletal  (+) Arthritis ,   Abdominal (+) + obese (BMI 30),   Peds  Hematology   Anesthesia Other Findings Past Medical History: No date: Arthritis No date: Cancer ([place])     Comment:  larynx ca, surgery and radiation No date: GERD (gastroesophageal reflux disease) No date: Heart murmur No date: Hernia, inguinal, bilateral No date: History of chicken pox No date: History of kidney stones No date: History of measles No date: History of mumps No date: History of shingles No date: HOH (hard of hearing) No date: Macular degeneration of both eyes No date: Motion sickness     Comment:  ocean boat No date: PVC (premature ventricular contraction)   Reproductive/Obstetrics                             Anesthesia Physical  Anesthesia Plan  ASA: II  Anesthesia Plan: MAC   Post-op Pain Management:    Induction: Intravenous  PONV Risk Score and Plan: 1 and TIVA, Midazolam and Treatment may vary due to age or medical condition  Airway Management Planned: Nasal Cannula  Additional Equipment:   Intra-op Plan:   Post-operative Plan:   Informed Consent: I have reviewed the patients History and Physical, chart, labs and discussed the procedure  including the risks, benefits and alternatives for the proposed anesthesia with the patient or authorized representative who has indicated his/her understanding and acceptance.       Plan Discussed with: CRNA and Anesthesiologist  Anesthesia Plan Comments:         Anesthesia Quick Evaluation

## 2019-08-08 NOTE — H&P (Signed)
All labs reviewed. Abnormal studies sent to patients PCP when indicated.  Previous H&P reviewed, patient examined, there are NO CHANGES.  Jeffrey Torres Porfilio2/9/202111:25 AM

## 2019-08-09 NOTE — Anesthesia Postprocedure Evaluation (Signed)
Anesthesia Post Note  Patient: Jeffrey Torres  Procedure(s) Performed: CATARACT EXTRACTION PHACO AND INTRAOCULAR LENS PLACEMENT (IOC) LEFT (Left Eye)  Patient location during evaluation: PACU Anesthesia Type: MAC Level of consciousness: awake and alert Pain management: pain level controlled Vital Signs Assessment: post-procedure vital signs reviewed and stable Respiratory status: spontaneous breathing, nonlabored ventilation, respiratory function stable and patient connected to nasal cannula oxygen Cardiovascular status: stable and blood pressure returned to baseline Postop Assessment: no apparent nausea or vomiting Anesthetic complications: no     Last Vitals:  Vitals:   08/08/19 1027 08/08/19 1153  BP: (!) 153/76 129/66  Pulse: (!) 58 61  Resp: 20 18  Temp: (!) 36.3 C (!) 36.2 C  SpO2: 99% 100%    Last Pain:  Vitals:   08/09/19 0816  TempSrc:   PainSc: 0-No pain                 Martha Clan

## 2019-08-14 ENCOUNTER — Ambulatory Visit (INDEPENDENT_AMBULATORY_CARE_PROVIDER_SITE_OTHER): Payer: Medicare Other | Admitting: Family Medicine

## 2019-08-14 ENCOUNTER — Other Ambulatory Visit: Payer: Self-pay

## 2019-08-14 ENCOUNTER — Encounter: Payer: Self-pay | Admitting: Family Medicine

## 2019-08-14 VITALS — BP 155/84 | HR 74 | Temp 97.3°F | Wt 237.6 lb

## 2019-08-14 DIAGNOSIS — I10 Essential (primary) hypertension: Secondary | ICD-10-CM | POA: Diagnosis not present

## 2019-08-14 DIAGNOSIS — I498 Other specified cardiac arrhythmias: Secondary | ICD-10-CM

## 2019-08-14 NOTE — Patient Instructions (Signed)
.   Please review the attached list of medications and notify my office if there are any errors.   . Please bring all of your medications to every appointment so we can make sure that our medication list is the same as yours.   

## 2019-08-14 NOTE — Progress Notes (Signed)
Patient: Jeffrey Torres Male    DOB: 04-05-1946   74 y.o.   MRN: [id number] Visit Date: 08/14/2019  Today's Provider: Lelon Huh, MD   Chief Complaint  Patient presents with  . Hypertension   Subjective:     HPI  Elevated Blood Pressure, follow-up:  BP Readings from Last 3 Encounters:  08/14/19 (!) 155/84  08/08/19 129/66  08/07/19 (!) 155/70    He was last seen for hypertension over 1 years ago.  BP at that visit was 155/70 Management changes since that visit include no change. He reports good compliance with treatment. He is not having side effects.  He is not exercising. He is not adherent to low salt diet.   Outside blood pressures are being checked at other doctors' offices. Patient reports BP readings have been elevated.Marland Kitchen He is experiencing none.  Patient denies chest pain, chest pressure/discomfort, irregular heart beat and palpitations.   Cardiovascular risk factors include advanced age (older than 30 for men, 85 for women) and male gender.  Use of agents associated with hypertension: none.     Weight trend: increasing steadily Wt Readings from Last 3 Encounters:  08/14/19 237 lb 9.6 oz (107.8 kg)  08/02/19 215 lb (97.5 kg)  08/07/19 198 lb 14.4 oz (90.2 kg)    Current diet: in general, an "unhealthy" diet  ------------------------------------------------------------------------  Allergies  Allergen Reactions  . Penicillins Anaphylaxis and Swelling    Did it involve swelling of the face/tongue/throat, SOB, or low BP? Yes Did it involve sudden or severe rash/hives, skin peeling, or any reaction on the inside of your mouth or nose? No Did you need to seek medical attention at a hospital or doctor's office? No When did it last happen?10 + years ago If all above answers are "NO", may proceed with cephalosporin use.      Current Outpatient Medications:  .  aspirin 81 MG tablet, Take 81 mg by mouth daily., Disp: , Rfl:  .   diphenhydramine-acetaminophen (TYLENOL PM) 25-500 MG TABS tablet, Take 1 tablet by mouth at bedtime as needed (sleep)., Disp: , Rfl:  .  fluticasone (FLONASE) 50 MCG/ACT nasal spray, Place 2 sprays into both nostrils every morning., Disp: , Rfl:  .  gatifloxacin (ZYMAXID) 0.5 % SOLN, Place 1 drop into the right eye 2 (two) times daily. , Disp: , Rfl:  .  ibuprofen (ADVIL) 200 MG tablet, Take 400 mg by mouth every 6 (six) hours as needed for headache or moderate pain., Disp: , Rfl:  .  Multiple Vitamins-Minerals (PRESERVISION AREDS 2 PO), Take 2 capsules by mouth every morning., Disp: , Rfl:  .  omeprazole (PRILOSEC) 20 MG capsule, Take 20 mg by mouth daily before breakfast., Disp: , Rfl:   Review of Systems  Constitutional: Negative.   Respiratory: Negative.   Cardiovascular: Negative.   Musculoskeletal: Negative.     Social History   Tobacco Use  . Smoking status: Former Smoker    Packs/day: 1.00    Types: Cigarettes    Quit date: 12/2017    Years since quitting: 1.6  . Smokeless tobacco: Never Used  . Tobacco comment: started age 40 yo.previously smoked carton a week  Substance Use Topics  . Alcohol use: Not Currently    Alcohol/week: 0.0 standard drinks      Objective:   BP (!) 155/84 (BP Location: Right Arm, Patient Position: Sitting, Cuff Size: Large)   Pulse 74   Temp (!) 97.3 F (36.3 C) (  Temporal)   Wt 237 lb 9.6 oz (107.8 kg)   BMI 33.14 kg/m  Vitals:   08/14/19 0920  BP: (!) 155/84  Pulse: 74  Temp: (!) 97.3 F (36.3 C)  TempSrc: Temporal  Weight: 237 lb 9.6 oz (107.8 kg)  Body mass index is 33.14 kg/m.   Physical Exam   General: Appearance:    Obese male in no acute distress  Eyes:    PERRL, conjunctiva/corneas clear, EOM's intact       Lungs:     Clear to auscultation bilaterally, respirations unlabored  Heart:    Normal heart rate. Regularly irregular. No murmurs, rubs, or gallops.   MS:   All extremities are intact.   Neurologic:   Awake, alert,  oriented x 3. No apparent focal neurological           defect.         EKG: Atrial bigeminy      Assessment & Plan    1. Essential hypertension  - EKG 12-Lead - Comprehensive metabolic panel - Lipid panel - TSH - Magnesium  Consistently elevated SBP. Anticipated starting a non-DHP calcium channel blocker or thiazide diuretic after reviewing labs  2. Atrial bigeminy Asymptomatic.  - TSH - Magnesium     Lelon Huh, MD  [facility]

## 2019-08-15 ENCOUNTER — Other Ambulatory Visit: Payer: Self-pay | Admitting: Family Medicine

## 2019-08-15 ENCOUNTER — Telehealth: Payer: Self-pay

## 2019-08-15 LAB — COMPREHENSIVE METABOLIC PANEL
ALT: 27 IU/L (ref 0–44)
AST: 22 IU/L (ref 0–40)
Albumin/Globulin Ratio: 1.6 (ref 1.2–2.2)
Albumin: 4.1 g/dL (ref 3.7–4.7)
Alkaline Phosphatase: 68 IU/L (ref 39–117)
BUN/Creatinine Ratio: 15 (ref 10–24)
BUN: 18 mg/dL (ref 8–27)
Bilirubin Total: 0.2 mg/dL (ref 0.0–1.2)
CO2: 22 mmol/L (ref 20–29)
Calcium: 9.4 mg/dL (ref 8.6–10.2)
Chloride: 103 mmol/L (ref 96–106)
Creatinine, Ser: 1.24 mg/dL (ref 0.76–1.27)
GFR calc Af Amer: 67 mL/min/{1.73_m2} (ref >59)
GFR calc non Af Amer: 58 mL/min/{1.73_m2} — ABNORMAL LOW (ref >59)
Globulin, Total: 2.6 g/dL (ref 1.5–4.5)
Glucose: 116 mg/dL — ABNORMAL HIGH (ref 65–99)
Potassium: 4.2 mmol/L (ref 3.5–5.2)
Sodium: 140 mmol/L (ref 134–144)
Total Protein: 6.7 g/dL (ref 6.0–8.5)

## 2019-08-15 LAB — LIPID PANEL
Chol/HDL Ratio: 3.4 ratio (ref 0.0–5.0)
Cholesterol, Total: 147 mg/dL (ref 100–199)
HDL: 43 mg/dL (ref >39)
LDL Chol Calc (NIH): 64 mg/dL (ref 0–99)
Triglycerides: 249 mg/dL — ABNORMAL HIGH (ref 0–149)
VLDL Cholesterol Cal: 40 mg/dL (ref 5–40)

## 2019-08-15 LAB — MAGNESIUM: Magnesium: 2.1 mg/dL (ref 1.6–2.3)

## 2019-08-15 LAB — TSH: TSH: 4.03 u[IU]/mL (ref 0.450–4.500)

## 2019-08-15 MED ORDER — DILTIAZEM HCL ER COATED BEADS 120 MG PO CP24: 120.0000 mg | ORAL_CAPSULE | Freq: Every day | ORAL | 1 refills | Status: DC

## 2019-08-15 NOTE — Telephone Encounter (Signed)
-----   Message from Birdie Sons, MD sent at 08/15/2019  7:44 AM EST ----- Is a little dehydrated, need to drink more water, otherwise labs are normal. Need to start diltiazem 120 for hypertension. Which pharmacy does he want prescription sent to?  He needs to schedule follow up 3-4 weeks to check blood pressure.

## 2019-08-15 NOTE — Telephone Encounter (Signed)
Patient advised. 4 week follow up scheduled. RX sent to [facility] pharmacy.

## 2019-09-12 ENCOUNTER — Ambulatory Visit: Payer: Medicare Other | Admitting: Family Medicine

## 2019-10-09 DIAGNOSIS — H353213 Exudative age-related macular degeneration, right eye, with inactive scar: Secondary | ICD-10-CM | POA: Diagnosis not present

## 2019-10-09 DIAGNOSIS — H353221 Exudative age-related macular degeneration, left eye, with active choroidal neovascularization: Secondary | ICD-10-CM | POA: Diagnosis not present

## 2019-11-13 DIAGNOSIS — Z8521 Personal history of malignant neoplasm of larynx: Secondary | ICD-10-CM | POA: Diagnosis not present

## 2019-11-13 DIAGNOSIS — K219 Gastro-esophageal reflux disease without esophagitis: Secondary | ICD-10-CM | POA: Diagnosis not present

## 2019-12-11 ENCOUNTER — Other Ambulatory Visit: Payer: Self-pay | Admitting: Family Medicine

## 2020-01-15 ENCOUNTER — Other Ambulatory Visit: Payer: Self-pay | Admitting: Family Medicine

## 2020-01-15 DIAGNOSIS — H353221 Exudative age-related macular degeneration, left eye, with active choroidal neovascularization: Secondary | ICD-10-CM | POA: Diagnosis not present

## 2020-01-18 ENCOUNTER — Telehealth: Payer: Self-pay | Admitting: Family Medicine

## 2020-01-18 DIAGNOSIS — I498 Other specified cardiac arrhythmias: Secondary | ICD-10-CM

## 2020-01-18 NOTE — Telephone Encounter (Signed)
Patient is calling to see based on his DOT CPE renewed and it is recommended that he have a sleep apea test.  It was also recommended that he needs to see a cardiologist. Can a referral be placed for cardiologist? Please advise CB- [phone]

## 2020-01-30 ENCOUNTER — Ambulatory Visit (INDEPENDENT_AMBULATORY_CARE_PROVIDER_SITE_OTHER): Payer: Medicare Other | Admitting: Cardiology

## 2020-01-30 ENCOUNTER — Encounter: Payer: Self-pay | Admitting: Cardiology

## 2020-01-30 ENCOUNTER — Other Ambulatory Visit: Payer: Self-pay

## 2020-01-30 VITALS — BP 160/90 | HR 59 | Ht 70.0 in | Wt 230.4 lb

## 2020-01-30 DIAGNOSIS — R9431 Abnormal electrocardiogram [ECG] [EKG]: Secondary | ICD-10-CM

## 2020-01-30 DIAGNOSIS — I10 Essential (primary) hypertension: Secondary | ICD-10-CM

## 2020-01-30 MED ORDER — HYDROCHLOROTHIAZIDE 25 MG PO TABS: 25.0000 mg | ORAL_TABLET | Freq: Every day | ORAL | 5 refills | Status: DC

## 2020-01-30 NOTE — Progress Notes (Signed)
Cardiology Office Note:    Date:  01/30/2020   ID:  Jeffrey Torres, DOB 03-24-1946, MRN [id number]  PCP:  Birdie Sons, MD  [facility] Cardiologist:  Kate Sable, MD  [place] Electrophysiologist:  None   Referring MD: Birdie Sons, MD   Chief Complaint  Patient presents with  . New Patient (Initial Visit)    Referred by Dr. Caryn Section ofr Atrial Bigeminy; Meds verbally reviewed with patient.    History of Present Illness:    Jeffrey Torres is a 74 y.o. male with a hx of GERD, hypertension, former smoker who presents due to abnormal EKG.  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  EKG during that visit on 08/14/2019 noted atrial bigeminy.  Patient required cardiology evaluation to say it is safe for him to drive due to EKG abnormality.  Patient also has a history of hypertension, was placed on Cardizem which he has taken for about 3 months now.  He denies any chest pain or shortness of breath at rest or with exertion.  Denies palpitations, dizziness.    Past Medical History:  Diagnosis Date  . Arthritis   . Cancer [facility])    larynx ca, surgery and radiation  . GERD (gastroesophageal reflux disease)   . Heart murmur   . Hernia, inguinal, bilateral   . History of chicken pox   . History of kidney stones   . History of measles   . History of mumps   . History of shingles   . HOH (hard of hearing)   . Macular degeneration of both eyes   . Motion sickness    ocean boat  . PVC (premature ventricular contraction)     Past Surgical History:  Procedure Laterality Date  . CARDIAC CATHETERIZATION  2010   normal per patient report  . CATARACT EXTRACTION W/PHACO Right 07/18/2019   Procedure: CATARACT EXTRACTION PHACO AND INTRAOCULAR LENS PLACEMENT (IOC) RIGHT 4.54 00:29.7;  Surgeon: Birder Robson, MD;  Location: [place];  Service: Ophthalmology;  Laterality: Right;  . CATARACT EXTRACTION W/PHACO Left  08/08/2019   Procedure: CATARACT EXTRACTION PHACO AND INTRAOCULAR LENS PLACEMENT ([place]) LEFT;  Surgeon: Birder Robson, MD;  Location: [facility];  Service: Ophthalmology;  Laterality: Left;  Lot #[id number] H [place]: 00:34.5 CDE: 4.75  . COLONOSCOPY  06/24/2006   Dr. Bary Castilla. Multiple benign appearing 25mm polyps in the cecum and in the rectum. -Three 35mm polyps in the transversed colon, in the tranverse colon, proximal and in the distal transverse colon. Resected and retrieved.  . CYSTOSCOPY WITH HOLMIUM LASER LITHOTRIPSY  2001   kidney stones removed, [facility]  . LARYNGOSCOPY Right 04/01/2017   Procedure: SUSPENSION LARYNGOSCOPY WITH MICROFLAP EXCISION;  Surgeon: Carloyn Manner, MD;  Location: [facility] OR[facility];  Service: ENT;  Laterality: Right;  . sinus surgery   2002   [place]; removal of polyps  . TONSILLECTOMY  1954  . Tubular adenoma removed  06/24/2006    Current Medications: Current Meds  Medication Sig  . aspirin 81 MG tablet Take 81 mg by mouth daily.  Marland Kitchen diltiazem (CARDIZEM CD) 120 MG 24 hr capsule TAKE 1 CAPSULE BY MOUTH ONCE DAILY  . diphenhydramine-acetaminophen (TYLENOL PM) 25-500 MG TABS tablet Take 1 tablet by mouth at bedtime as needed (sleep).  Marland Kitchen ibuprofen (ADVIL) 200 MG tablet Take 400 mg by mouth every 6 (six) hours as needed for headache or moderate pain.  Marland Kitchen omeprazole (PRILOSEC) 20 MG capsule Take  20 mg by mouth daily before breakfast.     Allergies:   Penicillins   Social History   Socioeconomic History  . Marital status: Married    Spouse name: Not on file  . Number of children: 2  . Years of education: Not on file  . Highest education level: 12th grade  Occupational History  . Occupation: Truck Geophysicist/field seismologist  Tobacco Use  . Smoking status: Former Smoker    Packs/day: 1.00    Types: Cigarettes    Quit date: 12/2017    Years since quitting: 2.0  . Smokeless tobacco: Never Used  . Tobacco comment: started age 71 yo.previously smoked carton a week  Vaping Use  . Vaping  Use: Never used  Substance and Sexual Activity  . Alcohol use: Not Currently    Alcohol/week: 0.0 standard drinks  . Drug use: No  . Sexual activity: Not on file  Other Topics Concern  . 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 Difficulty of Paying Living Expenses: Not hard at all  Food Insecurity: No Food Insecurity  . Worried About Charity fundraiser in the Last Year: Never true  . Ran Out of Food in the Last Year: Never true  Transportation Needs: No Transportation Needs  . Lack of Transportation (Medical): No  . Lack of Transportation (Non-Medical): No  Physical Activity: Inactive  . Days of Exercise per Week: 0 days  . Minutes of Exercise per Session: 0 min  Stress: No Stress Concern Present  . Feeling of Stress : Not at all  Social Connections: Moderately Isolated  . Frequency of Communication with Friends and Family: Twice a week  . Frequency of Social Gatherings with Friends and Family: Once a week  . Attends Religious Services: Never  . Active Member of Clubs or Organizations: No  . Attends Archivist Meetings: Never  . Marital Status: Married     Family History: The patient's family history includes Heart disease in his father; Lung cancer in his mother.  ROS:   Please see the history of present illness.     All other systems reviewed and are negative.  EKGs/Labs/Other Studies Reviewed:    The following studies were reviewed today:   EKG:  EKG is  ordered today.  The ekg ordered today demonstrates sinus bradycardia, occasional PVCs, PACs  Recent Labs: 08/14/2019: ALT 27; BUN 18; Creatinine, Ser 1.24; Magnesium 2.1; Potassium 4.2; Sodium 140; TSH 4.030  Recent Lipid Panel    Component Value Date/Time   CHOL 147 08/14/2019 1006   TRIG 249 (H) 08/14/2019 1006   HDL 43 08/14/2019 1006   CHOLHDL 3.4 08/14/2019 1006   LDLCALC 64  08/14/2019 1006    Physical Exam:    VS:  BP (!) 160/90 (BP Location: Right Arm, Patient Position: Sitting, Cuff Size: Normal)   Pulse (!) 59   Ht 5\' 10"  (1.778 m)   Wt 230 lb 6 oz (104.5 kg)   SpO2 98%   BMI 33.06 kg/m     Wt Readings from Last 3 Encounters:  01/30/20 230 lb 6 oz (104.5 kg)  08/14/19 237 lb 9.6 oz (107.8 kg)  08/02/19 215 lb (97.5 kg)     GEN:  Well nourished, well developed in no acute distress HEENT: Normal NECK: No JVD; No carotid bruits LYMPHATICS: No lymphadenopathy CARDIAC: RRR, no murmurs, rubs, gallops  RESPIRATORY:  Clear to auscultation without rales, wheezing or rhonchi  ABDOMEN: Soft, non-tender, non-distended MUSCULOSKELETAL:  No edema; No deformity  SKIN: Warm and dry NEUROLOGIC:  Alert and oriented x 3 PSYCHIATRIC:  Normal affect   ASSESSMENT:    1. Essential hypertension   2. Nonspecific abnormal electrocardiogram (ECG) (EKG)    PLAN:    In order of problems listed above:  1. Patient with history of hypertension.  Blood pressure not controlled.  Start HCTZ 25 mg daily.  Continue Cardizem 120 daily.  Heart rate 59. 2. Previous abnormal ECG on 08/14/2019 reviewed by myself.  Frequent PACs noted.  No evidence for atrial fibrillation or flutter.  Echocardiogram today shows sinus bradycardia, occasional PACs, PVCs.  No significant arrhythmias to prevent patient from driving.  Okay for patient to drive from a cardiac perspective.  Will write a letter stating it is okay for patient to drive.  Follow-up in 1 month.  Total encounter time 60 minutes  Greater than 50% was spent in counseling and coordination of care with the patient Time also spent evaluating paperwork.    Medication Adjustments/Labs and Tests Ordered: Current medicines are reviewed at length with the patient today.  Concerns regarding medicines are outlined above.  Orders Placed This Encounter  Procedures  . EKG 12-Lead   Meds ordered this encounter  Medications  .  hydrochlorothiazide (HYDRODIURIL) 25 MG tablet    Sig: Take 1 tablet (25 mg total) by mouth daily.    Dispense:  30 tablet    Refill:  5    Patient Instructions  Medication Instructions:   Your physician has recommended you make the following change in your medication:   START Hydrochlorothiazide:  Take 1 tablet (25 mg total) by mouth daily.  *If you need a refill on your cardiac medications before your next appointment, please call your pharmacy*   Lab Work: None Ordered If you have labs (blood work) drawn today and your tests are completely normal, you will receive your results only by: Marland Kitchen MyChart Message (if you have MyChart) OR . A paper copy in the mail If you have any lab test that is abnormal or we need to change your treatment, we will call you to review the results.   Testing/Procedures: None Ordered   Follow-Up: At [facility], you and your health needs are our priority.  As part of our continuing mission to provide you with exceptional heart care, we have created designated Provider Care Teams.  These Care Teams include your primary Cardiologist (physician) and Advanced Practice Providers (APPs -  Physician Assistants and Nurse Practitioners) who all work together to provide you with the care you need, when you need it.  We recommend signing up for the patient portal called "MyChart".  Sign up information is provided on this After Visit Summary.  MyChart is used to connect with patients for Virtual Visits (Telemedicine).  Patients are able to view lab/test results, encounter notes, upcoming appointments, etc.  Non-urgent messages can be sent to your provider as well.   To learn more about what you can do with MyChart, go to [email or web address].    Your next appointment:   1 month(s)  The format for your next appointment:   In Person  Provider:   Kate Sable, MD   Other Instructions       Signed, Kate Sable, MD  01/30/2020 10:29 AM      [employer]

## 2020-01-30 NOTE — Patient Instructions (Signed)
Medication Instructions:   Your physician has recommended you make the following change in your medication:   START Hydrochlorothiazide:  Take 1 tablet (25 mg total) by mouth daily.  *If you need a refill on your cardiac medications before your next appointment, please call your pharmacy*   Lab Work: None Ordered If you have labs (blood work) drawn today and your tests are completely normal, you will receive your results only by:  [place] (if you have MyChart) OR  A paper copy in the mail If you have any lab test that is abnormal or we need to change your treatment, we will call you to review the results.   Testing/Procedures: None Ordered   Follow-Up: At [facility], you and your health needs are our priority.  As part of our continuing mission to provide you with exceptional heart care, we have created designated Provider Care Teams.  These Care Teams include your primary Cardiologist (physician) and Advanced Practice Providers (APPs -  Physician Assistants and Nurse Practitioners) who all work together to provide you with the care you need, when you need it.  We recommend signing up for the patient portal called "MyChart".  Sign up information is provided on this After Visit Summary.  MyChart is used to connect with patients for Virtual Visits (Telemedicine).  Patients are able to view lab/test results, encounter notes, upcoming appointments, etc.  Non-urgent messages can be sent to your provider as well.   To learn more about what you can do with MyChart, go to [email or web address].    Your next appointment:   1 month(s)  The format for your next appointment:   In Person  Provider:   Kate Sable, MD   Other Instructions

## 2020-01-30 NOTE — Addendum Note (Signed)
Addended by: Kavin Leech on: 01/30/2020 11:39 AM   Modules accepted: Orders

## 2020-01-30 NOTE — Telephone Encounter (Signed)
Patient calling back to check status of sleep study. He states he has not heard anything regarding.

## 2020-01-31 ENCOUNTER — Telehealth: Payer: Self-pay | Admitting: Family Medicine

## 2020-01-31 NOTE — Telephone Encounter (Signed)
There needs to be recent office visit with documentation of why sleep study needs to be ordered in order for insurance to cover the study and cpap.Let me know if I have missed something,Thanks

## 2020-01-31 NOTE — Telephone Encounter (Signed)
Patient scheduled for as OV on 02/06/2020

## 2020-02-05 NOTE — Progress Notes (Signed)
I,Roshena L Chambers,acting as a scribe for Lelon Huh, MD.,have documented all relevant documentation on the behalf of Lelon Huh, MD,as directed by  Lelon Huh, MD while in the presence of Lelon Huh, MD.  Established patient visit   Patient: Jeffrey Torres   DOB: [birth year]   74 y.o. Male  MRN: [id number] Visit Date: 02/06/2020  Today's healthcare provider: Lelon Huh, MD   Chief Complaint  Patient presents with   Snoring   Subjective    HPI  Snoring: Patient presents today to discuss having a sleep study done. 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 He does admit to feeling sleepy if he sits to rest for more than a few minutes. 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 He is tolerating addition of hctz well with no adverse side effects.      Medications: Outpatient Medications Prior to Visit  Medication Sig   aspirin 81 MG tablet Take 81 mg by mouth daily.   diltiazem (CARDIZEM CD) 120 MG 24 hr capsule TAKE 1 CAPSULE BY MOUTH ONCE DAILY   fluticasone (FLONASE) 50 MCG/ACT nasal spray Place 2 sprays into both nostrils every morning.    gatifloxacin (ZYMAXID) 0.5 % SOLN Place 1 drop into the right eye 2 (two) times daily.    hydrochlorothiazide (HYDRODIURIL) 25 MG tablet Take 1 tablet (25 mg total) by mouth daily.   Multiple Vitamins-Minerals (PRESERVISION AREDS 2 PO) Take 2 capsules by mouth every morning.    omeprazole (PRILOSEC) 20 MG capsule Take 20 mg by mouth daily before breakfast.   [DISCONTINUED] diphenhydramine-acetaminophen (TYLENOL PM) 25-500 MG TABS tablet Take 1 tablet by mouth at bedtime as needed (sleep). (Patient not taking: Reported on 02/06/2020)   [DISCONTINUED] ibuprofen (ADVIL) 200 MG  tablet Take 400 mg by mouth every 6 (six) hours as needed for headache or moderate pain. (Patient not taking: Reported on 02/06/2020)   No facility-administered medications prior to visit.    Review of Systems  Constitutional: Negative for appetite change, chills and fever.       Snoring  Respiratory: Negative for chest tightness, shortness of breath and wheezing.   Cardiovascular: Negative for chest pain and palpitations.  Gastrointestinal: Negative for abdominal pain, nausea and vomiting.      Objective    BP 128/75 (BP Location: Left Arm, Patient Position: Sitting, Cuff Size: Large)    Pulse 64    Temp 98 F (36.7 C) (Oral)    Resp 18    Wt 228 lb (103.4 kg)    BMI 32.71 kg/m    Waist circumference: 45.5 inches Neck Circumference: 19 inches  Physical Exam   General: Appearance:    Obese male in no acute distress  Eyes:    PERRL, conjunctiva/corneas clear, EOM's intact       Lungs:     Clear to auscultation bilaterally, respirations unlabored  Heart:    Normal heart rate. Normal rhythm. No murmurs, rubs, or gallops.   MS:   All extremities are intact.   Neurologic:   Awake, alert, oriented x 3. No apparent focal neurological           defect.         No results found for  any visits on 02/06/20.  Assessment & Plan     1. Hypersomnia  - Home sleep test  2. Essential hypertension Much better since Dr. Ray Church add hctz last week. No restrictions for CDL medical.   3. Personal history of colonic polyps He is overdue for follow up colonoscopy which was last done at [facility] un 2014. He prefers to have this done locally.  - Ambulatory referral to gastroenterology for colonoscopy   No follow-ups on file.      The entirety of the information documented in the History of Present Illness, Review of Systems and Physical Exam were personally obtained by me. Portions of this information were initially documented by the CMA and reviewed by me for thoroughness and accuracy.       Lelon Huh, MD  [facility] [phone] (phone) [phone] (fax)  [place]

## 2020-02-06 ENCOUNTER — Other Ambulatory Visit: Payer: Self-pay

## 2020-02-06 ENCOUNTER — Ambulatory Visit (INDEPENDENT_AMBULATORY_CARE_PROVIDER_SITE_OTHER): Payer: Medicare Other | Admitting: Family Medicine

## 2020-02-06 ENCOUNTER — Encounter: Payer: Self-pay | Admitting: Family Medicine

## 2020-02-06 VITALS — BP 128/75 | HR 64 | Temp 98.0°F | Resp 18 | Wt 228.0 lb

## 2020-02-06 DIAGNOSIS — I10 Essential (primary) hypertension: Secondary | ICD-10-CM

## 2020-02-06 DIAGNOSIS — Z86010 Personal history of colonic polyps: Secondary | ICD-10-CM | POA: Diagnosis not present

## 2020-02-06 DIAGNOSIS — G4710 Hypersomnia, unspecified: Secondary | ICD-10-CM

## 2020-02-06 NOTE — Patient Instructions (Signed)
.   Covid-19 vaccines: The Covid vaccines have been given to hundreds of millions of people and found to be very effective and are as safe as any other vaccine.  The [employer] vaccine has been associated with very rare dangerous blood clots, but only in adult women under the age of 42.  The risk of dying from Covid infections is much higher than having a serious reaction to the vaccine.  I strongly recommend getting fully vaccinated against Covid-19.  I recommend that adult women under 60 get fully vaccinated, but the [place] vaccines may be safer for those women than the [employer] vaccine.

## 2020-02-07 NOTE — Telephone Encounter (Signed)
Order for home sleep study faxed to [facility]

## 2020-02-12 DIAGNOSIS — G4733 Obstructive sleep apnea (adult) (pediatric): Secondary | ICD-10-CM | POA: Diagnosis not present

## 2020-02-12 DIAGNOSIS — R0602 Shortness of breath: Secondary | ICD-10-CM | POA: Diagnosis not present

## 2020-02-12 LAB — PULMONARY FUNCTION TEST

## 2020-02-13 DIAGNOSIS — G4733 Obstructive sleep apnea (adult) (pediatric): Secondary | ICD-10-CM | POA: Diagnosis not present

## 2020-02-13 DIAGNOSIS — R0602 Shortness of breath: Secondary | ICD-10-CM | POA: Diagnosis not present

## 2020-02-14 ENCOUNTER — Telehealth (INDEPENDENT_AMBULATORY_CARE_PROVIDER_SITE_OTHER): Payer: Self-pay | Admitting: Gastroenterology

## 2020-02-14 ENCOUNTER — Other Ambulatory Visit: Payer: Self-pay

## 2020-02-14 DIAGNOSIS — Z86010 Personal history of colonic polyps: Secondary | ICD-10-CM

## 2020-02-14 MED ORDER — NA SULFATE-K SULFATE-MG SULF 17.5-3.13-1.6 GM/177ML PO SOLN: 1.0000 | Freq: Once | ORAL | 0 refills | Status: AC

## 2020-02-14 NOTE — Progress Notes (Signed)
Gastroenterology Pre-Procedure Review  Request Date: Tuesday 03/12/20 Requesting Physician: Dr. Allen Norris  PATIENT REVIEW QUESTIONS: The patient responded to the following health history questions as indicated:    1. Are you having any GI issues? no 2. Do you have a personal history of Polyps? yes (3 years ago at [facility]) 3. Do you have a family history of Colon Cancer or Polyps? no 4. Diabetes Mellitus? no 5. Joint replacements in the past 12 months?no 6. Major health problems in the past 3 months?no 7. Any artificial heart valves, MVP, or defibrillator?no    MEDICATIONS & ALLERGIES:    Patient reports the following regarding taking any anticoagulation/antiplatelet therapy:   Plavix, Coumadin, Eliquis, Xarelto, Lovenox, Pradaxa, Brilinta, or Effient? no Aspirin? yes (81 mg aspirin)  Patient confirms/reports the following medications:  Current Outpatient Medications  Medication Sig Dispense Refill  . aspirin 81 MG tablet Take 81 mg by mouth daily.    Marland Kitchen diltiazem (CARDIZEM CD) 120 MG 24 hr capsule TAKE 1 CAPSULE BY MOUTH ONCE DAILY 30 capsule 1  . fluticasone (FLONASE) 50 MCG/ACT nasal spray Place 2 sprays into both nostrils every morning.     Marland Kitchen gatifloxacin (ZYMAXID) 0.5 % SOLN Place 1 drop into the right eye 2 (two) times daily.     . hydrochlorothiazide (HYDRODIURIL) 25 MG tablet Take 1 tablet (25 mg total) by mouth daily. 30 tablet 5  . Multiple Vitamins-Minerals (PRESERVISION AREDS 2 PO) Take 2 capsules by mouth every morning.     Marland Kitchen omeprazole (PRILOSEC) 20 MG capsule Take 20 mg by mouth daily before breakfast.     No current facility-administered medications for this visit.    Patient confirms/reports the following allergies:  Allergies  Allergen Reactions  . Penicillins Anaphylaxis and Swelling    Did it involve swelling of the face/tongue/throat, SOB, or low BP? Yes Did it involve sudden or severe rash/hives, skin peeling, or any reaction on the inside of your mouth or nose?  No Did you need to seek medical attention at a hospital or doctor's office? No When did it last happen?10 + years ago If all above answers are "NO", may proceed with cephalosporin use.     No orders of the defined types were placed in this encounter.   AUTHORIZATION INFORMATION Primary Insurance: 1D#: Group #:  Secondary Insurance: 1D#: Group #:  SCHEDULE INFORMATION: Date: Tuesday 03/12/20 Time: Location:[facility]

## 2020-02-16 ENCOUNTER — Telehealth: Payer: Self-pay

## 2020-02-16 DIAGNOSIS — G4733 Obstructive sleep apnea (adult) (pediatric): Secondary | ICD-10-CM

## 2020-02-16 NOTE — Telephone Encounter (Signed)
Copied from [employer] [id number]. Topic: General - Other >> Feb 16, 2020  2:02 PM Hinda Lenis D wrote:  PT has questions about his sleep study results / please advise

## 2020-02-19 NOTE — Telephone Encounter (Signed)
Left message for patient to call back. KW

## 2020-02-19 NOTE — Telephone Encounter (Signed)
Patient called back wanting to inquire about sleep study report but I do not see it scanned in chart, by chance have you seen report and reviewed over it yet? KW

## 2020-02-20 ENCOUNTER — Telehealth: Payer: Self-pay

## 2020-02-20 NOTE — Telephone Encounter (Signed)
I think its [facility] at [phone]. You might have to check with sarah otherwise.

## 2020-02-20 NOTE — Telephone Encounter (Signed)
Copied from [place] [id number]. Topic: General - Call Back - No Documentation >> Feb 20, 2020 10:36 AM Jeffrey Torres D wrote: Reason for CRM: Patient is calling to check status of sleep study results. Please call patient back.

## 2020-02-20 NOTE — Telephone Encounter (Signed)
Unable to see sleep study results in patient's chart. Where can I request his results from?

## 2020-02-21 ENCOUNTER — Telehealth: Payer: Self-pay

## 2020-02-21 DIAGNOSIS — G4733 Obstructive sleep apnea (adult) (pediatric): Secondary | ICD-10-CM | POA: Insufficient documentation

## 2020-02-21 NOTE — Telephone Encounter (Signed)
Left message for patient to call office back, [facility] triage can relay message below to patient. KW

## 2020-02-21 NOTE — Telephone Encounter (Signed)
Spoke with Joneen Caraway from [employer] [facility] and he will fax over results.

## 2020-02-21 NOTE — Telephone Encounter (Signed)
Copied from [place] [phone]. Topic: General - Other >> Feb 21, 2020  3:23 PM Hinda Lenis D wrote: PT returning the call / sleep study questions / please advise

## 2020-02-21 NOTE — Telephone Encounter (Signed)
Patient called and given results as noted by Dr. Caryn Section on 02/21/20, patient verbalized understanding. He says he is not wanting to use the CPAP due to all the negative things he's heard about it. He says he will take his chances and continue sleeping like he has been.

## 2020-02-21 NOTE — Telephone Encounter (Signed)
Left message on number below to get sleep study results from patient. Will try again later.

## 2020-02-21 NOTE — Telephone Encounter (Signed)
Sleep study shows severe sleep apnea, have sent order for CPAP. He will need to schedule follow up 1 month after starting CPAP.

## 2020-02-21 NOTE — Telephone Encounter (Signed)
[facility] sent a message stating patient returned call. I tried calling patient again but didn't get an answer. Left message to call back. OK for [facility] triage to advise of message below.

## 2020-02-22 ENCOUNTER — Ambulatory Visit: Payer: Medicare Other | Admitting: Cardiology

## 2020-02-22 NOTE — Telephone Encounter (Signed)
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

## 2020-02-22 NOTE — Telephone Encounter (Signed)
Patient advised as below. He replied, "I understand. This is a bunch of BS that the government is trying to control". Patient then hung up the phone.

## 2020-03-01 ENCOUNTER — Telehealth: Payer: Self-pay | Admitting: Cardiology

## 2020-03-01 ENCOUNTER — Other Ambulatory Visit: Payer: Self-pay

## 2020-03-01 NOTE — Telephone Encounter (Signed)
*  STAT* If patient is at the pharmacy, call can be transferred to refill team.   1. Which medications need to be refilled? (please list name of each medication and dose if known)   HCTZ 25 mg po q d   2. Which pharmacy/location (including street and city if local pharmacy) is medication to be sent to?  [facility]   3. Do they need a 30 day or 90 day supply? [place]

## 2020-03-05 ENCOUNTER — Other Ambulatory Visit: Payer: Self-pay

## 2020-03-05 ENCOUNTER — Ambulatory Visit (INDEPENDENT_AMBULATORY_CARE_PROVIDER_SITE_OTHER): Payer: Medicare Other | Admitting: Cardiology

## 2020-03-05 ENCOUNTER — Encounter: Payer: Self-pay | Admitting: Cardiology

## 2020-03-05 VITALS — BP 136/70 | HR 61 | Ht 70.0 in | Wt 232.1 lb

## 2020-03-05 DIAGNOSIS — I10 Essential (primary) hypertension: Secondary | ICD-10-CM

## 2020-03-05 DIAGNOSIS — G4733 Obstructive sleep apnea (adult) (pediatric): Secondary | ICD-10-CM

## 2020-03-05 DIAGNOSIS — I493 Ventricular premature depolarization: Secondary | ICD-10-CM | POA: Diagnosis not present

## 2020-03-05 NOTE — Progress Notes (Signed)
Cardiology Office Note:    Date:  03/05/2020   ID:  Jeffrey Torres, DOB [birth year], MRN [id number]  PCP:  Birdie Sons, MD  [facility] Cardiologist:  Kate Sable, MD  [place] Electrophysiologist:  None   Referring MD: Birdie Sons, MD   Chief Complaint  Patient presents with  . OTHER    1 month f/u no complaints today. Meds reviewed verbally with pt.    History of Present Illness:    Jeffrey Torres is a 74 y.o. male with a hx of GERD, hypertension, former smoker who presents for follow-up.  Patient last seen due to abnormal ECG and hypertension.  No significant arrhythmias noted on EKG review to prevent patient from driving.  Blood pressure was not well controlled, HCTZ started.  Cardizem was continued.  Patient presents today for follow-up.  Patient states his blood pressure has improved since starting HCTZ.  Has no medication side effects, seems to be tolerating medicine okay.  He was recently diagnosed with OSA, CPAP mask recommended but patient does not like to wear a mask.  He will try to lose weight to see if this will help with his sleep apnea.  He denies daytime somnolence, fatigue.  Denies snoring.  He was tested and diagnosed with sleep apnea after being to he had an enlarged neck by [employer].    Past Medical History:  Diagnosis Date  . Arthritis   . Cancer [facility])    larynx ca, surgery and radiation  . GERD (gastroesophageal reflux disease)   . Heart murmur   . Hernia, inguinal, bilateral   . History of chicken pox   . History of kidney stones   . History of measles   . History of mumps   . History of shingles   . HOH (hard of hearing)   . Macular degeneration of both eyes   . Motion sickness    ocean boat  . PVC (premature ventricular contraction)     Past Surgical History:  Procedure Laterality Date  . CARDIAC CATHETERIZATION  2010   normal per patient report  . CATARACT EXTRACTION W/PHACO Right 07/18/2019   Procedure: CATARACT EXTRACTION  PHACO AND INTRAOCULAR LENS PLACEMENT (IOC) RIGHT 4.54 00:29.7;  Surgeon: Birder Robson, MD;  Location: [place];  Service: Ophthalmology;  Laterality: Right;  . CATARACT EXTRACTION W/PHACO Left 08/08/2019   Procedure: CATARACT EXTRACTION PHACO AND INTRAOCULAR LENS PLACEMENT ([place]) LEFT;  Surgeon: Birder Robson, MD;  Location: [facility];  Service: Ophthalmology;  Laterality: Left;  Lot #[id number] H [place]: 00:34.5 CDE: 4.75  . COLONOSCOPY  06/24/2006   Dr. Bary Castilla. Multiple benign appearing 38mm polyps in the cecum and in the rectum. -Three 98mm polyps in the transversed colon, in the tranverse colon, proximal and in the distal transverse colon. Resected and retrieved.  . CYSTOSCOPY WITH HOLMIUM LASER LITHOTRIPSY  2001   kidney stones removed, [facility]  . LARYNGOSCOPY Right 04/01/2017   Procedure: SUSPENSION LARYNGOSCOPY WITH MICROFLAP EXCISION;  Surgeon: Carloyn Manner, MD;  Location: [facility];  Service: ENT;  Laterality: Right;  . sinus surgery   2002   [place]; removal of polyps  . TONSILLECTOMY  1954  . Tubular adenoma removed  06/24/2006    Current Medications: Current Meds  Medication Sig  . aspirin 81 MG tablet Take 81 mg by mouth daily.  Marland Kitchen diltiazem (CARDIZEM CD) 120 MG 24 hr capsule Take 120 mg by mouth daily.  . fluticasone (FLONASE) 50 MCG/ACT nasal spray Place 2 sprays  into both nostrils every morning.   Marland Kitchen gatifloxacin (ZYMAXID) 0.5 % SOLN Place 1 drop into the right eye 2 (two) times daily.   . hydrochlorothiazide (HYDRODIURIL) 25 MG tablet Take 1 tablet (25 mg total) by mouth daily.  . Multiple Vitamins-Minerals (PRESERVISION AREDS 2 PO) Take 2 capsules by mouth every morning.   Marland Kitchen omeprazole (PRILOSEC) 20 MG capsule Take 20 mg by mouth daily before breakfast.     Allergies:   Penicillins   Social History   Socioeconomic History  . Marital status: Married    Spouse name: Not on file  . Number of children: 2  . Years of education: Not on file  . Highest  education level: 12th grade  Occupational History  . Occupation: Truck Geophysicist/field seismologist  Tobacco Use  . Smoking status: Former Smoker    Packs/day: 1.00    Types: Cigarettes    Quit date: 12/2017    Years since quitting: 2.1  . Smokeless tobacco: Never Used  . Tobacco comment: started age 11 yo.previously smoked carton a week  Vaping Use  . Vaping Use: Never used  Substance and Sexual Activity  . Alcohol use: Not Currently    Alcohol/week: 0.0 standard drinks  . Drug use: No  . Sexual activity: Not on file  Other Topics Concern  . 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 Difficulty of Paying Living Expenses: Not hard at all  Food Insecurity: No Food Insecurity  . Worried About Charity fundraiser in the Last Year: Never true  . Ran Out of Food in the Last Year: Never true  Transportation Needs: No Transportation Needs  . Lack of Transportation (Medical): No  . Lack of Transportation (Non-Medical): No  Physical Activity: Inactive  . Days of Exercise per Week: 0 days  . Minutes of Exercise per Session: 0 min  Stress: No Stress Concern Present  . Feeling of Stress : Not at all  Social Connections: Moderately Isolated  . Frequency of Communication with Friends and Family: Twice a week  . Frequency of Social Gatherings with Friends and Family: Once a week  . Attends Religious Services: Never  . Active Member of Clubs or Organizations: No  . Attends Archivist Meetings: Never  . Marital Status: Married     Family History: The patient's family history includes Heart disease in his father; Lung cancer in his mother.  ROS:   Please see the history of present illness.     All other systems reviewed and are negative.  EKGs/Labs/Other Studies Reviewed:    The following studies were reviewed today:   EKG:  EKG is  ordered today.  The ekg ordered today  demonstrates normal sinus rhythm, normal ECG.  Recent Labs: 08/14/2019: ALT 27; BUN 18; Creatinine, Ser 1.24; Magnesium 2.1; Potassium 4.2; Sodium 140; TSH 4.030  Recent Lipid Panel    Component Value Date/Time   CHOL 147 08/14/2019 1006   TRIG 249 (H) 08/14/2019 1006   HDL 43 08/14/2019 1006   CHOLHDL 3.4 08/14/2019 1006   LDLCALC 64 08/14/2019 1006    Physical Exam:    VS:  BP 136/70 (BP Location: Left Arm, Patient Position: Sitting, Cuff Size: Normal)   Pulse 61   Ht 5\' 10"  (1.778 m)   Wt 232 lb 2 oz (105.3 kg)   SpO2 98%   BMI 33.31 kg/m  Wt Readings from Last 3 Encounters:  03/05/20 232 lb 2 oz (105.3 kg)  02/06/20 228 lb (103.4 kg)  01/30/20 230 lb 6 oz (104.5 kg)     GEN:  Well nourished, well developed in no acute distress HEENT: Normal NECK: No JVD; No carotid bruits LYMPHATICS: No lymphadenopathy CARDIAC: RRR, no murmurs, rubs, gallops RESPIRATORY:  Clear to auscultation without rales, wheezing or rhonchi  ABDOMEN: Soft, non-tender, non-distended MUSCULOSKELETAL:  No edema; No deformity  SKIN: Warm and dry NEUROLOGIC:  Alert and oriented x 3 PSYCHIATRIC:  Normal affect   ASSESSMENT:    1. Essential hypertension   2. OSA (obstructive sleep apnea)   3. Premature ventricular contractions    PLAN:    In order of problems listed above:  1. Patient with history of hypertension.  Blood pressure controlled. Continue HCTZ 25 mg daily, Cardizem 120 daily.  2. Patient recently diagnosed OSA.  CPAP mask recommended, patient will like to lose weight and not wear a mask.  He was encouraged to follow recommendations by primary care provider.  Follow-up in 1 year  Total encounter time 35 minutes  Greater than 50% was spent in counseling and coordination of care with the patient    Medication Adjustments/Labs and Tests Ordered: Current medicines are reviewed at length with the patient today.  Concerns regarding medicines are outlined above.  Orders Placed  This Encounter  Procedures  . EKG 12-Lead   No orders of the defined types were placed in this encounter.   Patient Instructions  Medication Instructions:  None *If you need a refill on your cardiac medications before your next appointment, please call your pharmacy*   Lab Work: None If you have labs (blood work) drawn today and your tests are completely normal, you will receive your results only by: Marland Kitchen MyChart Message (if you have MyChart) OR . A paper copy in the mail If you have any lab test that is abnormal or we need to change your treatment, we will call you to review the results.   Testing/Procedures: None   Follow-Up: At [facility], you and your health needs are our priority.  As part of our continuing mission to provide you with exceptional heart care, we have created designated Provider Care Teams.  These Care Teams include your primary Cardiologist (physician) and Advanced Practice Providers (APPs -  Physician Assistants and Nurse Practitioners) who all work together to provide you with the care you need, when you need it.  We recommend signing up for the patient portal called "MyChart".  Sign up information is provided on this After Visit Summary.  MyChart is used to connect with patients for Virtual Visits (Telemedicine).  Patients are able to view lab/test results, encounter notes, upcoming appointments, etc.  Non-urgent messages can be sent to your provider as well.   To learn more about what you can do with MyChart, go to [email or web address].    Your next appointment:   1 year(s)  The format for your next appointment:   In Person  Provider:    You may see Kate Sable, MD or one of the following Advanced Practice Providers on your designated Care Team:    Murray Hodgkins, NP  Christell Faith, PA-C  Marrianne Mood, PA-C    Other Instructions Follow up in 1 yr.     Signed, Kate Sable, MD  03/05/2020 9:30 AM    [place]

## 2020-03-05 NOTE — Patient Instructions (Signed)
Medication Instructions:  None *If you need a refill on your cardiac medications before your next appointment, please call your pharmacy*   Lab Work: None If you have labs (blood work) drawn today and your tests are completely normal, you will receive your results only by: Marland Kitchen MyChart Message (if you have MyChart) OR . A paper copy in the mail If you have any lab test that is abnormal or we need to change your treatment, we will call you to review the results.   Testing/Procedures: None   Follow-Up: At [facility], you and your health needs are our priority.  As part of our continuing mission to provide you with exceptional heart care, we have created designated Provider Care Teams.  These Care Teams include your primary Cardiologist (physician) and Advanced Practice Providers (APPs -  Physician Assistants and Nurse Practitioners) who all work together to provide you with the care you need, when you need it.  We recommend signing up for the patient portal called "MyChart".  Sign up information is provided on this After Visit Summary.  MyChart is used to connect with patients for Virtual Visits (Telemedicine).  Patients are able to view lab/test results, encounter notes, upcoming appointments, etc.  Non-urgent messages can be sent to your provider as well.   To learn more about what you can do with MyChart, go to [email or web address].    Your next appointment:   1 year(s)  The format for your next appointment:   In Person  Provider:    You may see Kate Sable, MD or one of the following Advanced Practice Providers on your designated Care Team:    Murray Hodgkins, NP  Christell Faith, PA-C  Marrianne Mood, PA-C    Other Instructions Follow up in 1 yr.

## 2020-03-08 ENCOUNTER — Other Ambulatory Visit: Payer: Self-pay

## 2020-03-08 ENCOUNTER — Other Ambulatory Visit
Admission: RE | Admit: 2020-03-08 | Discharge: 2020-03-08 | Disposition: A | Discharge status: Home / Self Care | Payer: Medicare Other | Source: Ambulatory Visit | Attending: Gastroenterology | Admitting: Gastroenterology

## 2020-03-08 DIAGNOSIS — Z20822 Contact with and (suspected) exposure to covid-19: Secondary | ICD-10-CM | POA: Diagnosis not present

## 2020-03-08 DIAGNOSIS — Z01812 Encounter for preprocedural laboratory examination: Secondary | ICD-10-CM | POA: Insufficient documentation

## 2020-03-09 LAB — SARS CORONAVIRUS 2 (TAT 6-24 HRS): SARS Coronavirus 2: NEGATIVE

## 2020-03-11 ENCOUNTER — Encounter: Payer: Self-pay | Admitting: Gastroenterology

## 2020-03-11 DIAGNOSIS — H2640 Unspecified secondary cataract: Secondary | ICD-10-CM | POA: Diagnosis not present

## 2020-03-12 ENCOUNTER — Other Ambulatory Visit: Payer: Self-pay

## 2020-03-12 ENCOUNTER — Ambulatory Visit
Admission: RE | Admit: 2020-03-12 | Discharge: 2020-03-12 | Disposition: A | Discharge status: Home / Self Care | Payer: Medicare Other | Attending: Gastroenterology | Admitting: Gastroenterology

## 2020-03-12 ENCOUNTER — Ambulatory Visit: Payer: Medicare Other | Admitting: Anesthesiology

## 2020-03-12 ENCOUNTER — Encounter: Payer: Self-pay | Admitting: Gastroenterology

## 2020-03-12 ENCOUNTER — Encounter
Admission: RE | Disposition: A | Discharge status: Home / Self Care | Payer: Self-pay | Source: Home / Self Care | Attending: Gastroenterology

## 2020-03-12 DIAGNOSIS — Z8719 Personal history of other diseases of the digestive system: Secondary | ICD-10-CM | POA: Diagnosis not present

## 2020-03-12 DIAGNOSIS — K219 Gastro-esophageal reflux disease without esophagitis: Secondary | ICD-10-CM | POA: Diagnosis not present

## 2020-03-12 DIAGNOSIS — K635 Polyp of colon: Secondary | ICD-10-CM | POA: Diagnosis not present

## 2020-03-12 DIAGNOSIS — Z86010 Personal history of colonic polyps: Secondary | ICD-10-CM | POA: Insufficient documentation

## 2020-03-12 DIAGNOSIS — Z801 Family history of malignant neoplasm of trachea, bronchus and lung: Secondary | ICD-10-CM | POA: Diagnosis not present

## 2020-03-12 DIAGNOSIS — K648 Other hemorrhoids: Secondary | ICD-10-CM | POA: Insufficient documentation

## 2020-03-12 DIAGNOSIS — D124 Benign neoplasm of descending colon: Secondary | ICD-10-CM | POA: Diagnosis not present

## 2020-03-12 DIAGNOSIS — Z79899 Other long term (current) drug therapy: Secondary | ICD-10-CM | POA: Diagnosis not present

## 2020-03-12 DIAGNOSIS — Z7982 Long term (current) use of aspirin: Secondary | ICD-10-CM | POA: Insufficient documentation

## 2020-03-12 DIAGNOSIS — Z8249 Family history of ischemic heart disease and other diseases of the circulatory system: Secondary | ICD-10-CM | POA: Diagnosis not present

## 2020-03-12 DIAGNOSIS — Z8521 Personal history of malignant neoplasm of larynx: Secondary | ICD-10-CM | POA: Insufficient documentation

## 2020-03-12 DIAGNOSIS — Z1211 Encounter for screening for malignant neoplasm of colon: Secondary | ICD-10-CM | POA: Insufficient documentation

## 2020-03-12 DIAGNOSIS — Z87891 Personal history of nicotine dependence: Secondary | ICD-10-CM | POA: Diagnosis not present

## 2020-03-12 DIAGNOSIS — M1990 Unspecified osteoarthritis, unspecified site: Secondary | ICD-10-CM | POA: Insufficient documentation

## 2020-03-12 DIAGNOSIS — Z860101 Personal history of adenomatous and serrated colon polyps: Secondary | ICD-10-CM

## 2020-03-12 DIAGNOSIS — D123 Benign neoplasm of transverse colon: Secondary | ICD-10-CM | POA: Diagnosis not present

## 2020-03-12 DIAGNOSIS — D125 Benign neoplasm of sigmoid colon: Secondary | ICD-10-CM | POA: Insufficient documentation

## 2020-03-12 DIAGNOSIS — H9190 Unspecified hearing loss, unspecified ear: Secondary | ICD-10-CM | POA: Diagnosis not present

## 2020-03-12 DIAGNOSIS — Z880 Allergy status to penicillin: Secondary | ICD-10-CM | POA: Insufficient documentation

## 2020-03-12 HISTORY — PX: COLONOSCOPY WITH PROPOFOL: SHX5780

## 2020-03-12 SURGERY — COLONOSCOPY WITH PROPOFOL
Anesthesia: General

## 2020-03-12 MED ORDER — PROPOFOL 10 MG/ML IV BOLUS
INTRAVENOUS | Status: DC | PRN
  Administered 2020-03-12: 100 mg via INTRAVENOUS

## 2020-03-12 MED ORDER — PROPOFOL 500 MG/50ML IV EMUL
INTRAVENOUS | Status: DC | PRN
  Administered 2020-03-12: 120 ug/kg/min via INTRAVENOUS

## 2020-03-12 MED ORDER — GLYCOPYRROLATE 0.2 MG/ML IJ SOLN
INTRAMUSCULAR | Status: DC | PRN
  Administered 2020-03-12: .2 mg via INTRAVENOUS

## 2020-03-12 MED ORDER — LIDOCAINE HCL (CARDIAC) PF 100 MG/5ML IV SOSY
PREFILLED_SYRINGE | INTRAVENOUS | Status: DC | PRN
  Administered 2020-03-12: 50 mg via INTRAVENOUS

## 2020-03-12 MED ORDER — LIDOCAINE HCL (PF) 2 % IJ SOLN
INTRAMUSCULAR | Status: AC
  Filled 2020-03-12: qty 5

## 2020-03-12 MED ORDER — SODIUM CHLORIDE 0.9 % IV SOLN: INTRAVENOUS | Status: DC

## 2020-03-12 NOTE — Anesthesia Postprocedure Evaluation (Signed)
Anesthesia Post Note  Patient: Jeffrey Torres  Procedure(s) Performed: COLONOSCOPY WITH PROPOFOL (N/A )  Patient location during evaluation: PACU Anesthesia Type: General Level of consciousness: awake and alert Pain management: pain level controlled Vital Signs Assessment: post-procedure vital signs reviewed and stable Respiratory status: spontaneous breathing, nonlabored ventilation and respiratory function stable Cardiovascular status: blood pressure returned to baseline and stable Postop Assessment: no apparent nausea or vomiting Anesthetic complications: no   No complications documented.   Last Vitals:  Vitals:   03/12/20 0915 03/12/20 0925  BP: (!) 92/58 114/71  Pulse: 78 70  Resp: 20 17  Temp:    SpO2: 92% 92%    Last Pain:  Vitals:   03/12/20 0925  TempSrc:   PainSc: 0-No pain                 Tera Mater

## 2020-03-12 NOTE — Transfer of Care (Signed)
Immediate Anesthesia Transfer of Care Note  Patient: Jeffrey Torres  Procedure(s) Performed: COLONOSCOPY WITH PROPOFOL (N/A )  Patient Location: Endoscopy Unit  Anesthesia Type:General  Level of Consciousness: drowsy and patient cooperative  Airway & Oxygen Therapy: Patient Spontanous Breathing  Post-op Assessment: Report given to RN and Post -op Vital signs reviewed and stable  Post vital signs: Reviewed and stable  Last Vitals:  Vitals Value Taken Time  BP 116/60 03/12/20 0905  Temp    Pulse 70 03/12/20 0905  Resp 27 03/12/20 0905  SpO2 89 % 03/12/20 0905  Vitals shown include unvalidated device data.  Last Pain:  Vitals:   03/12/20 0905  TempSrc:   PainSc: Asleep         Complications: No complications documented.

## 2020-03-12 NOTE — Progress Notes (Signed)
°   03/12/20 0750  Clinical Encounter Type  Visited With Patient  Visit Type Initial  Referral From Chaplain  Consult/Referral To Chaplain  While rounding SDS waiting area, chaplain briefly visited with patient. He said he was surviving colonoscopy prep. Pt said he wants to eat a cow. Chaplain and patient laughed and he said I know that can't happen." Chaplain wished pt well and left.

## 2020-03-12 NOTE — Anesthesia Preprocedure Evaluation (Signed)
Anesthesia Evaluation  Patient identified by MRN, date of birth, ID band Patient awake    Reviewed: Allergy & Precautions, H&P , NPO status , Patient's Chart, lab work & pertinent test results  History of Anesthesia Complications Negative for: history of anesthetic complications  Airway Mallampati: II      Comment: TM 3 FB Dental  (+) Chipped   Pulmonary sleep apnea , neg COPD, former smoker,    breath sounds clear to auscultation       Cardiovascular hypertension, (-) angina(-) Past MI and (-) Cardiac Stents + dysrhythmias Atrial Fibrillation  Rhythm:irregular Rate:Normal     Neuro/Psych negative neurological ROS  negative psych ROS   GI/Hepatic Neg liver ROS, GERD  Controlled,  Endo/Other  negative endocrine ROS  Renal/GU negative Renal ROS  negative genitourinary   Musculoskeletal   Abdominal   Peds  Hematology negative hematology ROS (+)   Anesthesia Other Findings Past Medical History: No date: Arthritis No date: Cancer (HCC)     Comment:  larynx ca, surgery and radiation No date: GERD (gastroesophageal reflux disease) No date: Heart murmur No date: Hernia, inguinal, bilateral No date: History of chicken pox No date: History of kidney stones No date: History of measles No date: History of mumps No date: History of shingles No date: HOH (hard of hearing) No date: Macular degeneration of both eyes No date: Motion sickness     Comment:  ocean boat No date: PVC (premature ventricular contraction)  Past Surgical History: 2010: CARDIAC CATHETERIZATION     Comment:  normal per patient report 07/18/2019: CATARACT EXTRACTION W/PHACO; Right     Comment:  Procedure: CATARACT EXTRACTION PHACO AND INTRAOCULAR               LENS PLACEMENT ([place]) RIGHT 4.54 00:29.7;  Surgeon:               Birder Robson, MD;  Location: [place];                Service: Ophthalmology;  Laterality: Right; 08/08/2019:  CATARACT EXTRACTION W/PHACO; Left     Comment:  Procedure: CATARACT EXTRACTION PHACO AND INTRAOCULAR               LENS PLACEMENT ([place]) LEFT;  Surgeon: Birder Robson,               MD;  Location: [facility];  Service: Ophthalmology;                Laterality: Left;  Lot #[id number] H [place]: 00:34.5 CDE: 4.75 06/24/2006: COLONOSCOPY     Comment:  Dr. Bary Castilla. Multiple benign appearing 61mm polyps in the              cecum and in the rectum. -Three 12mm polyps in the               transversed colon, in the tranverse colon, proximal and               in the distal transverse colon. Resected and retrieved. 2001: CYSTOSCOPY WITH HOLMIUM LASER LITHOTRIPSY     Comment:  kidney stones removed, [place] 04/01/2017: LARYNGOSCOPY; Right     Comment:  Procedure: SUSPENSION LARYNGOSCOPY WITH MICROFLAP               EXCISION;  Surgeon: Carloyn Manner, MD;  Location:               [facility];  Service: ENT;  Laterality: Right; 2002: sinus surgery  Comment:  [place]; removal of polyps 1954: TONSILLECTOMY 06/24/2006: Tubular adenoma removed  BMI    Body Mass Index: 31.57 kg/m      Reproductive/Obstetrics negative OB ROS                             Anesthesia Physical Anesthesia Plan  ASA: II  Anesthesia Plan: General   Post-op Pain Management:    Induction:   PONV Risk Score and Plan: Propofol infusion and TIVA  Airway Management Planned: Nasal Cannula  Additional Equipment:   Intra-op Plan:   Post-operative Plan:   Informed Consent: I have reviewed the patients History and Physical, chart, labs and discussed the procedure including the risks, benefits and alternatives for the proposed anesthesia with the patient or authorized representative who has indicated his/her understanding and acceptance.     Dental Advisory Given  Plan Discussed with: Anesthesiologist, CRNA and Surgeon  Anesthesia Plan Comments:         Anesthesia Quick Evaluation

## 2020-03-12 NOTE — H&P (Signed)
Lucilla Lame, MD [address] [place], [place] [address] Phone:[phone] Fax : [phone]  Primary Care Physician:  Birdie Sons, MD Primary Gastroenterologist:  Dr. Allen Norris  Pre-Procedure History & Physical: HPI:  Jeffrey Torres is a 74 y.o. male is here for an colonoscopy.   Past Medical History:  Diagnosis Date  . Arthritis   . Cancer [facility])    larynx ca, surgery and radiation  . GERD (gastroesophageal reflux disease)   . Heart murmur   . Hernia, inguinal, bilateral   . History of chicken pox   . History of kidney stones   . History of measles   . History of mumps   . History of shingles   . HOH (hard of hearing)   . Macular degeneration of both eyes   . Motion sickness    ocean boat  . PVC (premature ventricular contraction)     Past Surgical History:  Procedure Laterality Date  . CARDIAC CATHETERIZATION  2010   normal per patient report  . CATARACT EXTRACTION W/PHACO Right 07/18/2019   Procedure: CATARACT EXTRACTION PHACO AND INTRAOCULAR LENS PLACEMENT ([facility]) RIGHT 4.54 00:29.7;  Surgeon: Birder Robson, MD;  Location: [place];  Service: Ophthalmology;  Laterality: Right;  . CATARACT EXTRACTION W/PHACO Left 08/08/2019   Procedure: CATARACT EXTRACTION PHACO AND INTRAOCULAR LENS PLACEMENT ([place]) LEFT;  Surgeon: Birder Robson, MD;  Location: [facility];  Service: Ophthalmology;  Laterality: Left;  Lot #[id number] H [place]: 00:34.5 CDE: 4.75  . COLONOSCOPY  06/24/2006   Dr. Bary Castilla. Multiple benign appearing 48mm polyps in the cecum and in the rectum. -Three 46mm polyps in the transversed colon, in the tranverse colon, proximal and in the distal transverse colon. Resected and retrieved.  . CYSTOSCOPY WITH HOLMIUM LASER LITHOTRIPSY  2001   kidney stones removed, [facility]  . LARYNGOSCOPY Right 04/01/2017   Procedure: SUSPENSION LARYNGOSCOPY WITH MICROFLAP EXCISION;  Surgeon: Carloyn Manner, MD;  Location: [facility];  Service: ENT;  Laterality: Right;   . sinus surgery   2002   [place]; removal of polyps  . TONSILLECTOMY  1954  . Tubular adenoma removed  06/24/2006    Prior to Admission medications   Medication Sig Start Date End Date Taking? Authorizing Provider  aspirin 81 MG tablet Take 81 mg by mouth daily.   Yes [provider]  diltiazem (CARDIZEM CD) 120 MG 24 hr capsule Take 120 mg by mouth daily.   Yes [provider]  hydrochlorothiazide (HYDRODIURIL) 25 MG tablet Take 1 tablet (25 mg total) by mouth daily. 01/30/20 04/29/20 Yes Agbor-Etang, Aaron Edelman, MD  Multiple Vitamins-Minerals (PRESERVISION AREDS 2 PO) Take 2 capsules by mouth every morning.    Yes [provider]  omeprazole (PRILOSEC) 20 MG capsule Take 20 mg by mouth daily before breakfast.   Yes [provider]  fluticasone (FLONASE) 50 MCG/ACT nasal spray Place 2 sprays into both nostrils every morning.     [provider]    Allergies as of 02/14/2020 - Review Complete 02/14/2020  Allergen Reaction Noted  . Penicillins Anaphylaxis and Swelling 04/12/2013    Family History  Problem Relation Age of Onset  . Lung cancer Mother   . Heart disease Father     Social History   Socioeconomic History  . Marital status: Married    Spouse name: Not on file  . Number of children: 2  . Years of education: Not on file  . Highest education level: 12th grade  Occupational History  .  Occupation: Truck Geophysicist/field seismologist  Tobacco Use  . Smoking status: Former Smoker    Packs/day: 1.00    Types: Cigarettes    Quit date: 12/2017    Years since quitting: 2.2  . Smokeless tobacco: Never Used  . Tobacco comment: started age 16 yo.previously smoked carton a week  Vaping Use  . Vaping Use: Never used  Substance and Sexual Activity  . Alcohol use: Not Currently    Alcohol/week: 0.0 standard drinks  . Drug use: No  . Sexual activity: Not on file  Other Topics Concern  . 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 Difficulty of Paying Living Expenses: Not hard at all  Food Insecurity: No Food Insecurity  . Worried About Charity fundraiser in the Last Year: Never true  . Ran Out of Food in the Last Year: Never true  Transportation Needs: No Transportation Needs  . Lack of Transportation (Medical): No  . Lack of Transportation (Non-Medical): No  Physical Activity: Inactive  . Days of Exercise per Week: 0 days  . Minutes of Exercise per Session: 0 min  Stress: No Stress Concern Present  . Feeling of Stress : Not at all  Social Connections: Moderately Isolated  . Frequency of Communication with Friends and Family: Twice a week  . Frequency of Social Gatherings with Friends and Family: Once a week  . Attends Religious Services: Never  . Active Member of Clubs or Organizations: No  . Attends Archivist Meetings: Never  . Marital Status: Married  Human resources officer Violence: Not At Risk  . Fear of Current or Ex-Partner: No  . Emotionally Abused: No  . Physically Abused: No  . Sexually Abused: No    Review of Systems: See HPI, otherwise negative ROS  Physical Exam: There were no vitals taken for this visit. General:   Alert,  pleasant and cooperative in NAD Head:  Normocephalic and atraumatic. Neck:  Supple; no masses or thyromegaly. Lungs:  Clear throughout to auscultation.    Heart:  Regular rate and rhythm. Abdomen:  Soft, nontender and nondistended. Normal bowel sounds, without guarding, and without rebound.   Neurologic:  Alert and  oriented x4;  grossly normal neurologically.  Impression/Plan: Jeffrey Torres is here for an colonoscopy to be performed for a history of adenomatous polyps on 07/2016  Risks, benefits, limitations, and alternatives regarding  colonoscopy have been reviewed with the patient.  Questions have been answered.  All parties agreeable.   Lucilla Lame, MD   03/12/2020, 8:19 AM

## 2020-03-12 NOTE — Op Note (Signed)
[facility] Patient Name: Jeffrey Torres Procedure Date: 03/12/2020 8:22 AM MRN: [id number] Account #: [id number] Date of Birth: Nov 28, 1945 Admit Type: Outpatient Age: 74 Room: [facility] ENDO ROOM 1 Gender: Male Note Status: Finalized Procedure:             Colonoscopy Indications:           High risk colon cancer surveillance: Personal history                         of colonic polyps Providers:             Lucilla Lame MD, MD Referring MD:          Kirstie Peri. Caryn Section, MD (Referring MD) Medicines:             Propofol per Anesthesia Complications:         No immediate complications. Procedure:             Pre-Anesthesia Assessment:                        - Prior to the procedure, a History and Physical was                         performed, and patient medications and allergies were                         reviewed. The patient's tolerance of previous                         anesthesia was also reviewed. The risks and benefits                         of the procedure and the sedation options and risks                         were discussed with the patient. All questions were                         answered, and informed consent was obtained. Prior                         Anticoagulants: The patient has taken no previous                         anticoagulant or antiplatelet agents. ASA Grade                         Assessment: II - A patient with mild systemic disease.                         After reviewing the risks and benefits, the patient                         was deemed in satisfactory condition to undergo the                         procedure.  After obtaining informed consent, the colonoscope was                         passed under direct vision. Throughout the procedure,                         the patient's blood pressure, pulse, and oxygen                         saturations were monitored continuously. The                          Colonoscope was introduced through the anus and                         advanced to the the cecum, identified by appendiceal                         orifice and ileocecal valve. The colonoscopy was                         performed without difficulty. The patient tolerated                         the procedure well. The quality of the bowel                         preparation was excellent. Findings:      The perianal and digital rectal examinations were normal.      A 5 mm polyp was found in the sigmoid colon. The polyp was sessile. The       polyp was removed with a cold snare. Resection and retrieval were       complete.      Four sessile polyps were found in the descending colon. The polyps were       3 to 6 mm in size. These polyps were removed with a cold snare.       Resection and retrieval were complete.      A 5 mm polyp was found in the transverse colon. The polyp was sessile.       The polyp was removed with a cold snare. Resection and retrieval were       complete.      Non-bleeding internal hemorrhoids were found during retroflexion. The       hemorrhoids were Grade I (internal hemorrhoids that do not prolapse). Impression:            - One 5 mm polyp in the sigmoid colon, removed with a                         cold snare. Resected and retrieved.                        - Four 3 to 6 mm polyps in the descending colon,                         removed with a cold snare. Resected and retrieved.                        -  One 5 mm polyp in the transverse colon, removed with                         a cold snare. Resected and retrieved.                        - Non-bleeding internal hemorrhoids. Recommendation:        - Discharge patient to home.                        - Resume previous diet.                        - Continue present medications.                        - Await pathology results.                        - Repeat colonoscopy in 3 years for  surveillance. Procedure Code(s):     --- Professional ---                        [phone], Colonoscopy, flexible; with removal of                         tumor(s), polyp(s), or other lesion(s) by snare                         technique Diagnosis Code(s):     --- Professional ---                        Z86.010, Personal history of colonic polyps                        K63.5, Polyp of colon CPT copyright 2019 American Medical Association. All rights reserved. The codes documented in this report are preliminary and upon coder review may  be revised to meet current compliance requirements. Lucilla Lame MD, MD 03/12/2020 9:00:53 AM This report has been signed electronically. Number of Addenda: 0 Note Initiated On: 03/12/2020 8:22 AM Scope Withdrawal Time: 0 hours 11 minutes 17 seconds  Total Procedure Duration: 0 hours 18 minutes 36 seconds  Estimated Blood Loss:  Estimated blood loss: none.      [facility]

## 2020-03-13 LAB — SURGICAL PATHOLOGY

## 2020-03-14 ENCOUNTER — Encounter: Payer: Self-pay | Admitting: Gastroenterology

## 2020-04-22 DIAGNOSIS — H353213 Exudative age-related macular degeneration, right eye, with inactive scar: Secondary | ICD-10-CM | POA: Diagnosis not present

## 2020-07-06 ENCOUNTER — Emergency Department: Payer: Medicare Other

## 2020-07-06 ENCOUNTER — Inpatient Hospital Stay
Admission: EM | Admit: 2020-07-06 | Discharge: 2020-07-11 | DRG: 871 | Disposition: A | Discharge status: Home Health | Payer: Medicare Other | Attending: Internal Medicine | Admitting: Internal Medicine

## 2020-07-06 ENCOUNTER — Other Ambulatory Visit: Payer: Self-pay

## 2020-07-06 DIAGNOSIS — J159 Unspecified bacterial pneumonia: Secondary | ICD-10-CM | POA: Diagnosis present

## 2020-07-06 DIAGNOSIS — I491 Atrial premature depolarization: Secondary | ICD-10-CM | POA: Diagnosis present

## 2020-07-06 DIAGNOSIS — Z8619 Personal history of other infectious and parasitic diseases: Secondary | ICD-10-CM

## 2020-07-06 DIAGNOSIS — R4182 Altered mental status, unspecified: Secondary | ICD-10-CM | POA: Diagnosis not present

## 2020-07-06 DIAGNOSIS — A4189 Other specified sepsis: Principal | ICD-10-CM | POA: Diagnosis present

## 2020-07-06 DIAGNOSIS — Z8249 Family history of ischemic heart disease and other diseases of the circulatory system: Secondary | ICD-10-CM

## 2020-07-06 DIAGNOSIS — J1282 Pneumonia due to coronavirus disease 2019: Secondary | ICD-10-CM | POA: Diagnosis present

## 2020-07-06 DIAGNOSIS — R531 Weakness: Secondary | ICD-10-CM | POA: Diagnosis not present

## 2020-07-06 DIAGNOSIS — G9340 Encephalopathy, unspecified: Secondary | ICD-10-CM

## 2020-07-06 DIAGNOSIS — M1990 Unspecified osteoarthritis, unspecified site: Secondary | ICD-10-CM | POA: Diagnosis present

## 2020-07-06 DIAGNOSIS — I493 Ventricular premature depolarization: Secondary | ICD-10-CM | POA: Diagnosis present

## 2020-07-06 DIAGNOSIS — I10 Essential (primary) hypertension: Secondary | ICD-10-CM | POA: Diagnosis present

## 2020-07-06 DIAGNOSIS — Z87892 Personal history of anaphylaxis: Secondary | ICD-10-CM

## 2020-07-06 DIAGNOSIS — Z79899 Other long term (current) drug therapy: Secondary | ICD-10-CM | POA: Diagnosis not present

## 2020-07-06 DIAGNOSIS — A419 Sepsis, unspecified organism: Secondary | ICD-10-CM | POA: Diagnosis not present

## 2020-07-06 DIAGNOSIS — U071 COVID-19: Secondary | ICD-10-CM | POA: Diagnosis present

## 2020-07-06 DIAGNOSIS — Z87891 Personal history of nicotine dependence: Secondary | ICD-10-CM

## 2020-07-06 DIAGNOSIS — Z7982 Long term (current) use of aspirin: Secondary | ICD-10-CM | POA: Diagnosis not present

## 2020-07-06 DIAGNOSIS — J9601 Acute respiratory failure with hypoxia: Secondary | ICD-10-CM | POA: Diagnosis not present

## 2020-07-06 DIAGNOSIS — Z6831 Body mass index (BMI) 31.0-31.9, adult: Secondary | ICD-10-CM

## 2020-07-06 DIAGNOSIS — R410 Disorientation, unspecified: Secondary | ICD-10-CM | POA: Diagnosis not present

## 2020-07-06 DIAGNOSIS — E870 Hyperosmolality and hypernatremia: Secondary | ICD-10-CM | POA: Diagnosis present

## 2020-07-06 DIAGNOSIS — G4733 Obstructive sleep apnea (adult) (pediatric): Secondary | ICD-10-CM | POA: Diagnosis present

## 2020-07-06 DIAGNOSIS — N179 Acute kidney failure, unspecified: Secondary | ICD-10-CM | POA: Diagnosis present

## 2020-07-06 DIAGNOSIS — H9190 Unspecified hearing loss, unspecified ear: Secondary | ICD-10-CM | POA: Diagnosis present

## 2020-07-06 DIAGNOSIS — R197 Diarrhea, unspecified: Secondary | ICD-10-CM | POA: Diagnosis not present

## 2020-07-06 DIAGNOSIS — R0902 Hypoxemia: Secondary | ICD-10-CM | POA: Diagnosis not present

## 2020-07-06 DIAGNOSIS — K219 Gastro-esophageal reflux disease without esophagitis: Secondary | ICD-10-CM | POA: Diagnosis present

## 2020-07-06 DIAGNOSIS — G9341 Metabolic encephalopathy: Secondary | ICD-10-CM | POA: Diagnosis present

## 2020-07-06 DIAGNOSIS — E872 Acidosis: Secondary | ICD-10-CM | POA: Diagnosis present

## 2020-07-06 DIAGNOSIS — E860 Dehydration: Secondary | ICD-10-CM | POA: Diagnosis present

## 2020-07-06 DIAGNOSIS — R059 Cough, unspecified: Secondary | ICD-10-CM | POA: Diagnosis not present

## 2020-07-06 DIAGNOSIS — R6520 Severe sepsis without septic shock: Secondary | ICD-10-CM | POA: Diagnosis present

## 2020-07-06 DIAGNOSIS — Z880 Allergy status to penicillin: Secondary | ICD-10-CM

## 2020-07-06 DIAGNOSIS — Z87442 Personal history of urinary calculi: Secondary | ICD-10-CM

## 2020-07-06 DIAGNOSIS — R0602 Shortness of breath: Secondary | ICD-10-CM | POA: Diagnosis not present

## 2020-07-06 DIAGNOSIS — H3530 Unspecified macular degeneration: Secondary | ICD-10-CM | POA: Diagnosis present

## 2020-07-06 DIAGNOSIS — Z8521 Personal history of malignant neoplasm of larynx: Secondary | ICD-10-CM

## 2020-07-06 DIAGNOSIS — Z9181 History of falling: Secondary | ICD-10-CM

## 2020-07-06 DIAGNOSIS — I517 Cardiomegaly: Secondary | ICD-10-CM | POA: Diagnosis not present

## 2020-07-06 DIAGNOSIS — J189 Pneumonia, unspecified organism: Secondary | ICD-10-CM | POA: Diagnosis present

## 2020-07-06 DIAGNOSIS — E669 Obesity, unspecified: Secondary | ICD-10-CM | POA: Diagnosis present

## 2020-07-06 LAB — CBC WITH DIFFERENTIAL/PLATELET
Abs Immature Granulocytes: 0.15 10*3/uL — ABNORMAL HIGH (ref 0.00–0.07)
Basophils Absolute: 0 10*3/uL (ref 0.0–0.1)
Basophils Relative: 0 %
Eosinophils Absolute: 0 10*3/uL (ref 0.0–0.5)
Eosinophils Relative: 1 %
HCT: 48.1 % (ref 39.0–52.0)
Hemoglobin: 15.8 g/dL (ref 13.0–17.0)
Immature Granulocytes: 2 %
Lymphocytes Relative: 9 %
Lymphs Abs: 0.7 10*3/uL (ref 0.7–4.0)
MCH: 30.6 pg (ref 26.0–34.0)
MCHC: 32.8 g/dL (ref 30.0–36.0)
MCV: 93.2 fL (ref 80.0–100.0)
Monocytes Absolute: 0.4 10*3/uL (ref 0.1–1.0)
Monocytes Relative: 5 %
Neutro Abs: 6.4 10*3/uL (ref 1.7–7.7)
Neutrophils Relative %: 83 %
Platelets: 533 10*3/uL — ABNORMAL HIGH (ref 150–400)
RBC: 5.16 MIL/uL (ref 4.22–5.81)
RDW: 13.8 % (ref 11.5–15.5)
WBC: 7.7 10*3/uL (ref 4.0–10.5)
nRBC: 0.4 % — ABNORMAL HIGH (ref 0.0–0.2)

## 2020-07-06 LAB — BLOOD GAS, VENOUS
Acid-Base Excess: 9.4 mmol/L — ABNORMAL HIGH (ref 0.0–2.0)
Bicarbonate: 34.9 mmol/L — ABNORMAL HIGH (ref 20.0–28.0)
O2 Saturation: 55.8 %
Patient temperature: 37
pCO2, Ven: 48 mmHg (ref 44.0–60.0)
pH, Ven: 7.47 — ABNORMAL HIGH (ref 7.250–7.430)
pO2, Ven: <31 mmHg — CL (ref 32.0–45.0)

## 2020-07-06 LAB — APTT: aPTT: 31 seconds (ref 24–36)

## 2020-07-06 LAB — COMPREHENSIVE METABOLIC PANEL
ALT: 33 U/L (ref 0–44)
AST: 38 U/L (ref 15–41)
Albumin: 2.7 g/dL — ABNORMAL LOW (ref 3.5–5.0)
Alkaline Phosphatase: 45 U/L (ref 38–126)
Anion gap: 15 (ref 5–15)
BUN: 37 mg/dL — ABNORMAL HIGH (ref 8–23)
CO2: 30 mmol/L (ref 22–32)
Calcium: 8 mg/dL — ABNORMAL LOW (ref 8.9–10.3)
Chloride: 104 mmol/L (ref 98–111)
Creatinine, Ser: 1.31 mg/dL — ABNORMAL HIGH (ref 0.61–1.24)
GFR, Estimated: 57 mL/min — ABNORMAL LOW (ref >60)
Glucose, Bld: 151 mg/dL — ABNORMAL HIGH (ref 70–99)
Potassium: 3.6 mmol/L (ref 3.5–5.1)
Sodium: 149 mmol/L — ABNORMAL HIGH (ref 135–145)
Total Bilirubin: 1.3 mg/dL — ABNORMAL HIGH (ref 0.3–1.2)
Total Protein: 7.9 g/dL (ref 6.5–8.1)

## 2020-07-06 LAB — POC SARS CORONAVIRUS 2 AG -  ED: SARS Coronavirus 2 Ag: NEGATIVE

## 2020-07-06 LAB — RESP PANEL BY RT-PCR (FLU A&B, COVID) ARPGX2
Influenza A by PCR: NEGATIVE
Influenza B by PCR: NEGATIVE
SARS Coronavirus 2 by RT PCR: POSITIVE — AB

## 2020-07-06 LAB — PROTIME-INR
INR: 1.2 (ref 0.8–1.2)
Prothrombin Time: 15.1 seconds (ref 11.4–15.2)

## 2020-07-06 LAB — PROCALCITONIN: Procalcitonin: 0.17 ng/mL

## 2020-07-06 LAB — LACTIC ACID, PLASMA: Lactic Acid, Venous: 2.1 mmol/L (ref 0.5–1.9)

## 2020-07-06 MED ORDER — ENOXAPARIN SODIUM 60 MG/0.6ML ~~LOC~~ SOLN
50.0000 mg | SUBCUTANEOUS | Status: DC
  Administered 2020-07-06 – 2020-07-09 (×4): 50 mg via SUBCUTANEOUS
  Filled 2020-07-06 (×4): qty 0.6

## 2020-07-06 MED ORDER — GUAIFENESIN ER 600 MG PO TB12
600.0000 mg | ORAL_TABLET | Freq: Two times a day (BID) | ORAL | Status: DC
  Administered 2020-07-06 – 2020-07-11 (×6): 600 mg via ORAL
  Filled 2020-07-06 (×8): qty 1

## 2020-07-06 MED ORDER — LACTATED RINGERS IV BOLUS (SEPSIS): 1000.0000 mL | Freq: Once | INTRAVENOUS | Status: DC

## 2020-07-06 MED ORDER — SODIUM CHLORIDE 0.9 % IV BOLUS (SEPSIS)
1000.0000 mL | Freq: Once | INTRAVENOUS | Status: AC
  Administered 2020-07-06: 1000 mL via INTRAVENOUS

## 2020-07-06 MED ORDER — DILTIAZEM HCL ER COATED BEADS 120 MG PO CP24
120.0000 mg | ORAL_CAPSULE | Freq: Every day | ORAL | Status: DC
Start: 2020-07-06 — End: 2020-07-11
  Administered 2020-07-09 – 2020-07-11 (×3): 120 mg via ORAL
  Filled 2020-07-06 (×6): qty 1

## 2020-07-06 MED ORDER — VANCOMYCIN HCL IN DEXTROSE 1-5 GM/200ML-% IV SOLN
1000.0000 mg | Freq: Once | INTRAVENOUS | Status: DC
  Filled 2020-07-06: qty 200

## 2020-07-06 MED ORDER — ENOXAPARIN SODIUM 30 MG/0.3ML ~~LOC~~ SOLN: 30.0000 mg | SUBCUTANEOUS | Status: DC

## 2020-07-06 MED ORDER — LACTATED RINGERS IV BOLUS (SEPSIS): 500.0000 mL | Freq: Once | INTRAVENOUS | Status: DC

## 2020-07-06 MED ORDER — LACTATED RINGERS IV BOLUS (SEPSIS)
1000.0000 mL | Freq: Once | INTRAVENOUS | Status: AC
  Administered 2020-07-06: 1000 mL via INTRAVENOUS

## 2020-07-06 MED ORDER — VANCOMYCIN HCL 1250 MG/250ML IV SOLN
1250.0000 mg | INTRAVENOUS | Status: DC
  Filled 2020-07-06: qty 250

## 2020-07-06 MED ORDER — ASPIRIN 81 MG PO CHEW
81.0000 mg | CHEWABLE_TABLET | Freq: Every day | ORAL | Status: DC
  Administered 2020-07-06 – 2020-07-11 (×4): 81 mg via ORAL
  Filled 2020-07-06 (×5): qty 1

## 2020-07-06 MED ORDER — LEVOFLOXACIN IN D5W 750 MG/150ML IV SOLN: 750.0000 mg | Freq: Once | INTRAVENOUS | Status: DC

## 2020-07-06 MED ORDER — VANCOMYCIN HCL IN DEXTROSE 1-5 GM/200ML-% IV SOLN: 1000.0000 mg | Freq: Once | INTRAVENOUS | Status: DC

## 2020-07-06 MED ORDER — ALBUTEROL SULFATE (2.5 MG/3ML) 0.083% IN NEBU: 2.5000 mg | INHALATION_SOLUTION | RESPIRATORY_TRACT | Status: DC | PRN

## 2020-07-06 MED ORDER — METOPROLOL TARTRATE 5 MG/5ML IV SOLN
5.0000 mg | Freq: Four times a day (QID) | INTRAVENOUS | Status: DC
  Administered 2020-07-06 – 2020-07-10 (×14): 5 mg via INTRAVENOUS
  Filled 2020-07-06 (×14): qty 5

## 2020-07-06 MED ORDER — METRONIDAZOLE IN NACL 5-0.79 MG/ML-% IV SOLN
500.0000 mg | Freq: Once | INTRAVENOUS | Status: AC
  Administered 2020-07-06: 500 mg via INTRAVENOUS
  Filled 2020-07-06: qty 100

## 2020-07-06 MED ORDER — FLUTICASONE PROPIONATE 50 MCG/ACT NA SUSP
2.0000 | NASAL | Status: DC
  Administered 2020-07-09 – 2020-07-10 (×2): 2 via NASAL
  Filled 2020-07-06 (×2): qty 16

## 2020-07-06 MED ORDER — DEXAMETHASONE SODIUM PHOSPHATE 10 MG/ML IJ SOLN
6.0000 mg | Freq: Every day | INTRAMUSCULAR | Status: DC
  Administered 2020-07-06: 6 mg via INTRAVENOUS
  Filled 2020-07-06: qty 1

## 2020-07-06 MED ORDER — LACTATED RINGERS IV SOLN: INTRAVENOUS | Status: DC

## 2020-07-06 MED ORDER — LEVOFLOXACIN IN D5W 750 MG/150ML IV SOLN
750.0000 mg | INTRAVENOUS | Status: DC
  Administered 2020-07-06 – 2020-07-08 (×3): 750 mg via INTRAVENOUS
  Filled 2020-07-06 (×3): qty 150

## 2020-07-06 MED ORDER — PANTOPRAZOLE SODIUM 40 MG PO TBEC
40.0000 mg | DELAYED_RELEASE_TABLET | Freq: Every day | ORAL | Status: DC
  Administered 2020-07-06 – 2020-07-11 (×5): 40 mg via ORAL
  Filled 2020-07-06 (×5): qty 1

## 2020-07-06 MED ORDER — SODIUM CHLORIDE 0.45 % IV SOLN: INTRAVENOUS | Status: DC

## 2020-07-06 MED ORDER — SODIUM CHLORIDE 0.9 % IV SOLN
2.0000 g | Freq: Once | INTRAVENOUS | Status: AC
  Administered 2020-07-06: 2 g via INTRAVENOUS
  Filled 2020-07-06: qty 2

## 2020-07-06 MED ORDER — VANCOMYCIN HCL IN DEXTROSE 1-5 GM/200ML-% IV SOLN
1000.0000 mg | Freq: Once | INTRAVENOUS | Status: AC
  Administered 2020-07-06 (×2): 1000 mg via INTRAVENOUS
  Filled 2020-07-06: qty 200

## 2020-07-06 NOTE — Progress Notes (Signed)
PHARMACY -  BRIEF ANTIBIOTIC NOTE   Pharmacy has received consult(s) for vancomycin and aztreonam from an ED provider.  The patient's profile has been reviewed for ht/wt/allergies/indication/available labs.    Pt does not appear to have hx of cephalosporin use.   One time order(s) placed for vancomycin 1 g IV x1 and aztreonam 2 g IV x1 by the EDP.   Will order another vancomycin 1 g IV x1 to complete total 2 loading dose.   Further antibiotics/pharmacy consults should be ordered by admitting physician if indicated.                       Thank you, Rocky Morel 07/06/2020  10:46 AM

## 2020-07-06 NOTE — Progress Notes (Signed)
elink monitoring sepsis

## 2020-07-06 NOTE — ED Notes (Signed)
Please call daughter, Hal Hope [phone] with update.

## 2020-07-06 NOTE — H&P (Addendum)
Jeffrey Torres is an 75 y.o. male.   Chief Complaint: Patient was brought in by family after he was noted to be confused from his baseline.  Patient is a poor history at the time of presentation hemce was unable to provide any appropriate history.  HPI:  History was obtained from daughter(listed on chart as next of kin) Patient is a 75 year old Caucasian male who lives by himself, IADL and ADL independent.  Patient is unvaccinated for Covid.  He had been in his usual state of health until Christmas Eve when he had complained of feeling unwell with cough and URI symptoms.  Patient did not seek any medical attention at the time.  As per daughter,since 5 days ago, patient was noted to have worsening altered mental status with poor oral intake.  Family able to monitor patient's activities using the camera.  Patient apparently had reported to family members that he had a fall in his bathtub within the past few days.  Fall were unwitnessed.  No known seizures.  Today, patient was noted to be significantly weak and altered in mental status hence family decided to bring patient into the emergency room.  Patient was reported to be hypoxic on presentation requiring nonrebreather mask.  He is currently being weaned down to nasal cannula.  Initial Covid test came back negative on the antigen test.  Chest x-ray however concerning for multifocal pneumonia.  Patient was empirically initiated on broad-spectrum empiric antibiotics due to questionable allergy to penicillin.     Past Medical History:  Diagnosis Date  . Arthritis   . [facility])    larynx ca, surgery and radiation  . GERD (gastroesophageal reflux disease)   . Heart murmur   . Hernia, inguinal, bilateral   . History of chicken pox   . History of kidney stones   . History of measles   . History of mumps   . History of shingles   . HOH (hard of hearing)   . Macular degeneration of both eyes   . Motion sickness    ocean boat  . PVC (premature  ventricular contraction)     Past Surgical History:  Procedure Laterality Date  . CARDIAC CATHETERIZATION  2010   normal per patient report  . CATARACT EXTRACTION W/PHACO Right 07/18/2019   Procedure: CATARACT EXTRACTION PHACO AND INTRAOCULAR LENS PLACEMENT (I[facility]) RIGHT 4.54 00:29.7;  Surgeon: Birder Robson, MD;  Location: [place];  Service: Ophthalmology;  Laterality: Right;  . CATARACT EXTRACTION W/PHACO Left 08/08/2019   Procedure: CATARACT EXTRACTION PHACO AND INTRAOCULAR LENS PLACEMENT ([place]) LEFT;  Surgeon: Birder Robson, MD;  Location: [facility] ORS;  Service: Ophthalmology;  Laterality: Left;  Lot #[id number] H [place]: 00:34.5 CDE: 4.75  . COLONOSCOPY  06/24/2006   Dr. Bary Castilla. Multiple benign appearing 28mm polyps in the cecum and in the rectum. -Three 26mm polyps in the transversed colon, in the tranverse colon, proximal and in the distal transverse colon. Resected and retrieved.  . COLONOSCOPY WITH PROPOFOL N/A 03/12/2020   Procedure: COLONOSCOPY WITH PROPOFOL;  Surgeon: Lucilla Lame, MD;  Location: [facility] ENDOSCOPY;  Service: Endoscopy;  Laterality: N/A;  . CYSTOSCOPY WITH HOLMIUM LASER LITHOTRIPSY  2001   kidney stones removed, [facility]  . LARYNGOSCOPY Right 04/01/2017   Procedure: SUSPENSION LARYNGOSCOPY WITH MICROFLAP EXCISION;  Surgeon: Carloyn Manner, MD;  Location: [facility] ORS;  Service: ENT;  Laterality: Right;  . sinus surgery   2002   [place]; removal of polyps  . TONSILLECTOMY  1954  . Tubular adenoma  removed  06/24/2006    Family History  Problem Relation Age of Onset  . Lung cancer Mother   . Heart disease Father    Social History:  reports that he quit smoking about 2 years ago. His smoking use included cigarettes. He smoked 1.00 pack per day. He has never used smokeless tobacco. He reports previous alcohol use. He reports that he does not use drugs.  Allergies:  Allergies  Allergen Reactions  . Penicillins Anaphylaxis and Swelling    Did it involve swelling  of the face/tongue/throat, SOB, or low BP? Yes Did it involve sudden or severe rash/hives, skin peeling, or any reaction on the inside of your mouth or nose? No Did you need to seek medical attention at a hospital or doctor's office? No When did it last happen?10 + years ago If all above answers are "NO", may proceed with cephalosporin use.     (Not in a hospital admission)   Results for orders placed or performed during the hospital encounter of 07/06/20 (from the past 48 hour(s))  Lactic acid, plasma     Status: Abnormal   Collection Time: 07/06/20 10:22 AM  Result Value Ref Range   Lactic Acid, Venous 2.1 (HH) 0.5 - 1.9 mmol/L    Comment: CRITICAL RESULT CALLED TO, READ BACK BY AND VERIFIED WITH JESSICA COLTRANE AT 7412 07/06/20.PMF Performed at [facility], [address] [place], [place] [address]   Comprehensive metabolic panel     Status: Abnormal   Collection Time: 07/06/20 10:22 AM  Result Value Ref Range   Sodium 149 (H) 135 - 145 mmol/L   Potassium 3.6 3.5 - 5.1 mmol/L   Chloride 104 98 - 111 mmol/L   CO2 30 22 - 32 mmol/L   Glucose, Bld 151 (H) 70 - 99 mg/dL    Comment: Glucose reference range applies only to samples taken after fasting for at least 8 hours.   BUN 37 (H) 8 - 23 mg/dL   Creatinine, Ser 1.31 (H) 0.61 - 1.24 mg/dL   Calcium 8.0 (L) 8.9 - 10.3 mg/dL   Total Protein 7.9 6.5 - 8.1 g/dL   Albumin 2.7 (L) 3.5 - 5.0 g/dL   AST 38 15 - 41 U/L   ALT 33 0 - 44 U/L   Alkaline Phosphatase 45 38 - 126 U/L   Total Bilirubin 1.3 (H) 0.3 - 1.2 mg/dL   GFR, Estimated 57 (L) >60 mL/min    Comment: (NOTE) Calculated using the CKD-EPI Creatinine Equation (2021)    Anion gap 15 5 - 15    Comment: Performed at [facility], [address] [place], [place] [address]  CBC WITH DIFFERENTIAL     Status: Abnormal   Collection Time: 07/06/20 10:22 AM  Result Value Ref Range   WBC 7.7 4.0 - 10.5 K/uL   RBC 5.16 4.22 - 5.81 MIL/uL   Hemoglobin  15.8 13.0 - 17.0 g/dL   HCT 48.1 39.0 - 52.0 %   MCV 93.2 80.0 - 100.0 fL   MCH 30.6 26.0 - 34.0 pg   MCHC 32.8 30.0 - 36.0 g/dL   RDW 13.8 11.5 - 15.5 %   Platelets 533 (H) 150 - 400 K/uL   nRBC 0.4 (H) 0.0 - 0.2 %   Neutrophils Relative % 83 %   Neutro Abs 6.4 1.7 - 7.7 K/uL   Lymphocytes Relative 9 %   Lymphs Abs 0.7 0.7 - 4.0 K/uL   Monocytes Relative 5 %  Monocytes Absolute 0.4 0.1 - 1.0 K/uL   Eosinophils Relative 1 %   Eosinophils Absolute 0.0 0.0 - 0.5 K/uL   Basophils Relative 0 %   Basophils Absolute 0.0 0.0 - 0.1 K/uL   Immature Granulocytes 2 %   Abs Immature Granulocytes 0.15 (H) 0.00 - 0.07 K/uL    Comment: Performed at [facility], [address] [place], [place] [address]  Protime-INR     Status: None   Collection Time: 07/06/20 10:22 AM  Result Value Ref Range   Prothrombin Time 15.1 11.4 - 15.2 seconds   INR 1.2 0.8 - 1.2    Comment: (NOTE) INR goal varies based on device and disease states. Performed at [facility], [address] [place], [place] [address]   APTT     Status: None   Collection Time: 07/06/20 10:22 AM  Result Value Ref Range   aPTT 31 24 - 36 seconds    Comment: Performed at [facility], [address] [place],  [address]  Procalcitonin - Baseline     Status: None   Collection Time: 07/06/20 10:22 AM  Result Value Ref Range   Procalcitonin 0.17 ng/mL    Comment:        Interpretation: PCT (Procalcitonin) <= 0.5 ng/mL: Systemic infection (sepsis) is not likely. Local bacterial infection is possible. (NOTE)       Sepsis PCT Algorithm           Lower Respiratory Tract                                      Infection PCT Algorithm    ----------------------------     ----------------------------         PCT < 0.25 ng/mL                PCT < 0.10 ng/mL          Strongly encourage             Strongly discourage   discontinuation of antibiotics    initiation of antibiotics     ----------------------------     -----------------------------       PCT 0.25 - 0.50 ng/mL            PCT 0.10 - 0.25 ng/mL               OR       >80% decrease in PCT            Discourage initiation of                                            antibiotics      Encourage discontinuation           of antibiotics    ----------------------------     -----------------------------         PCT >= 0.50 ng/mL              PCT 0.26 - 0.50 ng/mL               AND        <80% decrease in PCT             Encourage initiation of  antibiotics       Encourage continuation           of antibiotics    ----------------------------     -----------------------------        PCT >= 0.50 ng/mL                  PCT > 0.50 ng/mL               AND         increase in PCT                  Strongly encourage                                      initiation of antibiotics    Strongly encourage escalation           of antibiotics                                     -----------------------------                                           PCT <= 0.25 ng/mL                                                 OR                                        > 80% decrease in PCT                                      Discontinue / Do not initiate                                             antibiotics  Performed at [facility], [address] [place], [place] [address]   Blood gas, venous     Status: Abnormal   Collection Time: 07/06/20 10:34 AM  Result Value Ref Range   pH, Ven 7.47 (H) 7.250 - 7.430   pCO2, Ven 48 44.0 - 60.0 mmHg   pO2, Ven <31.0 (LL) 32.0 - 45.0 mmHg   Bicarbonate 34.9 (H) 20.0 - 28.0 mmol/L   Acid-Base Excess 9.4 (H) 0.0 - 2.0 mmol/L   O2 Saturation 55.8 %   Patient temperature 37.0    Sample type VENOUS     Comment: Performed at [facility], [address] [place], [place] [address]  POC SARS Coronavirus 2 Ag-ED - Nasal Swab      Status: None   Collection Time: 07/06/20 11:12 AM  Result Value Ref Range   SARS Coronavirus 2 Ag NEGATIVE NEGATIVE    Comment: (NOTE) SARS-CoV-2 antigen NOT DETECTED.   Negative results are presumptive.  Negative results do  not preclude SARS-CoV-2 infection and should not be used as the sole basis for treatment or other patient management decisions, including infection  control decisions, particularly in the presence of clinical signs and  symptoms consistent with COVID-19, or in those who have been in contact with the virus.  Negative results must be combined with clinical observations, patient history, and epidemiological information. The expected result is Negative.  Fact Sheet for Patients: PodPark.tn  Fact Sheet for Healthcare Providers: GiftContent.is   This test is not yet approved or cleared by the [place] FDA and  has been authorized for detection and/or diagnosis of SARS-CoV-2 by FDA under an Emergency Use Authorization (EUA).  This EUA will remain in effect (meaning this test can be used) for the duration of  the C OVID-19 declaration under Section 564(b)(1) of the Act, 21 U.S.C. section 360bbb-3(b)(1), unless the authorization is terminated or revoked sooner.     CT Head Wo Contrast  Result Date: 07/06/2020 CLINICAL DATA:  Mental status change. EXAM: CT HEAD WITHOUT CONTRAST TECHNIQUE: Contiguous axial images were obtained from the base of the skull through the vertex without intravenous contrast. COMPARISON:  None. FINDINGS: Brain: No evidence of acute infarction, hemorrhage, hydrocephalus, extra-axial collection or mass lesion/mass effect. Vascular: No hyperdense vessel or unexpected calcification. Skull: Normal. Negative for fracture or focal lesion. Sinuses/Orbits: There is a small amount of fluid posteriorly in the sphenoid sinuses. Opacification of scattered ethmoid air cells identified. Other: None.  IMPRESSION: 1. No acute intracranial abnormality. 2. Sinus disease as above. Electronically Signed   By: Dorise Bullion III M.D   On: 07/06/2020 12:38   DG Chest Port 1 View  Result Date: 07/06/2020 CLINICAL DATA:  Possible sepsis. EXAM: PORTABLE CHEST 1 VIEW COMPARISON:  None. FINDINGS: Mild cardiomegaly. Diffuse bilateral airspace opacities, most likely acute, most likely pneumonia. No pleural effusion or pneumothorax is seen. Osseous structures about the chest are unremarkable. IMPRESSION: 1. Diffuse bilateral airspace opacities, most likely acute, most likely multifocal pneumonia. 2. Mild cardiomegaly. Electronically Signed   By: Franki Cabot M.D.   On: 07/06/2020 10:56    Review of Systems  Unable to perform ROS: Mental status change    Blood pressure (!) 155/78, pulse 73, temperature 100.1 F (37.8 C), temperature source Oral, resp. rate (!) 28, height 5\' 10"  (1.778 m), weight 100 kg, SpO2 96 %. Physical Exam Vitals and nursing note reviewed.  Constitutional:      Appearance: He is normal weight. He is ill-appearing.  HENT:     Head: Normocephalic.  Cardiovascular:     Rate and Rhythm: Normal rate and regular rhythm.  Pulmonary:     Effort: Tachypnea present.     Breath sounds: Decreased breath sounds present.  Abdominal:     Palpations: Abdomen is soft.  Musculoskeletal:     Cervical back: Normal range of motion and neck supple.     Right lower leg: No edema.     Left lower leg: No edema.  Skin:    General: Skin is warm.     Capillary Refill: Capillary refill takes less than 2 seconds.  Neurological:     General: No focal deficit present.     Mental Status: He is alert. He is disoriented.      Assessment/Plan 75 year old gentleman with 2 weeks history of upper respiratory tract infection characterized by cough and generalized weakness.  Symptoms have worsened over the past couple weeks with patient presenting with acute altered mental status from baseline.  Chest  x-ray  concerning for multifocal pneumonia.  Initial Covid antigen test was negative.  #1.  Multifocal pneumonia with sepsis characterized by tachypnea, elevated lactic acidosis, focus of infection and evidence of endorgan damage.  Symptoms are suspicious for viral pneumonia with the most possible culprit being Covid.  Other differentials include secondary bacterial pneumonia.  Respiratory panel will be sent.  Patient was empirically covered with IV antibiotics.  Oxygen supplementation advised.  Awaiting blood cultures and sputum cultures.  Will de-escalate antibiotics as needed.  #2.  Acute encephalopathy most likely metabolic in etiology from sepsis and hypernatremia.  We will change IV fluids to hypotonic saline with half-normal saline.  Encourage free water intake.  Brain CT scan on admission ruled out any evidence of acute infarction versus intracranial hemorrhage.  #3.  Mild acute kidney injury most likely due to dehydration.  Patient was volume repleted on admission.  Sepsis protocol with Ringer's lactate and subsequently with half-normal saline advised.  #4.  History of squamous cell cancer of the larynx.  #5 . History of hypertension: Blood pressure stable.  patient is on Cardizem and hydrochlorothiazide.  Will hold HCTZ for now.  #6.  History of bigeminy.  Patient was previously worked up by cardiology.  Frequent PVCs and PACs seen at that time.  Patient will be on telemetry during the course of stay.  Continue with Cardizem.   Addendum: COVID 19 PCR came back positive confirming diagnosis. Patient is however about 15 days post onset of symptoms.  [place], MD 07/06/2020, 1:27 PM

## 2020-07-06 NOTE — Progress Notes (Signed)
PHARMACIST - PHYSICIAN COMMUNICATION  CONCERNING:  Enoxaparin (Lovenox) for DVT Prophylaxis    RECOMMENDATION: Patient was prescribed enoxaprin 40mg  q24 hours for VTE prophylaxis.   Filed Weights   07/06/20 1012  Weight: 100 kg (220 lb 7.4 oz)    Body mass index is 31.63 kg/m.  Estimated Creatinine Clearance: 59.5 mL/min (A) (by C-G formula based on SCr of 1.31 mg/dL (H)).   Based on [place] patient is candidate for enoxaparin 0.5mg /kg TBW SQ every 24 hours based on BMI being >30.   DESCRIPTION: Pharmacy has adjusted enoxaparin dose per [facility] policy.  Patient is now receiving enoxaparin 50 mg every 24 hours    Jeffrey Torres, PharmD Clinical Pharmacist  07/06/2020 1:21 PM

## 2020-07-06 NOTE — ED Notes (Signed)
Room discheveled. Pt sitting on bench, states "I don't know what I'm supposed to be doing. Room cleaned. Pt redirected to bed. Oxygen 80%. [place] reapplied and increased to 6 liter's.

## 2020-07-06 NOTE — Progress Notes (Signed)
Notified bedside nurse of need to draw repeat lactic acid.  Message sent to assigned bedside RN. Inquiring when repeat lactic will be drawn. Bedside RN replied back she had been with a different patient coding for an extended amount of time.

## 2020-07-06 NOTE — Progress Notes (Signed)
Pharmacy Antibiotic Note  Jeffrey Torres is a 75 y.o. male admitted on 07/06/2020 with pneumonia.  Pharmacy has been consulted for levofloxacin and vancomycin dosing.  Pt given aztreonam 2g IV x1, Flagyl 500 mg IV x1, and ordered total of vancomycin 2g loading dose (doses in progress) in the ED.   Plan: Levofloxacin 750 mg IV q24h  Vancomycin 1250 mg IV Q 24 hrs starting tomorrow. Goal AUC 400-550. Expected AUC: 527 Expected Cmin: 12.6 mcg/mL SCr used: 1.31  F/u MRSA PCR ordered to help guide therapy. Will need to closely monitor renal function. BMP already ordered for AM.    Height: 5\' 10"  (177.8 cm) Weight: 100 kg (220 lb 7.4 oz) IBW/kg (Calculated) : 73  Temp (24hrs), Avg:100.1 F (37.8 C), Min:100.1 F (37.8 C), Max:100.1 F (37.8 C)  Recent Labs  Lab 07/06/20 1022  WBC 7.7  CREATININE 1.31*  LATICACIDVEN 2.1*    Estimated Creatinine Clearance: 59.5 mL/min (A) (by C-G formula based on SCr of 1.31 mg/dL (H)).    Allergies  Allergen Reactions  . Penicillins Anaphylaxis and Swelling    Did it involve swelling of the face/tongue/throat, SOB, or low BP? Yes Did it involve sudden or severe rash/hives, skin peeling, or any reaction on the inside of your mouth or nose? No Did you need to seek medical attention at a hospital or doctor's office? No When did it last happen?10 + years ago If all above answers are "NO", may proceed with cephalosporin use.     Antimicrobials this admission: Aztreonam, Flagyl 1/8 x1 Levaquin 1/8 >> Vancomycin 1/8 >>  Dose adjustments this admission:   Microbiology results: 1/8 BCx: collected 1/8 UCx: ordered  1/8 Sputum: ordered  1/8 MRSA PCR: ordered  Thank you for allowing pharmacy to be a part of this patient's care.  Rocky Morel 07/06/2020 1:46 PM

## 2020-07-06 NOTE — ED Notes (Signed)
Patient transported to CT 

## 2020-07-06 NOTE — Progress Notes (Signed)
CODE SEPSIS - PHARMACY COMMUNICATION  **Broad Spectrum Antibiotics should be administered within 1 hour of Sepsis diagnosis**  Time Code Sepsis Called/Page Received: 1019 per consult  Antibiotics Ordered: aztreonam/Flagyl/vanc  Time of 1st antibiotic administration: 1115  Additional action taken by pharmacy: Called RN at 1109 about timing of abx  If necessary, Name of Provider/Nurse Contacted:    Rocky Morel ,PharmD Clinical Pharmacist  07/06/2020  11:10 AM

## 2020-07-06 NOTE — ED Triage Notes (Signed)
Pt comes EMS from home with SOB, n/v/d since christmas. Has become more lethargic, unable to walk around house, and new confusion per family at scene. Pt normally AOX4, amb. At this time is oriented to self only. EMS place dpt on nrb at 15L but pt is now 94% on 3L [place].

## 2020-07-06 NOTE — ED Provider Notes (Signed)
Jeffrey Torres  ____________________________________________   Event Date/Time   First MD Initiated Contact with Patient 07/06/20 1016     (approximate)  I have reviewed the triage vital signs and the nursing notes.   HISTORY  Chief Complaint Shortness of Breath    HPI Jeffrey Torres is a 75 y.o. male  Here with confusion, AMS. Per report from EMS, they were called to pt's house. He reportedly has been coughing and progressively weaker since around Christmas. Over the past 24 hours, he has been more confused, and reportedly EMS had to break into his house today b/c he couldn't open the door. He has been coughing, not eating/drinking much, and has had some diarrhea as well. Per report, pt is normally oriented and able to care for himself.   On my assessment, pt oriented to person only. He is unable to recall why he is here.  Level 5 caveat invoked as remainder of history, ROS, and physical exam limited due to patient's confusion.         Past Medical History:  Diagnosis Date  . Arthritis   . Cancer [facility])    larynx ca, surgery and radiation  . GERD (gastroesophageal reflux disease)   . Heart murmur   . Hernia, inguinal, bilateral   . History of chicken pox   . History of kidney stones   . History of measles   . History of mumps   . History of shingles   . HOH (hard of hearing)   . Macular degeneration of both eyes   . Motion sickness    ocean boat  . PVC (premature ventricular contraction)     Patient Active Problem List   Diagnosis Date Noted  . History of colonic polyps   . Polyp of descending colon   . Severe obstructive sleep apnea 02/21/2020  . Essential hypertension 08/14/2019  . Atrial bigeminy 08/14/2019  . History of laryngeal cancer 12/28/2017  . History of adenomatous polyp of colon 10/30/2015  . Inguinal hernia, bilateral 10/21/2015  . PVC (premature ventricular contraction) 10/21/2015  .  Glucosuria 10/21/2015  . Health maintenance examination 04/19/2013  . Tobacco dependence 04/19/2013  . Umbilical hernia 85/46/2703    Past Surgical History:  Procedure Laterality Date  . CARDIAC CATHETERIZATION  2010   normal per patient report  . CATARACT EXTRACTION W/PHACO Right 07/18/2019   Procedure: CATARACT EXTRACTION PHACO AND INTRAOCULAR LENS PLACEMENT (IOC) RIGHT 4.54 00:29.7;  Surgeon: Birder Robson, MD;  Location: [place];  Service: Ophthalmology;  Laterality: Right;  . CATARACT EXTRACTION W/PHACO Left 08/08/2019   Procedure: CATARACT EXTRACTION PHACO AND INTRAOCULAR LENS PLACEMENT ([place]) LEFT;  Surgeon: Birder Robson, MD;  Location: [facility] ORS;  Service: Ophthalmology;  Laterality: Left;  Lot #[id number] H [place]: 00:34.5 CDE: 4.75  . COLONOSCOPY  06/24/2006   Dr. Bary Castilla. Multiple benign appearing 1mm polyps in the cecum and in the rectum. -Three 13mm polyps in the transversed colon, in the tranverse colon, proximal and in the distal transverse colon. Resected and retrieved.  . COLONOSCOPY WITH PROPOFOL N/A 03/12/2020   Procedure: COLONOSCOPY WITH PROPOFOL;  Surgeon: Lucilla Lame, MD;  Location: [facility]COPY;  Service: Endoscopy;  Laterality: N/A;  . CYSTOSCOPY WITH HOLMIUM LASER LITHOTRIPSY  2001   kidney stones removed, [facility]  . LARYNGOSCOPY Right 04/01/2017   Procedure: SUSPENSION LARYNGOSCOPY WITH MICROFLAP EXCISION;  Surgeon: Carloyn Manner, MD;  Location: [facility] ORS;  Service: ENT;  Laterality: Right;  . sinus surgery  2002   [place]; removal of polyps  . TONSILLECTOMY  1954  . Tubular adenoma removed  06/24/2006    Prior to Admission medications   Medication Sig Start Date End Date Taking? Authorizing Provider  aspirin 81 MG tablet Take 81 mg by mouth daily.    [provider]  diltiazem (CARDIZEM CD) 120 MG 24 hr capsule Take 120 mg by mouth daily.    [provider]  fluticasone (FLONASE) 50 MCG/ACT nasal spray Place 2 sprays into  both nostrils every morning.     [provider]  hydrochlorothiazide (HYDRODIURIL) 25 MG tablet Take 1 tablet (25 mg total) by mouth daily. 01/30/20 04/29/20  Kate Sable, MD  Multiple Vitamins-Minerals (PRESERVISION AREDS 2 PO) Take 2 capsules by mouth every morning.     [provider]  omeprazole (PRILOSEC) 20 MG capsule Take 20 mg by mouth daily before breakfast.    [provider]    Allergies Penicillins  Family History  Problem Relation Age of Onset  . Lung cancer Mother   . Heart disease Father     Social History Social History   Tobacco Use  . Smoking status: Former Smoker    Packs/day: 1.00    Types: Cigarettes    Quit date: 12/2017    Years since quitting: 2.5  . Smokeless tobacco: Never Used  . Tobacco comment: started age 47 yo.previously smoked carton a week  Vaping Use  . Vaping Use: Never used  Substance Use Topics  . Alcohol use: Not Currently    Alcohol/week: 0.0 standard drinks  . Drug use: No    Review of Systems  Review of Systems  Unable to perform ROS: Mental status change     ____________________________________________  PHYSICAL EXAM:      VITAL SIGNS: ED Triage Vitals  Enc Vitals Group     BP 07/06/20 1011 (!) 143/86     Pulse Rate 07/06/20 1011 83     Resp 07/06/20 1011 18     Temp 07/06/20 1011 100.1 F (37.8 C)     Temp Source 07/06/20 1011 Oral     SpO2 07/06/20 1011 95 %     Weight 07/06/20 1012 220 lb 7.4 oz (100 kg)     Height 07/06/20 1012 5\' 10"  (1.778 m)     Head Circumference --      Peak Flow --      Pain Score 07/06/20 1011 5     Pain Loc --      Pain Edu? --      Excl. in [place]? --      Physical Exam Vitals and nursing Torres reviewed.  Constitutional:      General: He is not in acute distress.    Appearance: He is well-developed.  HENT:     Head: Normocephalic and atraumatic.  Eyes:     Conjunctiva/sclera: Conjunctivae normal.  Cardiovascular:     Rate and Rhythm: Normal rate  and regular rhythm.     Heart sounds: Normal heart sounds. No murmur heard. No friction rub.  Pulmonary:     Effort: Pulmonary effort is normal. No respiratory distress.     Breath sounds: Examination of the right-lower field reveals rales. Examination of the left-lower field reveals rales. Rales present. No wheezing.  Abdominal:     General: There is no distension.     Palpations: Abdomen is soft.     Tenderness: There is no abdominal tenderness.     Comments: Mild RLQ  TTP  Musculoskeletal:     Cervical back: Neck supple.  Skin:    General: Skin is warm.     Capillary Refill: Capillary refill takes less than 2 seconds.  Neurological:     Mental Status: He is alert. He is disoriented.     Motor: No abnormal muscle tone.     Comments: Oriented to person only       ____________________________________________   LABS (all labs ordered are listed, but only abnormal results are displayed)  Labs Reviewed  LACTIC ACID, PLASMA - Abnormal; Notable for the following components:      Result Value   Lactic Acid, Venous 2.1 (*)    All other components within normal limits  COMPREHENSIVE METABOLIC PANEL - Abnormal; Notable for the following components:   Sodium 149 (*)    Glucose, Bld 151 (*)    BUN 37 (*)    Creatinine, Ser 1.31 (*)    Calcium 8.0 (*)    Albumin 2.7 (*)    Total Bilirubin 1.3 (*)    GFR, Estimated 57 (*)    All other components within normal limits  CBC WITH DIFFERENTIAL/PLATELET - Abnormal; Notable for the following components:   Platelets 533 (*)    nRBC 0.4 (*)    Abs Immature Granulocytes 0.15 (*)    All other components within normal limits  BLOOD GAS, VENOUS - Abnormal; Notable for the following components:   pH, Ven 7.47 (*)    pO2, Ven <31.0 (*)    Bicarbonate 34.9 (*)    Acid-Base Excess 9.4 (*)    All other components within normal limits  CULTURE, BLOOD (ROUTINE X 2)  CULTURE, BLOOD (ROUTINE X 2)  URINE CULTURE  RESP PANEL BY RT-PCR (FLU A&B,  COVID) ARPGX2  PROTIME-INR  APTT  PROCALCITONIN  LACTIC ACID, PLASMA  URINALYSIS, COMPLETE (UACMP) WITH MICROSCOPIC  POC SARS CORONAVIRUS 2 AG -  ED    ____________________________________________  EKG: Normal sinus rhythm, VR 82. QRS 95, QTc 450. No acute St elevations or depressions. No ischemia or infarct. ________________________________________  RADIOLOGY All imaging, including plain films, CT scans, and ultrasounds, independently reviewed by me, and interpretations confirmed via formal radiology reads.  ED MD interpretation:   CXR: Multifocal PNA  Official radiology report(s): DG Chest Port 1 View  Result Date: 07/06/2020 CLINICAL DATA:  Possible sepsis. EXAM: PORTABLE CHEST 1 VIEW COMPARISON:  None. FINDINGS: Mild cardiomegaly. Diffuse bilateral airspace opacities, most likely acute, most likely pneumonia. No pleural effusion or pneumothorax is seen. Osseous structures about the chest are unremarkable. IMPRESSION: 1. Diffuse bilateral airspace opacities, most likely acute, most likely multifocal pneumonia. 2. Mild cardiomegaly. Electronically Signed   By: Franki Cabot M.D.   On: 07/06/2020 10:56    ____________________________________________  PROCEDURES   Procedure(s) performed (including Critical Care):  .Critical Care Performed by: Jeffrey Bruce, MD Authorized by: Jeffrey Bruce, MD   Critical care provider statement:    Critical care time (minutes):  35   Critical care time was exclusive of:  Separately billable procedures and treating other patients and teaching time   Critical care was necessary to treat or prevent imminent or life-threatening deterioration of the following conditions:  Cardiac failure, circulatory failure, respiratory failure and sepsis   Critical care was time spent personally by me on the following activities:  Development of treatment plan with patient or surrogate, discussions with consultants, evaluation of patient's response to  treatment, examination of patient, obtaining history from patient or surrogate, ordering and  performing treatments and interventions, ordering and review of laboratory studies, ordering and review of radiographic studies, pulse oximetry, re-evaluation of patient's condition and review of old charts   I assumed direction of critical care for this patient from another provider in my specialty: no   .1-3 Lead EKG Interpretation Performed by: Jeffrey Bruce, MD Authorized by: Jeffrey Bruce, MD     Interpretation: normal     ECG rate:  70-90   ECG rate assessment: normal     Rhythm: sinus rhythm     Ectopy: none     Conduction: normal   Comments:     Indication: SOB, weakness     ____________________________________________  INITIAL IMPRESSION / MDM / [place] / ED COURSE  As part of my medical decision making, I reviewed the following data within the [place] notes reviewed and incorporated, Old chart reviewed, Notes from prior ED visits, and [place] Controlled Substance Database       *DAIVIK OVERLEY was evaluated in Emergency Department on 07/06/2020 for the symptoms described in the history of present illness. He was evaluated in the context of the global COVID-19 pandemic, which necessitated consideration that the patient might be at risk for infection with the SARS-CoV-2 virus that causes COVID-19. Institutional protocols and algorithms that pertain to the evaluation of patients at risk for COVID-19 are in a state of rapid change based on information released by regulatory bodies including the CDC and federal and state organizations. These policies and algorithms were followed during the patient's care in the ED.  Some ED evaluations and interventions may be delayed as a result of limited staffing during the pandemic.*     Medical Decision Making:  75 yo M here with AMS, encephalopathy, acute hypoxia. Suspect acute hypoxic resp failure and metabolic  encephalopathy 2/2 CAP. DDx includes COVID-19, possible concomitant UTI. Pt is borderline febrile, hypoxic, tachypneic on arrival, activated as sepsis protocol. Gentle fluids given. No hypotension.  Labs show  likely AKI, and lactic acidosis c/w sepsis. CXR shows multifocal PNA. Procal 0.17. COVID neg, PCR is sent. Blood gas w/o retention. Will admit to medicine.  ____________________________________________  FINAL CLINICAL IMPRESSION(S) / ED DIAGNOSES  Final diagnoses:  Acute respiratory failure with hypoxemia (HCC)  Multifocal pneumonia     MEDICATIONS GIVEN DURING THIS VISIT:  Medications  metroNIDAZOLE (FLAGYL) IVPB 500 mg (has no administration in time range)  vancomycin (VANCOCIN) IVPB 1000 mg/200 mL premix (has no administration in time range)  vancomycin (VANCOCIN) IVPB 1000 mg/200 mL premix (has no administration in time range)  sodium chloride 0.9 % bolus 1,000 mL (1,000 mLs Intravenous New Bag/Given 07/06/20 1044)  aztreonam (AZACTAM) 2 g in sodium chloride 0.9 % 100 mL IVPB (2 g Intravenous New Bag/Given 07/06/20 1115)     ED Discharge Orders    None       Torres:  This document was prepared using Dragon voice recognition software and may include unintentional dictation errors.   Jeffrey Bruce, MD 07/06/20 [phone]

## 2020-07-07 DIAGNOSIS — G9341 Metabolic encephalopathy: Secondary | ICD-10-CM | POA: Diagnosis not present

## 2020-07-07 DIAGNOSIS — J9601 Acute respiratory failure with hypoxia: Secondary | ICD-10-CM | POA: Diagnosis not present

## 2020-07-07 LAB — URINALYSIS, COMPLETE (UACMP) WITH MICROSCOPIC
Bacteria, UA: NONE SEEN
Bilirubin Urine: NEGATIVE
Glucose, UA: NEGATIVE mg/dL
Ketones, ur: NEGATIVE mg/dL
Leukocytes,Ua: NEGATIVE
Nitrite: NEGATIVE
Protein, ur: 100 mg/dL — AB
Specific Gravity, Urine: 1.029 (ref 1.005–1.030)
pH: 6 (ref 5.0–8.0)

## 2020-07-07 LAB — CBC WITH DIFFERENTIAL/PLATELET
Abs Immature Granulocytes: 0.1 10*3/uL — ABNORMAL HIGH (ref 0.00–0.07)
Basophils Absolute: 0 10*3/uL (ref 0.0–0.1)
Basophils Relative: 0 %
Eosinophils Absolute: 0 10*3/uL (ref 0.0–0.5)
Eosinophils Relative: 0 %
HCT: 44.4 % (ref 39.0–52.0)
Hemoglobin: 14.4 g/dL (ref 13.0–17.0)
Immature Granulocytes: 2 %
Lymphocytes Relative: 10 %
Lymphs Abs: 0.7 10*3/uL (ref 0.7–4.0)
MCH: 30.1 pg (ref 26.0–34.0)
MCHC: 32.4 g/dL (ref 30.0–36.0)
MCV: 92.9 fL (ref 80.0–100.0)
Monocytes Absolute: 0.3 10*3/uL (ref 0.1–1.0)
Monocytes Relative: 4 %
Neutro Abs: 5.3 10*3/uL (ref 1.7–7.7)
Neutrophils Relative %: 84 %
Platelets: 524 10*3/uL — ABNORMAL HIGH (ref 150–400)
RBC: 4.78 MIL/uL (ref 4.22–5.81)
RDW: 13.7 % (ref 11.5–15.5)
WBC: 6.3 10*3/uL (ref 4.0–10.5)
nRBC: 0 % (ref 0.0–0.2)

## 2020-07-07 LAB — BASIC METABOLIC PANEL
Anion gap: 11 (ref 5–15)
BUN: 33 mg/dL — ABNORMAL HIGH (ref 8–23)
CO2: 25 mmol/L (ref 22–32)
Calcium: 7.3 mg/dL — ABNORMAL LOW (ref 8.9–10.3)
Chloride: 112 mmol/L — ABNORMAL HIGH (ref 98–111)
Creatinine, Ser: 1.03 mg/dL (ref 0.61–1.24)
GFR, Estimated: >60 mL/min (ref >60)
Glucose, Bld: 152 mg/dL — ABNORMAL HIGH (ref 70–99)
Potassium: 3.3 mmol/L — ABNORMAL LOW (ref 3.5–5.1)
Sodium: 148 mmol/L — ABNORMAL HIGH (ref 135–145)

## 2020-07-07 LAB — PROTIME-INR
INR: 1.3 — ABNORMAL HIGH (ref 0.8–1.2)
Prothrombin Time: 15.6 seconds — ABNORMAL HIGH (ref 11.4–15.2)

## 2020-07-07 LAB — STREP PNEUMONIAE URINARY ANTIGEN: Strep Pneumo Urinary Antigen: NEGATIVE

## 2020-07-07 LAB — D-DIMER, QUANTITATIVE: D-Dimer, Quant: 1.47 ug/mL-FEU — ABNORMAL HIGH (ref 0.00–0.50)

## 2020-07-07 LAB — PROCALCITONIN: Procalcitonin: 0.1 ng/mL

## 2020-07-07 LAB — C-REACTIVE PROTEIN: CRP: 12.2 mg/dL — ABNORMAL HIGH (ref <1.0)

## 2020-07-07 LAB — HIV ANTIBODY (ROUTINE TESTING W REFLEX): HIV Screen 4th Generation wRfx: NONREACTIVE

## 2020-07-07 LAB — LACTIC ACID, PLASMA: Lactic Acid, Venous: 1.7 mmol/L (ref 0.5–1.9)

## 2020-07-07 LAB — CORTISOL-AM, BLOOD: Cortisol - AM: 11.6 ug/dL (ref 6.7–22.6)

## 2020-07-07 MED ORDER — SODIUM CHLORIDE 0.9 % IV SOLN
200.0000 mg | Freq: Once | INTRAVENOUS | Status: AC
  Administered 2020-07-07: 200 mg via INTRAVENOUS
  Filled 2020-07-07: qty 200

## 2020-07-07 MED ORDER — SODIUM CHLORIDE 0.9 % IV SOLN: 100.0000 mg | Freq: Once | INTRAVENOUS | Status: DC

## 2020-07-07 MED ORDER — BARICITINIB 2 MG PO TABS
4.0000 mg | ORAL_TABLET | Freq: Every day | ORAL | Status: DC
  Administered 2020-07-09 – 2020-07-11 (×3): 4 mg via ORAL
  Filled 2020-07-07 (×5): qty 2

## 2020-07-07 MED ORDER — SODIUM CHLORIDE 0.9 % IV SOLN
100.0000 mg | Freq: Every day | INTRAVENOUS | Status: DC
  Administered 2020-07-08 – 2020-07-10 (×3): 100 mg via INTRAVENOUS
  Filled 2020-07-07 (×2): qty 20

## 2020-07-07 MED ORDER — METHYLPREDNISOLONE SODIUM SUCC 125 MG IJ SOLR
50.0000 mg | Freq: Two times a day (BID) | INTRAMUSCULAR | Status: DC
  Administered 2020-07-07 – 2020-07-10 (×7): 50 mg via INTRAVENOUS
  Filled 2020-07-07 (×8): qty 2

## 2020-07-07 MED ORDER — VANCOMYCIN HCL 1500 MG/300ML IV SOLN
1500.0000 mg | INTRAVENOUS | Status: DC
  Administered 2020-07-07 – 2020-07-08 (×2): 1500 mg via INTRAVENOUS
  Filled 2020-07-07 (×3): qty 300

## 2020-07-07 NOTE — ED Notes (Signed)
Pt lying in bed quiet and calm at this time; oriented to person, place (can state he is in hospital) and able to identify [place] as daughter who works at [employer].  Safety attendant remains at bedside.  Will continue to monitor patient for acute changes and maintain plan of care as pt awaits bed assignment.

## 2020-07-07 NOTE — ED Notes (Signed)
Pt remains in bed. Meds finished so IVs stopped and mittens remain in place.

## 2020-07-07 NOTE — ED Notes (Signed)
Pt wont keep gown on. Says he is taking it off to get ready for bed. He is covered with a blanket. I encouraged him to close his eyes and get some rest. He also had a few more sips of ice water at his request.

## 2020-07-07 NOTE — ED Notes (Signed)
Pt remains confused, had removed O2 [place] and had O2 of 78% on RA. O2 replaced, pt redirected. Lights dimmed and TV on for distraction and pt redirected. Pt resting at this time. O2 improved to 92%.

## 2020-07-07 NOTE — ED Notes (Signed)
This tech is sitting with Pt.

## 2020-07-07 NOTE — ED Notes (Addendum)
This nurse called to room by ED staff nurse that noted patient bed alarm sounding -- pt found sitting at foot of bed -- one mitt worn on L hand and R hand mitt on the floor- pt HFNC out and cardiac monitor leads off.  Pt assisted back into bed in supine position and leads replaced - pt back on HFNC -O2 sats 92%; no signs of acute distress.  Bed alarm set

## 2020-07-07 NOTE — ED Notes (Signed)
Pt keeps dozing off but does not stay asleep.

## 2020-07-07 NOTE — ED Notes (Signed)
Charge nurse made aware of patient location/status - high falls risk; safety attendant to be assigned to sit with patient for safety

## 2020-07-07 NOTE — ED Notes (Signed)
Pt voided 124mls in urinal. I then put a male purewick on pt. Pt took a couple sips of water. Approved by RN

## 2020-07-07 NOTE — ED Notes (Signed)
Report given to nurse.

## 2020-07-07 NOTE — ED Notes (Signed)
Safety attendant at bedside.

## 2020-07-07 NOTE — Progress Notes (Signed)
PROGRESS NOTE  Jeffrey Torres NUU:[id number] DOB: [birth year] DOA: 07/06/2020 PCP: Birdie Sons, MD   LOS: 1 day   Brief Narrative / Interim history: 75 year old male at baseline independent, living by himself, presented to the hospital with confusion and progressive decline since Christmas.  At that time he was complaining of feeling unwell with cough, upper respiratory symptoms, did not seek care.  5 days ago he was having worsening mentation, decreased oral intake and had a fall at home.  Given progressive weakness and confusion family brought him to the emergency room.  He was found to be hypoxic, chest x-ray showed multifocal pneumonia, tested positive for Covid and he was admitted to the hospital  Subjective / 24h Interval events: Patient is confused, has mittens on, he does not know what is happening.  Says that he does not feel well but cannot elaborate  Assessment & Plan:  Principal Problem Acute Hypoxic Respiratory Failure due to Covid-19 Viral Illness -Patient started on steroids, Remdesivir, baricitinib, continue -Very poor prognosis given the fact that he was at home for 2 weeks with progressive symptoms -Patient also started on Levaquin, plan for no more than 3 days given relatively low procalcitonin   COVID-19 Labs  No results for input(s): DDIMER, FERRITIN, LDH, CRP in the last 72 hours.  Lab Results  Component Value Date   SARSCOV2NAA POSITIVE (A) 07/06/2020   [place] NEGATIVE 03/08/2020   [place] NEGATIVE 08/04/2019   [place] NEGATIVE 07/14/2019    Active Problems Acute metabolic encephalopathy -Due to infection, treat underlying process.  Per family he has no history of dementia and living independently  Acute kidney injury -Due to poor p.o. intake and dehydration, creatinine improved with fluids.  Hold further fluids  History of squamous cell cancer of the larynx -Outpatient management  Essential hypertension -He is normotensive now, on  Cardizem  History of bigeminy -Monitor on telemetry, continue Cardizem   Scheduled Meds: . aspirin  81 mg Oral Daily  . diltiazem  120 mg Oral Daily  . enoxaparin (LOVENOX) injection  50 mg Subcutaneous Q24H  . fluticasone  2 spray Each Nare [id number]  . guaiFENesin  600 mg Oral BID  . methylPREDNISolone (SOLU-MEDROL) injection  50 mg Intravenous Q12H  . metoprolol tartrate  5 mg Intravenous Q6H  . pantoprazole  40 mg Oral Daily   Continuous Infusions: . lactated ringers     And  . lactated ringers    . levofloxacin (LEVAQUIN) IV Stopped (07/06/20 1844)  . vancomycin    . vancomycin     PRN Meds:.albuterol  DVT prophylaxis: Lovenox Code Status: Full code Family Communication: Updated daughter Hal Hope [phone] over the phone   Status is: Inpatient  Remains inpatient appropriate because:Inpatient level of care appropriate due to severity of illness   Dispo: The patient is from: Home              Anticipated d/c is to: Home              Anticipated d/c date is: > 3 days              Patient currently is not medically stable to d/c.  Consultants:  None   Procedures:  None   Microbiology: None   Antibacterials: Levaquin 1/9, today day 2/3   Objective: Vitals:   07/07/20 0400 07/07/20 0600 07/07/20 0713 07/07/20 0928  BP: 120/80 100/62 108/74 (!) 135/94  Pulse: 80 82 85 (!) 103  Resp: 17 16 18 20   Temp:  TempSrc:      SpO2: 92% 93% 94% 92%  Weight:      Height:        Intake/Output Summary (Last 24 hours) at 07/07/2020 0937 Last data filed at 07/07/2020 5681 Gross per 24 hour  Intake 3943.33 ml  Output --  Net 3943.33 ml   Filed Weights   07/06/20 1012  Weight: 100 kg    Examination:  Constitutional: confused, mittens on Eyes: no scleral icterus ENMT: Mucous membranes are moist.  Neck: normal, supple Respiratory: rhonchi bilaterally, no wheezing, no crackles. tachypneic  Cardiovascular: Regular rate and rhythm, no murmurs / rubs /  gallops. No LE edema. Abdomen: non distended, no tenderness. Bowel sounds positive.  Musculoskeletal: no clubbing / cyanosis.  Skin: no rashes Neurologic: non focal  Psychiatric: Alert to self  Data Reviewed: I have independently reviewed following labs and imaging studies   CBC: Recent Labs  Lab 07/06/20 1022 07/07/20 0353  WBC 7.7 6.3  NEUTROABS 6.4 5.3  HGB 15.8 14.4  HCT 48.1 44.4  MCV 93.2 92.9  PLT 533* 275*   Basic Metabolic Panel: Recent Labs  Lab 07/06/20 1022 07/07/20 0353  NA 149* 148*  K 3.6 3.3*  CL 104 112*  CO2 30 25  GLUCOSE 151* 152*  BUN 37* 33*  CREATININE 1.31* 1.03  CALCIUM 8.0* 7.3*   GFR: Estimated Creatinine Clearance: 75.7 mL/min (by C-G formula based on SCr of 1.03 mg/dL). Liver Function Tests: Recent Labs  Lab 07/06/20 1022  AST 38  ALT 33  ALKPHOS 45  BILITOT 1.3*  PROT 7.9  ALBUMIN 2.7*   No results for input(s): LIPASE, AMYLASE in the last 168 hours. No results for input(s): AMMONIA in the last 168 hours. Coagulation Profile: Recent Labs  Lab 07/06/20 1022 07/07/20 0353  INR 1.2 1.3*   Cardiac Enzymes: No results for input(s): CKTOTAL, CKMB, CKMBINDEX, TROPONINI in the last 168 hours. BNP (last 3 results) No results for input(s): PROBNP in the last 8760 hours. HbA1C: No results for input(s): HGBA1C in the last 72 hours. CBG: No results for input(s): GLUCAP in the last 168 hours. Lipid Profile: No results for input(s): CHOL, HDL, LDLCALC, TRIG, CHOLHDL, LDLDIRECT in the last 72 hours. Thyroid Function Tests: No results for input(s): TSH, T4TOTAL, FREET4, T3FREE, THYROIDAB in the last 72 hours. Anemia Panel: No results for input(s): VITAMINB12, FOLATE, FERRITIN, TIBC, IRON, RETICCTPCT in the last 72 hours. Urine analysis:    Component Value Date/Time   COLORURINE AMBER (A) 07/07/2020 0353   APPEARANCEUR HAZY (A) 07/07/2020 0353   LABSPEC 1.029 07/07/2020 0353   PHURINE 6.0 07/07/2020 0353   GLUCOSEU NEGATIVE  07/07/2020 0353   HGBUR MODERATE (A) 07/07/2020 0353   BILIRUBINUR NEGATIVE 07/07/2020 0353   KETONESUR NEGATIVE 07/07/2020 0353   PROTEINUR 100 (A) 07/07/2020 0353   NITRITE NEGATIVE 07/07/2020 0353   LEUKOCYTESUR NEGATIVE 07/07/2020 0353   Sepsis Labs: Invalid input(s): PROCALCITONIN, LACTICIDVEN  Recent Results (from the past 240 hour(s))  Blood Culture (routine x 2)     Status: None (Preliminary result)   Collection Time: 07/06/20 10:22 AM   Specimen: BLOOD  Result Value Ref Range Status   Specimen Description BLOOD RIGHT [facility]  Final   Special Requests   Final    BOTTLES DRAWN AEROBIC AND ANAEROBIC Blood Culture adequate volume   Culture   Final    NO GROWTH < 24 HOURS Performed at [facility], [address] [place], [place] [address]    Report  Status PENDING  Incomplete  Blood Culture (routine x 2)     Status: None (Preliminary result)   Collection Time: 07/06/20 10:22 AM   Specimen: BLOOD  Result Value Ref Range Status   Specimen Description BLOOD LAC  Final   Special Requests   Final    BOTTLES DRAWN AEROBIC AND ANAEROBIC Blood Culture adequate volume   Culture   Final    NO GROWTH < 24 HOURS Performed at [facility], [address], [place], [place] [address]    Report Status PENDING  Incomplete  Resp Panel by RT-PCR (Flu A&B, Covid) Nasopharyngeal Swab     Status: Abnormal   Collection Time: 07/06/20 10:22 AM   Specimen: Nasopharyngeal Swab; Nasopharyngeal(NP) swabs in vial transport medium  Result Value Ref Range Status   SARS Coronavirus 2 by RT PCR POSITIVE (A) NEGATIVE Final    Comment: RESULT CALLED TO, READ BACK BY AND VERIFIED WITH: CHRISTINE HALLAS 07/06/20 @1400  BY ACR (NOTE) SARS-CoV-2 target nucleic acids are DETECTED.  The SARS-CoV-2 RNA is generally detectable in upper respiratory specimens during the acute phase of infection. Positive results are indicative of the presence of the identified virus, but do not rule out  bacterial infection or co-infection with other pathogens not detected by the test. Clinical correlation with patient history and other diagnostic information is necessary to determine patient infection status. The expected result is Negative.  Fact Sheet for Patients: EntrepreneurPulse.com.au  Fact Sheet for Healthcare Providers: IncredibleEmployment.be  This test is not yet approved or cleared by the [place] FDA and  has been authorized for detection and/or diagnosis of SARS-CoV-2 by FDA under an Emergency Use Authorization (EUA).  This EUA will remain in effect (meaning this test can  be used) for the duration of  the COVID-19 declaration under Section 564(b)(1) of the Act, 21 U.S.C. section 360bbb-3(b)(1), unless the authorization is terminated or revoked sooner.     Influenza A by PCR NEGATIVE NEGATIVE Final   Influenza B by PCR NEGATIVE NEGATIVE Final    Comment: (NOTE) The Xpert Xpress SARS-CoV-2/FLU/RSV plus assay is intended as an aid in the diagnosis of influenza from Nasopharyngeal swab specimens and should not be used as a sole basis for treatment. Nasal washings and aspirates are unacceptable for Xpert Xpress SARS-CoV-2/FLU/RSV testing.  Fact Sheet for Patients: EntrepreneurPulse.com.au  Fact Sheet for Healthcare Providers: IncredibleEmployment.be  This test is not yet approved or cleared by the [place] FDA and has been authorized for detection and/or diagnosis of SARS-CoV-2 by FDA under an Emergency Use Authorization (EUA). This EUA will remain in effect (meaning this test can be used) for the duration of the COVID-19 declaration under Section 564(b)(1) of the Act, 21 U.S.C. section 360bbb-3(b)(1), unless the authorization is terminated or revoked.  Performed at [facility], [address] [place],  [address]       Radiology Studies: CT Head Wo  Contrast  Result Date: 07/06/2020 CLINICAL DATA:  Mental status change. EXAM: CT HEAD WITHOUT CONTRAST TECHNIQUE: Contiguous axial images were obtained from the base of the skull through the vertex without intravenous contrast. COMPARISON:  None. FINDINGS: Brain: No evidence of acute infarction, hemorrhage, hydrocephalus, extra-axial collection or mass lesion/mass effect. Vascular: No hyperdense vessel or unexpected calcification. Skull: Normal. Negative for fracture or focal lesion. Sinuses/Orbits: There is a small amount of fluid posteriorly in the sphenoid sinuses. Opacification of scattered ethmoid air cells identified. Other: None. IMPRESSION: 1. No acute intracranial abnormality. 2. Sinus disease as above.  Electronically Signed   By: Dorise Bullion III M.D   On: 07/06/2020 12:38   DG Chest Port 1 View  Result Date: 07/06/2020 CLINICAL DATA:  Possible sepsis. EXAM: PORTABLE CHEST 1 VIEW COMPARISON:  None. FINDINGS: Mild cardiomegaly. Diffuse bilateral airspace opacities, most likely acute, most likely pneumonia. No pleural effusion or pneumothorax is seen. Osseous structures about the chest are unremarkable. IMPRESSION: 1. Diffuse bilateral airspace opacities, most likely acute, most likely multifocal pneumonia. 2. Mild cardiomegaly. Electronically Signed   By: Franki Cabot M.D.   On: 07/06/2020 10:56     Marzetta Board, MD, PhD [facility]  Between 7 am - 7 pm I am available, please contact me via Amion or Securechat  Between 7 pm - 7 am I am not available, please contact night coverage MD/APP via Amion

## 2020-07-07 NOTE — Progress Notes (Signed)
Remdesivir - Pharmacy Brief Note   O:  ALT: 33 CXR: Diffuse bilateral airspace opacities, most likely acute, most likely multifocal pneumonia. SpO2: 92-98 % on 10L [place]    A/P:  Remdesivir 200 mg IVPB once followed by 100 mg IVPB daily x 4 days.   Pernell Dupre, PharmD, BCPS Clinical Pharmacist 07/07/2020 10:19 AM

## 2020-07-07 NOTE — Progress Notes (Signed)
Pharmacy Antibiotic Note  Jeffrey Torres is a 75 y.o. male admitted on 07/06/2020 with pneumonia.  Pharmacy has been consulted for levofloxacin and vancomycin dosing.  Pt given aztreonam 2g IV x1, Flagyl 500 mg IV x1, and ordered total of vancomycin 2g loading dose (doses in progress) in the ED.   Plan: Levofloxacin 750 mg IV q24h  Pt received vancomycin 2000 mg total on 1/8. Scr improved will change vancomycin 1250 mg IV Q 24 hrs to vancomycin 1500 mg q24H for a predicted AUC of 507. Css min 10.5. Goal AUC is 400-550. Plan to obtain levels in the next 4-5 days. MRSA PCR pending.     Height: 5\' 10"  (177.8 cm) Weight: 100 kg (220 lb 7.4 oz) IBW/kg (Calculated) : 73  No data recorded.  Recent Labs  Lab 07/06/20 1022 07/07/20 0353  WBC 7.7 6.3  CREATININE 1.31* 1.03  LATICACIDVEN 2.1* 1.7    Estimated Creatinine Clearance: 75.7 mL/min (by C-G formula based on SCr of 1.03 mg/dL).    Allergies  Allergen Reactions  . Penicillins Anaphylaxis and Swelling    Did it involve swelling of the face/tongue/throat, SOB, or low BP? Yes Did it involve sudden or severe rash/hives, skin peeling, or any reaction on the inside of your mouth or nose? No Did you need to seek medical attention at a hospital or doctor's office? No When did it last happen?10 + years ago If all above answers are "NO", may proceed with cephalosporin use.     Antimicrobials this admission: Aztreonam, Flagyl 1/8 x1 Levaquin 1/8 >> Vancomycin 1/8 >>  Dose adjustments this admission:   Microbiology results: 1/8 BCx: collected 1/8 UCx: ordered  1/8 Sputum: ordered  1/8 MRSA PCR: ordered  Thank you for allowing pharmacy to be a part of this patient's care.  Oswald Hillock 07/07/2020 11:35 AM

## 2020-07-08 ENCOUNTER — Encounter: Payer: Self-pay | Admitting: Internal Medicine

## 2020-07-08 DIAGNOSIS — G9341 Metabolic encephalopathy: Secondary | ICD-10-CM | POA: Diagnosis not present

## 2020-07-08 DIAGNOSIS — J9601 Acute respiratory failure with hypoxia: Secondary | ICD-10-CM | POA: Diagnosis not present

## 2020-07-08 LAB — CBC
HCT: 51.8 % (ref 39.0–52.0)
Hemoglobin: 16.9 g/dL (ref 13.0–17.0)
MCH: 30 pg (ref 26.0–34.0)
MCHC: 32.6 g/dL (ref 30.0–36.0)
MCV: 91.8 fL (ref 80.0–100.0)
Platelets: 607 10*3/uL — ABNORMAL HIGH (ref 150–400)
RBC: 5.64 MIL/uL (ref 4.22–5.81)
RDW: 13.8 % (ref 11.5–15.5)
WBC: 8.2 10*3/uL (ref 4.0–10.5)
nRBC: 0 % (ref 0.0–0.2)

## 2020-07-08 LAB — COMPREHENSIVE METABOLIC PANEL
ALT: 31 U/L (ref 0–44)
AST: 41 U/L (ref 15–41)
Albumin: 2.4 g/dL — ABNORMAL LOW (ref 3.5–5.0)
Alkaline Phosphatase: 35 U/L — ABNORMAL LOW (ref 38–126)
Anion gap: 14 (ref 5–15)
BUN: 42 mg/dL — ABNORMAL HIGH (ref 8–23)
CO2: 24 mmol/L (ref 22–32)
Calcium: 7.8 mg/dL — ABNORMAL LOW (ref 8.9–10.3)
Chloride: 112 mmol/L — ABNORMAL HIGH (ref 98–111)
Creatinine, Ser: 1.2 mg/dL (ref 0.61–1.24)
GFR, Estimated: >60 mL/min (ref >60)
Glucose, Bld: 172 mg/dL — ABNORMAL HIGH (ref 70–99)
Potassium: 3.3 mmol/L — ABNORMAL LOW (ref 3.5–5.1)
Sodium: 150 mmol/L — ABNORMAL HIGH (ref 135–145)
Total Bilirubin: 1 mg/dL (ref 0.3–1.2)
Total Protein: 7 g/dL (ref 6.5–8.1)

## 2020-07-08 LAB — C-REACTIVE PROTEIN: CRP: 7.9 mg/dL — ABNORMAL HIGH (ref <1.0)

## 2020-07-08 LAB — URINE CULTURE: Culture: NO GROWTH

## 2020-07-08 LAB — MRSA PCR SCREENING: MRSA by PCR: NEGATIVE

## 2020-07-08 LAB — MAGNESIUM: Magnesium: 3 mg/dL — ABNORMAL HIGH (ref 1.7–2.4)

## 2020-07-08 LAB — D-DIMER, QUANTITATIVE: D-Dimer, Quant: 1.16 ug/mL-FEU — ABNORMAL HIGH (ref 0.00–0.50)

## 2020-07-08 MED ORDER — DEXTROSE 5 % IV SOLN: INTRAVENOUS | Status: DC

## 2020-07-08 MED ORDER — HALOPERIDOL LACTATE 5 MG/ML IJ SOLN: 5.0000 mg | Freq: Four times a day (QID) | INTRAMUSCULAR | Status: DC | PRN

## 2020-07-08 NOTE — ED Notes (Signed)
RN helped me pull pt up in the bed and get him back into a comfortable position.

## 2020-07-08 NOTE — Evaluation (Signed)
Clinical/Bedside Swallow Evaluation Patient Details  Name: Jeffrey Torres MRN: [id number] Date of Birth: 06/28/1946  Today's Date: 07/08/2020 Time: SLP Start Time (ACUTE ONLY): 1100 SLP Stop Time (ACUTE ONLY): 1200 SLP Time Calculation (min) (ACUTE ONLY): 60 min  Past Medical History:  Past Medical History:  Diagnosis Date  . Arthritis   . Cancer [facility])    larynx ca, surgery and radiation  . GERD (gastroesophageal reflux disease)   . Heart murmur   . Hernia, inguinal, bilateral   . History of chicken pox   . History of kidney stones   . History of measles   . History of mumps   . History of shingles   . HOH (hard of hearing)   . Macular degeneration of both eyes   . Motion sickness    ocean boat  . PVC (premature ventricular contraction)    Past Surgical History:  Past Surgical History:  Procedure Laterality Date  . CARDIAC CATHETERIZATION  2010   normal per patient report  . CATARACT EXTRACTION W/PHACO Right 07/18/2019   Procedure: CATARACT EXTRACTION PHACO AND INTRAOCULAR LENS PLACEMENT (IOC) RIGHT 4.54 00:29.7;  Surgeon: Birder Robson, MD;  Location: [place];  Service: Ophthalmology;  Laterality: Right;  . CATARACT EXTRACTION W/PHACO Left 08/08/2019   Procedure: CATARACT EXTRACTION PHACO AND INTRAOCULAR LENS PLACEMENT ([place]) LEFT;  Surgeon: Birder Robson, MD;  Location: [facility] ORS;  Service: Ophthalmology;  Laterality: Left;  Lot #[id number] H [place]: 00:34.5 CDE: 4.75  . COLONOSCOPY  06/24/2006   Dr. Bary Castilla. Multiple benign appearing 55mm polyps in the cecum and in the rectum. -Three 66mm polyps in the transversed colon, in the tranverse colon, proximal and in the distal transverse colon. Resected and retrieved.  . COLONOSCOPY WITH PROPOFOL N/A 03/12/2020   Procedure: COLONOSCOPY WITH PROPOFOL;  Surgeon: Lucilla Lame, MD;  Location: [facility] ENDOSCOPY;  Service: Endoscopy;  Laterality: N/A;  . CYSTOSCOPY WITH HOLMIUM LASER LITHOTRIPSY  2001   kidney stones removed, [facility]   . LARYNGOSCOPY Right 04/01/2017   Procedure: SUSPENSION LARYNGOSCOPY WITH MICROFLAP EXCISION;  Surgeon: Carloyn Manner, MD;  Location: [facility] ORS;  Service: ENT;  Laterality: Right;  . sinus surgery   2002   [place]; removal of polyps  . TONSILLECTOMY  1954  . Tubular adenoma removed  06/24/2006   HPI:  Pt is a 75 year old Caucasian male who lives by himself, IADL and ADL independent.  Patient is unvaccinated for Covid.  He had been in his usual state of health until Christmas Eve when he had complained of feeling unwell with cough and URI symptoms.  Patient did not seek any medical attention at the time.  As per daughter,since 5 days ago, patient was noted to have worsening altered mental status with poor oral intake.  Family able to monitor patient's activities using the camera.  Patient apparently had reported to family members that he had a fall in his bathtub within the past few days.  Fall were unwitnessed.  No known seizures.  Today, patient was noted to be significantly weak and altered in mental status hence family decided to bring patient into the emergency room.  Patient was reported to be hypoxic on presentation requiring nonrebreather mask.  He is currently being weaned down to nasal cannula.  Initial Covid test came back negative on the antigen test.  Chest x-ray however concerning for multifocal pneumonia.  PMH: includes GERD, Cancer - larynx ca, surgery and radiation(per Soft Tissue of Neck in 2018: Small area of soft tissue thickening enhancement  right anterior vocal cord compatible with known neoplasm. No adenopathy in the  neck), HOH, Macular degeneration of both eyes.   Assessment / Plan / Recommendation Clinical Impression  Pt appears to present w/ grossly adequate pharyngeal phase swallow function w/ No immediate pharyngeal phase dysphagia noted, No overt neuromuscular deficits noted. Pt consumed po trials w/ No immediate, overt clinical s/s of aspiration during po trials, no  decline in O2 sats(94-95% throughout). However, pt exhibits oral prep/phase confusion resulting in oral phase dysphagia -- any oral phase deficits can impact the pharyngeal phase of swallowing. Unknown Cognitive status baseline d/t no family present at this eval. Pt has also a h/o "Cancer - larynx ca, surgery and radiation(per Soft Tissue of Neck in 2018: Small area of soft tissue thickening enhancement right anterior vocal cord compatible with known neoplasm".  Pt appears at reduced risk for aspiration w/ a modified food consistency diet and when following general aspiration precautions including NO STRAWS. During po trials, pt consumed all consistencies w/ no immediate, overt coughing, decline in vocal quality, or change in respiratory presentation during/post trials. Pt exhbiited a delayed, mild throat clearing x2 in between trials given -- he often talked frequently even immediately following swallows. Verbal cues given to redirect pt to tasks(Cognitive distraction). Educatoin given on NOT talking as much during eating/drinking. Oral phase c/b increased time for bolus management, mastication, and propulsion for A-P transfer for swallowing. More of a Munching pattern noted w/ less rotary chewing. Pt was given verbal cues to attend to, and completely clear, his mouth b/f taking sips of liquids following. Oral clearing achieved w/ all trial consistencies. OM Exam appeared grossly [facility] w/ no unilateral weakness noted. Tongue strength for posterior and protrusion adequate. Speech Clear. Pt fed self w/ setup support and removing R Mitt.  Recommend a Pureed consistency diet w/ gravies to moisten foods; Thin liquids VIA CUP - pt does not use straws at home. Recommend general aspiration precautions, Pills Crushed in Puree for safer, easier swallowing right now. Education given to [place] on Pills in Puree; food consistencies and easy to eat options; general aspiration precautions; need for pt to Hold Cup to drink himself.  Suggested pt have full tray setup and positioning and limit number of items on tray at one time, but allow him to self for increased Cognitive input. Sitter present to monitor behavior d/t Confusion. NNSG agreed. ST services will f/u next 1-2 days. SLP Visit Diagnosis: Dysphagia, oropharyngeal phase (R13.12)    Aspiration Risk  Mild aspiration risk;Risk for inadequate nutrition/hydration    Diet Recommendation  Pureed consistency diet w/ Thin liquids VIA CUP ONLY.  General aspiration precautions; Reflux precautions. Setup at meals and support d/t Cognitive confusion currently.   Medication Administration: Crushed with puree (for safer swallowing)    Other  Recommendations Recommended Consults:  (Dietician f/u) Oral Care Recommendations: Oral care BID;Oral care before and after PO;Staff/trained caregiver to provide oral care Other Recommendations:  (n/a currently)   Follow up Recommendations None (TBD)      Frequency and Duration min 3x week  2 weeks       Prognosis Prognosis for Safe Diet Advancement: Fair (-Good) Barriers to Reach Goals: Cognitive deficits;Severity of deficits;Behavior      Swallow Study   General Date of Onset: 07/06/20 HPI: Pt is a 75 year old Caucasian male who lives by himself, IADL and ADL independent.  Patient is unvaccinated for Covid.  He had been in his usual state of health until Christmas Eve when he  had complained of feeling unwell with cough and URI symptoms.  Patient did not seek any medical attention at the time.  As per daughter,since 5 days ago, patient was noted to have worsening altered mental status with poor oral intake.  Family able to monitor patient's activities using the camera.  Patient apparently had reported to family members that he had a fall in his bathtub within the past few days.  Fall were unwitnessed.  No known seizures.  Today, patient was noted to be significantly weak and altered in mental status hence family decided to bring  patient into the emergency room.  Patient was reported to be hypoxic on presentation requiring nonrebreather mask.  He is currently being weaned down to nasal cannula.  Initial Covid test came back negative on the antigen test.  Chest x-ray however concerning for multifocal pneumonia.  PMH: includes GERD, Cancer - larynx ca, surgery and radiation(per Soft Tissue of Neck in 2018: Small area of soft tissue thickening enhancement right anterior  vocal cord compatible with known neoplasm. No adenopathy in the  neck), HOH, Macular degeneration of both eyes. Type of Study: Bedside Swallow Evaluation Previous Swallow Assessment: none noted Diet Prior to this Study: Regular;Thin liquids Temperature Spikes Noted: No (wbc 8.2) Respiratory Status: Nasal cannula (10L) History of Recent Intubation: No Behavior/Cognition: Alert;Cooperative;Pleasant mood;Confused;Distractible;Requires cueing (redirectable) Oral Cavity Assessment: Dry (sticky) Oral Care Completed by SLP: Yes (attempted) Oral Cavity - Dentition: Adequate natural dentition Vision: Functional for self-feeding Self-Feeding Abilities: Needs assist;Needs set up;Able to feed self Patient Positioning: Upright in bed (needed positioning) Baseline Vocal Quality: Normal Volitional Cough: Strong Volitional Swallow: Able to elicit    Oral/Motor/Sensory Function Overall Oral Motor/Sensory Function: Within functional limits   Ice Chips Ice chips: Within functional limits Presentation: Spoon (fed; 5 trials)   Thin Liquid Thin Liquid: Within functional limits (grossly) Presentation: Cup;Self Fed (12+ trials) Other Comments: mild throat clearing in between trials x2 w/ all drinking    Nectar Thick Nectar Thick Liquid: Not tested   Honey Thick Honey Thick Liquid: Not tested   Puree Puree: Within functional limits Presentation: Self Fed;Spoon (10 trials) Other Comments: limited mouth opening in the beginning - min confusion?   Solid     Solid:  Impaired Presentation: Spoon (fed' 6 trials) Oral Phase Impairments: Impaired mastication;Reduced lingual movement/coordination;Poor awareness of bolus (suspect Cognitive impact?) Oral Phase Functional Implications: Prolonged oral transit;Impaired mastication;Oral holding Pharyngeal Phase Impairments:  (none)       [name], MS, [employer] Speech Language Pathologist Rehab Services [phone] Jeffrey Torres 07/08/2020,1:26 PM

## 2020-07-08 NOTE — ED Notes (Signed)
Pt ate about 30% of soft dinner tray. Per the help of tech feeding him. Tech would ask pt to help hold his drink cup and do it himself. Pt would drink with the left side of his mouth only. Pt was very shaky and unable to do much on his own. Pt still being monitored through tele sitter cam.

## 2020-07-08 NOTE — Progress Notes (Signed)
PROGRESS NOTE  Jeffrey Torres UVO:[id number] DOB: 02/17/1946 DOA: 07/06/2020 PCP: Birdie Sons, MD   LOS: 2 days   Brief Narrative / Interim history: 75 year old male at baseline independent, living by himself, presented to the hospital with confusion and progressive decline since Christmas.  At that time he was complaining of feeling unwell with cough, upper respiratory symptoms, did not seek care.  5 days ago he was having worsening mentation, decreased oral intake and had a fall at home.  Given progressive weakness and confusion family brought him to the emergency room.  He was found to be hypoxic, chest x-ray showed multifocal pneumonia, tested positive for Covid and he was admitted to the hospital  Subjective / 24h Interval events: He remains confused this morning, tells me that they are trying to steal his home and he wants to go home.  He cannot really tell me whether he is short of breath  Assessment & Plan:  Principal Problem Acute Hypoxic Respiratory Failure due to Covid-19 Viral Illness -Patient started on steroids, Remdesivir, baricitinib, continue.  Remains on 10 L this morning -Guarded prognosis given the fact that he was at home for 2 weeks with progressive symptoms -Patient also started on Levaquin, plan for no more than 3 days given relatively low procalcitonin   COVID-19 Labs  Recent Labs    07/07/20 1037  DDIMER 1.47*  CRP 12.2*    Lab Results  Component Value Date   SARSCOV2NAA POSITIVE (A) 07/06/2020   [place] NEGATIVE 03/08/2020   [place] NEGATIVE 08/04/2019   [place] NEGATIVE 07/14/2019    Active Problems Acute metabolic encephalopathy -Due to infection, treat underlying process.  Per family he has no history of dementia and living independently  Hypernatremia -Due to dehydration and poor p.o. intake, start dextrose water and monitor sodium levels  Acute kidney injury -Due to poor p.o. intake and dehydration, creatinine improved with  fluids.  Still has poor p.o. intake in the hospital, started on IV fluids today given creatinine climbing back up again  History of squamous cell cancer of the larynx -Outpatient management  Essential hypertension -He is normotensive now, on Cardizem  History of bigeminy -Monitor on telemetry, continue Cardizem   Scheduled Meds: . aspirin  81 mg Oral Daily  . baricitinib  4 mg Oral Daily  . diltiazem  120 mg Oral Daily  . enoxaparin (LOVENOX) injection  50 mg Subcutaneous Q24H  . fluticasone  2 spray Each Nare [id number]  . guaiFENesin  600 mg Oral BID  . methylPREDNISolone (SOLU-MEDROL) injection  50 mg Intravenous Q12H  . metoprolol tartrate  5 mg Intravenous Q6H  . pantoprazole  40 mg Oral Daily   Continuous Infusions: . dextrose 50 mL/hr at 07/08/20 0644  . lactated ringers     And  . lactated ringers    . levofloxacin (LEVAQUIN) IV Stopped (07/07/20 1626)  . remdesivir 100 mg in NS 100 mL    . vancomycin Stopped (07/07/20 1626)   PRN Meds:.albuterol  DVT prophylaxis: Lovenox Code Status: Full code Family Communication: Updated daughter Hal Hope [phone] over the phone  Status is: Inpatient  Remains inpatient appropriate because:Inpatient level of care appropriate due to severity of illness   Dispo: The patient is from: Home              Anticipated d/c is to: Home              Anticipated d/c date is: > 3 days  Patient currently is not medically stable to d/c.  Consultants:  None   Procedures:  None   Microbiology: None   Antibacterials: Levaquin 1/9, today day 3/3 and will discontinue after today's dose  Objective: Vitals:   07/08/20 0100 07/08/20 0400 07/08/20 0700 07/08/20 0715  BP: (!) 152/99 (!) 163/88 134/83   Pulse: (!) 46 85 85 95  Resp: (!) 24 (!) 30 19 (!) 27  Temp:  97.7 F (36.5 C)    TempSrc:  Axillary    SpO2: 96% 93% 93% 93%  Weight:      Height:        Intake/Output Summary (Last 24 hours) at 07/08/2020  3825 Last data filed at 07/08/2020 0137 Gross per 24 hour  Intake --  Output 200 ml  Net -200 ml   Filed Weights   07/06/20 1012  Weight: 100 kg    Examination:  Constitutional: Confused, mittens are on Eyes: No scleral icterus ENMT: mmm Neck: normal, supple Respiratory: Bibasilar rhonchi, no wheezing, no crackles Cardiovascular: Regular rate and rhythm, no murmurs, no peripheral edema Abdomen: Soft, nontender, nondistended, bowel sounds positive Musculoskeletal: no clubbing / cyanosis.  Skin: No rashes seen Neurologic: No focal deficits Psychiatric: Alert to self  Data Reviewed: I have independently reviewed following labs and imaging studies   CBC: Recent Labs  Lab 07/06/20 1022 07/07/20 0353 07/08/20 0355  WBC 7.7 6.3 8.2  NEUTROABS 6.4 5.3  --   HGB 15.8 14.4 16.9  HCT 48.1 44.4 51.8  MCV 93.2 92.9 91.8  PLT 533* 524* 053*   Basic Metabolic Panel: Recent Labs  Lab 07/06/20 1022 07/07/20 0353 07/08/20 0355  NA 149* 148* 150*  K 3.6 3.3* 3.3*  CL 104 112* 112*  CO2 30 25 24   GLUCOSE 151* 152* 172*  BUN 37* 33* 42*  CREATININE 1.31* 1.03 1.20  CALCIUM 8.0* 7.3* 7.8*  MG  --   --  3.0*   GFR: Estimated Creatinine Clearance: 65 mL/min (by C-G formula based on SCr of 1.2 mg/dL). Liver Function Tests: Recent Labs  Lab 07/06/20 1022 07/08/20 0355  AST 38 41  ALT 33 31  ALKPHOS 45 35*  BILITOT 1.3* 1.0  PROT 7.9 7.0  ALBUMIN 2.7* 2.4*   No results for input(s): LIPASE, AMYLASE in the last 168 hours. No results for input(s): AMMONIA in the last 168 hours. Coagulation Profile: Recent Labs  Lab 07/06/20 1022 07/07/20 0353  INR 1.2 1.3*   Cardiac Enzymes: No results for input(s): CKTOTAL, CKMB, CKMBINDEX, TROPONINI in the last 168 hours. BNP (last 3 results) No results for input(s): PROBNP in the last 8760 hours. HbA1C: No results for input(s): HGBA1C in the last 72 hours. CBG: No results for input(s): GLUCAP in the last 168 hours. Lipid  Profile: No results for input(s): CHOL, HDL, LDLCALC, TRIG, CHOLHDL, LDLDIRECT in the last 72 hours. Thyroid Function Tests: No results for input(s): TSH, T4TOTAL, FREET4, T3FREE, THYROIDAB in the last 72 hours. Anemia Panel: No results for input(s): VITAMINB12, FOLATE, FERRITIN, TIBC, IRON, RETICCTPCT in the last 72 hours. Urine analysis:    Component Value Date/Time   COLORURINE AMBER (A) 07/07/2020 0353   APPEARANCEUR HAZY (A) 07/07/2020 0353   LABSPEC 1.029 07/07/2020 0353   PHURINE 6.0 07/07/2020 0353   GLUCOSEU NEGATIVE 07/07/2020 0353   HGBUR MODERATE (A) 07/07/2020 [place] 07/07/2020 [place] 07/07/2020 0353   PROTEINUR 100 (A) 07/07/2020 0353   NITRITE NEGATIVE 07/07/2020 0353  LEUKOCYTESUR NEGATIVE 07/07/2020 0353   Sepsis Labs: Invalid input(s): PROCALCITONIN, LACTICIDVEN  Recent Results (from the past 240 hour(s))  Blood Culture (routine x 2)     Status: None (Preliminary result)   Collection Time: 07/06/20 10:22 AM   Specimen: BLOOD  Result Value Ref Range Status   Specimen Description BLOOD RIGHT [facility]  Final   Special Requests   Final    BOTTLES DRAWN AEROBIC AND ANAEROBIC Blood Culture adequate volume   Culture   Final    NO GROWTH 2 DAYS Performed at [facility], [address], [place], [place] [address]    Report Status PENDING  Incomplete  Blood Culture (routine x 2)     Status: None (Preliminary result)   Collection Time: 07/06/20 10:22 AM   Specimen: BLOOD  Result Value Ref Range Status   Specimen Description BLOOD LAC  Final   Special Requests   Final    BOTTLES DRAWN AEROBIC AND ANAEROBIC Blood Culture adequate volume   Culture   Final    NO GROWTH 2 DAYS Performed at [facility], [address] [place], [place] [address]    Report Status PENDING  Incomplete  Resp Panel by RT-PCR (Flu A&B, Covid) Nasopharyngeal Swab     Status: Abnormal   Collection Time: 07/06/20 10:22 AM   Specimen:  Nasopharyngeal Swab; Nasopharyngeal(NP) swabs in vial transport medium  Result Value Ref Range Status   SARS Coronavirus 2 by RT PCR POSITIVE (A) NEGATIVE Final    Comment: RESULT CALLED TO, READ BACK BY AND VERIFIED WITH: CHRISTINE HALLAS 07/06/20 @1400  BY ACR (NOTE) SARS-CoV-2 target nucleic acids are DETECTED.  The SARS-CoV-2 RNA is generally detectable in upper respiratory specimens during the acute phase of infection. Positive results are indicative of the presence of the identified virus, but do not rule out bacterial infection or co-infection with other pathogens not detected by the test. Clinical correlation with patient history and other diagnostic information is necessary to determine patient infection status. The expected result is Negative.  Fact Sheet for Patients: EntrepreneurPulse.com.au  Fact Sheet for Healthcare Providers: IncredibleEmployment.be  This test is not yet approved or cleared by the [place] FDA and  has been authorized for detection and/or diagnosis of SARS-CoV-2 by FDA under an Emergency Use Authorization (EUA).  This EUA will remain in effect (meaning this test can  be used) for the duration of  the COVID-19 declaration under Section 564(b)(1) of the Act, 21 U.S.C. section 360bbb-3(b)(1), unless the authorization is terminated or revoked sooner.     Influenza A by PCR NEGATIVE NEGATIVE Final   Influenza B by PCR NEGATIVE NEGATIVE Final    Comment: (NOTE) The Xpert Xpress SARS-CoV-2/FLU/RSV plus assay is intended as an aid in the diagnosis of influenza from Nasopharyngeal swab specimens and should not be used as a sole basis for treatment. Nasal washings and aspirates are unacceptable for Xpert Xpress SARS-CoV-2/FLU/RSV testing.  Fact Sheet for Patients: EntrepreneurPulse.com.au  Fact Sheet for Healthcare Providers: IncredibleEmployment.be  This test is not yet  approved or cleared by the [place] FDA and has been authorized for detection and/or diagnosis of SARS-CoV-2 by FDA under an Emergency Use Authorization (EUA). This EUA will remain in effect (meaning this test can be used) for the duration of the COVID-19 declaration under Section 564(b)(1) of the Act, 21 U.S.C. section 360bbb-3(b)(1), unless the authorization is terminated or revoked.  Performed at [facility], [address] [place], [place] [address]   Urine culture  Status: None   Collection Time: 07/07/20  3:53 AM   Specimen: In/Out Cath Urine  Result Value Ref Range Status   Specimen Description   Final    IN/OUT CATH URINE Performed at [facility], [address] [place], [place] [address]    Special Requests   Final    NONE Performed at [facility], [address] [place], [place] [address]    Culture   Final    NO GROWTH Performed at [facility], [place] [address], [place], [place] [address]    Report Status 07/08/2020 FINAL  Final      Radiology Studies: CT Head Wo Contrast  Result Date: 07/06/2020 CLINICAL DATA:  Mental status change. EXAM: CT HEAD WITHOUT CONTRAST TECHNIQUE: Contiguous axial images were obtained from the base of the skull through the vertex without intravenous contrast. COMPARISON:  None. FINDINGS: Brain: No evidence of acute infarction, hemorrhage, hydrocephalus, extra-axial collection or mass lesion/mass effect. Vascular: No hyperdense vessel or unexpected calcification. Skull: Normal. Negative for fracture or focal lesion. Sinuses/Orbits: There is a small amount of fluid posteriorly in the sphenoid sinuses. Opacification of scattered ethmoid air cells identified. Other: None. IMPRESSION: 1. No acute intracranial abnormality. 2. Sinus disease as above. Electronically Signed   By: Dorise Bullion III M.D   On: 07/06/2020 12:38   DG Chest Port 1 View  Result Date: 07/06/2020 CLINICAL DATA:  Possible  sepsis. EXAM: PORTABLE CHEST 1 VIEW COMPARISON:  None. FINDINGS: Mild cardiomegaly. Diffuse bilateral airspace opacities, most likely acute, most likely pneumonia. No pleural effusion or pneumothorax is seen. Osseous structures about the chest are unremarkable. IMPRESSION: 1. Diffuse bilateral airspace opacities, most likely acute, most likely multifocal pneumonia. 2. Mild cardiomegaly. Electronically Signed   By: Franki Cabot M.D.   On: 07/06/2020 10:56     Marzetta Board, MD, PhD [facility]  Between 7 am - 7 pm I am available, please contact me via Amion or Securechat  Between 7 pm - 7 am I am not available, please contact night coverage MD/APP via Amion

## 2020-07-08 NOTE — ED Notes (Signed)
This RN at bedside due to bed alarm sounding. Pt at end of bed stating, "I need to pee." This RN instructing pt back in bed at this time. Pt redirectable at this time. Pt provided urinal to use. Pt remains with mitts in place.

## 2020-07-08 NOTE — ED Notes (Signed)
Safety attendant remains at bedside -- pt restless in bed and has to be repositioned and redirected frequently however will follow simple commands - remains oriented to person but at times requires reminding that he is in hospital and not at home.  Does not verbalize any complaints.  Will continue to monitor for acute changes and maintain plan of care.

## 2020-07-08 NOTE — ED Notes (Signed)
Pt lying quietly in bed- upon entering room pt asking for a sip of water -- pt obliged with very small sips due to high aspiration risk.  No other needs verbalized.  A.M. labs collected at this time via straight stick to L FA; well tolerated by pt- continuous cardiac monitoring maintained- controlled A-fib HR 93

## 2020-07-08 NOTE — ED Notes (Signed)
Pt used urinal with the help of tech Pt asked for a cup of coffee. Per speech therapy they recommended letting pt try and hold a beverage or snack and trying on his own. Tech provided pt with a cooled down coffee. Tech removed one of the mitts so pt could hold cup. Pt is doing very well holding cup and drinking on his own.

## 2020-07-08 NOTE — ED Notes (Signed)
Staff nurse to bedside and reports pt assisted with use of urinal - 150cc amber urine voided at this time with assistance of staff nurse.  This nurse now at bedside - pt does not verbalize any additional questions concerns needs.

## 2020-07-08 NOTE — ED Notes (Addendum)
This nurse to bedside after bed alarm heard alarming - pt found with gown off sitting at foot of bed with legs dangling at edge of bed -- pt redirected back to bed in supine position and cooperates as nurse replaces HFNC, cardiac leads, bp cuff cord and pulse ox that were inadvertently removed when pt was attempting to climb out of bed.  Pt now lying in bed watching morning news (per his request) -- pt did at one point question why he was here and states "this is a con" - states he wants to go home and for his daughter to pick him up; pt easily redirected back to situation of hospital admission and again watching tv calm and quiet

## 2020-07-08 NOTE — ED Notes (Signed)
Pt became upset about being here. Wanting to go home. Worried about his dog and his car. Tech called his spouse, who I believe they are separated at this time, to fill pt in on some of the gaps that are happening in regards to the pt memory on things. Pt spoke with spouse, sabrina on the phone. Pt seems to be more calm now that he spoke with her.  Pt has no memory of getting here or what has happened in past 48 hrs.

## 2020-07-09 ENCOUNTER — Inpatient Hospital Stay: Payer: Medicare Other

## 2020-07-09 DIAGNOSIS — J189 Pneumonia, unspecified organism: Secondary | ICD-10-CM | POA: Diagnosis not present

## 2020-07-09 DIAGNOSIS — J9601 Acute respiratory failure with hypoxia: Secondary | ICD-10-CM | POA: Diagnosis not present

## 2020-07-09 LAB — COMPREHENSIVE METABOLIC PANEL
ALT: 42 U/L (ref 0–44)
AST: 58 U/L — ABNORMAL HIGH (ref 15–41)
Albumin: 2.5 g/dL — ABNORMAL LOW (ref 3.5–5.0)
Alkaline Phosphatase: 36 U/L — ABNORMAL LOW (ref 38–126)
Anion gap: 12 (ref 5–15)
BUN: 41 mg/dL — ABNORMAL HIGH (ref 8–23)
CO2: 26 mmol/L (ref 22–32)
Calcium: 8 mg/dL — ABNORMAL LOW (ref 8.9–10.3)
Chloride: 109 mmol/L (ref 98–111)
Creatinine, Ser: 1.26 mg/dL — ABNORMAL HIGH (ref 0.61–1.24)
GFR, Estimated: >60 mL/min (ref >60)
Glucose, Bld: 176 mg/dL — ABNORMAL HIGH (ref 70–99)
Potassium: 3.5 mmol/L (ref 3.5–5.1)
Sodium: 147 mmol/L — ABNORMAL HIGH (ref 135–145)
Total Bilirubin: 1 mg/dL (ref 0.3–1.2)
Total Protein: 6.9 g/dL (ref 6.5–8.1)

## 2020-07-09 LAB — CBC
HCT: 49.3 % (ref 39.0–52.0)
Hemoglobin: 16.3 g/dL (ref 13.0–17.0)
MCH: 29.9 pg (ref 26.0–34.0)
MCHC: 33.1 g/dL (ref 30.0–36.0)
MCV: 90.5 fL (ref 80.0–100.0)
Platelets: 556 10*3/uL — ABNORMAL HIGH (ref 150–400)
RBC: 5.45 MIL/uL (ref 4.22–5.81)
RDW: 13.9 % (ref 11.5–15.5)
WBC: 11.3 10*3/uL — ABNORMAL HIGH (ref 4.0–10.5)
nRBC: 0 % (ref 0.0–0.2)

## 2020-07-09 LAB — C-REACTIVE PROTEIN: CRP: 2.9 mg/dL — ABNORMAL HIGH (ref <1.0)

## 2020-07-09 LAB — LEGIONELLA PNEUMOPHILA SEROGP 1 UR AG: L. pneumophila Serogp 1 Ur Ag: NEGATIVE

## 2020-07-09 LAB — D-DIMER, QUANTITATIVE: D-Dimer, Quant: 1.09 ug/mL-FEU — ABNORMAL HIGH (ref 0.00–0.50)

## 2020-07-09 MED ORDER — SODIUM CHLORIDE 0.9 % IV SOLN
INTRAVENOUS | Status: DC | PRN
  Administered 2020-07-09: 250 mL via INTRAVENOUS

## 2020-07-09 MED ORDER — TAMSULOSIN HCL 0.4 MG PO CAPS
0.4000 mg | ORAL_CAPSULE | Freq: Every day | ORAL | Status: DC
  Administered 2020-07-09: 18:00:00 0.4 mg via ORAL
  Filled 2020-07-09: qty 1

## 2020-07-09 MED ORDER — LORAZEPAM 2 MG/ML IJ SOLN
0.5000 mg | Freq: Once | INTRAMUSCULAR | Status: AC | PRN
  Administered 2020-07-09: 23:00:00 0.5 mg via INTRAVENOUS
  Filled 2020-07-09: qty 1

## 2020-07-09 NOTE — Progress Notes (Signed)
PROGRESS NOTE  Jeffrey Torres LNL:[id number] DOB: [birth year] DOA: 07/06/2020 PCP: Birdie Sons, MD   LOS: 3 days   Brief Narrative / Interim history: 75 year old male at baseline independent, living by himself, presented to the hospital with confusion and progressive decline since Christmas.  At that time he was complaining of feeling unwell with cough, upper respiratory symptoms, did not seek care.  5 days ago he was having worsening mentation, decreased oral intake and had a fall at home.  Given progressive weakness and confusion family brought him to the emergency room.  He was found to be hypoxic, chest x-ray showed multifocal pneumonia, tested positive for Covid and he was admitted to the hospital.  Hospital course complicated by delirium  Subjective / 24h Interval events: Remains confused, has mittens on.  No complaints.  Surprised to hear that he is in the hospital with Covid.  Overall much Calmer than yesterday  Assessment & Plan:  Principal Problem Acute Hypoxic Respiratory Failure due to Covid-19 Viral Illness -Patient started on steroids, Remdesivir, baricitinib, continue.  Remains on 5 L this morning, overall much improved from 12 on admission -Guarded prognosis given the fact that he was at home for 2 weeks with progressive symptoms -Patient also started on Levaquin, completed 3 days   COVID-19 Labs  Recent Labs    07/07/20 1037 07/08/20 0355  DDIMER 1.47* 1.16*  CRP 12.2* 7.9*    Lab Results  Component Value Date   SARSCOV2NAA POSITIVE (A) 07/06/2020   [place] NEGATIVE 03/08/2020   [place] NEGATIVE 08/04/2019   [place] NEGATIVE 07/14/2019    Active Problems Acute metabolic encephalopathy -Due to infection, treat underlying process.  Per family he has no history of dementia and living independently.  Obtain an MRI of the brain given relative lack of improvement, I do have a suspicion of mild cognitive impairment  Hypernatremia -Due to dehydration  and poor p.o. intake, continue D5W, Na improving  Acute kidney injury -Due to poor p.o. intake and dehydration, creatinine improved with fluids.  Still has poor p.o. intake in the hospital, continue fluids  History of squamous cell cancer of the larynx -Outpatient management  Essential hypertension -He is normotensive now, on Cardizem  History of bigeminy -Monitor on telemetry, continue Cardizem   Scheduled Meds: . aspirin  81 mg Oral Daily  . baricitinib  4 mg Oral Daily  . diltiazem  120 mg Oral Daily  . enoxaparin (LOVENOX) injection  50 mg Subcutaneous Q24H  . fluticasone  2 spray Each Nare [id number]  . guaiFENesin  600 mg Oral BID  . methylPREDNISolone (SOLU-MEDROL) injection  50 mg Intravenous Q12H  . metoprolol tartrate  5 mg Intravenous Q6H  . pantoprazole  40 mg Oral Daily   Continuous Infusions: . sodium chloride    . dextrose 50 mL/hr at 07/09/20 0015  . remdesivir 100 mg in NS 100 mL Stopped (07/08/20 1035)   PRN Meds:.sodium chloride, albuterol, haloperidol lactate  DVT prophylaxis: Lovenox Code Status: Full code Family Communication: Updated daughter Hal Hope [phone] over the phone  Status is: Inpatient  Remains inpatient appropriate because:Inpatient level of care appropriate due to severity of illness   Dispo: The patient is from: Home              Anticipated d/c is to: Home              Anticipated d/c date is: > 3 days              Patient  currently is not medically stable to d/c.  Consultants:  None   Procedures:  None   Microbiology: None   Antibacterials: Levaquin 3 days, done 1/10  Objective: Vitals:   07/08/20 1917 07/08/20 2242 07/09/20 0545 07/09/20 0720  BP: (!) 138/97 130/85 126/88 (!) 144/99  Pulse: 99 84 (!) 59 90  Resp: 16 17 16    Temp: 98.4 F (36.9 C) 97.8 F (36.6 C) 98.8 F (37.1 C) 97.8 F (36.6 C)  TempSrc: Oral   Oral  SpO2: (!) 86% 91% 95% 97%  Weight:      Height:        Intake/Output Summary (Last 24  hours) at 07/09/2020 1000 Last data filed at 07/09/2020 0015 Gross per 24 hour  Intake 806.7 ml  Output 200 ml  Net 606.7 ml   Filed Weights   07/06/20 1012  Weight: 100 kg    Examination:  Constitutional: confused, mittens on Eyes: no icterus  ENMT: mmm Neck: normal, supple Respiratory: bibasilar rhonchi, no wheezing, no crackles Cardiovascular: rrr, no mrg, no edema  Abdomen: soft, nt, nd, bs+ Musculoskeletal: no clubbing / cyanosis.  Skin: no rahses Neurologic: non focal Psychiatric: alert to self  Data Reviewed: I have independently reviewed following labs and imaging studies   CBC: Recent Labs  Lab 07/06/20 1022 07/07/20 0353 07/08/20 0355 07/09/20 0621  WBC 7.7 6.3 8.2 11.3*  NEUTROABS 6.4 5.3  --   --   HGB 15.8 14.4 16.9 16.3  HCT 48.1 44.4 51.8 49.3  MCV 93.2 92.9 91.8 90.5  PLT 533* 524* 607* 767*   Basic Metabolic Panel: Recent Labs  Lab 07/06/20 1022 07/07/20 0353 07/08/20 0355 07/09/20 0621  NA 149* 148* 150* 147*  K 3.6 3.3* 3.3* 3.5  CL 104 112* 112* 109  CO2 30 25 24 26   GLUCOSE 151* 152* 172* 176*  BUN 37* 33* 42* 41*  CREATININE 1.31* 1.03 1.20 1.26*  CALCIUM 8.0* 7.3* 7.8* 8.0*  MG  --   --  3.0*  --    GFR: Estimated Creatinine Clearance: 61.9 mL/min (A) (by C-G formula based on SCr of 1.26 mg/dL (H)). Liver Function Tests: Recent Labs  Lab 07/06/20 1022 07/08/20 0355 07/09/20 0621  AST 38 41 58*  ALT 33 31 42  ALKPHOS 45 35* 36*  BILITOT 1.3* 1.0 1.0  PROT 7.9 7.0 6.9  ALBUMIN 2.7* 2.4* 2.5*   No results for input(s): LIPASE, AMYLASE in the last 168 hours. No results for input(s): AMMONIA in the last 168 hours. Coagulation Profile: Recent Labs  Lab 07/06/20 1022 07/07/20 0353  INR 1.2 1.3*   Cardiac Enzymes: No results for input(s): CKTOTAL, CKMB, CKMBINDEX, TROPONINI in the last 168 hours. BNP (last 3 results) No results for input(s): PROBNP in the last 8760 hours. HbA1C: No results for input(s): HGBA1C in the  last 72 hours. CBG: No results for input(s): GLUCAP in the last 168 hours. Lipid Profile: No results for input(s): CHOL, HDL, LDLCALC, TRIG, CHOLHDL, LDLDIRECT in the last 72 hours. Thyroid Function Tests: No results for input(s): TSH, T4TOTAL, FREET4, T3FREE, THYROIDAB in the last 72 hours. Anemia Panel: No results for input(s): VITAMINB12, FOLATE, FERRITIN, TIBC, IRON, RETICCTPCT in the last 72 hours. Urine analysis:    Component Value Date/Time   COLORURINE AMBER (A) 07/07/2020 0353   APPEARANCEUR HAZY (A) 07/07/2020 0353   LABSPEC 1.029 07/07/2020 0353   PHURINE 6.0 07/07/2020 0353   GLUCOSEU NEGATIVE 07/07/2020 0353   HGBUR MODERATE (A) 07/07/2020 0353  BILIRUBINUR NEGATIVE 07/07/2020 [place] 07/07/2020 0353   PROTEINUR 100 (A) 07/07/2020 0353   NITRITE NEGATIVE 07/07/2020 0353   LEUKOCYTESUR NEGATIVE 07/07/2020 0353   Sepsis Labs: Invalid input(s): PROCALCITONIN, LACTICIDVEN  Recent Results (from the past 240 hour(s))  Blood Culture (routine x 2)     Status: None (Preliminary result)   Collection Time: 07/06/20 10:22 AM   Specimen: BLOOD  Result Value Ref Range Status   Specimen Description BLOOD RIGHT [facility]  Final   Special Requests   Final    BOTTLES DRAWN AEROBIC AND ANAEROBIC Blood Culture adequate volume   Culture   Final    NO GROWTH 3 DAYS Performed at [facility], [address], [place], [place] [address]    Report Status PENDING  Incomplete  Blood Culture (routine x 2)     Status: None (Preliminary result)   Collection Time: 07/06/20 10:22 AM   Specimen: BLOOD  Result Value Ref Range Status   Specimen Description BLOOD LAC  Final   Special Requests   Final    BOTTLES DRAWN AEROBIC AND ANAEROBIC Blood Culture adequate volume   Culture   Final    NO GROWTH 3 DAYS Performed at [facility], [address], [place], [place] [address]    Report Status PENDING  Incomplete  Resp Panel by RT-PCR (Flu A&B, Covid)  Nasopharyngeal Swab     Status: Abnormal   Collection Time: 07/06/20 10:22 AM   Specimen: Nasopharyngeal Swab; Nasopharyngeal(NP) swabs in vial transport medium  Result Value Ref Range Status   SARS Coronavirus 2 by RT PCR POSITIVE (A) NEGATIVE Final    Comment: RESULT CALLED TO, READ BACK BY AND VERIFIED WITH: CHRISTINE HALLAS 07/06/20 @1400  BY ACR (NOTE) SARS-CoV-2 target nucleic acids are DETECTED.  The SARS-CoV-2 RNA is generally detectable in upper respiratory specimens during the acute phase of infection. Positive results are indicative of the presence of the identified virus, but do not rule out bacterial infection or co-infection with other pathogens not detected by the test. Clinical correlation with patient history and other diagnostic information is necessary to determine patient infection status. The expected result is Negative.  Fact Sheet for Patients: EntrepreneurPulse.com.au  Fact Sheet for Healthcare Providers: IncredibleEmployment.be  This test is not yet approved or cleared by the [place] FDA and  has been authorized for detection and/or diagnosis of SARS-CoV-2 by FDA under an Emergency Use Authorization (EUA).  This EUA will remain in effect (meaning this test can  be used) for the duration of  the COVID-19 declaration under Section 564(b)(1) of the Act, 21 U.S.C. section 360bbb-3(b)(1), unless the authorization is terminated or revoked sooner.     Influenza A by PCR NEGATIVE NEGATIVE Final   Influenza B by PCR NEGATIVE NEGATIVE Final    Comment: (NOTE) The Xpert Xpress SARS-CoV-2/FLU/RSV plus assay is intended as an aid in the diagnosis of influenza from Nasopharyngeal swab specimens and should not be used as a sole basis for treatment. Nasal washings and aspirates are unacceptable for Xpert Xpress SARS-CoV-2/FLU/RSV testing.  Fact Sheet for Patients: EntrepreneurPulse.com.au  Fact Sheet for  Healthcare Providers: IncredibleEmployment.be  This test is not yet approved or cleared by the [place] FDA and has been authorized for detection and/or diagnosis of SARS-CoV-2 by FDA under an Emergency Use Authorization (EUA). This EUA will remain in effect (meaning this test can be used) for the duration of the COVID-19 declaration under Section 564(b)(1) of the Act, 21 U.S.C. section  360bbb-3(b)(1), unless the authorization is terminated or revoked.  Performed at [facility], [address] [place], [place] [address]   Urine culture     Status: None   Collection Time: 07/07/20  3:53 AM   Specimen: In/Out Cath Urine  Result Value Ref Range Status   Specimen Description   Final    IN/OUT CATH URINE Performed at [facility], [address], [place], [place] [address]    Special Requests   Final    NONE Performed at [facility], [address], [place], [place] [address]    Culture   Final    NO GROWTH Performed at [facility], [place] [address] [place], [place] [address]    Report Status 07/08/2020 FINAL  Final  MRSA PCR Screening     Status: None   Collection Time: 07/08/20  2:13 PM   Specimen: Nasopharyngeal  Result Value Ref Range Status   MRSA by PCR NEGATIVE NEGATIVE Final    Comment:        The GeneXpert MRSA Assay (FDA approved for NASAL specimens only), is one component of a comprehensive MRSA colonization surveillance program. It is not intended to diagnose MRSA infection nor to guide or monitor treatment for MRSA infections. Performed at [facility], [address] [place], [place] [address]       Radiology Studies: No results found.   Marzetta Board, MD, PhD [facility]  Between 7 am - 7 pm I am available, please contact me via Amion or Securechat  Between 7 pm - 7 am I am not available, please contact night coverage MD/APP via Amion

## 2020-07-09 NOTE — Progress Notes (Signed)
Voicemail left for MRI to update on the ETA.

## 2020-07-09 NOTE — Progress Notes (Signed)
Wife is updated on the status of the patient. Pt is stable at this time as he is currently resting in bed with his eyes closed and do not exhibits any obvious signs of distress that warrants medical interventions at this time. Pt's bed is in the lowest position and call bell is within reach. Soft mitten remain in place at this time.

## 2020-07-09 NOTE — Progress Notes (Addendum)
Speech Language Pathology Treatment: Dysphagia  Patient Details Name: Jeffrey Torres MRN: [id number] DOB: [birth year] Today's Date: 07/09/2020 Time: 3716-9678 SLP Time Calculation (min) (ACUTE ONLY): 40 min  Assessment / Plan / Recommendation Clinical Impression  Pt seen for ongoing assessment of toleration of diet; trials at lunch meal w/ SLP to upgrade diet consistency. Pt continues to have some confusion per MD note; distracted at times and required verbal and visual cues intermittently for follow through w/ tasks. Pt was verbal and followed basic instructions. Much support/cues given to correct his positioning in the bed as he was hunched down in the bed - pt did not seem to have insight into needing to position himself upright for safe eating/drinking and was "making do". Pt is HOH and has macular degeneration per chart. This can impact pt's overall engagement. Pt appears to present w/ grossly functional swallowing w/ No overt oropharyngeal phase dysphagia noted; he is best supported by following general aspiration precautions(small sips/bites; sitting fully upright w/ all oral intake). W/ general aspiration precautions implemented and followed during the po trials, No overt, clinical s/s of aspiration during po trials. Pt appears at reduced risk for aspiration following general aspiration precautions. During po trials, pt consumed all consistencies w/ no immediate overt coughing, decline in vocal quality, or change in respiratory presentation during/post trials. Instructed on Cup drinking for better oral control of the bolus. Oral phase appeared grossly [facility] for bolus management, mastication, and propulsion for A-P transfer for swallowing. Min increased Time needed to fully masticate/clear the more solid textured foods -- moistening of foods added to ease effort. Pt has full Dentition baseline. Oral clearing achieved w/ all trial consistencies given Time and moistening or alternating foods/liquids. Pt  consumed an Ensure during session for nutritional support. Time spent on education of aspiration precautions and sitting up for eating meals. Recommend trial a Mech Soft consistency diet w/ chopped/minced meats, moistened foods/gravies; Thin liquids via Cup. Recommend general aspiration precautions, Pills Whole vs Crushed in Puree for safer, easier swallowing per NSG. Support w/ setup and feeding at all meals as needed. Education given on Pills in Puree; food consistency; general aspiration precautions for room, chart. ST services can be available for further needs while admitted. Pt would appear to need 100% Supervision for follow through w/ aspiration precautions if returning Home vs SNF. NSG/MD updated. Handouts given. A Dietician consult could be beneficial for support.    HPI HPI: Pt is a Jeffrey Torres who lives by himself, IADL and ADL independent.  Patient is unvaccinated for Covid.  He had been in his usual state of health until Christmas Eve when he had complained of feeling unwell with cough and URI symptoms.  Patient did not seek any medical attention at the time.  As per daughter,since 5 days ago, patient was noted to have worsening altered mental status with poor oral intake.  Family able to monitor patient's activities using the camera.  Patient apparently had reported to family members that he had a fall in his bathtub within the past few days.  Fall were unwitnessed.  No known seizures.  Today, patient was noted to be significantly weak and altered in mental status hence family decided to bring patient into the emergency room.  Patient was reported to be hypoxic on presentation requiring nonrebreather mask.  He is currently being weaned down to nasal cannula.  Initial Covid test came back negative on the antigen test.  Chest x-ray however concerning for multifocal pneumonia.  PMH:  includes GERD, Cancer - larynx ca, surgery and radiation(per Soft Tissue of Neck in 2018: Small area of  soft tissue thickening enhancement right anterior  vocal cord compatible with known neoplasm. No adenopathy in the  neck), HOH, Macular degeneration of both eyes.      SLP Plan  Continue with current plan of care       Recommendations  Diet recommendations: Dysphagia 3 (mechanical soft);Thin liquid Liquids provided via: Cup;No straw Medication Administration: Crushed with puree (vs Whole as able w/ NSG) Supervision: Patient able to self feed;Staff to assist with self feeding;Intermittent supervision to cue for compensatory strategies Compensations: Minimize environmental distractions;Slow rate;Small sips/bites;Lingual sweep for clearance of pocketing;Follow solids with liquid Postural Changes and/or Swallow Maneuvers: Seated upright 90 degrees;Out of bed for meals;Upright 30-60 min after meal                General recommendations:  (dietician f/u) Oral Care Recommendations: Oral care BID;Oral care before and after PO;Staff/trained caregiver to provide oral care Follow up Recommendations: None SLP Visit Diagnosis: Dysphagia, oropharyngeal phase (R13.12) Plan: Continue with current plan of care       GO                 [name], [place], [place] Pathologist Rehab Services [phone] [facility] 07/09/2020, 3:42 PM

## 2020-07-09 NOTE — Evaluation (Signed)
Physical Therapy Evaluation Patient Details Name: Jeffrey Torres MRN: [id number] DOB: 16-Apr-1946 Today's Date: 07/09/2020   History of Present Illness  Patient is a 75 year old Caucasian male who lives by himself, IADL and ADL independent.  Patient is unvaccinated for Covid.  He had been in his usual state of health until Christmas Eve when he had complained of feeling unwell with cough and URI symptoms.  Patient did not seek any medical attention at the time.  As per daughter,since 5 days ago, patient was noted to have worsening altered mental status with poor oral intake.  Family able to monitor patient's activities using the camera.  Patient apparently had reported to family members that he had a fall in his bathtub within the past few days.  Fall were unwitnessed.  No known seizures.  Today, patient was noted to be significantly weak and altered in mental status hence family decided to bring patient into the emergency room.  Patient was reported to be hypoxic on presentation requiring nonrebreather mask.  He is currently being weaned down to nasal cannula.  Initial Covid test came back negative on the antigen test.  Chest x-ray however concerning for multifocal pneumonia.  COVID +  Clinical Impression  Patient received in bed, alert and oriented to person and place. Pleasant and cooperative. He requires increased time with mobility and increased time/multimodal cues for processing requests for mobility. He performed bed mobility with min guard. Sit to stand with min assist. Able to take a few steps at edge of bed and marching in place. O2 saturations decreased to 86%. Patient will continue to benefit from skilled PT while here to improve functional independence and strength for improved safety with mobility.        Follow Up Recommendations Home health PT;Supervision/Assistance - 24 hour    Equipment Recommendations  Other (comment) (TBD)    Recommendations for Other Services       Precautions  / Restrictions Precautions Precautions: Fall Restrictions Weight Bearing Restrictions: No      Mobility  Bed Mobility Overal bed mobility: Needs Assistance Bed Mobility: Supine to Sit;Sit to Supine     Supine to sit: Min guard Sit to supine: Min guard   General bed mobility comments: slow to respond to cues to sit up on the edge of bed. Requires multimodal cues to perform    Transfers Overall transfer level: Needs assistance Equipment used: 1 person hand held assist Transfers: Sit to/from Stand Sit to Stand: Min guard            Ambulation/Gait Ambulation/Gait assistance: Min guard Gait Distance (Feet): 6 Feet Assistive device: 1 person hand held assist Gait Pattern/deviations: Shuffle;Decreased step length - right;Decreased step length - left     General Gait Details: patient requires min assist/min guard for ambulating 3 feet forward and 3 feet back, standing marching in place. O2 sats down to 86%  Stairs            Wheelchair Mobility    Modified Rankin (Stroke Patients Only)       Balance Overall balance assessment: Needs assistance Sitting-balance support: Feet supported Sitting balance-Leahy Scale: Fair     Standing balance support: No upper extremity supported;During functional activity Standing balance-Leahy Scale: Fair                               Pertinent Vitals/Pain Pain Assessment: No/denies pain    Home Living Family/patient expects to be  discharged to:: Private residence Living Arrangements: Alone Available Help at Discharge: Family;Available PRN/intermittently Type of Home: House         Home Equipment: None      Prior Function Level of Independence: Independent         Comments: per family, he was independent with ambulation and ADLs     Hand Dominance        Extremity/Trunk Assessment   Upper Extremity Assessment Upper Extremity Assessment: Defer to OT evaluation    Lower Extremity  Assessment Lower Extremity Assessment: Generalized weakness    Cervical / Trunk Assessment Cervical / Trunk Assessment: Normal  Communication   Communication: No difficulties  Cognition Arousal/Alertness: Awake/alert Behavior During Therapy: WFL for tasks assessed/performed Overall Cognitive Status: Within Functional Limits for tasks assessed                                        General Comments      Exercises     Assessment/Plan    PT Assessment Patient needs continued PT services  PT Problem List Decreased strength;Decreased mobility;Decreased activity tolerance;Decreased balance;Decreased cognition;Decreased knowledge of use of DME;Decreased knowledge of precautions;Decreased safety awareness;Cardiopulmonary status limiting activity       PT Treatment Interventions Therapeutic activities;Therapeutic exercise;Gait training;Patient/family education;Balance training;Functional mobility training;DME instruction    PT Goals (Current goals can be found in the Care Plan section)  Acute Rehab PT Goals Patient Stated Goal: none stated PT Goal Formulation: With patient Time For Goal Achievement: 07/23/20    Frequency Min 2X/week   Barriers to discharge Decreased caregiver support      Co-evaluation               AM-PAC PT "6 Clicks" Mobility  Outcome Measure Help needed turning from your back to your side while in a flat bed without using bedrails?: A Little Help needed moving from lying on your back to sitting on the side of a flat bed without using bedrails?: A Little Help needed moving to and from a bed to a chair (including a wheelchair)?: A Little Help needed standing up from a chair using your arms (e.g., wheelchair or bedside chair)?: A Little Help needed to walk in hospital room?: A Little Help needed climbing 3-5 steps with a railing? : A Lot 6 Click Score: 17    End of Session Equipment Utilized During Treatment: Oxygen Activity  Tolerance: Patient limited by fatigue;Other (comment) (O2 saturations) Patient left: in bed;with call bell/phone within reach;with bed alarm set Nurse Communication: Mobility status PT Visit Diagnosis: Muscle weakness (generalized) (M62.81);Difficulty in walking, not elsewhere classified (R26.2);History of falling (Z91.81);Unsteadiness on feet (R26.81)    Time: 1354-1410 PT Time Calculation (min) (ACUTE ONLY): 16 min   Charges:   PT Evaluation $PT Eval Moderate Complexity: 1 Mod          Aseel Uhde, PT, GCS 07/09/20,2:42 PM

## 2020-07-09 NOTE — Evaluation (Signed)
Occupational Therapy Evaluation Patient Details Name: Jeffrey Torres MRN: [id number] DOB: Jan 02, 1946 Today's Date: 07/09/2020    History of Present Illness Patient is a 75 year old Caucasian male who lives by himself, IADL and ADL independent.  Patient is unvaccinated for Covid.  He had been in his usual state of health until Christmas Eve when he had complained of feeling unwell with cough and URI symptoms.  Patient did not seek any medical attention at the time.  As per daughter,since 5 days ago, patient was noted to have worsening altered mental status with poor oral intake.  Family able to monitor patient's activities using the camera.  Patient apparently had reported to family members that he had a fall in his bathtub within the past few days.  Fall were unwitnessed.  No known seizures.  Today, patient was noted to be significantly weak and altered in mental status hence family decided to bring patient into the emergency room.  Patient was reported to be hypoxic on presentation requiring nonrebreather mask.  He is currently being weaned down to nasal cannula.  Initial Covid test came back negative on the antigen test.  Chest x-ray however concerning for multifocal pneumonia.  COVID +   Clinical Impression   Pt was seen for OT evaluation this date. Prior to hospital admission, pt was independent, living alone with his dog, and pt denies difficulty with mobility, ADL, or IADL. Pt alert and oriented, following commands with cues. Performs ADL mobility with sup-HHA. Dons/doffs socks EOB without assist. Denies SOB. On 5.5L with O2 desat to 88% with standing/steps, improving to mid 90's with rest. Pt would benefit from skilled OT services to address noted impairments and functional limitations (see below for any additional details) in order to maximize safety and independence while minimizing falls risk and caregiver burden. Upon hospital discharge, recommend [place] and initial 24/7 supervision/assist  available to maximize pt safety and return to functional independence during meaningful occupations of daily life. Mitts left off at end of session, RN notified.     Follow Up Recommendations  Home health OT;Supervision/Assistance - 24 hour    Equipment Recommendations  None recommended by OT    Recommendations for Other Services       Precautions / Restrictions Precautions Precautions: Fall Restrictions Weight Bearing Restrictions: No      Mobility Bed Mobility Overal bed mobility: Needs Assistance Bed Mobility: Supine to Sit;Sit to Supine     Supine to sit: Min guard Sit to supine: Min guard   General bed mobility comments: slow to respond to cues to sit up on the edge of bed. Requires multimodal cues to perform    Transfers Overall transfer level: Needs assistance Equipment used: 1 person hand held assist Transfers: Sit to/from Stand Sit to Stand: Min guard              Balance Overall balance assessment: Needs assistance Sitting-balance support: Feet supported Sitting balance-Leahy Scale: Fair     Standing balance support: No upper extremity supported;During functional activity Standing balance-Leahy Scale: Fair                             ADL either performed or assessed with clinical judgement   ADL Overall ADL's : Needs assistance/impaired  General ADL Comments: dons/doffs socks EOB with supervision, CGA/HHA for ADL transfers     Vision Patient Visual Report: No change from baseline       Perception     Praxis      Pertinent Vitals/Pain Pain Assessment: No/denies pain     Hand Dominance     Extremity/Trunk Assessment Upper Extremity Assessment Upper Extremity Assessment: Generalized weakness   Lower Extremity Assessment Lower Extremity Assessment: Generalized weakness   Cervical / Trunk Assessment Cervical / Trunk Assessment: Normal   Communication  Communication Communication: No difficulties   Cognition Arousal/Alertness: Awake/alert Behavior During Therapy: WFL for tasks assessed/performed Overall Cognitive Status: Within Functional Limits for tasks assessed                                 General Comments: follows commands with cues   General Comments  O2 sats 88%-90's on 5.5L, denied SOB    Exercises     Shoulder Instructions      Home Living Family/patient expects to be discharged to:: Private residence Living Arrangements: Alone Available Help at Discharge: Family;Available PRN/intermittently Type of Home: House                       Home Equipment: None          Prior Functioning/Environment Level of Independence: Independent        Comments: per family, he was independent with ambulation and ADLs        OT Problem List: Decreased strength;Decreased activity tolerance;Impaired balance (sitting and/or standing);Decreased knowledge of use of DME or AE;Decreased safety awareness      OT Treatment/Interventions: Self-care/ADL training;Therapeutic exercise;Therapeutic activities;Energy conservation;DME and/or AE instruction;Patient/family education;Balance training    OT Goals(Current goals can be found in the care plan section) Acute Rehab OT Goals Patient Stated Goal: none stated OT Goal Formulation: With patient Time For Goal Achievement: 07/23/20 Potential to Achieve Goals: Good ADL Goals Pt Will Perform Lower Body Dressing: with modified independence;sit to/from stand Pt Will Transfer to Toilet: with supervision;ambulating;regular height toilet (LRAD for amb, O2 sats >90%) Additional ADL Goal #1: Pt will verbalize plan to implement at least 1 learned ECS to maximize safety/indep with ADL.  OT Frequency: Min 1X/week   Barriers to D/C:            Co-evaluation PT/OT/SLP Co-Evaluation/Treatment: Yes Reason for Co-Treatment: For patient/therapist safety;To address  functional/ADL transfers PT goals addressed during session: Mobility/safety with mobility;Balance OT goals addressed during session: ADL's and self-care      AM-PAC OT "6 Clicks" Daily Activity     Outcome Measure Help from another person eating meals?: None Help from another person taking care of personal grooming?: None Help from another person toileting, which includes using toliet, bedpan, or urinal?: A Little Help from another person bathing (including washing, rinsing, drying)?: A Little Help from another person to put on and taking off regular upper body clothing?: None Help from another person to put on and taking off regular lower body clothing?: A Little 6 Click Score: 21   End of Session Nurse Communication: Mobility status  Activity Tolerance: Patient tolerated treatment well Patient left: in bed;with call bell/phone within reach;with bed alarm set  OT Visit Diagnosis: Other abnormalities of gait and mobility (R26.89);Muscle weakness (generalized) (M62.81)                Time: 2458-0998 OT Time Calculation (min): 12  min Charges:  OT General Charges $OT Visit: 1 Visit OT Evaluation $OT Eval Low Complexity: 1 Low  Jeni Salles, MPH, MS, OTR/L ascom [phone] 07/09/20, 3:20 PM

## 2020-07-09 NOTE — Progress Notes (Signed)
MRI contacted on ETA of the procedure as patient is still in his room

## 2020-07-09 NOTE — Progress Notes (Signed)
Wife is update on the current status to the patient. At this time, patient is stable. His oxygen therapy has been decreased from 10l to 5L [place]. Pt oxygen saturation is 94% on 5L [place] and he does not exhibit any signs of respiratory distress.He is sitting up in bed watching television. Mittens have been removed due to patient being less aggressive with staff and more alert and not pulling and removing devices. MRI procedure is pending at this time.

## 2020-07-09 NOTE — TOC Initial Note (Signed)
Transition of Care [facility]) - Initial/Assessment Note    Patient Details  Name: Jeffrey Torres MRN: [id number] Date of Birth: [birth year]  Transition of Care [facility]) CM/SW Contact:    Shelbie Hutching, RN Phone Number: 07/09/2020, 3:52 PM  Clinical Narrative:                 Patient admitted to the hospital with COVID pneumonia currently requiring acute oxygen at 5.5L.  Patient has been confused in the hospital.  RNCM was able to speak with the patient's wife, Gabriel Cirri, over the phone.  Patient and wife live in [place].  Gabriel Cirri reports that the patient is very independent and active at home and requires no assistive devices.  Gabriel Cirri agrees to home health services at discharge and has no agency preference.  Corene Cornea with [facility] accepted referral for RN, PT, and OT.   TOC will cont to follow and assist with discharge planning.   Expected Discharge Plan: [place] Barriers to Discharge: Continued Medical Work up   Patient Goals and CMS Choice Patient states their goals for this hospitalization and ongoing recovery are:: Patient unable to state goals but wife would be glad for patient to come home with home health services. CMS Medicare.gov Compare Post Acute Care list provided to:: Patient Represenative (must comment) Choice offered to / list presented to : Spouse  Expected Discharge Plan and Services Expected Discharge Plan: [place]   Discharge Planning Services: CM Consult Post Acute Care Choice: [place] arrangements for the past 2 months: [place]: RN,PT,OT [place] Agency: [place] ([place]) Date HH Agency Contacted: 07/09/20 Time [place]: [place] Representative spoke with at [employer]: Corene Cornea  Prior Living Arrangements/Services Living arrangements for the past 2 months: [place] with:: Spouse Patient language and need for interpreter reviewed::  Yes Do you feel safe going back to the place where you live?: Yes      Need for Family Participation in Patient Care: Yes (Comment) (COVID) Care giver support system in place?: Yes (comment) (wife and daughter)   Criminal Activity/Legal Involvement Pertinent to Current Situation/Hospitalization: No - Comment as needed  Activities of Daily Living Home Assistive Devices/Equipment: None ADL Screening (condition at time of admission) Patient's cognitive ability adequate to safely complete daily activities?: No Is the patient deaf or have difficulty hearing?: No Does the patient have difficulty seeing, even when wearing glasses/contacts?: No Does the patient have difficulty concentrating, remembering, or making decisions?: Yes Patient able to express need for assistance with ADLs?: No Does the patient have difficulty dressing or bathing?: Yes Independently performs ADLs?: Yes (appropriate for developmental age) Does the patient have difficulty walking or climbing stairs?: Yes Weakness of Legs: None Weakness of Arms/Hands: None  Permission Sought/Granted Permission sought to share information with : Case Manager,Family Supports,Other (comment) Permission granted to share information with : Yes, Verbal Permission Granted  Share Information with NAME: Gabriel Cirri  Permission granted to share info w AGENCY: [facility]  Permission granted to share info w Relationship: wife     Emotional Assessment       Orientation: : Oriented to Self Alcohol / Substance Use: Not Applicable Psych Involvement: No (comment)  Admission diagnosis:  Pneumonia [J18.9] Acute respiratory failure with hypoxemia ([place]) [J96.01] Multifocal pneumonia [J18.9] Patient Active Problem  List   Diagnosis Date Noted  . Pneumonia 07/06/2020  . Pneumonia 07/06/2020  . History of colonic polyps   . Polyp of descending colon   . Severe obstructive sleep apnea 02/21/2020  . Essential hypertension 08/14/2019  . Atrial bigeminy  08/14/2019  . History of laryngeal cancer 12/28/2017  . History of adenomatous polyp of colon 10/30/2015  . Inguinal hernia, bilateral 10/21/2015  . PVC (premature ventricular contraction) 10/21/2015  . Glucosuria 10/21/2015  . Health maintenance examination 04/19/2013  . Tobacco dependence 04/19/2013  . Umbilical hernia 11/91/4782   PCP:  Birdie Sons, MD Pharmacy:   [facility], [place] [address] [place] [address] Phone: [phone] Fax: [phone]  [place], [place] [address] [place] [address] Phone: [phone] Fax: [phone]     Social Determinants of Health (SDOH) Interventions    Readmission Risk Interventions No flowsheet data found.

## 2020-07-10 DIAGNOSIS — J189 Pneumonia, unspecified organism: Secondary | ICD-10-CM | POA: Diagnosis not present

## 2020-07-10 LAB — D-DIMER, QUANTITATIVE: D-Dimer, Quant: 1.73 ug/mL-FEU — ABNORMAL HIGH (ref 0.00–0.50)

## 2020-07-10 LAB — COMPREHENSIVE METABOLIC PANEL
ALT: 72 U/L — ABNORMAL HIGH (ref 0–44)
AST: 76 U/L — ABNORMAL HIGH (ref 15–41)
Albumin: 2.5 g/dL — ABNORMAL LOW (ref 3.5–5.0)
Alkaline Phosphatase: 37 U/L — ABNORMAL LOW (ref 38–126)
Anion gap: 10 (ref 5–15)
BUN: 37 mg/dL — ABNORMAL HIGH (ref 8–23)
CO2: 27 mmol/L (ref 22–32)
Calcium: 8 mg/dL — ABNORMAL LOW (ref 8.9–10.3)
Chloride: 110 mmol/L (ref 98–111)
Creatinine, Ser: 1.26 mg/dL — ABNORMAL HIGH (ref 0.61–1.24)
GFR, Estimated: >60 mL/min (ref >60)
Glucose, Bld: 197 mg/dL — ABNORMAL HIGH (ref 70–99)
Potassium: 4.3 mmol/L (ref 3.5–5.1)
Sodium: 147 mmol/L — ABNORMAL HIGH (ref 135–145)
Total Bilirubin: 1.1 mg/dL (ref 0.3–1.2)
Total Protein: 6.8 g/dL (ref 6.5–8.1)

## 2020-07-10 LAB — CBC
HCT: 46.6 % (ref 39.0–52.0)
Hemoglobin: 15.3 g/dL (ref 13.0–17.0)
MCH: 30.1 pg (ref 26.0–34.0)
MCHC: 32.8 g/dL (ref 30.0–36.0)
MCV: 91.6 fL (ref 80.0–100.0)
Platelets: 521 10*3/uL — ABNORMAL HIGH (ref 150–400)
RBC: 5.09 MIL/uL (ref 4.22–5.81)
RDW: 13.6 % (ref 11.5–15.5)
WBC: 5.3 10*3/uL (ref 4.0–10.5)
nRBC: 0 % (ref 0.0–0.2)

## 2020-07-10 LAB — MAGNESIUM: Magnesium: 2.8 mg/dL — ABNORMAL HIGH (ref 1.7–2.4)

## 2020-07-10 LAB — C-REACTIVE PROTEIN: CRP: 5.5 mg/dL — ABNORMAL HIGH (ref <1.0)

## 2020-07-10 MED ORDER — ENSURE ENLIVE PO LIQD: 237.0000 mL | Freq: Three times a day (TID) | ORAL | Status: DC

## 2020-07-10 MED ORDER — ADULT MULTIVITAMIN W/MINERALS CH
1.0000 | ORAL_TABLET | Freq: Every day | ORAL | Status: DC
  Administered 2020-07-11: 1 via ORAL
  Filled 2020-07-10: qty 1

## 2020-07-10 NOTE — Progress Notes (Signed)
Speech Language Pathology Treatment: Dysphagia  Patient Details Name: Jeffrey Torres MRN: [id number] DOB: 02-24-1946 Today's Date: 07/10/2020 Time: 0930-1010 SLP Time Calculation (min) (ACUTE ONLY): 40 min  Assessment / Plan / Recommendation Clinical Impression  Ptseen for ongoing assessment of toleration of diet; trials at breakfast meal. SLP upgraded diet consistency yesterday. Pt continues to have some confusion per MD note; distracted at times and required verbal and visual cues intermittently for follow through w/ tasks and for the need to sit fully upright to eat/drink at this meal(leaned to side, down in bed). Pt was verbal and followed basic instructions. Oriented to self, place and other general information re: self. Pt is HOH and has macular degeneration per chart. This can impact pt's overall engagement. Ptappears to present w/ functionalswallowing w/ No overt oropharyngeal phase dysphagia noted; he is best supported byfollowing general aspiration precautions(smallsips/bites; sitting fully upright w/ all oral intake).W/ general aspiration precautions implementedand followed during the po trials, No overt,clinical s/s of aspiration during po trials. Pt appears at reduced risk for aspiration following general aspiration precautions. During po trials, pt consumed all consistencies w/ no immediate overt coughing, decline in vocal quality, or change in respiratory presentation during/post trials. Instructed on Cup drinking for better oral control of the bolus.Oral phase appearedgrossly [facility] forbolus management, mastication, andpropulsion for A-P transfer for swallowing. Pt adequately managed the moresolid textured foods -- moistening of foods added to ease effort. Pt has full Dentition baseline. Oral clearing achieved w/ all trial consistenciesgivenTime and moistening or alternating foods/liquids. Pt asked for coffee which was provided and he fed self following precautions. Time spent  on education of aspiration precautions and sitting up for eating meals. Recommendcontinuea Mech Soft consistency diet w/ chopped/minced meats, moistened foods/gravies; Thin liquids via Cup. Recommend general aspiration precautions, Pills Wholevs Crushedin Puree for safer, easier swallowing per NSG.Support w/ setup and feeding at all meals as needed. Education given on Pills in Puree; food consistency; general aspiration precautions for room, chart. ST services can be available for further needs while admitted. Pt would benefit from 100% Supervision initially for follow through w/ aspiration precautions if returning Home vs SNF. NSG/MD updated. Handouts given. A Dietician consult could be beneficial for support.     HPI HPI: Pt is a 75 year old Caucasian male who lives by himself, IADL and ADL independent.  Patient is unvaccinated for Covid.  He had been in his usual state of health until Christmas Eve when he had complained of feeling unwell with cough and URI symptoms.  Patient did not seek any medical attention at the time.  As per daughter,since 5 days ago, patient was noted to have worsening altered mental status with poor oral intake.  Family able to monitor patient's activities using the camera.  Patient apparently had reported to family members that he had a fall in his bathtub within the past few days.  Fall were unwitnessed.  No known seizures.  Today, patient was noted to be significantly weak and altered in mental status hence family decided to bring patient into the emergency room.  Patient was reported to be hypoxic on presentation requiring nonrebreather mask.  He is currently being weaned down to nasal cannula.  Initial Covid test came back negative on the antigen test.  Chest x-ray however concerning for multifocal pneumonia.  PMH: includes GERD, Cancer - larynx ca, surgery and radiation(per Soft Tissue of Neck in 2018: Small area of soft tissue thickening enhancement right anterior  vocal  cord compatible with known neoplasm.  No adenopathy in the  neck), HOH, Macular degeneration of both eyes.      SLP Plan  All goals met       Recommendations  Diet recommendations: Dysphagia 3 (mechanical soft);Thin liquid Liquids provided via: Cup;No straw Medication Administration: Crushed with puree (vs Whole IF able to) Supervision: Patient able to self feed;Staff to assist with self feeding;Intermittent supervision to cue for compensatory strategies Compensations: Minimize environmental distractions;Slow rate;Small sips/bites;Lingual sweep for clearance of pocketing;Follow solids with liquid Postural Changes and/or Swallow Maneuvers: Seated upright 90 degrees;Out of bed for meals;Upright 30-60 min after meal                General recommendations:  (Dietician f/u as needed for support) Oral Care Recommendations: Oral care BID;Oral care before and after PO;Staff/trained caregiver to provide oral care Follow up Recommendations: None SLP Visit Diagnosis: Dysphagia, unspecified (R13.10) Plan: All goals met       GO                 [name], MS, CCC-SLP Speech Language Pathologist Rehab Services [phone] [facility] 07/10/2020, 2:21 PM

## 2020-07-10 NOTE — Hospital Course (Signed)
75 year old male at baseline independent, living by himself, presented to the hospital with confusion and progressive decline since Christmas.  At that time he was complaining of feeling unwell with cough, upper respiratory symptoms, did not seek care.  5 days ago he was having worsening mentation, decreased oral intake and had a fall at home.  Given progressive weakness and confusion family brought him to the emergency room.  He was found to be hypoxic, chest x-ray showed multifocal pneumonia, tested positive for Covid and he was admitted to the hospital.  Hospital course complicated by delirium

## 2020-07-10 NOTE — Care Management Important Message (Signed)
Important Message  Patient Details  Name: Jeffrey Torres MRN: [id number] Date of Birth: [birth year]   Medicare Important Message Given:  Yes     Shelbie Hutching, RN 07/10/2020, 12:23 PM

## 2020-07-10 NOTE — Progress Notes (Signed)
Pt agreed to take solumedrol after speaking with daughter

## 2020-07-10 NOTE — Progress Notes (Signed)
Initial Nutrition Assessment  DOCUMENTATION CODES:   Obesity unspecified  INTERVENTION:  Provide Ensure Enlive po TID, each supplement provides 350 kcal and 20 grams of protein. Patient prefers chocolate.  Provide Magic cup TID with meals, each supplement provides 290 kcal and 9 grams of protein. Patient prefers chocolate.  Provide MVI po daily.  NUTRITION DIAGNOSIS:   Increased nutrient needs related to catabolic illness ([id number]) as evidenced by estimated needs.  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   Met with patient at bedside. He reports his appetite is good and unchanged from baseline. However when RD asks how he is eating at meals he is unable to provide any details. No documentation in chart. Per RN patient is not eating well. RD discussed with patient importance of adequate intake. He is amenable to trying to eat more at meals and to try chocolate Ensure and Magic Cup.   Patient denies any weight loss. Per chart patient documented to be 100 kg (220.46 lbs). He was 105.3 kg on 03/05/2020, but this is not significant weight loss for time frame.  Medications reviewed and include: Solu-medrol 50 mg Q12hrs, Protonix, D5W at 50 mL/hr, remdesivir.  Labs reviewed: Sodium 147, BUN 37, Creatinine 1.26.  NUTRITION - FOCUSED PHYSICAL EXAM:  Flowsheet Row Most Recent Value  Orbital Region No depletion  Upper Arm Region No depletion  Thoracic and Lumbar Region No depletion  Buccal Region No depletion  Temple Region Mild depletion  Clavicle Bone Region No depletion  Clavicle and Acromion Bone Region No depletion  Scapular  Bone Region No depletion  Dorsal Hand No depletion  Patellar Region No depletion  Anterior Thigh Region No depletion  Posterior Calf Region Mild depletion  Edema (RD Assessment) None  Hair Reviewed  Eyes Reviewed  Mouth Reviewed  Skin Reviewed  Nails Reviewed     Diet Order:   Diet Order            DIET DYS 3 Room service appropriate? Yes with Assist; Fluid consistency: Thin  Diet effective now                EDUCATION NEEDS:   No education needs have been identified at this time  Skin:  Skin Assessment: Reviewed RN Assessment  Last BM:  Unknown  Height:   Ht Readings from Last 1 Encounters:  07/06/20 '5\' 10"'  (1.778 m)   Weight:   Wt Readings from Last 1 Encounters:  07/06/20 100 kg   BMI:  Body mass index is 31.63 kg/m.  Estimated Nutritional Needs:   Kcal:  2300-2500  Protein:  115-125 grams  Fluid:  2.3-2.5 L/day  Jacklynn Barnacle, MS, RD, LDN Pager number available on Amion

## 2020-07-10 NOTE — Progress Notes (Signed)
Pt refused all 8 and  10 pm meds. This RN tried to educate pt on the need to take his medication, pt continued to refuse. Family called and updated. Will continue to assess.

## 2020-07-10 NOTE — Progress Notes (Signed)
PROGRESS NOTE    Jeffrey Torres   YFV:[id number]  DOB: 1945-10-12  PCP: Birdie Sons, MD    DOA: 07/06/2020 LOS: 4   Brief Narrative   75 year old male at baseline independent, living by himself, presented to the hospital with confusion and progressive decline since Christmas.  At that time he was complaining of feeling unwell with cough, upper respiratory symptoms, did not seek care.  5 days ago he was having worsening mentation, decreased oral intake and had a fall at home.  Given progressive weakness and confusion family brought him to the emergency room.  He was found to be hypoxic, chest x-ray showed multifocal pneumonia, tested positive for Covid and he was admitted to the hospital.  Hospital course complicated by delirium     Assessment & Plan   Active Problems:   Pneumonia   Pneumonia   Acute respiratory failure with hypoxia secondary to COVID-19 pneumonia -present on admission. Slowly improving, down to 2 L/min supplemental oxygen today from 12 L on admission. Patient did not present to the hospital until after 2 weeks progressive symptoms. Completed 3 days of Levaquin -- Continue remdesivir, baricitinib, steroids -- Supplemental oxygen as needed to maintain O2 sat greater than 88% --Bronchodilator as needed --Mucinex   Acute metabolic encephalopathy -most likely secondary to infection and possibly hospital delirium, treat as above.  Family deny any prior history of dementia, patient lived independently.  MRI of the brain was unremarkable and showed generalized volume loss and chronic small vessel disease.   -- Delirium precautions   Hypernatremia -due to poor p.o. intake.  Improving with D5W. --Continue D5W at 50 cc/h -- Daily BMP to monitor -- Encourage oral intake especially water   Acute kidney injury -due to poor p.o. intake and dehydration, improved with fluids.  Continue maintenance fluids for now given poor oral intake.  History of squamous cell  cancer of the larynx -no acute issues.  Outpatient follow-up.  Essential hypertension -chronic, stable.  Continue Cardizem   Obesity: Body mass index is 31.63 kg/m.  Complicates overall care and prognosis.  DVT prophylaxis: SCDs Start: 07/06/20 1309 SCDs Start: 07/06/20 1258   Diet:  Diet Orders (From admission, onward)    Start     Ordered   07/09/20 1534  DIET DYS 3 Room service appropriate? Yes with Assist; Fluid consistency: Thin  Diet effective now       Comments: MINCED meats w/ gravies; NO STRAWS!!  Ensure TID meals.  Question Answer Comment  Room service appropriate? Yes with Assist   Fluid consistency: Thin      07/09/20 1534            Code Status: Full Code    Subjective 07/10/20    Patient seen this morning on rounds.  He says that he wants a bath.  He denies pain or discomfort, shortness of breath, nausea vomiting or other acute complaints.  Says he feels okay.  Nursing report patient frequently takes off his nasal cannula and oxygen sat will drop to 86 to 88% on room air.   Disposition Plan & Communication   Status is: Inpatient  Remains inpatient appropriate because:IV treatments appropriate due to intensity of illness or inability to take PO   Dispo: The patient is from: Home              Anticipated d/c is to: Home              Anticipated d/c date is: 1 day  Patient currently is not medically stable to d/c.   Family Communication: None at bedside, will attempt to call wife this afternoon   Consults, Procedures, Significant Events   Consultants:   None  Procedures:   None  Antimicrobials:  Anti-infectives (From admission, onward)   Start     Dose/Rate Route Frequency Ordered Stop   07/08/20 1000  remdesivir 100 mg in sodium chloride 0.9 % 100 mL IVPB  Status:  Discontinued       "Followed by" Linked Group Details   100 mg 200 mL/hr over 30 Minutes Intravenous  Once 07/07/20 1017 07/07/20 1017   07/08/20 1000  remdesivir  100 mg in sodium chloride 0.9 % 100 mL IVPB       "Followed by" Linked Group Details   100 mg 200 mL/hr over 30 Minutes Intravenous Daily 07/07/20 1018 07/12/20 0959   07/07/20 1200  vancomycin (VANCOREADY) IVPB 1250 mg/250 mL  Status:  Discontinued        1,250 mg 166.7 mL/hr over 90 Minutes Intravenous Every 24 hours 07/06/20 1344 07/07/20 1130   07/07/20 1200  vancomycin (VANCOREADY) IVPB 1500 mg/300 mL  Status:  Discontinued        1,500 mg 150 mL/hr over 120 Minutes Intravenous Every 24 hours 07/07/20 1130 07/08/20 1600   07/07/20 1130  remdesivir 100 mg in sodium chloride 0.9 % 100 mL IVPB  Status:  Discontinued       "Followed by" Linked Group Details   100 mg 200 mL/hr over 30 Minutes Intravenous  Once 07/07/20 1017 07/07/20 1017   07/07/20 1100  remdesivir 200 mg in sodium chloride 0.9% 250 mL IVPB       "Followed by" Linked Group Details   200 mg 580 mL/hr over 30 Minutes Intravenous Once 07/07/20 1018 07/07/20 1350   07/06/20 1400  levofloxacin (LEVAQUIN) IVPB 750 mg  Status:  Discontinued        750 mg 100 mL/hr over 90 Minutes Intravenous Every 24 hours 07/06/20 1338 07/08/20 1601   07/06/20 1330  levofloxacin (LEVAQUIN) IVPB 750 mg  Status:  Discontinued        750 mg 100 mL/hr over 90 Minutes Intravenous  Once 07/06/20 1309 07/06/20 1338   07/06/20 1315  vancomycin (VANCOCIN) IVPB 1000 mg/200 mL premix  Status:  Discontinued        1,000 mg 200 mL/hr over 60 Minutes Intravenous  Once 07/06/20 1309 07/06/20 1334   07/06/20 1100  vancomycin (VANCOCIN) IVPB 1000 mg/200 mL premix        1,000 mg 200 mL/hr over 60 Minutes Intravenous  Once 07/06/20 1046 07/06/20 2111   07/06/20 1045  aztreonam (AZACTAM) 2 g in sodium chloride 0.9 % 100 mL IVPB        2 g 200 mL/hr over 30 Minutes Intravenous  Once 07/06/20 1035 07/06/20 1253   07/06/20 1045  metroNIDAZOLE (FLAGYL) IVPB 500 mg        500 mg 100 mL/hr over 60 Minutes Intravenous  Once 07/06/20 1035 07/06/20 1425   07/06/20  1045  vancomycin (VANCOCIN) IVPB 1000 mg/200 mL premix  Status:  Discontinued        1,000 mg 200 mL/hr over 60 Minutes Intravenous  Once 07/06/20 1035 07/07/20 1130         Objective   Vitals:   07/10/20 0542 07/10/20 0727 07/10/20 1206 07/10/20 1300  BP: 138/77 (!) 148/91 (!) 143/71   Pulse: 64 63 66   Resp: 17 20 18  Temp: 98.9 F (37.2 C) 98.4 F (36.9 C) 98.6 F (37 C)   TempSrc:      SpO2: 96% 90% 97% 90%  Weight:      Height:        Intake/Output Summary (Last 24 hours) at 07/10/2020 1440 Last data filed at 07/10/2020 0258 Gross per 24 hour  Intake 1228.56 ml  Output 700 ml  Net 528.56 ml   Filed Weights   07/06/20 1012  Weight: 100 kg    Physical Exam:  General exam: awake, alert, no acute distress HEENT: moist mucus membranes, hearing grossly normal  Respiratory system: Decreased breath sounds, normal respiratory effort on 2 L/min nasal cannula oxygen. Cardiovascular system: normal S1/S2, RRR, no pedal edema.   Gastrointestinal system: soft, NT, ND Central nervous system: no gross focal neurologic deficits, normal speech Extremities: moves all, no edema, normal tone  Labs   Data Reviewed: I have personally reviewed following labs and imaging studies  CBC: Recent Labs  Lab 07/06/20 1022 07/07/20 0353 07/08/20 0355 07/09/20 0621 07/10/20 0529  WBC 7.7 6.3 8.2 11.3* 5.3  NEUTROABS 6.4 5.3  --   --   --   HGB 15.8 14.4 16.9 16.3 15.3  HCT 48.1 44.4 51.8 49.3 46.6  MCV 93.2 92.9 91.8 90.5 91.6  PLT 533* 524* 607* 556* 740*   Basic Metabolic Panel: Recent Labs  Lab 07/06/20 1022 07/07/20 0353 07/08/20 0355 07/09/20 0621 07/10/20 0529  NA 149* 148* 150* 147* 147*  K 3.6 3.3* 3.3* 3.5 4.3  CL 104 112* 112* 109 110  CO2 30 25 24 26 27   GLUCOSE 151* 152* 172* 176* 197*  BUN 37* 33* 42* 41* 37*  CREATININE 1.31* 1.03 1.20 1.26* 1.26*  CALCIUM 8.0* 7.3* 7.8* 8.0* 8.0*  MG  --   --  3.0*  --  2.8*   GFR: Estimated Creatinine Clearance:  61.9 mL/min (A) (by C-G formula based on SCr of 1.26 mg/dL (H)). Liver Function Tests: Recent Labs  Lab 07/06/20 1022 07/08/20 0355 07/09/20 0621 07/10/20 0529  AST 38 41 58* 76*  ALT 33 31 42 72*  ALKPHOS 45 35* 36* 37*  BILITOT 1.3* 1.0 1.0 1.1  PROT 7.9 7.0 6.9 6.8  ALBUMIN 2.7* 2.4* 2.5* 2.5*   No results for input(s): LIPASE, AMYLASE in the last 168 hours. No results for input(s): AMMONIA in the last 168 hours. Coagulation Profile: Recent Labs  Lab 07/06/20 1022 07/07/20 0353  INR 1.2 1.3*   Cardiac Enzymes: No results for input(s): CKTOTAL, CKMB, CKMBINDEX, TROPONINI in the last 168 hours. BNP (last 3 results) No results for input(s): PROBNP in the last 8760 hours. HbA1C: No results for input(s): HGBA1C in the last 72 hours. CBG: No results for input(s): GLUCAP in the last 168 hours. Lipid Profile: No results for input(s): CHOL, HDL, LDLCALC, TRIG, CHOLHDL, LDLDIRECT in the last 72 hours. Thyroid Function Tests: No results for input(s): TSH, T4TOTAL, FREET4, T3FREE, THYROIDAB in the last 72 hours. Anemia Panel: No results for input(s): VITAMINB12, FOLATE, FERRITIN, TIBC, IRON, RETICCTPCT in the last 72 hours. Sepsis Labs: Recent Labs  Lab 07/06/20 1022 07/07/20 0353  PROCALCITON 0.17 0.10  LATICACIDVEN 2.1* 1.7    Recent Results (from the past 240 hour(s))  Blood Culture (routine x 2)     Status: None (Preliminary result)   Collection Time: 07/06/20 10:22 AM   Specimen: BLOOD  Result Value Ref Range Status   Specimen Description BLOOD RIGHT [facility]  Final   Special  Requests   Final    BOTTLES DRAWN AEROBIC AND ANAEROBIC Blood Culture adequate volume   Culture   Final    NO GROWTH 4 DAYS Performed at [facility], [address] [place], [place] [address]    Report Status PENDING  Incomplete  Blood Culture (routine x 2)     Status: None (Preliminary result)   Collection Time: 07/06/20 10:22 AM   Specimen: BLOOD  Result Value Ref Range Status    Specimen Description BLOOD LAC  Final   Special Requests   Final    BOTTLES DRAWN AEROBIC AND ANAEROBIC Blood Culture adequate volume   Culture   Final    NO GROWTH 4 DAYS Performed at [facility], [address], [place], [place] [address]    Report Status PENDING  Incomplete  Resp Panel by RT-PCR (Flu A&B, Covid) Nasopharyngeal Swab     Status: Abnormal   Collection Time: 07/06/20 10:22 AM   Specimen: Nasopharyngeal Swab; Nasopharyngeal(NP) swabs in vial transport medium  Result Value Ref Range Status   SARS Coronavirus 2 by RT PCR POSITIVE (A) NEGATIVE Final    Comment: RESULT CALLED TO, READ BACK BY AND VERIFIED WITH: CHRISTINE HALLAS 07/06/20 @1400  BY ACR (NOTE) SARS-CoV-2 target nucleic acids are DETECTED.  The SARS-CoV-2 RNA is generally detectable in upper respiratory specimens during the acute phase of infection. Positive results are indicative of the presence of the identified virus, but do not rule out bacterial infection or co-infection with other pathogens not detected by the test. Clinical correlation with patient history and other diagnostic information is necessary to determine patient infection status. The expected result is Negative.  Fact Sheet for Patients: EntrepreneurPulse.com.au  Fact Sheet for Healthcare Providers: IncredibleEmployment.be  This test is not yet approved or cleared by the [place] FDA and  has been authorized for detection and/or diagnosis of SARS-CoV-2 by FDA under an Emergency Use Authorization (EUA).  This EUA will remain in effect (meaning this test can  be used) for the duration of  the COVID-19 declaration under Section 564(b)(1) of the Act, 21 U.S.C. section 360bbb-3(b)(1), unless the authorization is terminated or revoked sooner.     Influenza A by PCR NEGATIVE NEGATIVE Final   Influenza B by PCR NEGATIVE NEGATIVE Final    Comment: (NOTE) The Xpert Xpress SARS-CoV-2/FLU/RSV  plus assay is intended as an aid in the diagnosis of influenza from Nasopharyngeal swab specimens and should not be used as a sole basis for treatment. Nasal washings and aspirates are unacceptable for Xpert Xpress SARS-CoV-2/FLU/RSV testing.  Fact Sheet for Patients: EntrepreneurPulse.com.au  Fact Sheet for Healthcare Providers: IncredibleEmployment.be  This test is not yet approved or cleared by the [place] FDA and has been authorized for detection and/or diagnosis of SARS-CoV-2 by FDA under an Emergency Use Authorization (EUA). This EUA will remain in effect (meaning this test can be used) for the duration of the COVID-19 declaration under Section 564(b)(1) of the Act, 21 U.S.C. section 360bbb-3(b)(1), unless the authorization is terminated or revoked.  Performed at [facility], [address], [place], [place] [address]   Urine culture     Status: None   Collection Time: 07/07/20  3:53 AM   Specimen: In/Out Cath Urine  Result Value Ref Range Status   Specimen Description   Final    IN/OUT CATH URINE Performed at [facility], [address] [place], [place] [address]    Special Requests   Final    NONE  Performed at [facility], [address], [place], [place] [address]    Culture   Final    NO GROWTH Performed at [facility], [place] [address], [place], [place] [address]    Report Status 07/08/2020 FINAL  Final  MRSA PCR Screening     Status: None   Collection Time: 07/08/20  2:13 PM   Specimen: Nasopharyngeal  Result Value Ref Range Status   MRSA by PCR NEGATIVE NEGATIVE Final    Comment:        The GeneXpert MRSA Assay (FDA approved for NASAL specimens only), is one component of a comprehensive MRSA colonization surveillance program. It is not intended to diagnose MRSA infection nor to guide or monitor treatment for MRSA infections. Performed at [facility], [address] [place], [place] [address]       Imaging Studies   MR BRAIN WO CONTRAST  Result Date: 07/09/2020 CLINICAL DATA:  Encephalopathy EXAM: MRI HEAD WITHOUT CONTRAST TECHNIQUE: Multiplanar, multiecho pulse sequences of the brain and surrounding structures were obtained without intravenous contrast. COMPARISON:  None. FINDINGS: Brain: No acute infarct, mass effect or extra-axial collection. No acute or chronic hemorrhage. There is multifocal hyperintense T2-weighted signal within the white matter. Generalized volume loss without a clear lobar predilection. The midline structures are normal. Vascular: Major flow voids are preserved. Skull and upper cervical spine: Normal calvarium and skull base. Visualized upper cervical spine and soft tissues are normal. Sinuses/Orbits:Trace fluid in the sphenoid sinus. There are bilateral lens replacements. IMPRESSION: 1. No acute intracranial abnormality. 2. Generalized volume loss and findings of chronic small vessel disease. Electronically Signed   By: Ulyses Jarred M.D.   On: 07/09/2020 23:54     Medications   Scheduled Meds: . aspirin  81 mg Oral Daily  . baricitinib  4 mg Oral Daily  . diltiazem  120 mg Oral Daily  . enoxaparin (LOVENOX) injection  50 mg Subcutaneous Q24H  . fluticasone  2 spray Each Nare [id number]  . guaiFENesin  600 mg Oral BID  . methylPREDNISolone (SOLU-MEDROL) injection  50 mg Intravenous Q12H  . metoprolol tartrate  5 mg Intravenous Q6H  . pantoprazole  40 mg Oral Daily  . tamsulosin  0.4 mg Oral QPC supper   Continuous Infusions: . sodium chloride 250 mL (07/09/20 1035)  . dextrose 50 mL/hr at 07/10/20 0258  . remdesivir 100 mg in NS 100 mL 100 mg (07/10/20 0756)       LOS: 4 days    Time spent: 30 minutes with greater than 50% spent at bedside and coronation of care    Ezekiel Slocumb, DO [facility]  07/10/2020, 2:40 PM    If 7PM-7AM, please contact night-coverage. How to contact the [facility]  Attending or Consulting provider [employer] or covering provider during after hours [place], for this patient?    1. Check the care team in [facility] and look for a) attending/consulting TRH provider listed and b) the [facility] team listed 2. Log into [email or web address] and use [place]'s universal password to access. If you do not have the password, please contact the hospital operator. 3. Locate the [facility] provider you are looking for under [facility] and page to a number that you can be directly reached. 4. If you still have difficulty reaching the provider, please page the [facility] (Director on Call) for the Hospitalists listed on amion for assistance.

## 2020-07-10 NOTE — Progress Notes (Signed)
Pt refuses antihypertensive medications at 0000 and 0600.

## 2020-07-11 DIAGNOSIS — U071 COVID-19: Secondary | ICD-10-CM | POA: Diagnosis not present

## 2020-07-11 DIAGNOSIS — J9601 Acute respiratory failure with hypoxia: Secondary | ICD-10-CM | POA: Diagnosis not present

## 2020-07-11 DIAGNOSIS — N179 Acute kidney failure, unspecified: Secondary | ICD-10-CM

## 2020-07-11 DIAGNOSIS — J1282 Pneumonia due to coronavirus disease 2019: Secondary | ICD-10-CM

## 2020-07-11 DIAGNOSIS — G9341 Metabolic encephalopathy: Secondary | ICD-10-CM | POA: Diagnosis not present

## 2020-07-11 LAB — FIBRIN DERIVATIVES D-DIMER (ARMC ONLY): Fibrin derivatives D-dimer (ARMC): 918.63 ng/mL (FEU) — ABNORMAL HIGH (ref 0.00–499.00)

## 2020-07-11 LAB — COMPREHENSIVE METABOLIC PANEL
ALT: 134 U/L — ABNORMAL HIGH (ref 0–44)
AST: 123 U/L — ABNORMAL HIGH (ref 15–41)
Albumin: 2.7 g/dL — ABNORMAL LOW (ref 3.5–5.0)
Alkaline Phosphatase: 38 U/L (ref 38–126)
Anion gap: 9 (ref 5–15)
BUN: 34 mg/dL — ABNORMAL HIGH (ref 8–23)
CO2: 25 mmol/L (ref 22–32)
Calcium: 8.1 mg/dL — ABNORMAL LOW (ref 8.9–10.3)
Chloride: 106 mmol/L (ref 98–111)
Creatinine, Ser: 1.08 mg/dL (ref 0.61–1.24)
GFR, Estimated: >60 mL/min (ref >60)
Glucose, Bld: 202 mg/dL — ABNORMAL HIGH (ref 70–99)
Potassium: 4.4 mmol/L (ref 3.5–5.1)
Sodium: 140 mmol/L (ref 135–145)
Total Bilirubin: 1.3 mg/dL — ABNORMAL HIGH (ref 0.3–1.2)
Total Protein: 6.9 g/dL (ref 6.5–8.1)

## 2020-07-11 LAB — CULTURE, BLOOD (ROUTINE X 2)
Culture: NO GROWTH
Culture: NO GROWTH
Special Requests: ADEQUATE
Special Requests: ADEQUATE

## 2020-07-11 LAB — CBC
HCT: 46.6 % (ref 39.0–52.0)
Hemoglobin: 15.4 g/dL (ref 13.0–17.0)
MCH: 29.8 pg (ref 26.0–34.0)
MCHC: 33 g/dL (ref 30.0–36.0)
MCV: 90.3 fL (ref 80.0–100.0)
Platelets: 469 10*3/uL — ABNORMAL HIGH (ref 150–400)
RBC: 5.16 MIL/uL (ref 4.22–5.81)
RDW: 13.4 % (ref 11.5–15.5)
WBC: 6.5 10*3/uL (ref 4.0–10.5)
nRBC: 0 % (ref 0.0–0.2)

## 2020-07-11 LAB — C-REACTIVE PROTEIN: CRP: 2.9 mg/dL — ABNORMAL HIGH (ref <1.0)

## 2020-07-11 MED ORDER — DILTIAZEM HCL ER COATED BEADS 120 MG PO CP24: 120.0000 mg | ORAL_CAPSULE | Freq: Every day | ORAL | 0 refills | Status: DC

## 2020-07-11 MED ORDER — ALBUTEROL SULFATE HFA 108 (90 BASE) MCG/ACT IN AERS: 2.0000 | INHALATION_SPRAY | Freq: Four times a day (QID) | RESPIRATORY_TRACT | 0 refills | Status: DC | PRN

## 2020-07-11 MED ORDER — GUAIFENESIN ER 600 MG PO TB12: 600.0000 mg | ORAL_TABLET | Freq: Two times a day (BID) | ORAL | 0 refills | Status: AC

## 2020-07-11 MED ORDER — PREDNISONE 10 MG PO TABS: ORAL_TABLET | ORAL | 0 refills | Status: AC

## 2020-07-11 MED ORDER — ENSURE ENLIVE PO LIQD: 237.0000 mL | Freq: Three times a day (TID) | ORAL | 12 refills | Status: DC

## 2020-07-11 MED ORDER — TAMSULOSIN HCL 0.4 MG PO CAPS: 0.4000 mg | ORAL_CAPSULE | Freq: Every day | ORAL | 1 refills | Status: DC

## 2020-07-11 NOTE — Progress Notes (Signed)
Patient's wife Gabriel Cirri updated via phone per request.

## 2020-07-11 NOTE — Progress Notes (Signed)
Patient's wife Gabriel Cirri to pick patient up for discharge

## 2020-07-11 NOTE — Progress Notes (Signed)
Patient verbalized understanding of all discharge instructions including follow up appts.

## 2020-07-11 NOTE — Progress Notes (Signed)
Physical Therapy Treatment Patient Details Name: Jeffrey Torres MRN: [id number] DOB: 06-25-1946 Today's Date: 07/11/2020    History of Present Illness Patient is a 75 year old Caucasian male who lives by himself, IADL and ADL independent.  Patient is unvaccinated for Covid.  He had been in his usual state of health until Christmas Eve when he had complained of feeling unwell with cough and URI symptoms.  Patient did not seek any medical attention at the time.  As per daughter,since 5 days ago, patient was noted to have worsening altered mental status with poor oral intake.  Family able to monitor patient's activities using the camera.  Patient apparently had reported to family members that he had a fall in his bathtub within the past few days.  Fall were unwitnessed.  No known seizures.  Today, patient was noted to be significantly weak and altered in mental status hence family decided to bring patient into the emergency room.  Patient was reported to be hypoxic on presentation requiring nonrebreather mask.  He is currently being weaned down to nasal cannula.  Initial Covid test came back negative on the antigen test.  Chest x-ray however concerning for multifocal pneumonia.  COVID +    PT Comments    Pt in chair before and after session.  Stood with cues and is able to ambulate 2 laps in room then progress to 2 laps on unit with RW and min guard/supervision.  No LOB or buckling noted.  On room air.  Pt stated he has several walkers at home and was encouraged to use one upon dicharge.  Voiced understanding.    Follow Up Recommendations  Home health PT;Supervision/Assistance - 24 hour     Equipment Recommendations       Recommendations for Other Services       Precautions / Restrictions Precautions Precautions: Fall Restrictions Weight Bearing Restrictions: No    Mobility  Bed Mobility               General bed mobility comments: in chair before and after session but anticipate  no difficulty given gait quality  Transfers Overall transfer level: Needs assistance Equipment used: Rolling walker (2 wheeled) Transfers: Sit to/from Stand Sit to Stand: Supervision            Ambulation/Gait Ambulation/Gait assistance: Min guard Gait Distance (Feet): 350 Feet Assistive device: Standard walker Gait Pattern/deviations: Step-through pattern Gait velocity: decreased       Stairs             Wheelchair Mobility    Modified Rankin (Stroke Patients Only)       Balance Overall balance assessment: Needs assistance Sitting-balance support: Feet supported Sitting balance-Leahy Scale: Good     Standing balance support: Bilateral upper extremity supported Standing balance-Leahy Scale: Good                              Cognition Arousal/Alertness: Awake/alert Behavior During Therapy: WFL for tasks assessed/performed Overall Cognitive Status: Within Functional Limits for tasks assessed                                 General Comments: follows commands with cues      Exercises      General Comments        Pertinent Vitals/Pain Pain Assessment: No/denies pain    Home Living  Prior Function            PT Goals (current goals can now be found in the care plan section) Progress towards PT goals: Progressing toward goals    Frequency    Min 2X/week      PT Plan Current plan remains appropriate    Co-evaluation              AM-PAC PT "6 Clicks" Mobility   Outcome Measure  Help needed turning from your back to your side while in a flat bed without using bedrails?: None Help needed moving from lying on your back to sitting on the side of a flat bed without using bedrails?: None Help needed moving to and from a bed to a chair (including a wheelchair)?: A Little Help needed standing up from a chair using your arms (e.g., wheelchair or bedside chair)?: A Little Help  needed to walk in hospital room?: A Little Help needed climbing 3-5 steps with a railing? : A Little 6 Click Score: 20    End of Session Equipment Utilized During Treatment: Gait belt Activity Tolerance: Patient tolerated treatment well Patient left: in chair;with call bell/phone within reach Nurse Communication: Mobility status       Time: 1140-1200 PT Time Calculation (min) (ACUTE ONLY): 20 min  Charges:  $Gait Training: 8-22 mins                    Chesley Noon, PTA 07/11/20, 12:15 PM

## 2020-07-11 NOTE — Discharge Summary (Addendum)
Physician Discharge Summary  Jeffrey Torres:[id number] DOB: 05/20/1946 DOA: 07/06/2020  PCP: Birdie Sons, MD  Admit date: 07/06/2020 Discharge date: 07/31/2020  Admitted From: home Disposition:  home  Recommendations for Outpatient Follow-up:  1. Follow up with PCP in 1-2 weeks 2. Please obtain BMP/CBC in one week   Home Health: PT, OT, RN  Equipment/Devices: rollomg walker  Discharge Condition: Stable  CODE STATUS: Full  Diet recommendation: Heart Healthy    Discharge Diagnoses: Principal Problem:   Acute respiratory failure with hypoxia ([place]) Active Problems:   Pneumonia due to COVID-19 virus   Acute metabolic encephalopathy   AKI (acute kidney injury) ([place])   Hypernatremia   Essential hypertension    Summary of HPI and Hospital Course:  75 year old male at baseline independent, living by himself, presented to the hospital with confusion and progressive decline since Christmas.  At that time he was complaining of feeling unwell with cough, upper respiratory symptoms, did not seek care.  5 days ago he was having worsening mentation, decreased oral intake and had a fall at home.  Given progressive weakness and confusion family brought him to the emergency room.  He was found to be hypoxic, chest x-ray showed multifocal pneumonia, tested positive for Covid and he was admitted to the hospital.  Hospital course complicated by delirium     Acute respiratory failure with hypoxia secondary to COVID-19 pneumonia -present on admission with requirement of 12 L/min with progressive respiratory symptoms including shortness of breath.  Patient did not present to the hospital until after 2 weeks progressive symptoms.                 Completed 3 days of Levaquin for suspected secondary bacterial pneumonia.  Treated with remdesivir, baricitinib, steroids.   Supplemental oxygen was able to be weaned.   Discharged with 4 day prednisone taper 40-30-20-10 mg.  Supportive care with  bronchodilator, mucolytic, antitussives.  Severe sepsis present on admission with - tachycardia, tachypnea, encephalopathy and AKI reflecting organ dysfunction, in the setting of Covid-19 mutlifocal pneumonia meets criteria of severe sepsis.  Sepsis physiology improved with mgmt of above.  Acute metabolic encephalopathy -most likely secondary to sepsis/infection given history of worsening confusion prior to admission.  Treat as above.  Family deny any prior history of dementia, patient lived independently prior to admission. MRI of the brain was unremarkable and showed generalized volume loss and chronic small vessel disease.   Delirium precautions were followed Mentation improving gradually.  Hypernatremia -due to poor p.o. intake.  Resolved with D5W. Patient encouraged to drink plenty of water.  PCP follow up and BMP to monitor.  Acute kidney injury -POA with Cr 1.31, baseline around 1.  Secondary to poor p.o. intake and dehydration, improved with fluids.    History of squamous cell cancer of the larynx -no acute issues. Outpatient follow-up.  Essential hypertension -chronic, stable.  Continue Cardizem  Obesity: Body mass index is 31.63 kg/m.  Complicates overall care and prognosis.      Discharge Instructions   Discharge Instructions    Call MD for:   Complete by: As directed    Progressively worsening shortness of breath   Call MD for:  extreme fatigue   Complete by: As directed    Call MD for:  persistant dizziness or light-headedness   Complete by: As directed    Call MD for:  persistant nausea and vomiting   Complete by: As directed    Call MD for:  severe uncontrolled  pain   Complete by: As directed    Call MD for:  temperature >100.4   Complete by: As directed    Diet - low sodium heart healthy   Complete by: As directed    Discharge instructions   Complete by: As directed    Take prednisone (steroid) taper over next four days as prescribed.  Please  increase your physical activity slowly, if you feel too weak or tired, then you should rest. Home health therapy will work with you on rebuilding your strength.  Until you feel back to normal strength, be very careful when you get up to walk around.   Increase activity slowly   Complete by: As directed      Allergies as of 07/11/2020      Reactions   Penicillins Anaphylaxis, Swelling   Did it involve swelling of the face/tongue/throat, SOB, or low BP? Yes Did it involve sudden or severe rash/hives, skin peeling, or any reaction on the inside of your mouth or nose? No Did you need to seek medical attention at a hospital or doctor's office? No When did it last happen?10 + years ago If all above answers are "NO", may proceed with cephalosporin use.      Medication List    TAKE these medications   albuterol 108 (90 Base) MCG/ACT inhaler Commonly known as: VENTOLIN HFA Inhale 2 puffs into the lungs every 6 (six) hours as needed for wheezing or shortness of breath.   aspirin 81 MG tablet Take 81 mg by mouth daily.   diltiazem 120 MG 24 hr capsule Commonly known as: CARDIZEM CD Take 1 capsule (120 mg total) by mouth daily.   feeding supplement Liqd Take 237 mLs by mouth 3 (three) times daily between meals.   fluticasone 50 MCG/ACT nasal spray Commonly known as: FLONASE Place 2 sprays into both nostrils every morning.   hydrochlorothiazide 25 MG tablet Commonly known as: HYDRODIURIL Take 25 mg by mouth daily.   omeprazole 20 MG capsule Commonly known as: PRILOSEC Take 20 mg by mouth daily before breakfast.   PRESERVISION AREDS 2 PO Take 2 capsules by mouth every morning.   tamsulosin 0.4 MG Caps capsule Commonly known as: FLOMAX Take 1 capsule (0.4 mg total) by mouth daily after supper.     ASK your doctor about these medications   guaiFENesin 600 MG 12 hr tablet Commonly known as: MUCINEX Take 1 tablet (600 mg total) by mouth 2 (two) times daily for 7 days. Ask  about: Should I take this medication?   predniSONE 10 MG tablet Commonly known as: DELTASONE Take 4 tablets (40 mg total) by mouth daily for 1 day, THEN 3 tablets (30 mg total) daily for 1 day, THEN 2 tablets (20 mg total) daily for 1 day, THEN 1 tablet (10 mg total) daily for 1 day. Start taking on: July 11, 2020 Ask about: Should I take this medication?       Follow-up Information    Birdie Sons, MD. Schedule an appointment as soon as possible for a visit in 1 week(s).   Specialty: Family Medicine Why: Covid hospital admission follow up Contact information: [address] [place] [address] [phone]        Kate Sable, MD .   Specialties: Cardiology, Radiology Contact information: [place] [address] [place], [facility] Follow up.   Specialty: Home Health Services Why: [place] will  be calling you to schedule an initial visit.               Allergies  Allergen Reactions  . Penicillins Anaphylaxis and Swelling    Did it involve swelling of the face/tongue/throat, SOB, or low BP? Yes Did it involve sudden or severe rash/hives, skin peeling, or any reaction on the inside of your mouth or nose? No Did you need to seek medical attention at a hospital or doctor's office? No When did it last happen?10 + years ago If all above answers are "NO", may proceed with cephalosporin use.     Consultations:  None   Procedures/Studies: DG Chest 2 View  Result Date: 07/22/2020 CLINICAL DATA:  COVID-19 pneumonia, hypoxia, weakness EXAM: CHEST - 2 VIEW COMPARISON:  07/06/2020 FINDINGS: Frontal and lateral views of the chest demonstrate a stable cardiac silhouette. Multifocal bilateral airspace disease is again identified, with slight improvement since previous study. No effusion or pneumothorax. No acute bony abnormalities. IMPRESSION: 1. Slight improvement in multifocal  bilateral COVID-19 pneumonia. Electronically Signed   By: Randa Ngo M.D.   On: 07/22/2020 19:51   CT Head Wo Contrast  Result Date: 07/06/2020 CLINICAL DATA:  Mental status change. EXAM: CT HEAD WITHOUT CONTRAST TECHNIQUE: Contiguous axial images were obtained from the base of the skull through the vertex without intravenous contrast. COMPARISON:  None. FINDINGS: Brain: No evidence of acute infarction, hemorrhage, hydrocephalus, extra-axial collection or mass lesion/mass effect. Vascular: No hyperdense vessel or unexpected calcification. Skull: Normal. Negative for fracture or focal lesion. Sinuses/Orbits: There is a small amount of fluid posteriorly in the sphenoid sinuses. Opacification of scattered ethmoid air cells identified. Other: None. IMPRESSION: 1. No acute intracranial abnormality. 2. Sinus disease as above. Electronically Signed   By: Dorise Bullion III M.D   On: 07/06/2020 12:38   MR BRAIN WO CONTRAST  Result Date: 07/09/2020 CLINICAL DATA:  Encephalopathy EXAM: MRI HEAD WITHOUT CONTRAST TECHNIQUE: Multiplanar, multiecho pulse sequences of the brain and surrounding structures were obtained without intravenous contrast. COMPARISON:  None. FINDINGS: Brain: No acute infarct, mass effect or extra-axial collection. No acute or chronic hemorrhage. There is multifocal hyperintense T2-weighted signal within the white matter. Generalized volume loss without a clear lobar predilection. The midline structures are normal. Vascular: Major flow voids are preserved. Skull and upper cervical spine: Normal calvarium and skull base. Visualized upper cervical spine and soft tissues are normal. Sinuses/Orbits:Trace fluid in the sphenoid sinus. There are bilateral lens replacements. IMPRESSION: 1. No acute intracranial abnormality. 2. Generalized volume loss and findings of chronic small vessel disease. Electronically Signed   By: Ulyses Jarred M.D.   On: 07/09/2020 23:54   DG Chest Port 1 View  Result  Date: 07/06/2020 CLINICAL DATA:  Possible sepsis. EXAM: PORTABLE CHEST 1 VIEW COMPARISON:  None. FINDINGS: Mild cardiomegaly. Diffuse bilateral airspace opacities, most likely acute, most likely pneumonia. No pleural effusion or pneumothorax is seen. Osseous structures about the chest are unremarkable. IMPRESSION: 1. Diffuse bilateral airspace opacities, most likely acute, most likely multifocal pneumonia. 2. Mild cardiomegaly. Electronically Signed   By: Franki Cabot M.D.   On: 07/06/2020 10:56       Subjective: Pt reports feeling well, ready to go home.  Denies SOB, CP, F/C, N/V or other complaints.  Discusses working with [facility] and importance of follow up.   Discharge Exam: Vitals:   07/11/20 0814 07/11/20 1209  BP: (!) 166/81 120/80  Pulse: (!) 58 68  Resp: 16 15  Temp: 98.2 F (  36.8 C) 98.3 F (36.8 C)  SpO2: 92% 90%   Vitals:   07/10/20 2315 07/11/20 0414 07/11/20 0814 07/11/20 1209  BP: 137/74 138/84 (!) 166/81 120/80  Pulse: (!) 58 63 (!) 58 68  Resp: 18 16 16 15   Temp: (!) 97.5 F (36.4 C) (!) 97.3 F (36.3 C) 98.2 F (36.8 C) 98.3 F (36.8 C)  TempSrc:      SpO2: 92% 93% 92% 90%  Weight:      Height:        General: Pt is alert, awake, not in acute distress Cardiovascular: RRR, S1/S2 +, no rubs, no gallops Respiratory: CTA bilaterally diminished, no wheezing, no rhonchi Abdominal: Soft, NT, ND, bowel sounds + Extremities: no edema, no cyanosis    The results of significant diagnostics from this hospitalization (including imaging, microbiology, ancillary and laboratory) are listed below for reference.     Microbiology: No results found for this or any previous visit (from the past 240 hour(s)).   Labs: BNP (last 3 results) No results for input(s): BNP in the last 8760 hours. Basic Metabolic Panel: No results for input(s): NA, K, CL, CO2, GLUCOSE, BUN, CREATININE, CALCIUM, MG, PHOS in the last 168 hours. Liver Function Tests: No results for input(s): AST,  ALT, ALKPHOS, BILITOT, PROT, ALBUMIN in the last 168 hours. No results for input(s): LIPASE, AMYLASE in the last 168 hours. No results for input(s): AMMONIA in the last 168 hours. CBC: No results for input(s): WBC, NEUTROABS, HGB, HCT, MCV, PLT in the last 168 hours. Cardiac Enzymes: No results for input(s): CKTOTAL, CKMB, CKMBINDEX, TROPONINI in the last 168 hours. BNP: Invalid input(s): POCBNP CBG: No results for input(s): GLUCAP in the last 168 hours. D-Dimer No results for input(s): DDIMER in the last 72 hours. Hgb A1c No results for input(s): HGBA1C in the last 72 hours. Lipid Profile No results for input(s): CHOL, HDL, LDLCALC, TRIG, CHOLHDL, LDLDIRECT in the last 72 hours. Thyroid function studies No results for input(s): TSH, T4TOTAL, T3FREE, THYROIDAB in the last 72 hours.  Invalid input(s): FREET3 Anemia work up No results for input(s): VITAMINB12, FOLATE, FERRITIN, TIBC, IRON, RETICCTPCT in the last 72 hours. Urinalysis    Component Value Date/Time   COLORURINE AMBER (A) 07/07/2020 0353   APPEARANCEUR HAZY (A) 07/07/2020 0353   LABSPEC 1.029 07/07/2020 0353   PHURINE 6.0 07/07/2020 0353   GLUCOSEU NEGATIVE 07/07/2020 0353   HGBUR MODERATE (A) 07/07/2020 0353   BILIRUBINUR NEGATIVE 07/07/2020 0353   KETONESUR NEGATIVE 07/07/2020 0353   PROTEINUR 100 (A) 07/07/2020 0353   NITRITE NEGATIVE 07/07/2020 0353   LEUKOCYTESUR NEGATIVE 07/07/2020 0353   Sepsis Labs Invalid input(s): PROCALCITONIN,  WBC,  LACTICIDVEN Microbiology No results found for this or any previous visit (from the past 240 hour(s)).   Time coordinating discharge: Over 30 minutes  SIGNED:   Ezekiel Slocumb, DO [facility] 07/31/2020, 7:57 PM   If 7PM-7AM, please contact night-coverage [email or web address]

## 2020-07-11 NOTE — TOC Transition Note (Signed)
Transition of Care [facility]) - CM/SW Discharge Note   Patient Details  Name: Jeffrey Torres MRN: [id number] Date of Birth: [birth year]  Transition of Care [facility]) CM/SW Contact:  Shelbie Hutching, RN Phone Number: 07/11/2020, 1:16 PM   Clinical Narrative:     Patient medically cleared for discharge home with home health services.  Patient's wife, Jeffrey Torres will be picking the patient up today.  RW ordered from [facility] and has been delivered to the patient's room.  [facility] arranged with [facility].  [facility] is aware of discharge today.    Final next level of care: Home w Home Health Services Barriers to Discharge: Barriers Resolved   Patient Goals and CMS Choice Patient states their goals for this hospitalization and ongoing recovery are:: Patient unable to state goals but wife would be glad for patient to come home with home health services. CMS Medicare.gov Compare Post Acute Care list provided to:: Patient Represenative (must comment) Choice offered to / list presented to : Spouse  Discharge Placement                       Discharge Plan and Services   Discharge Planning Services: CM Consult Post Acute Care Choice: Home Health          DME Arranged: Walker rolling DME Agency: [facility] Date DME Agency Contacted: 07/11/20 Time DME Agency Contacted: [id number] Representative spoke with at DME Agency: [place]: RN,PT,OT [place] Agency: [place] ([place]) Date [place]: 07/11/20 Time [place]: 1316 Representative spoke with at [place]: [place] ([place]) Interventions     Readmission Risk Interventions No flowsheet data found.

## 2020-07-13 DIAGNOSIS — I10 Essential (primary) hypertension: Secondary | ICD-10-CM | POA: Diagnosis not present

## 2020-07-13 DIAGNOSIS — Z9181 History of falling: Secondary | ICD-10-CM | POA: Diagnosis not present

## 2020-07-13 DIAGNOSIS — J9601 Acute respiratory failure with hypoxia: Secondary | ICD-10-CM | POA: Diagnosis not present

## 2020-07-13 DIAGNOSIS — U071 COVID-19: Secondary | ICD-10-CM | POA: Diagnosis not present

## 2020-07-13 DIAGNOSIS — G9341 Metabolic encephalopathy: Secondary | ICD-10-CM | POA: Diagnosis not present

## 2020-07-13 DIAGNOSIS — Z961 Presence of intraocular lens: Secondary | ICD-10-CM | POA: Diagnosis not present

## 2020-07-13 DIAGNOSIS — K4020 Bilateral inguinal hernia, without obstruction or gangrene, not specified as recurrent: Secondary | ICD-10-CM | POA: Diagnosis not present

## 2020-07-13 DIAGNOSIS — K219 Gastro-esophageal reflux disease without esophagitis: Secondary | ICD-10-CM | POA: Diagnosis not present

## 2020-07-13 DIAGNOSIS — Z87442 Personal history of urinary calculi: Secondary | ICD-10-CM | POA: Diagnosis not present

## 2020-07-13 DIAGNOSIS — M1990 Unspecified osteoarthritis, unspecified site: Secondary | ICD-10-CM | POA: Diagnosis not present

## 2020-07-13 DIAGNOSIS — I498 Other specified cardiac arrhythmias: Secondary | ICD-10-CM | POA: Diagnosis not present

## 2020-07-13 DIAGNOSIS — Z7951 Long term (current) use of inhaled steroids: Secondary | ICD-10-CM | POA: Diagnosis not present

## 2020-07-13 DIAGNOSIS — J1282 Pneumonia due to coronavirus disease 2019: Secondary | ICD-10-CM | POA: Diagnosis not present

## 2020-07-13 DIAGNOSIS — H3530 Unspecified macular degeneration: Secondary | ICD-10-CM | POA: Diagnosis not present

## 2020-07-13 DIAGNOSIS — Z7982 Long term (current) use of aspirin: Secondary | ICD-10-CM | POA: Diagnosis not present

## 2020-07-13 DIAGNOSIS — Z8521 Personal history of malignant neoplasm of larynx: Secondary | ICD-10-CM | POA: Diagnosis not present

## 2020-07-13 DIAGNOSIS — Z87891 Personal history of nicotine dependence: Secondary | ICD-10-CM | POA: Diagnosis not present

## 2020-07-13 DIAGNOSIS — E870 Hyperosmolality and hypernatremia: Secondary | ICD-10-CM | POA: Diagnosis not present

## 2020-07-13 DIAGNOSIS — Z9089 Acquired absence of other organs: Secondary | ICD-10-CM | POA: Diagnosis not present

## 2020-07-13 DIAGNOSIS — W19XXXD Unspecified fall, subsequent encounter: Secondary | ICD-10-CM | POA: Diagnosis not present

## 2020-07-13 DIAGNOSIS — H9190 Unspecified hearing loss, unspecified ear: Secondary | ICD-10-CM | POA: Diagnosis not present

## 2020-07-13 DIAGNOSIS — N179 Acute kidney failure, unspecified: Secondary | ICD-10-CM | POA: Diagnosis not present

## 2020-07-17 ENCOUNTER — Telehealth: Payer: Self-pay

## 2020-07-17 ENCOUNTER — Telehealth: Payer: Self-pay | Admitting: Family Medicine

## 2020-07-17 DIAGNOSIS — G9341 Metabolic encephalopathy: Secondary | ICD-10-CM | POA: Diagnosis not present

## 2020-07-17 DIAGNOSIS — U071 COVID-19: Secondary | ICD-10-CM | POA: Diagnosis not present

## 2020-07-17 DIAGNOSIS — N179 Acute kidney failure, unspecified: Secondary | ICD-10-CM | POA: Diagnosis not present

## 2020-07-17 DIAGNOSIS — J9601 Acute respiratory failure with hypoxia: Secondary | ICD-10-CM | POA: Diagnosis not present

## 2020-07-17 DIAGNOSIS — I10 Essential (primary) hypertension: Secondary | ICD-10-CM | POA: Diagnosis not present

## 2020-07-17 DIAGNOSIS — J1282 Pneumonia due to coronavirus disease 2019: Secondary | ICD-10-CM | POA: Diagnosis not present

## 2020-07-17 NOTE — Telephone Encounter (Signed)
Copied from [place] [phone]. Topic: Quick Communication - Home Health Verbal Orders >> Jul 17, 2020 12:05 PM Jodie Echevaria wrote: Caller/Agency: Sherlynn Stalls // [place] Number: [phone] [place] [facility] Requesting OT/PT/Skilled Nursing/Social Work/Speech Therapy: OT  Frequency: 1 week 2

## 2020-07-17 NOTE — Telephone Encounter (Signed)
Copied from [place] [phone]. Topic: Quick Communication - Home Health Verbal Orders >> Jul 16, 2020  4:32 PM Gillis Ends D wrote: Caller/Agency: Cecille Rubin from [place] Number: [phone] Requesting OT/PT/Skilled Nursing/Social Work/Speech Therapy: Skilled Nursing Frequency: 1 week 9 and 2 prn

## 2020-07-17 NOTE — Telephone Encounter (Signed)
Verbal orders given  

## 2020-07-17 NOTE — Telephone Encounter (Signed)
That's fine

## 2020-07-17 NOTE — Telephone Encounter (Signed)
Ok to order 

## 2020-07-17 NOTE — Telephone Encounter (Signed)
Jeffrey Torres as below.

## 2020-07-19 ENCOUNTER — Telehealth: Payer: Self-pay | Admitting: Family Medicine

## 2020-07-19 DIAGNOSIS — G9341 Metabolic encephalopathy: Secondary | ICD-10-CM | POA: Diagnosis not present

## 2020-07-19 DIAGNOSIS — U071 COVID-19: Secondary | ICD-10-CM | POA: Diagnosis not present

## 2020-07-19 DIAGNOSIS — N179 Acute kidney failure, unspecified: Secondary | ICD-10-CM | POA: Diagnosis not present

## 2020-07-19 DIAGNOSIS — J9601 Acute respiratory failure with hypoxia: Secondary | ICD-10-CM | POA: Diagnosis not present

## 2020-07-19 DIAGNOSIS — I10 Essential (primary) hypertension: Secondary | ICD-10-CM | POA: Diagnosis not present

## 2020-07-19 DIAGNOSIS — J1282 Pneumonia due to coronavirus disease 2019: Secondary | ICD-10-CM | POA: Diagnosis not present

## 2020-07-19 NOTE — Telephone Encounter (Signed)
That's fine

## 2020-07-19 NOTE — Telephone Encounter (Signed)
Jeffrey Torres from [facility] calling for verbal PT orders  1 wk 1  2 wk 2  1 wk 3  cb  [phone] This is secure VM

## 2020-07-20 DIAGNOSIS — U071 COVID-19: Secondary | ICD-10-CM | POA: Diagnosis not present

## 2020-07-20 DIAGNOSIS — N179 Acute kidney failure, unspecified: Secondary | ICD-10-CM | POA: Diagnosis not present

## 2020-07-20 DIAGNOSIS — I10 Essential (primary) hypertension: Secondary | ICD-10-CM | POA: Diagnosis not present

## 2020-07-20 DIAGNOSIS — G9341 Metabolic encephalopathy: Secondary | ICD-10-CM | POA: Diagnosis not present

## 2020-07-20 DIAGNOSIS — J1282 Pneumonia due to coronavirus disease 2019: Secondary | ICD-10-CM | POA: Diagnosis not present

## 2020-07-20 DIAGNOSIS — J9601 Acute respiratory failure with hypoxia: Secondary | ICD-10-CM | POA: Diagnosis not present

## 2020-07-22 ENCOUNTER — Other Ambulatory Visit: Payer: Self-pay

## 2020-07-22 ENCOUNTER — Telehealth: Payer: Self-pay | Admitting: Family Medicine

## 2020-07-22 ENCOUNTER — Emergency Department: Payer: Medicare Other

## 2020-07-22 DIAGNOSIS — J9601 Acute respiratory failure with hypoxia: Secondary | ICD-10-CM | POA: Diagnosis not present

## 2020-07-22 DIAGNOSIS — R0902 Hypoxemia: Secondary | ICD-10-CM | POA: Insufficient documentation

## 2020-07-22 DIAGNOSIS — Z5321 Procedure and treatment not carried out due to patient leaving prior to being seen by health care provider: Secondary | ICD-10-CM | POA: Insufficient documentation

## 2020-07-22 DIAGNOSIS — J1282 Pneumonia due to coronavirus disease 2019: Secondary | ICD-10-CM | POA: Diagnosis not present

## 2020-07-22 DIAGNOSIS — J189 Pneumonia, unspecified organism: Secondary | ICD-10-CM | POA: Diagnosis not present

## 2020-07-22 DIAGNOSIS — N179 Acute kidney failure, unspecified: Secondary | ICD-10-CM | POA: Diagnosis not present

## 2020-07-22 DIAGNOSIS — I10 Essential (primary) hypertension: Secondary | ICD-10-CM | POA: Diagnosis not present

## 2020-07-22 DIAGNOSIS — R531 Weakness: Secondary | ICD-10-CM | POA: Diagnosis not present

## 2020-07-22 DIAGNOSIS — U071 COVID-19: Secondary | ICD-10-CM | POA: Diagnosis not present

## 2020-07-22 DIAGNOSIS — G9341 Metabolic encephalopathy: Secondary | ICD-10-CM | POA: Diagnosis not present

## 2020-07-22 LAB — COMPREHENSIVE METABOLIC PANEL
ALT: 43 U/L (ref 0–44)
AST: 30 U/L (ref 15–41)
Albumin: 3 g/dL — ABNORMAL LOW (ref 3.5–5.0)
Alkaline Phosphatase: 64 U/L (ref 38–126)
Anion gap: 15 (ref 5–15)
BUN: 11 mg/dL (ref 8–23)
CO2: 28 mmol/L (ref 22–32)
Calcium: 8.3 mg/dL — ABNORMAL LOW (ref 8.9–10.3)
Chloride: 92 mmol/L — ABNORMAL LOW (ref 98–111)
Creatinine, Ser: 1.08 mg/dL (ref 0.61–1.24)
GFR, Estimated: >60 mL/min (ref >60)
Glucose, Bld: 125 mg/dL — ABNORMAL HIGH (ref 70–99)
Potassium: 2.5 mmol/L — CL (ref 3.5–5.1)
Sodium: 135 mmol/L (ref 135–145)
Total Bilirubin: 0.7 mg/dL (ref 0.3–1.2)
Total Protein: 6.9 g/dL (ref 6.5–8.1)

## 2020-07-22 LAB — CBC
HCT: 41 % (ref 39.0–52.0)
Hemoglobin: 14.1 g/dL (ref 13.0–17.0)
MCH: 30.2 pg (ref 26.0–34.0)
MCHC: 34.4 g/dL (ref 30.0–36.0)
MCV: 87.8 fL (ref 80.0–100.0)
Platelets: 260 10*3/uL (ref 150–400)
RBC: 4.67 MIL/uL (ref 4.22–5.81)
RDW: 14.2 % (ref 11.5–15.5)
WBC: 7.6 10*3/uL (ref 4.0–10.5)
nRBC: 0 % (ref 0.0–0.2)

## 2020-07-22 LAB — TROPONIN I (HIGH SENSITIVITY)
Troponin I (High Sensitivity): 8 ng/L (ref <18)
Troponin I (High Sensitivity): 8 ng/L (ref <18)

## 2020-07-22 NOTE — Telephone Encounter (Signed)
[phone] Sherlynn Stalls calling from [facility] OT called to report. Information submitted to triage, waited for warm transfer   BP 100/45 Oxygen 87 Is home without oxygen

## 2020-07-22 NOTE — Telephone Encounter (Signed)
Patient may need to be transported back to hospital due to low oxygen levels. It appears his O2 was around 92-93% on RA at baseline on discharge. Also his BP was up in the 130s-160s/80s. It appears with these vitals he may be becoming more dehydrated again and possibly having issues with hypoxia again.

## 2020-07-22 NOTE — Progress Notes (Signed)
Subjective:   Jeffrey Torres is a 75 y.o. male who presents for Medicare Annual/Subsequent preventive examination.  I connected with Vanessa  today by telephone and verified that I am speaking with the correct person using two identifiers. Location patient: home Location provider: work Persons participating in the virtual visit: patient, provider.   I discussed the limitations, risks, security and privacy concerns of performing an evaluation and management service by telephone and the availability of in person appointments. I also discussed with the patient that there may be a patient responsible charge related to this service. The patient expressed understanding and verbally consented to this telephonic visit.    Interactive audio and video telecommunications were attempted between this provider and patient, however failed, due to patient having technical difficulties OR patient did not have access to video capability.  We continued and completed visit with audio only.   Review of Systems    N/A  Cardiac Risk Factors include: advanced age (>10men, >7 women);hypertension;male gender     Objective:    There were no vitals filed for this visit. There is no height or weight on file to calculate BMI.  Advanced Directives 07/23/2020 07/08/2020 07/06/2020 03/12/2020 08/08/2019 08/07/2019 07/20/2019  Does Patient Have a Medical Advance Directive? No No;Unable to assess, patient is non-responsive or altered mental status No No No No No  Type of Advance Directive - - - - - - -  Copy of Healthcare Power of Attorney in Chart? - - - - - - -  Would patient like information on creating a medical advance directive? No - Patient declined No - Patient declined - - No - Patient declined No - Patient declined No - Patient declined    Current Medications (verified) Outpatient Encounter Medications as of 07/23/2020  Medication Sig  . albuterol (VENTOLIN HFA) 108 (90 Base) MCG/ACT inhaler Inhale 2 puffs  into the lungs every 6 (six) hours as needed for wheezing or shortness of breath.  Marland Kitchen aspirin 81 MG tablet Take 81 mg by mouth daily.  Marland Kitchen diltiazem (CARDIZEM CD) 120 MG 24 hr capsule Take 1 capsule (120 mg total) by mouth daily.  . feeding supplement (ENSURE ENLIVE / ENSURE PLUS) LIQD Take 237 mLs by mouth 3 (three) times daily between meals.  . fluticasone (FLONASE) 50 MCG/ACT nasal spray Place 2 sprays into both nostrils every morning.   . hydrochlorothiazide (HYDRODIURIL) 25 MG tablet Take 25 mg by mouth daily.  . Multiple Vitamins-Minerals (PRESERVISION AREDS 2 PO) Take 2 capsules by mouth every morning.   Marland Kitchen omeprazole (PRILOSEC) 20 MG capsule Take 20 mg by mouth daily before breakfast.  . tamsulosin (FLOMAX) 0.4 MG CAPS capsule Take 1 capsule (0.4 mg total) by mouth daily after supper.   No facility-administered encounter medications on file as of 07/23/2020.    Allergies (verified) Penicillins   History: Past Medical History:  Diagnosis Date  . Arthritis   . Cancer [facility])    larynx ca, surgery and radiation  . GERD (gastroesophageal reflux disease)   . Heart murmur   . Hernia, inguinal, bilateral   . History of chicken pox   . History of kidney stones   . History of measles   . History of mumps   . History of shingles   . HOH (hard of hearing)   . Macular degeneration of both eyes   . Motion sickness    ocean boat  . PVC (premature ventricular contraction)    Past Surgical History:  Procedure  Laterality Date  . CARDIAC CATHETERIZATION  2010   normal per patient report  . CATARACT EXTRACTION W/PHACO Right 07/18/2019   Procedure: CATARACT EXTRACTION PHACO AND INTRAOCULAR LENS PLACEMENT (IOC) RIGHT 4.54 00:29.7;  Surgeon: Birder Robson, MD;  Location: [facility];  Service: Ophthalmology;  Laterality: Right;  . CATARACT EXTRACTION W/PHACO Left 08/08/2019   Procedure: CATARACT EXTRACTION PHACO AND INTRAOCULAR LENS PLACEMENT ([place]) LEFT;  Surgeon: Birder Robson,  MD;  Location: [facility] ORS;  Service: Ophthalmology;  Laterality: Left;  Lot #[id number] H [place]: 00:34.5 CDE: 4.75  . COLONOSCOPY  06/24/2006   Dr. Bary Castilla. Multiple benign appearing 63mm polyps in the cecum and in the rectum. -Three 87mm polyps in the transversed colon, in the tranverse colon, proximal and in the distal transverse colon. Resected and retrieved.  . COLONOSCOPY WITH PROPOFOL N/A 03/12/2020   Procedure: COLONOSCOPY WITH PROPOFOL;  Surgeon: Lucilla Lame, MD;  [facility] ENDOSCOPY;  Service: Endoscopy;  Laterality: N/A;  . CYSTOSCOPY WITH HOLMIUM LASER LITHOTRIPSY  2001   kidney stones removed, [facility]  . LARYNGOSCOPY Right 04/01/2017   Procedure: SUSPENSION LARYNGOSCOPY WITH MICROFLAP EXCISION;  Surgeon: Carloyn Manner, MD;  Location: [facility] ORS;  Service: ENT;  Laterality: Right;  . sinus surgery   2002   [place]; removal of polyps  . TONSILLECTOMY  1954  . Tubular adenoma removed  06/24/2006   Family History  Problem Relation Age of Onset  . Lung cancer Mother   . Heart disease Father    Social History   Socioeconomic History  . Marital status: Legally Separated    Spouse name: Not on file  . Number of children: 2  . Years of education: Not on file  . Highest education level: 12th grade  Occupational History  . Occupation: Truck Geophysicist/field seismologist    Comment: unemployed  Tobacco Use  . Smoking status: Former Smoker    Packs/day: 1.00    Types: Cigarettes    Quit date: 12/2017    Years since quitting: 2.5  . Smokeless tobacco: Never Used  . Tobacco comment: started age 57 yo.previously smoked carton a week  Vaping Use  . Vaping Use: Never used  Substance and Sexual Activity  . Alcohol use: Not Currently    Alcohol/week: 0.0 standard drinks  . Drug use: No  . Sexual activity: Not on file  Other Topics Concern  . 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  Difficulty of Paying Living Expenses: Somewhat hard  Food Insecurity: No Food Insecurity  . Worried About Charity fundraiser in the Last Year: Never true  . Ran Out of Food in the Last Year: Never true  Transportation Needs: No Transportation Needs  . Lack of Transportation (Medical): No  . Lack of Transportation (Non-Medical): No  Physical Activity: Inactive  . Days of Exercise per Week: 0 days  . Minutes of Exercise per Session: 0 min  Stress: No Stress Concern Present  . Feeling of Stress : Not at all  Social Connections: Socially Isolated  . Frequency of Communication with Friends and Family: More than three times a week  . Frequency of Social Gatherings with Friends and Family: More than three times a week  . Attends Religious Services: Never  . Active Member of Clubs or Organizations: No  . Attends Archivist Meetings: Never  . Marital Status: Separated    Tobacco Counseling  Counseling given: Not Answered Comment: started age 72 yo.previously smoked carton a week   Clinical Intake:  Pre-visit preparation completed: Yes  Pain : No/denies pain     Nutritional Risks: None Diabetes: No  How often do you need to have someone help you when you read instructions, pamphlets, or other written materials from your doctor or pharmacy?: 1 - Never  Diabetic? No  Interpreter Needed?: No  Information entered by :: [facility], LPN   Activities of Daily Living In your present state of health, do you have any difficulty performing the following activities: 07/23/2020 07/08/2020  Hearing? Y N  Comment Due to tinnitus in both ears. -  Vision? Y N  Comment Due to MD in both eyes and loss of vision in the left eye. -  Difficulty concentrating or making decisions? N Y  Walking or climbing stairs? N Y  Dressing or bathing? N Y  Doing errands, shopping? N Y  Conservation officer, nature and eating ? N -  Using the Toilet? N -  In the past six months, have you accidently leaked urine?  N -  Do you have problems with loss of bowel control? N -  Managing your Medications? N -  Managing your Finances? N -  Housekeeping or managing your Housekeeping? N -  Some recent data might be hidden    Patient Care Team: Birdie Sons, MD as PCP - General (Family Medicine) Kate Sable, MD as PCP - Cardiology (Cardiology) Birder Robson, MD as Referring Physician (Ophthalmology) Carloyn Manner, MD as Referring Physician (Otolaryngology) Noreene Filbert, MD as Referring Physician (Radiation Oncology) Isaias Sakai, MD as Referring Physician (Ophthalmology)  Indicate any recent Medical Services you may have received from other than [facility] providers in the past year (date may be approximate).     Assessment:   This is a routine wellness examination for Ein.  Hearing/Vision screen No exam data present  Dietary issues and exercise activities discussed: Current Exercise Habits: Home exercise routine, Type of exercise: stretching;Other - see comments (PT), Time (Minutes): 15 (to 20 mins), Frequency (Times/Week): 4 (to 5 days), Weekly Exercise (Minutes/Week): 60, Intensity: Mild, Exercise limited by: orthopedic condition(s)  Goals    . DIET - REDUCE CALORIE INTAKE     Recommend continuing current diet plan of avoiding fried foods and junk food to help aid in weight loss.       Depression Screen PHQ 2/9 Scores 07/23/2020 07/20/2019 07/20/2019 01/24/2018 12/28/2017 12/15/2016 12/15/2016  PHQ - 2 Score 0 0 0 0 0 0 0  PHQ- 9 Score - - - - - 4 -    Fall Risk Fall Risk  07/23/2020 07/20/2019 05/19/2019 01/24/2018 12/28/2017  Falls in the past year? 0 0 0 No No  Comment - - Emmi Telephone Survey: data to providers prior to load - -  Number falls in past yr: 0 0 - - -  Injury with Fall? 0 0 - - -    FALL RISK PREVENTION PERTAINING TO THE HOME:  Any stairs in or around the home? No  If so, are there any without handrails? No  Home free of loose throw rugs in walkways, pet beds,  electrical cords, etc? Yes  Adequate lighting in your home to reduce risk of falls? Yes   ASSISTIVE DEVICES UTILIZED TO PREVENT FALLS:  Life alert? No  Use of a cane, walker or w/c? No  Grab bars in the bathroom? Yes  Shower chair or bench in shower? No  Elevated toilet seat  or a handicapped toilet? Yes    Cognitive Function: Normal cognitive status assessed by observation by this Nurse Health Advisor. No abnormalities found.       6CIT Screen 07/20/2019 12/15/2016  What Year? 0 points 0 points  What month? 0 points 0 points  What time? 0 points 0 points  Count back from 20 0 points 0 points  Months in reverse 0 points 0 points  Repeat phrase 4 points 0 points  Total Score 4 0    Immunizations Immunization History  Administered Date(s) Administered  . Influenza, High Dose Seasonal PF 03/25/2017, 03/29/2018, 04/24/2019  . Pneumococcal Conjugate-13 04/26/2014  . Pneumococcal Polysaccharide-23 05/28/2012    TDAP status: Up to date  Flu Vaccine status: Declined, Education has been provided regarding the importance of this vaccine but patient still declined. Advised may receive this vaccine at local pharmacy or Health Dept. Aware to provide a copy of the vaccination record if obtained from local pharmacy or Health Dept. Verbalized acceptance and understanding.  Pneumococcal vaccine status: Up to date  Covid-19 vaccine status: Declined, Education has been provided regarding the importance of this vaccine but patient still declined. Advised may receive this vaccine at local pharmacy or Health Dept.or vaccine clinic. Aware to provide a copy of the vaccination record if obtained from local pharmacy or Health Dept. Verbalized acceptance and understanding.  Qualifies for Shingles Vaccine? Yes   Zostavax completed No   Shingrix Completed?: No.    Education has been provided regarding the importance of this vaccine. Patient has been advised to call insurance company to determine out of  pocket expense if they have not yet received this vaccine. Advised may also receive vaccine at local pharmacy or Health Dept. Verbalized acceptance and understanding.  Screening Tests Health Maintenance  Topic Date Due  . COVID-19 Vaccine (1) Never done  . INFLUENZA VACCINE  09/26/2020 (Originally 01/28/2020)  . TETANUS/TDAP  07/23/2021 (Originally 03/13/1965)  . COLONOSCOPY (Pts 45-22yrs Insurance coverage will need to be confirmed)  03/13/2023  . Hepatitis C Screening  Completed  . PNA vac Low Risk Adult  Completed    Health Maintenance  Health Maintenance Due  Topic Date Due  . COVID-19 Vaccine (1) Never done    Colorectal cancer screening: Type of screening: Colonoscopy. Completed 03/12/20. Repeat every 3 years  Lung Cancer Screening: (Low Dose CT Chest recommended if Age 40-80 years, 30 pack-year currently smoking OR have quit w/in 15years.) does qualify.   Lung Cancer Screening Referral: An Epic message has been sent to Burgess Estelle, RN (Oncology Nurse Navigator) regarding the possible need for this exam. Raquel Sarna will review the patient's chart to determine if the patient truly qualifies for the exam. If the patient qualifies, Raquel Sarna will order the Low Dose CT of the chest to facilitate the scheduling of this exam.  Additional Screening:  Hepatitis C Screening: Up to date  Vision Screening: Recommended annual ophthalmology exams for early detection of glaucoma and other disorders of the eye. Is the patient up to date with their annual eye exam?  Yes  Who is the provider or what is the name of the office in which the patient attends annual eye exams? Dr George Ina @ [place] If pt is not established with a provider, would they like to be referred to a provider to establish care? No .   Dental Screening: Recommended annual dental exams for proper oral hygiene  Community Resource Referral / Chronic Care Management: CRR required this visit?  No  CCM required this visit?  No       Plan:     I have personally reviewed and noted the following in the patient's chart:   . Medical and social history . Use of alcohol, tobacco or illicit drugs  . Current medications and supplements . Functional ability and status . Nutritional status . Physical activity . Advanced directives . List of other physicians . Hospitalizations, surgeries, and ER visits in previous 12 months . Vitals . Screenings to include cognitive, depression, and falls . Referrals and appointments  In addition, I have reviewed and discussed with patient certain preventive protocols, quality metrics, and best practice recommendations. A written personalized care plan for preventive services as well as general preventive health recommendations were provided to patient.     Shreeya Recendiz [place], [place]   [id number]   Nurse Notes: Pt is undecided about if he will receive the Covid vaccines or not. Pt to  discuss with PCP at next in office apt. Pt declined a flu shot this season.

## 2020-07-22 NOTE — ED Triage Notes (Signed)
Pt was told to come here by pcp was recently admitted to hospital for pneumonia and sepsis. He also had Covid, was told by home health nurses today that o2 sats were low. States feels weak but states has felt that way since he was discharged. Pt in no distress at this time. Has no other complaints.

## 2020-07-22 NOTE — Telephone Encounter (Signed)
I spoke with Mr. Patchell.  He does not have anything to check his oxygen at home.  He also lives alone.  He states he does get short of breath quickly when it walks around.  I advised him he would need to be evaluated at the ER.  Pt states he is not willing to go back to the hospital but did agree to find someone to take him to Urgent Care.   Thanks,   -Mickel Baas

## 2020-07-23 ENCOUNTER — Emergency Department
Admission: EM | Admit: 2020-07-23 | Discharge: 2020-07-23 | Disposition: A | Discharge status: Home / Self Care | Payer: Medicare Other | Attending: Emergency Medicine | Admitting: Emergency Medicine

## 2020-07-23 ENCOUNTER — Ambulatory Visit (INDEPENDENT_AMBULATORY_CARE_PROVIDER_SITE_OTHER): Payer: Medicare Other

## 2020-07-23 DIAGNOSIS — Z0000 Encounter for general adult medical examination without abnormal findings: Secondary | ICD-10-CM | POA: Diagnosis not present

## 2020-07-23 NOTE — Telephone Encounter (Signed)
Advised lynn as below.

## 2020-07-23 NOTE — Patient Instructions (Signed)
[name] , Thank you for taking time to come for your Medicare Wellness Visit. I appreciate your ongoing commitment to your health goals. Please review the following plan we discussed and let me know if I can assist you in the future.   Screening recommendations/referrals: Colonoscopy: Up to date, due 02/2023 Recommended yearly ophthalmology/optometry visit for glaucoma screening and checkup Recommended yearly dental visit for hygiene and checkup  Vaccinations: Influenza vaccine: Currently due, declined receiving.  Pneumococcal vaccine: Completed series Tdap vaccine: Currently due, declined receiving.  Shingles vaccine: Shingrix discussed. Please contact your pharmacy for coverage information.     Advanced directives: Advance directive discussed with you today. Even though you declined this today please call our office should you change your mind and we can give you the proper paperwork for you to fill out.   Conditions/risks identified: Continue current diet plan of avoiding fried foods and junk food to help aid in weight loss.   Next appointment: 08/07/20 @ 8:00 AM with Dr Caryn Section  Preventive Care 74 Years and Older, Male Preventive care refers to lifestyle choices and visits with your health care provider that can promote health and wellness. What does preventive care include?  A yearly physical exam. This is also called an annual well check.  Dental exams once or twice a year.  Routine eye exams. Ask your health care provider how often you should have your eyes checked.  Personal lifestyle choices, including:  Daily care of your teeth and gums.  Regular physical activity.  Eating a healthy diet.  Avoiding tobacco and drug use.  Limiting alcohol use.  Practicing safe sex.  Taking low doses of aspirin every day.  Taking vitamin and mineral supplements as recommended by your health care provider. What happens during an annual well check? The services and screenings done  by your health care provider during your annual well check will depend on your age, overall health, lifestyle risk factors, and family history of disease. Counseling  Your health care provider may ask you questions about your:  Alcohol use.  Tobacco use.  Drug use.  Emotional well-being.  Home and relationship well-being.  Sexual activity.  Eating habits.  History of falls.  Memory and ability to understand (cognition).  Work and work Statistician. Screening  You may have the following tests or measurements:  Height, weight, and BMI.  Blood pressure.  Lipid and cholesterol levels. These may be checked every 5 years, or more frequently if you are over 74 years old.  Skin check.  Lung cancer screening. You may have this screening every year starting at age 74 if you have a 30-pack-year history of smoking and currently smoke or have quit within the past 15 years.  Fecal occult blood test (FOBT) of the stool. You may have this test every year starting at age 74.  Flexible sigmoidoscopy or colonoscopy. You may have a sigmoidoscopy every 5 years or a colonoscopy every 10 years starting at age 74.  Prostate cancer screening. Recommendations will vary depending on your family history and other risks.  Hepatitis C blood test.  Hepatitis B blood test.  Sexually transmitted disease (STD) testing.  Diabetes screening. This is done by checking your blood sugar (glucose) after you have not eaten for a while (fasting). You may have this done every 1-3 years.  Abdominal aortic aneurysm (AAA) screening. You may need this if you are a current or former smoker.  Osteoporosis. You may be screened starting at age 74 if you are  at high risk. Talk with your health care provider about your test results, treatment options, and if necessary, the need for more tests. Vaccines  Your health care provider may recommend certain vaccines, such as:  Influenza vaccine. This is recommended every  year.  Tetanus, diphtheria, and acellular pertussis (Tdap, Td) vaccine. You may need a Td booster every 10 years.  Zoster vaccine. You may need this after age 74.  Pneumococcal 13-valent conjugate (PCV13) vaccine. One dose is recommended after age 50.  Pneumococcal polysaccharide (PPSV23) vaccine. One dose is recommended after age 74. Talk to your health care provider about which screenings and vaccines you need and how often you need them. This information is not intended to replace advice given to you by your health care provider. Make sure you discuss any questions you have with your health care provider. Document Released: 07/12/2015 Document Revised: 03/04/2016 Document Reviewed: 04/16/2015 Elsevier Interactive Patient Education  2017 [employer] Prevention in the Home Falls can cause injuries. They can happen to people of all ages. There are many things you can do to make your home safe and to help prevent falls. What can I do on the outside of my home?  Regularly fix the edges of walkways and driveways and fix any cracks.  Remove anything that might make you trip as you walk through a door, such as a raised step or threshold.  Trim any bushes or trees on the path to your home.  Use bright outdoor lighting.  Clear any walking paths of anything that might make someone trip, such as rocks or tools.  Regularly check to see if handrails are loose or broken. Make sure that both sides of any steps have handrails.  Any raised decks and porches should have guardrails on the edges.  Have any leaves, snow, or ice cleared regularly.  Use sand or salt on walking paths during winter.  Clean up any spills in your garage right away. This includes oil or grease spills. What can I do in the bathroom?  Use night lights.  Install grab bars by the toilet and in the tub and shower. Do not use towel bars as grab bars.  Use non-skid mats or decals in the tub or shower.  If you  need to sit down in the shower, use a plastic, non-slip stool.  Keep the floor dry. Clean up any water that spills on the floor as soon as it happens.  Remove soap buildup in the tub or shower regularly.  Attach bath mats securely with double-sided non-slip rug tape.  Do not have throw rugs and other things on the floor that can make you trip. What can I do in the bedroom?  Use night lights.  Make sure that you have a light by your bed that is easy to reach.  Do not use any sheets or blankets that are too big for your bed. They should not hang down onto the floor.  Have a firm chair that has side arms. You can use this for support while you get dressed.  Do not have throw rugs and other things on the floor that can make you trip. What can I do in the kitchen?  Clean up any spills right away.  Avoid walking on wet floors.  Keep items that you use a lot in easy-to-reach places.  If you need to reach something above you, use a strong step stool that has a grab bar.  Keep electrical cords out of  the way.  Do not use floor polish or wax that makes floors slippery. If you must use wax, use non-skid floor wax.  Do not have throw rugs and other things on the floor that can make you trip. What can I do with my stairs?  Do not leave any items on the stairs.  Make sure that there are handrails on both sides of the stairs and use them. Fix handrails that are broken or loose. Make sure that handrails are as long as the stairways.  Check any carpeting to make sure that it is firmly attached to the stairs. Fix any carpet that is loose or worn.  Avoid having throw rugs at the top or bottom of the stairs. If you do have throw rugs, attach them to the floor with carpet tape.  Make sure that you have a light switch at the top of the stairs and the bottom of the stairs. If you do not have them, ask someone to add them for you. What else can I do to help prevent falls?  Wear shoes  that:  Do not have high heels.  Have rubber bottoms.  Are comfortable and fit you well.  Are closed at the toe. Do not wear sandals.  If you use a stepladder:  Make sure that it is fully opened. Do not climb a closed stepladder.  Make sure that both sides of the stepladder are locked into place.  Ask someone to hold it for you, if possible.  Clearly mark and make sure that you can see:  Any grab bars or handrails.  First and last steps.  Where the edge of each step is.  Use tools that help you move around (mobility aids) if they are needed. These include:  Canes.  Walkers.  Scooters.  Crutches.  Turn on the lights when you go into a dark area. Replace any light bulbs as soon as they burn out.  Set up your furniture so you have a clear path. Avoid moving your furniture around.  If any of your floors are uneven, fix them.  If there are any pets around you, be aware of where they are.  Review your medicines with your doctor. Some medicines can make you feel dizzy. This can increase your chance of falling. Ask your doctor what other things that you can do to help prevent falls. This information is not intended to replace advice given to you by your health care provider. Make sure you discuss any questions you have with your health care provider. Document Released: 04/11/2009 Document Revised: 11/21/2015 Document Reviewed: 07/20/2014 Elsevier Interactive Patient Education  2017 [employer].

## 2020-07-24 ENCOUNTER — Telehealth: Payer: Self-pay | Admitting: Emergency Medicine

## 2020-07-24 ENCOUNTER — Encounter (INDEPENDENT_AMBULATORY_CARE_PROVIDER_SITE_OTHER): Payer: Medicare Other | Admitting: Family Medicine

## 2020-07-24 ENCOUNTER — Telehealth: Payer: Self-pay

## 2020-07-24 DIAGNOSIS — I498 Other specified cardiac arrhythmias: Secondary | ICD-10-CM | POA: Diagnosis not present

## 2020-07-24 DIAGNOSIS — J9601 Acute respiratory failure with hypoxia: Secondary | ICD-10-CM

## 2020-07-24 DIAGNOSIS — K219 Gastro-esophageal reflux disease without esophagitis: Secondary | ICD-10-CM | POA: Diagnosis not present

## 2020-07-24 DIAGNOSIS — M1990 Unspecified osteoarthritis, unspecified site: Secondary | ICD-10-CM | POA: Diagnosis not present

## 2020-07-24 DIAGNOSIS — K4020 Bilateral inguinal hernia, without obstruction or gangrene, not specified as recurrent: Secondary | ICD-10-CM

## 2020-07-24 DIAGNOSIS — N179 Acute kidney failure, unspecified: Secondary | ICD-10-CM | POA: Diagnosis not present

## 2020-07-24 DIAGNOSIS — I10 Essential (primary) hypertension: Secondary | ICD-10-CM | POA: Diagnosis not present

## 2020-07-24 DIAGNOSIS — E876 Hypokalemia: Secondary | ICD-10-CM

## 2020-07-24 DIAGNOSIS — E870 Hyperosmolality and hypernatremia: Secondary | ICD-10-CM | POA: Diagnosis not present

## 2020-07-24 DIAGNOSIS — G9341 Metabolic encephalopathy: Secondary | ICD-10-CM

## 2020-07-24 DIAGNOSIS — J1282 Pneumonia due to coronavirus disease 2019: Secondary | ICD-10-CM | POA: Diagnosis not present

## 2020-07-24 MED ORDER — POTASSIUM CHLORIDE CRYS ER 20 MEQ PO TBCR: 20.0000 meq | EXTENDED_RELEASE_TABLET | Freq: Every day | ORAL | 1 refills | Status: DC

## 2020-07-24 NOTE — Telephone Encounter (Signed)
Have sent prescription for potassium supplement to his pharmacy. Was he given any potassium in the ER? If not then he needs to take 2 pills today, then one daily after that. Need to recheck potassium early next week, have placed future order in chart, does not need appt for labs.

## 2020-07-24 NOTE — Telephone Encounter (Signed)
Called patient due to left emergency department before provider exam to inquire about condition and follow up plans. He says he was not going to call pcp as the oxygen was okay here.  I asked him to call due to labs need to be reviewed. I told him the potassium was low and he should ask for next steps.  He agrees.

## 2020-07-24 NOTE — Telephone Encounter (Signed)
Patient advised. He says he wasn't given any potassium while in the ER. Patient verbalized understanding of instructions and rechecking labs next week.

## 2020-07-24 NOTE — Telephone Encounter (Signed)
Copied from [place] [id number]. Topic: General - Other >> Jul 24, 2020  3:30 PM Leward Quan A wrote: Reason for CRM: Patient called in to inform Dr Caryn Section that he went to the ER and was told that his Potassium is really low and need to contact his PCP. Please advise Ph# [phone]

## 2020-07-25 DIAGNOSIS — N179 Acute kidney failure, unspecified: Secondary | ICD-10-CM | POA: Diagnosis not present

## 2020-07-25 DIAGNOSIS — U071 COVID-19: Secondary | ICD-10-CM | POA: Diagnosis not present

## 2020-07-25 DIAGNOSIS — J1282 Pneumonia due to coronavirus disease 2019: Secondary | ICD-10-CM | POA: Diagnosis not present

## 2020-07-25 DIAGNOSIS — J9601 Acute respiratory failure with hypoxia: Secondary | ICD-10-CM | POA: Diagnosis not present

## 2020-07-25 DIAGNOSIS — G9341 Metabolic encephalopathy: Secondary | ICD-10-CM | POA: Diagnosis not present

## 2020-07-25 DIAGNOSIS — I10 Essential (primary) hypertension: Secondary | ICD-10-CM | POA: Diagnosis not present

## 2020-07-26 DIAGNOSIS — G9341 Metabolic encephalopathy: Secondary | ICD-10-CM | POA: Diagnosis not present

## 2020-07-26 DIAGNOSIS — J9601 Acute respiratory failure with hypoxia: Secondary | ICD-10-CM | POA: Diagnosis not present

## 2020-07-26 DIAGNOSIS — J1282 Pneumonia due to coronavirus disease 2019: Secondary | ICD-10-CM | POA: Diagnosis not present

## 2020-07-26 DIAGNOSIS — I10 Essential (primary) hypertension: Secondary | ICD-10-CM | POA: Diagnosis not present

## 2020-07-26 DIAGNOSIS — U071 COVID-19: Secondary | ICD-10-CM | POA: Diagnosis not present

## 2020-07-26 DIAGNOSIS — N179 Acute kidney failure, unspecified: Secondary | ICD-10-CM | POA: Diagnosis not present

## 2020-07-29 DIAGNOSIS — H353221 Exudative age-related macular degeneration, left eye, with active choroidal neovascularization: Secondary | ICD-10-CM | POA: Diagnosis not present

## 2020-07-30 DIAGNOSIS — J9601 Acute respiratory failure with hypoxia: Secondary | ICD-10-CM | POA: Diagnosis not present

## 2020-07-30 DIAGNOSIS — G9341 Metabolic encephalopathy: Secondary | ICD-10-CM | POA: Diagnosis not present

## 2020-07-30 DIAGNOSIS — J1282 Pneumonia due to coronavirus disease 2019: Secondary | ICD-10-CM | POA: Diagnosis not present

## 2020-07-30 DIAGNOSIS — U071 COVID-19: Secondary | ICD-10-CM | POA: Diagnosis not present

## 2020-07-30 DIAGNOSIS — N179 Acute kidney failure, unspecified: Secondary | ICD-10-CM | POA: Diagnosis not present

## 2020-07-30 DIAGNOSIS — I10 Essential (primary) hypertension: Secondary | ICD-10-CM | POA: Diagnosis not present

## 2020-07-31 DIAGNOSIS — N179 Acute kidney failure, unspecified: Secondary | ICD-10-CM | POA: Diagnosis present

## 2020-07-31 DIAGNOSIS — J9601 Acute respiratory failure with hypoxia: Secondary | ICD-10-CM | POA: Diagnosis not present

## 2020-07-31 DIAGNOSIS — U071 COVID-19: Secondary | ICD-10-CM

## 2020-07-31 DIAGNOSIS — J1282 Pneumonia due to coronavirus disease 2019: Secondary | ICD-10-CM

## 2020-07-31 DIAGNOSIS — E870 Hyperosmolality and hypernatremia: Secondary | ICD-10-CM | POA: Diagnosis present

## 2020-07-31 DIAGNOSIS — I10 Essential (primary) hypertension: Secondary | ICD-10-CM | POA: Diagnosis not present

## 2020-07-31 DIAGNOSIS — G9341 Metabolic encephalopathy: Secondary | ICD-10-CM

## 2020-07-31 HISTORY — DX: Metabolic encephalopathy: G93.41

## 2020-07-31 HISTORY — DX: COVID-19: U07.1

## 2020-07-31 HISTORY — DX: Pneumonia due to coronavirus disease 2019: J12.82

## 2020-08-01 DIAGNOSIS — U071 COVID-19: Secondary | ICD-10-CM | POA: Diagnosis not present

## 2020-08-01 DIAGNOSIS — J9601 Acute respiratory failure with hypoxia: Secondary | ICD-10-CM | POA: Diagnosis not present

## 2020-08-01 DIAGNOSIS — I10 Essential (primary) hypertension: Secondary | ICD-10-CM | POA: Diagnosis not present

## 2020-08-01 DIAGNOSIS — G9341 Metabolic encephalopathy: Secondary | ICD-10-CM | POA: Diagnosis not present

## 2020-08-01 DIAGNOSIS — N179 Acute kidney failure, unspecified: Secondary | ICD-10-CM | POA: Diagnosis not present

## 2020-08-01 DIAGNOSIS — J1282 Pneumonia due to coronavirus disease 2019: Secondary | ICD-10-CM | POA: Diagnosis not present

## 2020-08-02 ENCOUNTER — Other Ambulatory Visit: Payer: Self-pay

## 2020-08-02 DIAGNOSIS — E876 Hypokalemia: Secondary | ICD-10-CM

## 2020-08-02 DIAGNOSIS — Z8521 Personal history of malignant neoplasm of larynx: Secondary | ICD-10-CM | POA: Diagnosis not present

## 2020-08-02 DIAGNOSIS — K219 Gastro-esophageal reflux disease without esophagitis: Secondary | ICD-10-CM | POA: Diagnosis not present

## 2020-08-06 DIAGNOSIS — I10 Essential (primary) hypertension: Secondary | ICD-10-CM | POA: Diagnosis not present

## 2020-08-06 DIAGNOSIS — N179 Acute kidney failure, unspecified: Secondary | ICD-10-CM | POA: Diagnosis not present

## 2020-08-06 DIAGNOSIS — J1282 Pneumonia due to coronavirus disease 2019: Secondary | ICD-10-CM | POA: Diagnosis not present

## 2020-08-06 DIAGNOSIS — G9341 Metabolic encephalopathy: Secondary | ICD-10-CM | POA: Diagnosis not present

## 2020-08-06 DIAGNOSIS — U071 COVID-19: Secondary | ICD-10-CM | POA: Diagnosis not present

## 2020-08-06 DIAGNOSIS — J9601 Acute respiratory failure with hypoxia: Secondary | ICD-10-CM | POA: Diagnosis not present

## 2020-08-07 ENCOUNTER — Ambulatory Visit
Admission: RE | Admit: 2020-08-07 | Discharge: 2020-08-07 | Disposition: A | Discharge status: Home / Self Care | Payer: Medicare Other | Source: Ambulatory Visit | Attending: Family Medicine | Admitting: Family Medicine

## 2020-08-07 ENCOUNTER — Ambulatory Visit (INDEPENDENT_AMBULATORY_CARE_PROVIDER_SITE_OTHER): Payer: Medicare Other | Admitting: Family Medicine

## 2020-08-07 ENCOUNTER — Encounter: Payer: Self-pay | Admitting: Family Medicine

## 2020-08-07 ENCOUNTER — Other Ambulatory Visit: Payer: Self-pay

## 2020-08-07 VITALS — BP 95/63 | HR 76 | Temp 98.4°F | Wt 207.8 lb

## 2020-08-07 DIAGNOSIS — E876 Hypokalemia: Secondary | ICD-10-CM

## 2020-08-07 DIAGNOSIS — J1282 Pneumonia due to coronavirus disease 2019: Secondary | ICD-10-CM | POA: Insufficient documentation

## 2020-08-07 DIAGNOSIS — G9341 Metabolic encephalopathy: Secondary | ICD-10-CM | POA: Diagnosis not present

## 2020-08-07 DIAGNOSIS — N179 Acute kidney failure, unspecified: Secondary | ICD-10-CM | POA: Diagnosis not present

## 2020-08-07 DIAGNOSIS — Z23 Encounter for immunization: Secondary | ICD-10-CM | POA: Diagnosis not present

## 2020-08-07 DIAGNOSIS — U071 COVID-19: Secondary | ICD-10-CM

## 2020-08-07 DIAGNOSIS — J189 Pneumonia, unspecified organism: Secondary | ICD-10-CM | POA: Diagnosis not present

## 2020-08-07 DIAGNOSIS — R0602 Shortness of breath: Secondary | ICD-10-CM | POA: Diagnosis not present

## 2020-08-07 DIAGNOSIS — J9601 Acute respiratory failure with hypoxia: Secondary | ICD-10-CM | POA: Diagnosis not present

## 2020-08-07 DIAGNOSIS — I10 Essential (primary) hypertension: Secondary | ICD-10-CM | POA: Diagnosis not present

## 2020-08-07 MED ORDER — BREZTRI AEROSPHERE 160-9-4.8 MCG/ACT IN AERO: 2.0000 | INHALATION_SPRAY | Freq: Two times a day (BID) | RESPIRATORY_TRACT | 0 refills | Status: DC

## 2020-08-07 MED ORDER — BREZTRI AEROSPHERE 160-9-4.8 MCG/ACT IN AERO: 2.0000 | INHALATION_SPRAY | Freq: Two times a day (BID) | RESPIRATORY_TRACT | 0 refills | Status: AC

## 2020-08-07 NOTE — Progress Notes (Signed)
Established patient visit   Patient: Jeffrey Torres   DOB: [birth year]   75 y.o. Male  MRN: [id number] Visit Date: 08/07/2020  Today's healthcare provider: Lelon Huh, MD   Chief Complaint  Patient presents with  . Hospitalization Follow-up   Subjective    HPI  Follow up Hospitalization  Patient was admitted to [facility] on 07/06/2020 and discharged on 07/11/2020. He was treated for acute respiratory failure and multifocal pneumonia due to COVID-19 virus.     Per discharge summary patient was to follow up with PCP in 1-2         weeks to have BMP/CBC checked.   Telephone follow up was not done. He reports good compliance with treatment. He reports this condition is worsened. He states he is still short of breath, and his home oxygen levels have been dropping as low as 85%. He is not on ant supplemental oxygen. He denies any fevers.  Physical therapist sent to ED on 07/23/20 due to low oxygen levels. He had labs and xrays done but apparently left before seeing MD. His xray showed slight improvement. He was noted to have low potassium of 2.5. we sent in prescription for oral potassium a few days later which he has been taking, but he has not yet been to lab to have this rechecked. He does have albuterol inhaler which he has only used a few times. Cough is very mild and breathing is fine when at rest, but gets very short of breath when ambulating more that a few feet.   -----------------------------------------------------------------------------------------        Medications: Outpatient Medications Prior to Visit  Medication Sig  . albuterol (VENTOLIN HFA) 108 (90 Base) MCG/ACT inhaler Inhale 2 puffs into the lungs every 6 (six) hours as needed for wheezing or shortness of breath.  Marland Kitchen aspirin 81 MG tablet Take 81 mg by mouth daily.  Marland Kitchen diltiazem (CARDIZEM CD) 120 MG 24 hr capsule Take 1 capsule (120 mg total) by mouth daily.  . feeding supplement (ENSURE ENLIVE / ENSURE PLUS)  LIQD Take 237 mLs by mouth 3 (three) times daily between meals.  . fluticasone (FLONASE) 50 MCG/ACT nasal spray Place 2 sprays into both nostrils every morning.   . hydrochlorothiazide (HYDRODIURIL) 25 MG tablet Take 25 mg by mouth daily.  . Multiple Vitamins-Minerals (PRESERVISION AREDS 2 PO) Take 2 capsules by mouth every morning.   . potassium chloride SA (KLOR-CON) 20 MEQ tablet Take 1 tablet (20 mEq total) by mouth daily.  . tamsulosin (FLOMAX) 0.4 MG CAPS capsule Take 1 capsule (0.4 mg total) by mouth daily after supper.  Marland Kitchen omeprazole (PRILOSEC) 20 MG capsule Take 20 mg by mouth daily before breakfast. (Patient not taking: Reported on 08/07/2020)   No facility-administered medications prior to visit.    Review of Systems  Constitutional: Negative for appetite change, chills and fever.  HENT: Positive for congestion (nasal congestion).   Respiratory: Positive for cough and shortness of breath (durring exertion). Negative for chest tightness and wheezing.   Cardiovascular: Negative for chest pain and palpitations.  Gastrointestinal: Negative for abdominal pain, nausea and vomiting.  Neurological: Positive for dizziness.       Objective    BP 95/63 (BP Location: Left Arm, Patient Position: Sitting, Cuff Size: Large)   Pulse 76   Temp 98.4 F (36.9 C) (Temporal)   Wt 207 lb 12.8 oz (94.3 kg)   SpO2 96% Comment: room ai  BMI 29.82 kg/m  Physical Exam   General: Appearance:     Overweight male in no acute distress  Eyes:    PERRL, conjunctiva/corneas clear, EOM's intact       Lungs:     Clear to auscultation bilaterally, respirations unlabored  Heart:    Normal heart rate. Normal rhythm. No murmurs, rubs, or gallops.   MS:   All extremities are intact.   Neurologic:   Awake, alert, oriented x 3. No apparent focal neurological           defect.         Assessment & Plan     1. Pneumonia due to COVID-19 virus Very slowly improving. Check labs.  - CBC - DG Chest 2  View; Future  Start samples- Budeson-Glycopyrrol-Formoterol (BREZTRI AEROSPHERE) 160-9-4.8 MCG/ACT AERO; Inhale 2 puffs into the lungs in the morning and at bedtime for 28 days.  Dispense: 10.8 g; Refill: 0  Advised to continue prn only use of albuterol inhaler.   2. Hypokalemia  - Renal function panel - Magnesium  3. Need for influenza vaccination  - Flu Vaccine QUAD High Dose IM (Fluad)   He has never had Covid vaccine and recommended he get first dose in April, 3-4 months after onset of symptoms of Covid pneumonia.        The entirety of the information documented in the History of Present Illness, Review of Systems and Physical Exam were personally obtained by me. Portions of this information were initially documented by the CMA and reviewed by me for thoroughness and accuracy.      Lelon Huh, MD  [facility] [phone] (phone) [phone] (fax)  [place]

## 2020-08-07 NOTE — Patient Instructions (Addendum)
.   Please go to the lab draw station in [address] on the second floor of [facility] . Normal hours are 8:00am to 11:30am and 1:00pm to 4:00pm Monday through Friday  . Go to the [facility] on [address] for [employer]  . Start using Breztri inhaler by taking 1 puff twice a day every day for the next month

## 2020-08-08 LAB — CBC
Hematocrit: 44 % (ref 37.5–51.0)
Hemoglobin: 14.8 g/dL (ref 13.0–17.7)
MCH: 30.3 pg (ref 26.6–33.0)
MCHC: 33.6 g/dL (ref 31.5–35.7)
MCV: 90 fL (ref 79–97)
Platelets: 509 10*3/uL — ABNORMAL HIGH (ref 150–450)
RBC: 4.88 x10E6/uL (ref 4.14–5.80)
RDW: 14 % (ref 11.6–15.4)
WBC: 9.6 10*3/uL (ref 3.4–10.8)

## 2020-08-08 LAB — MAGNESIUM: Magnesium: 2.1 mg/dL (ref 1.6–2.3)

## 2020-08-08 LAB — RENAL FUNCTION PANEL
Albumin: 3.7 g/dL (ref 3.7–4.7)
BUN/Creatinine Ratio: 13 (ref 10–24)
BUN: 13 mg/dL (ref 8–27)
CO2: 24 mmol/L (ref 20–29)
Calcium: 9.9 mg/dL (ref 8.6–10.2)
Chloride: 97 mmol/L (ref 96–106)
Creatinine, Ser: 1 mg/dL (ref 0.76–1.27)
GFR calc Af Amer: 86 mL/min/{1.73_m2} (ref >59)
GFR calc non Af Amer: 74 mL/min/{1.73_m2} (ref >59)
Glucose: 145 mg/dL — ABNORMAL HIGH (ref 65–99)
Phosphorus: 3.5 mg/dL (ref 2.8–4.1)
Potassium: 4.4 mmol/L (ref 3.5–5.2)
Sodium: 137 mmol/L (ref 134–144)

## 2020-08-12 ENCOUNTER — Encounter: Payer: Self-pay | Admitting: Radiation Oncology

## 2020-08-12 ENCOUNTER — Ambulatory Visit
Admission: RE | Admit: 2020-08-12 | Discharge: 2020-08-12 | Disposition: A | Discharge status: Home / Self Care | Payer: Medicare Other | Source: Ambulatory Visit | Attending: Radiation Oncology | Admitting: Radiation Oncology

## 2020-08-12 VITALS — BP 121/72 | HR 74 | Temp 97.5°F | Wt 205.0 lb

## 2020-08-12 DIAGNOSIS — W19XXXD Unspecified fall, subsequent encounter: Secondary | ICD-10-CM | POA: Diagnosis not present

## 2020-08-12 DIAGNOSIS — Z9181 History of falling: Secondary | ICD-10-CM | POA: Diagnosis not present

## 2020-08-12 DIAGNOSIS — K219 Gastro-esophageal reflux disease without esophagitis: Secondary | ICD-10-CM | POA: Diagnosis not present

## 2020-08-12 DIAGNOSIS — M1990 Unspecified osteoarthritis, unspecified site: Secondary | ICD-10-CM | POA: Diagnosis not present

## 2020-08-12 DIAGNOSIS — C329 Malignant neoplasm of larynx, unspecified: Secondary | ICD-10-CM

## 2020-08-12 DIAGNOSIS — J1282 Pneumonia due to coronavirus disease 2019: Secondary | ICD-10-CM | POA: Diagnosis not present

## 2020-08-12 DIAGNOSIS — J9601 Acute respiratory failure with hypoxia: Secondary | ICD-10-CM | POA: Diagnosis not present

## 2020-08-12 DIAGNOSIS — I10 Essential (primary) hypertension: Secondary | ICD-10-CM | POA: Diagnosis not present

## 2020-08-12 DIAGNOSIS — Z961 Presence of intraocular lens: Secondary | ICD-10-CM | POA: Diagnosis not present

## 2020-08-12 DIAGNOSIS — K4020 Bilateral inguinal hernia, without obstruction or gangrene, not specified as recurrent: Secondary | ICD-10-CM | POA: Diagnosis not present

## 2020-08-12 DIAGNOSIS — Z87891 Personal history of nicotine dependence: Secondary | ICD-10-CM | POA: Diagnosis not present

## 2020-08-12 DIAGNOSIS — Z7982 Long term (current) use of aspirin: Secondary | ICD-10-CM | POA: Diagnosis not present

## 2020-08-12 DIAGNOSIS — U071 COVID-19: Secondary | ICD-10-CM | POA: Diagnosis not present

## 2020-08-12 DIAGNOSIS — Z7951 Long term (current) use of inhaled steroids: Secondary | ICD-10-CM | POA: Diagnosis not present

## 2020-08-12 DIAGNOSIS — H3530 Unspecified macular degeneration: Secondary | ICD-10-CM | POA: Diagnosis not present

## 2020-08-12 DIAGNOSIS — G9341 Metabolic encephalopathy: Secondary | ICD-10-CM | POA: Diagnosis not present

## 2020-08-12 DIAGNOSIS — H9190 Unspecified hearing loss, unspecified ear: Secondary | ICD-10-CM | POA: Diagnosis not present

## 2020-08-12 DIAGNOSIS — I498 Other specified cardiac arrhythmias: Secondary | ICD-10-CM | POA: Diagnosis not present

## 2020-08-12 DIAGNOSIS — Z9089 Acquired absence of other organs: Secondary | ICD-10-CM | POA: Diagnosis not present

## 2020-08-12 DIAGNOSIS — E870 Hyperosmolality and hypernatremia: Secondary | ICD-10-CM | POA: Diagnosis not present

## 2020-08-12 DIAGNOSIS — Z08 Encounter for follow-up examination after completed treatment for malignant neoplasm: Secondary | ICD-10-CM | POA: Diagnosis not present

## 2020-08-12 DIAGNOSIS — N179 Acute kidney failure, unspecified: Secondary | ICD-10-CM | POA: Diagnosis not present

## 2020-08-12 DIAGNOSIS — Z87442 Personal history of urinary calculi: Secondary | ICD-10-CM | POA: Diagnosis not present

## 2020-08-12 DIAGNOSIS — Z8521 Personal history of malignant neoplasm of larynx: Secondary | ICD-10-CM | POA: Diagnosis not present

## 2020-08-12 NOTE — Progress Notes (Signed)
Radiation Oncology Follow up Note  Name: Jeffrey Torres   Date:   08/12/2020 MRN:  [id number] DOB: [birth year]    This 75 y.o. male presents to the clinic today for 3-year follow-up status post external beam radiation therapy for stage I squamous cell carcinoma larynx.  REFERRING PROVIDER: Birdie Sons, MD  HPI: Patient is a 75 year old male now at 3 years having completed external beam radiation therapy for stage I squamous cell carcinoma larynx seen today in routine follow-up he is doing well total quality of his voice is good he specifically [place] head and neck pain or dysphagia.  He is under routine upper endoscopy by ENT showing no evidence of disease..  Patient had recent CT scan of both the head and brain showing no evidence of disease.  He does have some slight fluid in his sphenoid sinuses.  COMPLICATIONS OF TREATMENT: none  FOLLOW UP COMPLIANCE: keeps appointments   PHYSICAL EXAM:  BP 121/72   Pulse 74   Temp (!) 97.5 F (36.4 C) (Tympanic)   Wt 205 lb (93 kg)   SpO2 98%   BMI 29.41 kg/m  Neck is clear without evidence of cervical or supraclavicular adenopathy well-developed well-nourished patient in NAD. HEENT reveals PERLA, EOMI, discs not visualized.  Oral cavity is clear. No oral mucosal lesions are identified. Neck is clear without evidence of cervical or supraclavicular adenopathy. Lungs are clear to A&P. Cardiac examination is essentially unremarkable with regular rate and rhythm without murmur rub or thrill. Abdomen is benign with no organomegaly or masses noted. Motor sensory and DTR levels are equal and symmetric in the upper and lower extremities. Cranial nerves II through XII are grossly intact. Proprioception is intact. No peripheral adenopathy or edema is identified. No motor or sensory levels are noted. Crude visual fields are within normal range.  RADIOLOGY RESULTS: CT scan of the head and MRI of brain both reviewed compatible with above-stated findings  PLAN:  Present time patient is doing well now 3 years out.  I am going to discontinue follow-up care he continues close observation by ENT.  I be happy to reevaluate the patient in time should further radiation therapy be indicated.  Patient is comfortable following up solely with ENT who is performing their laryngoscopies.  I would like to take this opportunity to thank you for allowing me to participate in the care of your patient.Noreene Filbert, MD

## 2020-08-13 DIAGNOSIS — I10 Essential (primary) hypertension: Secondary | ICD-10-CM | POA: Diagnosis not present

## 2020-08-13 DIAGNOSIS — J9601 Acute respiratory failure with hypoxia: Secondary | ICD-10-CM | POA: Diagnosis not present

## 2020-08-13 DIAGNOSIS — N179 Acute kidney failure, unspecified: Secondary | ICD-10-CM | POA: Diagnosis not present

## 2020-08-13 DIAGNOSIS — J1282 Pneumonia due to coronavirus disease 2019: Secondary | ICD-10-CM | POA: Diagnosis not present

## 2020-08-13 DIAGNOSIS — U071 COVID-19: Secondary | ICD-10-CM | POA: Diagnosis not present

## 2020-08-13 DIAGNOSIS — G9341 Metabolic encephalopathy: Secondary | ICD-10-CM | POA: Diagnosis not present

## 2020-08-14 DIAGNOSIS — U071 COVID-19: Secondary | ICD-10-CM | POA: Diagnosis not present

## 2020-08-14 DIAGNOSIS — N179 Acute kidney failure, unspecified: Secondary | ICD-10-CM | POA: Diagnosis not present

## 2020-08-14 DIAGNOSIS — G9341 Metabolic encephalopathy: Secondary | ICD-10-CM | POA: Diagnosis not present

## 2020-08-14 DIAGNOSIS — J9601 Acute respiratory failure with hypoxia: Secondary | ICD-10-CM | POA: Diagnosis not present

## 2020-08-14 DIAGNOSIS — J1282 Pneumonia due to coronavirus disease 2019: Secondary | ICD-10-CM | POA: Diagnosis not present

## 2020-08-14 DIAGNOSIS — I10 Essential (primary) hypertension: Secondary | ICD-10-CM | POA: Diagnosis not present

## 2020-08-20 ENCOUNTER — Encounter: Payer: Self-pay | Admitting: Family Medicine

## 2020-08-20 DIAGNOSIS — U071 COVID-19: Secondary | ICD-10-CM | POA: Diagnosis not present

## 2020-08-20 DIAGNOSIS — G9341 Metabolic encephalopathy: Secondary | ICD-10-CM | POA: Diagnosis not present

## 2020-08-20 DIAGNOSIS — N179 Acute kidney failure, unspecified: Secondary | ICD-10-CM | POA: Diagnosis not present

## 2020-08-20 DIAGNOSIS — J9601 Acute respiratory failure with hypoxia: Secondary | ICD-10-CM | POA: Diagnosis not present

## 2020-08-20 DIAGNOSIS — E876 Hypokalemia: Secondary | ICD-10-CM

## 2020-08-20 DIAGNOSIS — J1282 Pneumonia due to coronavirus disease 2019: Secondary | ICD-10-CM | POA: Diagnosis not present

## 2020-08-20 DIAGNOSIS — I10 Essential (primary) hypertension: Secondary | ICD-10-CM | POA: Diagnosis not present

## 2020-08-21 ENCOUNTER — Other Ambulatory Visit: Payer: Self-pay

## 2020-08-21 MED ORDER — POTASSIUM CHLORIDE CRYS ER 20 MEQ PO TBCR: 20.0000 meq | EXTENDED_RELEASE_TABLET | Freq: Every day | ORAL | 1 refills | Status: DC

## 2020-08-21 MED ORDER — DILTIAZEM HCL ER COATED BEADS 120 MG PO CP24: 120.0000 mg | ORAL_CAPSULE | Freq: Every day | ORAL | 1 refills | Status: DC

## 2020-08-21 MED ORDER — HYDROCHLOROTHIAZIDE 25 MG PO TABS: 25.0000 mg | ORAL_TABLET | Freq: Every day | ORAL | 1 refills | Status: DC

## 2020-08-22 DIAGNOSIS — J9601 Acute respiratory failure with hypoxia: Secondary | ICD-10-CM | POA: Diagnosis not present

## 2020-08-22 DIAGNOSIS — J1282 Pneumonia due to coronavirus disease 2019: Secondary | ICD-10-CM | POA: Diagnosis not present

## 2020-08-22 DIAGNOSIS — I10 Essential (primary) hypertension: Secondary | ICD-10-CM | POA: Diagnosis not present

## 2020-08-22 DIAGNOSIS — U071 COVID-19: Secondary | ICD-10-CM | POA: Diagnosis not present

## 2020-08-22 DIAGNOSIS — G9341 Metabolic encephalopathy: Secondary | ICD-10-CM | POA: Diagnosis not present

## 2020-08-22 DIAGNOSIS — N179 Acute kidney failure, unspecified: Secondary | ICD-10-CM | POA: Diagnosis not present

## 2020-08-29 DIAGNOSIS — U071 COVID-19: Secondary | ICD-10-CM | POA: Diagnosis not present

## 2020-08-29 DIAGNOSIS — J1282 Pneumonia due to coronavirus disease 2019: Secondary | ICD-10-CM | POA: Diagnosis not present

## 2020-08-29 DIAGNOSIS — J9601 Acute respiratory failure with hypoxia: Secondary | ICD-10-CM | POA: Diagnosis not present

## 2020-08-29 DIAGNOSIS — N179 Acute kidney failure, unspecified: Secondary | ICD-10-CM | POA: Diagnosis not present

## 2020-08-29 DIAGNOSIS — G9341 Metabolic encephalopathy: Secondary | ICD-10-CM | POA: Diagnosis not present

## 2020-08-29 DIAGNOSIS — I10 Essential (primary) hypertension: Secondary | ICD-10-CM | POA: Diagnosis not present

## 2020-10-24 DIAGNOSIS — H353213 Exudative age-related macular degeneration, right eye, with inactive scar: Secondary | ICD-10-CM | POA: Diagnosis not present

## 2020-11-04 DIAGNOSIS — H353221 Exudative age-related macular degeneration, left eye, with active choroidal neovascularization: Secondary | ICD-10-CM | POA: Diagnosis not present

## 2020-11-05 ENCOUNTER — Encounter: Payer: Self-pay | Admitting: Family Medicine

## 2020-11-05 ENCOUNTER — Ambulatory Visit (INDEPENDENT_AMBULATORY_CARE_PROVIDER_SITE_OTHER): Payer: Medicare Other | Admitting: Family Medicine

## 2020-11-05 ENCOUNTER — Other Ambulatory Visit: Payer: Self-pay

## 2020-11-05 VITALS — BP 130/62 | HR 71 | Temp 98.4°F | Resp 18 | Ht 70.0 in | Wt 234.4 lb

## 2020-11-05 DIAGNOSIS — R739 Hyperglycemia, unspecified: Secondary | ICD-10-CM | POA: Diagnosis not present

## 2020-11-05 DIAGNOSIS — I10 Essential (primary) hypertension: Secondary | ICD-10-CM

## 2020-11-05 DIAGNOSIS — Z125 Encounter for screening for malignant neoplasm of prostate: Secondary | ICD-10-CM

## 2020-11-05 DIAGNOSIS — E876 Hypokalemia: Secondary | ICD-10-CM

## 2020-11-05 NOTE — Progress Notes (Signed)
Established patient visit   Patient: Jeffrey Torres   DOB: 1946-05-29   75 y.o. Male  MRN: [id number] Visit Date: 11/05/2020  Today's healthcare provider: Lelon Huh, MD   Chief Complaint  Patient presents with  . Hypokalemia  . Hypertension   Subjective    HPI  Follow up for Hypokalemia:  The patient was last seen for this 3 months ago. Changes made at last visit include none; continue current potassium supplement.  He reports good compliance with treatment. He feels that condition is Unchanged. He is not having side effects.   -----------------------------------------------------------------------------------------  Hypertension, follow-up  BP Readings from Last 3 Encounters:  11/05/20 130/62  08/12/20 121/72  08/07/20 95/63   Wt Readings from Last 3 Encounters:  11/05/20 234 lb 6.4 oz (106.3 kg)  08/12/20 205 lb (93 kg)  08/07/20 207 lb 12.8 oz (94.3 kg)     He was last seen for hypertension 9 months ago.  BP at that visit was 128/75. Management since that visit includes continuing same medications.  He reports good compliance with treatment. He is not having side effects.  He is following a Regular diet. He is exercising. He does not smoke.  Use of agents associated with hypertension: NSAIDS.   Outside blood pressures are 110-155/ 75-95. Symptoms: No chest pain No chest pressure  No palpitations No syncope  No dyspnea No orthopnea  No paroxysmal nocturnal dyspnea No lower extremity edema   Pertinent labs: Lab Results  Component Value Date   CHOL 147 08/14/2019   HDL 43 08/14/2019   LDLCALC 64 08/14/2019   TRIG 249 (H) 08/14/2019   CHOLHDL 3.4 08/14/2019   Lab Results  Component Value Date   NA 137 08/07/2020   K 4.4 08/07/2020   CREATININE 1.00 08/07/2020   GFRNONAA 74 08/07/2020   GFRAA 86 08/07/2020   GLUCOSE 145 (H) 08/07/2020     The 10-year ASCVD risk score Mikey Bussing DC Jr., et al., 2013) is: 24.3%    ---------------------------------------------------------------------------------------------------  He is also noted to have elevated non-fasting glucose the last several blood draws.  He did have pneumonia in January which he has fully recovered from, but his labs labs in February found glucose was still at 145.      Medications: Outpatient Medications Prior to Visit  Medication Sig  . albuterol (VENTOLIN HFA) 108 (90 Base) MCG/ACT inhaler Inhale 2 puffs into the lungs every 6 (six) hours as needed for wheezing or shortness of breath.  Marland Kitchen aspirin 81 MG tablet Take 81 mg by mouth daily.  Marland Kitchen diltiazem (CARDIZEM CD) 120 MG 24 hr capsule Take 1 capsule (120 mg total) by mouth daily.  . feeding supplement (ENSURE ENLIVE / ENSURE PLUS) LIQD Take 237 mLs by mouth 3 (three) times daily between meals.  . fluticasone (FLONASE) 50 MCG/ACT nasal spray Place 2 sprays into both nostrils every morning.   . hydrochlorothiazide (HYDRODIURIL) 25 MG tablet Take 1 tablet (25 mg total) by mouth daily.  . Multiple Vitamins-Minerals (PRESERVISION AREDS 2 PO) Take 2 capsules by mouth every morning.   Marland Kitchen omeprazole (PRILOSEC) 20 MG capsule Take 20 mg by mouth daily before breakfast.  . potassium chloride SA (KLOR-CON) 20 MEQ tablet Take 1 tablet (20 mEq total) by mouth daily.  . tamsulosin (FLOMAX) 0.4 MG CAPS capsule Take 1 capsule (0.4 mg total) by mouth daily after supper.   No facility-administered medications prior to visit.    Review of Systems  Constitutional: Negative for  appetite change, chills and fever.  Respiratory: Negative for chest tightness, shortness of breath and wheezing.   Cardiovascular: Negative for chest pain and palpitations.  Gastrointestinal: Negative for abdominal pain, nausea and vomiting.       Objective    BP 130/62 (BP Location: Right Arm, Patient Position: Sitting, Cuff Size: Large)   Pulse 71   Temp 98.4 F (36.9 C) (Temporal)   Resp 18   Ht 5\' 10"  (1.778 m)   Wt  234 lb 6.4 oz (106.3 kg)   BMI 33.63 kg/m     Physical Exam   General: Appearance:    Obese male in no acute distress  Eyes:    PERRL, conjunctiva/corneas clear, EOM's intact       Lungs:     Clear to auscultation bilaterally, respirations unlabored  Heart:    Normal heart rate. Normal rhythm. No murmurs, rubs, or gallops.   MS:   All extremities are intact.   Neurologic:   Awake, alert, oriented x 3. No apparent focal neurological           defect.         Assessment & Plan     1. Hypokalemia  - Basic metabolic panel  2. Hyperglycemia Likely exacerbated by Covid pneumonia in January.  - Hemoglobin A1c  3. Prostate cancer screening  - PSA Total (Reflex To Free) ([facility] only)   4. Hypertension Very well controlled. Continue current medications.        The entirety of the information documented in the History of Present Illness, Review of Systems and Physical Exam were personally obtained by me. Portions of this information were initially documented by the CMA and reviewed by me for thoroughness and accuracy.      Lelon Huh, MD  [facility] [phone] (phone) [phone] (fax)  [place]

## 2020-11-06 LAB — BASIC METABOLIC PANEL
BUN/Creatinine Ratio: 10 (ref 10–24)
BUN: 13 mg/dL (ref 8–27)
CO2: 26 mmol/L (ref 20–29)
Calcium: 9.2 mg/dL (ref 8.6–10.2)
Chloride: 100 mmol/L (ref 96–106)
Creatinine, Ser: 1.29 mg/dL — ABNORMAL HIGH (ref 0.76–1.27)
Glucose: 137 mg/dL — ABNORMAL HIGH (ref 65–99)
Potassium: 3.5 mmol/L (ref 3.5–5.2)
Sodium: 143 mmol/L (ref 134–144)
eGFR: 59 mL/min/{1.73_m2} — ABNORMAL LOW (ref >59)

## 2020-11-06 LAB — HEMOGLOBIN A1C
Est. average glucose Bld gHb Est-mCnc: 146 mg/dL
Hgb A1c MFr Bld: 6.7 % — ABNORMAL HIGH (ref 4.8–5.6)

## 2020-11-06 LAB — PSA TOTAL (REFLEX TO FREE): Prostate Specific Ag, Serum: 2 ng/mL (ref 0.0–4.0)

## 2020-11-29 DIAGNOSIS — H35371 Puckering of macula, right eye: Secondary | ICD-10-CM | POA: Diagnosis not present

## 2020-11-29 DIAGNOSIS — H353221 Exudative age-related macular degeneration, left eye, with active choroidal neovascularization: Secondary | ICD-10-CM | POA: Diagnosis not present

## 2020-11-29 DIAGNOSIS — H353213 Exudative age-related macular degeneration, right eye, with inactive scar: Secondary | ICD-10-CM | POA: Diagnosis not present

## 2020-11-29 DIAGNOSIS — H43811 Vitreous degeneration, right eye: Secondary | ICD-10-CM | POA: Diagnosis not present

## 2021-02-13 ENCOUNTER — Telehealth: Payer: Self-pay | Admitting: Family Medicine

## 2021-02-13 DIAGNOSIS — E876 Hypokalemia: Secondary | ICD-10-CM

## 2021-02-13 MED ORDER — POTASSIUM CHLORIDE CRYS ER 20 MEQ PO TBCR: 20.0000 meq | EXTENDED_RELEASE_TABLET | Freq: Every day | ORAL | 1 refills | Status: DC

## 2021-02-13 MED ORDER — DILTIAZEM HCL ER COATED BEADS 120 MG PO CP24: 120.0000 mg | ORAL_CAPSULE | Freq: Every day | ORAL | 1 refills | Status: DC

## 2021-02-13 MED ORDER — HYDROCHLOROTHIAZIDE 25 MG PO TABS: 25.0000 mg | ORAL_TABLET | Freq: Every day | ORAL | 1 refills | Status: DC

## 2021-02-13 NOTE — Telephone Encounter (Signed)
[facility] faxed refill request for the following medications:   potassium chloride SA (KLOR-CON) 20 MEQ tablet   hydrochlorothiazide (HYDRODIURIL) 25 MG tablet   diltiazem (CARDIZEM CD) 120 MG 24 hr capsule   Please advise.

## 2021-02-17 DIAGNOSIS — H353221 Exudative age-related macular degeneration, left eye, with active choroidal neovascularization: Secondary | ICD-10-CM | POA: Diagnosis not present

## 2021-03-06 ENCOUNTER — Ambulatory Visit (INDEPENDENT_AMBULATORY_CARE_PROVIDER_SITE_OTHER): Payer: Medicare Other | Admitting: Cardiology

## 2021-03-06 ENCOUNTER — Encounter: Payer: Self-pay | Admitting: Cardiology

## 2021-03-06 ENCOUNTER — Other Ambulatory Visit: Payer: Self-pay

## 2021-03-06 VITALS — BP 138/70 | HR 65 | Ht 71.0 in | Wt 214.0 lb

## 2021-03-06 DIAGNOSIS — G4733 Obstructive sleep apnea (adult) (pediatric): Secondary | ICD-10-CM

## 2021-03-06 DIAGNOSIS — I10 Essential (primary) hypertension: Secondary | ICD-10-CM | POA: Diagnosis not present

## 2021-03-06 MED ORDER — DILTIAZEM HCL ER COATED BEADS 120 MG PO CP24: 120.0000 mg | ORAL_CAPSULE | Freq: Every day | ORAL | 1 refills | Status: DC

## 2021-03-06 NOTE — Patient Instructions (Signed)

## 2021-03-06 NOTE — Progress Notes (Signed)
Cardiology Office Note:    Date:  03/06/2021   ID:  Jeffrey Torres, DOB 1946-03-29, MRN [id number]  PCP:  Birdie Sons, MD  [facility] Cardiologist:  Kate Sable, MD  [place] Electrophysiologist:  None   Referring MD: Birdie Sons, MD   Chief Complaint  Patient presents with   Other    12 month follow up -- Meds reviewed verbally with patient.     History of Present Illness:    Jeffrey Torres is a 75 y.o. male with a hx of GERD, hypertension, OSA, former smoker who presents for follow-up.    Previously seen for hypertension.  Blood pressure meds were adjusted, HCTZ was started.  Cardizem was continued.  Tolerating medicines without any problems.  He states being out of his Cardizem over the past 2 weeks or so.  Blood pressures have been in the 106Y to 694W systolic.  Denies chest pain or shortness of breath.  Was diagnosed with COVID back in January.  Hospitalized for some time.  Had a slow recovery with physical therapy at home.  Prior notes.   diagnosed with OSA, CPAP mask recommended but patient does not like to wear a mask.  He will try to lose weight to see if this will help with his sleep apnea.     Past Medical History:  Diagnosis Date   Acute metabolic encephalopathy 10/30/6268   Arthritis    Cancer ([place])    larynx ca, surgery and radiation   GERD (gastroesophageal reflux disease)    Heart murmur    Hernia, inguinal, bilateral    History of chicken pox    History of kidney stones    History of measles    History of mumps    History of shingles    HOH (hard of hearing)    Macular degeneration of both eyes    Motion sickness    ocean boat   Pneumonia due to COVID-19 virus 07/31/2020   PVC (premature ventricular contraction)     Past Surgical History:  Procedure Laterality Date   CARDIAC CATHETERIZATION  2010   normal per patient report   CATARACT EXTRACTION W/PHACO Right 07/18/2019   Procedure: CATARACT EXTRACTION PHACO AND INTRAOCULAR  LENS PLACEMENT ([place]) RIGHT 4.54 00:29.7;  Surgeon: Birder Robson, MD;  Location: [place];  Service: Ophthalmology;  Laterality: Right;   CATARACT EXTRACTION W/PHACO Left 08/08/2019   Procedure: CATARACT EXTRACTION PHACO AND INTRAOCULAR LENS PLACEMENT ([place]) LEFT;  Surgeon: Birder Robson, MD;  Location: [facility] OR[facility];  Service: Ophthalmology;  Laterality: Left;  Lot #[id number] H [place]: 00:34.5 CDE: 4.75   COLONOSCOPY  06/24/2006   Dr. Bary Castilla. Multiple benign appearing 32mm polyps in the cecum and in the rectum. -Three 68mm polyps in the transversed colon, in the tranverse colon, proximal and in the distal transverse colon. Resected and retrieved.   COLONOSCOPY WITH PROPOFOL N/A 03/12/2020   Procedure: COLONOSCOPY WITH PROPOFOL;  Surgeon: Lucilla Lame, MD;  [facility]: [facility] END[facility]COPY;  Service: Endoscopy;  Laterality: N/A;   CYSTOSCOPY WITH HOLMIUM LASER LITHOTRIPSY  2001   kidney stones removed, [facility]   LARYNGOSCOPY Right 04/01/2017   Procedure: SUSPENSION LARYNGOSCOPY WITH MICROFLAP EXCISION;  Surgeon: Carloyn Manner, MD;  Location: [facility] ORS;  Service: ENT;  Laterality: Right;   sinus surgery   2002   [place]; removal of polyps   TONSILLECTOMY  1954   Tubular adenoma removed  06/24/2006    Current Medications: Current Meds  Medication Sig   albuterol (VENTOLIN  HFA) 108 (90 Base) MCG/ACT inhaler Inhale 2 puffs into the lungs every 6 (six) hours as needed for wheezing or shortness of breath.   aspirin 81 MG tablet Take 81 mg by mouth daily.   feeding supplement (ENSURE ENLIVE / ENSURE PLUS) LIQD Take 237 mLs by mouth 3 (three) times daily between meals.   fluticasone (FLONASE) 50 MCG/ACT nasal spray Place 2 sprays into both nostrils every morning.    hydrochlorothiazide (HYDRODIURIL) 25 MG tablet Take 1 tablet (25 mg total) by mouth daily.   Multiple Vitamins-Minerals (PRESERVISION AREDS 2 PO) Take 2 capsules by mouth every morning.    omeprazole (PRILOSEC) 20 MG capsule Take 20 mg by  mouth daily before breakfast.   potassium chloride SA (KLOR-CON) 20 MEQ tablet Take 1 tablet (20 mEq total) by mouth daily.   [DISCONTINUED] diltiazem (CARDIZEM CD) 120 MG 24 hr capsule Take 1 capsule (120 mg total) by mouth daily.     Allergies:   Penicillins   Social History   Socioeconomic History   Marital status: Legally Separated    Spouse name: Not on file   Number of children: 2   Years of education: Not on file   Highest education level: 12th grade  Occupational History   Occupation: Truck driver    Comment: unemployed  Tobacco Use   Smoking status: Former    Packs/day: 1.00    Types: Cigarettes    Quit date: 12/2017    Years since quitting: 3.1   Smokeless tobacco: Never   Tobacco comments:    started age 40 yo.previously smoked carton a week  Vaping Use   Vaping Use: Never used  Substance and Sexual Activity   Alcohol use: Not Currently    Alcohol/week: 0.0 standard drinks   Drug use: No   Sexual activity: Not on file  Other Topics Concern   Not on file  Social History Narrative   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   Social Determinants of Health   Financial Resource Strain: Medium Risk   Difficulty of Paying Living Expenses: Somewhat hard  Food Insecurity: No Food Insecurity   Worried About Charity fundraiser in the Last Year: Never true   Arboriculturist in the Last Year: Never true  Transportation Needs: No Transportation Needs   Lack of Transportation (Medical): No   Lack of Transportation (Non-Medical): No  Physical Activity: Inactive   Days of Exercise per Week: 0 days   Minutes of Exercise per Session: 0 min  Stress: No Stress Concern Present   Feeling of Stress : Not at all  Social Connections: Socially Isolated   Frequency of Communication with Friends and Family: More than three times a week   Frequency of Social Gatherings with Friends and Family: More than three times a week   Attends Religious Services: Never   Corporate treasurer or Organizations: No   Attends Archivist Meetings: Never   Marital Status: Separated     Family History: The patient's family history includes Heart disease in his father; Lung cancer in his mother.  ROS:   Please see the history of present illness.     All other systems reviewed and are negative.  EKGs/Labs/Other Studies Reviewed:    The following studies were reviewed today:   EKG:  EKG is  ordered today.  The ekg ordered today demonstrates normal sinus rhythm, normal ECG.  Recent Labs: 07/22/2020: ALT 43 08/07/2020: Hemoglobin 14.8; Magnesium 2.1; Platelets 509 11/05/2020:  BUN 13; Creatinine, Ser 1.29; Potassium 3.5; Sodium 143  Recent Lipid Panel    Component Value Date/Time   CHOL 147 08/14/2019 1006   TRIG 249 (H) 08/14/2019 1006   HDL 43 08/14/2019 1006   CHOLHDL 3.4 08/14/2019 1006   LDLCALC 64 08/14/2019 1006    Physical Exam:    VS:  BP 138/70 (BP Location: Left Arm, Patient Position: Sitting, Cuff Size: Normal)   Pulse 65   Ht 5\' 11"  (1.803 m)   Wt 214 lb (97.1 kg)   SpO2 96%   BMI 29.85 kg/m     Wt Readings from Last 3 Encounters:  03/06/21 214 lb (97.1 kg)  11/05/20 234 lb 6.4 oz (106.3 kg)  08/12/20 205 lb (93 kg)     GEN:  Well nourished, well developed in no acute distress HEENT: Normal NECK: No JVD; No carotid bruits LYMPHATICS: No lymphadenopathy CARDIAC: RRR, no murmurs, rubs, gallops RESPIRATORY:  Clear to auscultation without rales, wheezing or rhonchi  ABDOMEN: Soft, non-tender, non-distended MUSCULOSKELETAL:  No edema; No deformity  SKIN: Warm and dry NEUROLOGIC:  Alert and oriented x 3 PSYCHIATRIC:  Normal affect   ASSESSMENT:    1. Essential hypertension   2. OSA (obstructive sleep apnea)     PLAN:    In order of problems listed above:  Hypertension, blood pressure slightly elevated. Continue HCTZ 25 mg daily, refill Cardizem 120 daily.  OSA, uses CPAP mask as per sleep specialist.  Follow-up in 6 months.   If BP controlled, plan to follow-up as needed after.  Total encounter time 35 minutes  Greater than 50% was spent in counseling and coordination of care with the patient    Medication Adjustments/Labs and Tests Ordered: Current medicines are reviewed at length with the patient today.  Concerns regarding medicines are outlined above.  Orders Placed This Encounter  Procedures   EKG 12-Lead    Meds ordered this encounter  Medications   diltiazem (CARDIZEM CD) 120 MG 24 hr capsule    Sig: Take 1 capsule (120 mg total) by mouth daily.    Dispense:  90 capsule    Refill:  1     Patient Instructions  Medication Instructions:  Your physician recommends that you continue on your current medications as directed. Please refer to the Current Medication list given to you today.  *If you need a refill on your cardiac medications before your next appointment, please call your pharmacy*   Lab Work: None ordered If you have labs (blood work) drawn today and your tests are completely normal, you will receive your results only by: [place] (if you have MyChart) OR A paper copy in the mail If you have any lab test that is abnormal or we need to change your treatment, we will call you to review the results.   Testing/Procedures: None ordered   Follow-Up: At [facility], you and your health needs are our priority.  As part of our continuing mission to provide you with exceptional heart care, we have created designated Provider Care Teams.  These Care Teams include your primary Cardiologist (physician) and Advanced Practice Providers (APPs -  Physician Assistants and Nurse Practitioners) who all work together to provide you with the care you need, when you need it.  We recommend signing up for the patient portal called "MyChart".  Sign up information is provided on this After Visit Summary.  MyChart is used to connect with patients for Virtual Visits (Telemedicine).  Patients are able  to view lab/test results, encounter notes, upcoming appointments, etc.  Non-urgent messages can be sent to your provider as well.   To learn more about what you can do with MyChart, go to [email or web address].    Your next appointment:   6 month(s)  The format for your next appointment:   In Person  Provider:   Kate Sable, MD   Other Instructions    Signed, Kate Sable, MD  03/06/2021 1:09 PM    [place]

## 2021-03-09 IMAGING — CR DG CHEST 2V
2 series · 2 of 2 positions shown · non-contrast
Comparison: 07/06/2020

CLINICAL DATA: RRD7U-7H pneumonia, hypoxia, weakness

EXAM:
CHEST - 2 VIEW

[chest pa]
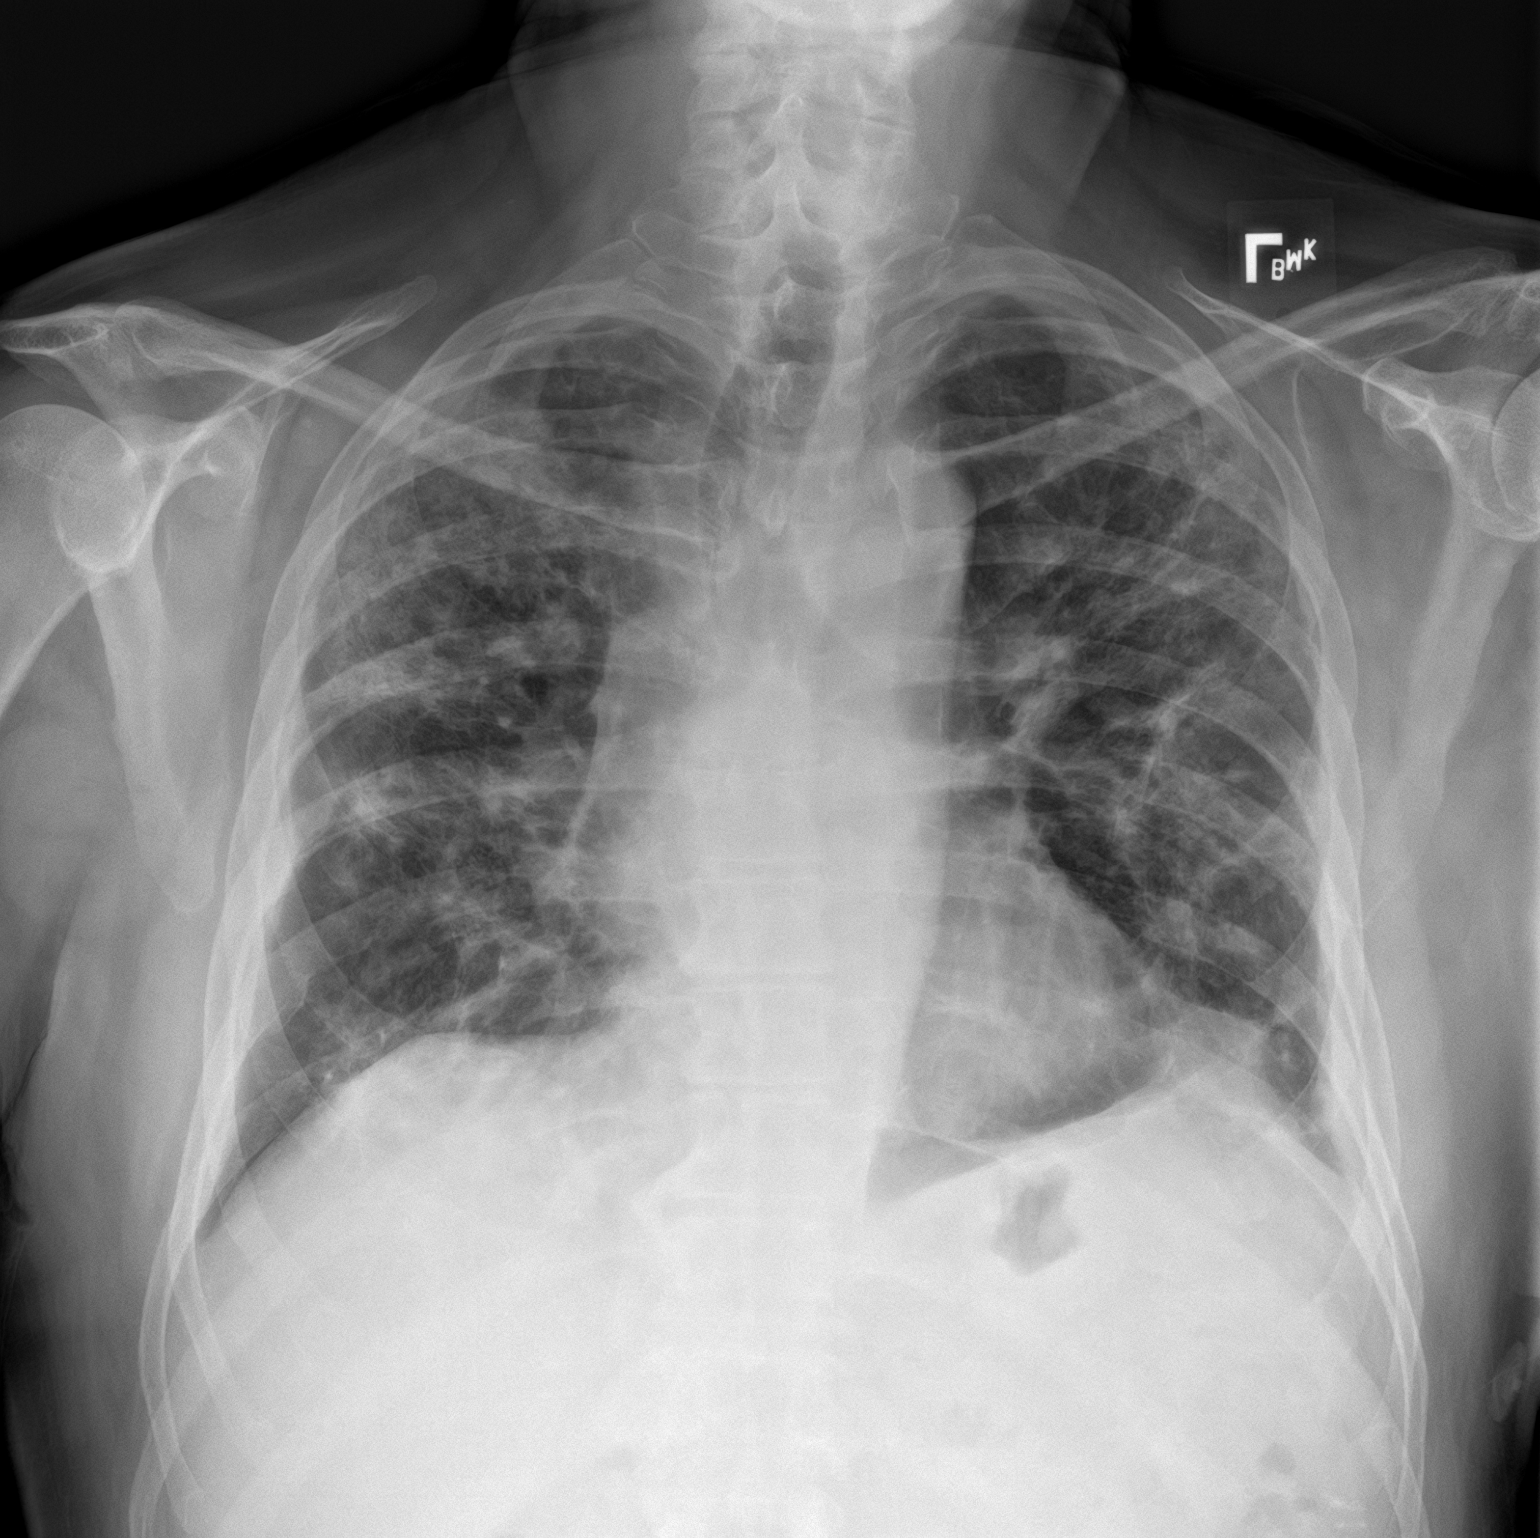

[chest lat]
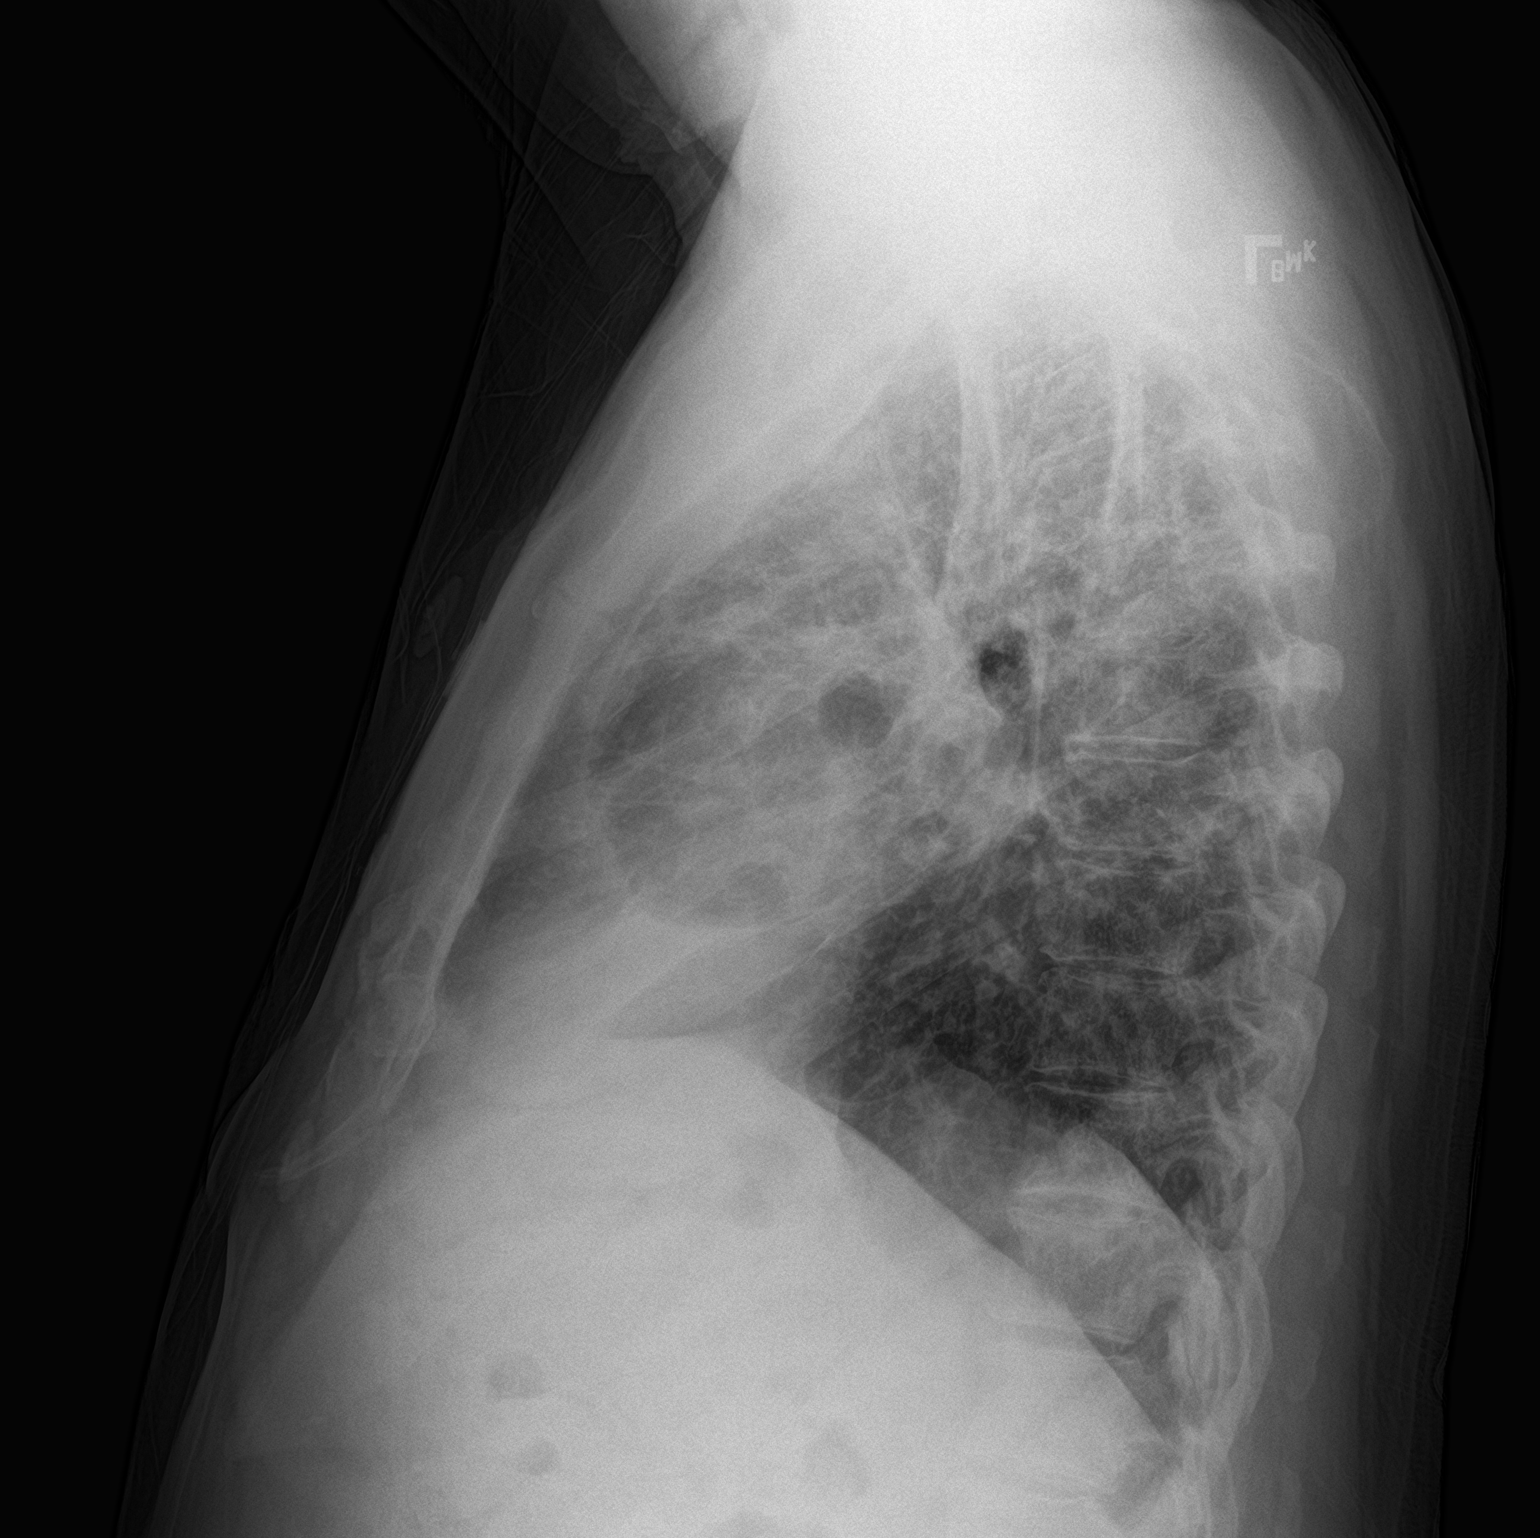

[2 of 2 positions shown; findings below may reference images not displayed]

FINDINGS: Frontal and lateral views of the chest demonstrate a stable cardiac
silhouette. Multifocal bilateral airspace disease is again
identified, with slight improvement since previous study. No
effusion or pneumothorax. No acute bony abnormalities.
IMPRESSION: 1. Slight improvement in multifocal bilateral RRD7U-7H pneumonia.

## 2021-03-18 ENCOUNTER — Ambulatory Visit (INDEPENDENT_AMBULATORY_CARE_PROVIDER_SITE_OTHER): Payer: Medicare Other | Admitting: Family Medicine

## 2021-03-18 ENCOUNTER — Encounter: Payer: Self-pay | Admitting: Family Medicine

## 2021-03-18 ENCOUNTER — Other Ambulatory Visit: Payer: Self-pay

## 2021-03-18 VITALS — BP 128/67 | HR 60 | Temp 98.2°F | Resp 16 | Wt 218.0 lb

## 2021-03-18 DIAGNOSIS — Z23 Encounter for immunization: Secondary | ICD-10-CM

## 2021-03-18 DIAGNOSIS — R7303 Prediabetes: Secondary | ICD-10-CM | POA: Diagnosis not present

## 2021-03-18 DIAGNOSIS — I10 Essential (primary) hypertension: Secondary | ICD-10-CM | POA: Diagnosis not present

## 2021-03-18 LAB — POCT GLYCOSYLATED HEMOGLOBIN (HGB A1C)
Est. average glucose Bld gHb Est-mCnc: 123
Hemoglobin A1C: 5.9 % — AB (ref 4.0–5.6)

## 2021-03-18 NOTE — Patient Instructions (Signed)
Please review the attached list of medications and notify my office if there are any errors.   I strongly recommend a Covid Omicron booster if it's been more than 2 months since your last covid booster or infection.

## 2021-03-18 NOTE — Progress Notes (Signed)
Established patient visit   Patient: Jeffrey Torres   DOB: 03-Feb-1946   75 y.o. Male  MRN: [id number] Visit Date: 03/18/2021  Today's healthcare provider: Lelon Huh, MD   Chief Complaint  Patient presents with   Hypertension   Prediabetes    Subjective    HPI  Hypertension, follow-up  BP Readings from Last 3 Encounters:  03/18/21 128/67  03/06/21 138/70  11/05/20 130/62   Wt Readings from Last 3 Encounters:  03/18/21 218 lb (98.9 kg)  03/06/21 214 lb (97.1 kg)  11/05/20 234 lb 6.4 oz (106.3 kg)     He was last seen for hypertension 4 months ago.  BP at that visit was 130/62. Management since that visit includes continue same medication.  He reports good compliance with treatment. He is not having side effects.  He is following a Regular diet. He is exercising. He does smoke.  Use of agents associated with hypertension: NSAIDS.   Outside blood pressures are 151-761 systolic; patient is unsure of diastolic readings. Symptoms: No chest pain No chest pressure  No palpitations No syncope  No dyspnea No orthopnea  No paroxysmal nocturnal dyspnea No lower extremity edema   Pertinent labs: Lab Results  Component Value Date   NA 143 11/05/2020   K 3.5 11/05/2020   CREATININE 1.29 (H) 11/05/2020   GFRNONAA 74 08/07/2020   GFRAA 86 08/07/2020   GLUCOSE 137 (H) 11/05/2020     The 10-year ASCVD risk score (Arnett DK, et al., 2019) is: 27.9%   ---------------------------------------------------------------------------------------------------   Hyperglycemia, Follow-up  Lab Results  Component Value Date   HGBA1C 5.9 (A) 03/18/2021   HGBA1C 6.7 (H) 11/05/2020   HGBA1C 5.8 09/01/2013   Wt Readings from Last 3 Encounters:  03/18/21 218 lb (98.9 kg)  03/06/21 214 lb (97.1 kg)  11/05/20 234 lb 6.4 oz (106.3 kg)   Last seen for hyperglycemia 4 months ago.  Management since then includes advising patient to strictly avoid sweets and starchy food. Has  cut out soft drinks and cut way back on sweet tea.  He reports good compliance with treatment. He is having side effects.  Symptoms: No fatigue No foot ulcerations  No appetite changes No nausea  No paresthesia of the feet  No polydipsia  No polyuria No visual disturbances   No vomiting     Home blood sugar records:  blood sugars are not checked  Episodes of hypoglycemia? No    Current insulin regiment: none Current exercise: walking Current diet habits: well balanced  Pertinent Labs: Lab Results  Component Value Date   CHOL 147 08/14/2019   HDL 43 08/14/2019   LDLCALC 64 08/14/2019   TRIG 249 (H) 08/14/2019   CHOLHDL 3.4 08/14/2019   Lab Results  Component Value Date   NA 143 11/05/2020   K 3.5 11/05/2020   CREATININE 1.29 (H) 11/05/2020   GFRNONAA 74 08/07/2020   GFRAA 86 08/07/2020   GLUCOSE 137 (H) 11/05/2020     ---------------------------------------------------------------------------------------------------     Medications: Outpatient Medications Prior to Visit  Medication Sig   aspirin 81 MG tablet Take 81 mg by mouth daily.   diltiazem (CARDIZEM CD) 120 MG 24 hr capsule Take 1 capsule (120 mg total) by mouth daily.   feeding supplement (ENSURE ENLIVE / ENSURE PLUS) LIQD Take 237 mLs by mouth 3 (three) times daily between meals.   fluticasone (FLONASE) 50 MCG/ACT nasal spray Place 2 sprays into both nostrils every morning.  hydrochlorothiazide (HYDRODIURIL) 25 MG tablet Take 1 tablet (25 mg total) by mouth daily.   Multiple Vitamins-Minerals (PRESERVISION AREDS 2 PO) Take 2 capsules by mouth every morning.    omeprazole (PRILOSEC) 20 MG capsule Take 20 mg by mouth daily before breakfast.   potassium chloride SA (KLOR-CON) 20 MEQ tablet Take 1 tablet (20 mEq total) by mouth daily.   tamsulosin (FLOMAX) 0.4 MG CAPS capsule Take 1 capsule (0.4 mg total) by mouth daily after supper.   albuterol (VENTOLIN HFA) 108 (90 Base) MCG/ACT inhaler Inhale 2 puffs  into the lungs every 6 (six) hours as needed for wheezing or shortness of breath. (Patient not taking: Reported on 03/18/2021)   No facility-administered medications prior to visit.    Review of Systems  Constitutional:  Negative for appetite change, chills and fever.  Respiratory:  Negative for chest tightness, shortness of breath and wheezing.   Cardiovascular:  Negative for chest pain and palpitations.  Gastrointestinal:  Negative for abdominal pain, nausea and vomiting.      Objective    BP 128/67 (BP Location: Left Arm, Patient Position: Sitting, Cuff Size: Normal)   Pulse 60   Temp 98.2 F (36.8 C) (Temporal)   Resp 16   Wt 218 lb (98.9 kg)   BMI 30.40 kg/m  {Show previous vital signs (optional):23777}  Physical Exam  General appearance: Overweight male, cooperative and in no acute distress Head: Normocephalic, without obvious abnormality, atraumatic Respiratory: Respirations even and unlabored, normal respiratory rate Extremities: All extremities are intact.  Skin: Skin color, texture, turgor normal. No rashes seen  Psych: Appropriate mood and affect. Neurologic: Mental status: Alert, oriented to person, place, and time, thought content appropriate.   Results for orders placed or performed in visit on 03/18/21  POCT HgB A1C  Result Value Ref Range   Hemoglobin A1C 5.9 (A) 4.0 - 5.6 %   Est. average glucose Bld gHb Est-mCnc 123     Assessment & Plan     1. Prediabetes A1c much better with improvements in diet and staying physically active.   2. Need for influenza vaccination  - Flu Vaccine QUAD High Dose IM (Fluad)  3. Primary hypertension Well controlled.  Continue current medications.     Follow up May 2022 for CPE     The entirety of the information documented in the History of Present Illness, Review of Systems and Physical Exam were personally obtained by me. Portions of this information were initially documented by the CMA and reviewed by me for  thoroughness and accuracy.     Lelon Huh, MD  [facility] [phone] (phone) [phone] (fax)  [place]

## 2021-06-03 DIAGNOSIS — H353221 Exudative age-related macular degeneration, left eye, with active choroidal neovascularization: Secondary | ICD-10-CM | POA: Diagnosis not present

## 2021-06-13 DIAGNOSIS — Z8521 Personal history of malignant neoplasm of larynx: Secondary | ICD-10-CM | POA: Diagnosis not present

## 2021-06-13 DIAGNOSIS — K219 Gastro-esophageal reflux disease without esophagitis: Secondary | ICD-10-CM | POA: Diagnosis not present

## 2021-06-13 DIAGNOSIS — R490 Dysphonia: Secondary | ICD-10-CM | POA: Diagnosis not present

## 2021-06-16 ENCOUNTER — Other Ambulatory Visit: Payer: Self-pay | Admitting: Family Medicine

## 2021-06-16 DIAGNOSIS — E876 Hypokalemia: Secondary | ICD-10-CM

## 2021-06-16 NOTE — Telephone Encounter (Signed)
Medication Refill - Medication: hydrochlorothiazide (HYDRODIURIL)  diltiazem (CARDIZEM CD) potassium chloride SA (KLOR-CON)  Has the patient contacted their pharmacy? Yes.    (Agent: If yes, when and what did the pharmacy advise?) Pt was told to contact pcp  Preferred Pharmacy (with phone number or street name):  [place], [place] - [address]  Phone:  [phone] Fax:  [phone]  Has the patient been seen for an appointment in the last year OR does the patient have an upcoming appointment? Yes.    Agent: Please be advised that RX refills may take up to 3 business days. We ask that you follow-up with your pharmacy.

## 2021-06-17 MED ORDER — HYDROCHLOROTHIAZIDE 25 MG PO TABS: 25.0000 mg | ORAL_TABLET | Freq: Every day | ORAL | 0 refills | Status: DC

## 2021-06-17 MED ORDER — POTASSIUM CHLORIDE CRYS ER 20 MEQ PO TBCR: 20.0000 meq | EXTENDED_RELEASE_TABLET | Freq: Every day | ORAL | 0 refills | Status: DC

## 2021-06-17 NOTE — Telephone Encounter (Signed)
Requested Prescriptions  Pending Prescriptions Disp Refills   potassium chloride SA (KLOR-CON M) 20 MEQ tablet 90 tablet 0    Sig: Take 1 tablet (20 mEq total) by mouth daily.     Endocrinology:  Minerals - Potassium Supplementation Failed - 06/17/2021  3:08 PM      Failed - Cr in normal range and within 360 days    Creatinine, Ser  Date Value Ref Range Status  11/05/2020 1.29 (H) 0.76 - 1.27 mg/dL Final         Passed - K in normal range and within 360 days    Potassium  Date Value Ref Range Status  11/05/2020 3.5 3.5 - 5.2 mmol/L Final         Passed - Valid encounter within last 12 months    Recent Outpatient Visits          3 months ago Prediabetes   [facility] Birdie Sons, MD   7 months ago Prostate cancer screening   [facility] Birdie Sons, MD   10 months ago Pneumonia due to COVID-19 virus   [place], Kirstie Peri, MD   1 year ago Hypersomnia   [facility] Birdie Sons, MD   1 year ago Essential hypertension   [facility] Birdie Sons, MD      Future Appointments            In 2 months Agbor-Etang, Aaron Edelman, MD [place], [facility]            hydrochlorothiazide (HYDRODIURIL) 25 MG tablet 90 tablet 0    Sig: Take 1 tablet (25 mg total) by mouth daily.     Cardiovascular: Diuretics - Thiazide Failed - 06/17/2021  3:08 PM      Failed - Cr in normal range and within 360 days    Creatinine, Ser  Date Value Ref Range Status  11/05/2020 1.29 (H) 0.76 - 1.27 mg/dL Final         Passed - Ca in normal range and within 360 days    Calcium  Date Value Ref Range Status  11/05/2020 9.2 8.6 - 10.2 mg/dL Final         Passed - K in normal range and within 360 days    Potassium  Date Value Ref Range Status  11/05/2020 3.5 3.5 - 5.2 mmol/L Final         Passed - Na in normal range and within 360 days    Sodium  Date Value Ref Range Status   11/05/2020 143 134 - 144 mmol/L Final         Passed - Last BP in normal range    BP Readings from Last 1 Encounters:  03/18/21 128/67         Passed - Valid encounter within last 6 months    Recent Outpatient Visits          3 months ago Prediabetes   [facility] Birdie Sons, MD   7 months ago Prostate cancer screening   [facility] Birdie Sons, MD   10 months ago Pneumonia due to COVID-19 virus   [place], Kirstie Peri, MD   1 year ago [place], Donald E, MD   1 year ago Essential hypertension   [facility] Birdie Sons, MD      Future Appointments  In 2 months Agbor-Etang, Aaron Edelman, MD [facility], [facility]            diltiazem (CARDIZEM CD) 120 MG 24 hr capsule 90 capsule     Sig: Take 1 capsule (120 mg total) by mouth daily.     Cardiovascular:  Calcium Channel Blockers Passed - 06/17/2021  3:08 PM      Passed - Last BP in normal range    BP Readings from Last 1 Encounters:  03/18/21 128/67         Passed - Valid encounter within last 6 months    Recent Outpatient Visits          3 months ago Prediabetes   [facility] Birdie Sons, MD   7 months ago Prostate cancer screening   [facility] Birdie Sons, MD   10 months ago Pneumonia due to COVID-19 virus   [facility] Birdie Sons, MD   1 year ago Hypersomnia   [facility] Birdie Sons, MD   1 year ago Essential hypertension   [facility] Birdie Sons, MD      Future Appointments            In 2 months Agbor-Etang, Aaron Edelman, MD [facility], [facility]

## 2021-06-25 ENCOUNTER — Telehealth: Payer: Self-pay | Admitting: Cardiology

## 2021-06-25 NOTE — Telephone Encounter (Signed)
°*  STAT* If patient is at the pharmacy, call can be transferred to refill team.   1. Which medications need to be refilled? (please list name of each medication and dose if known) diltiazem 120 MG 1 capsule daily   2. Which pharmacy/location (including street and city if local pharmacy) is medication to be sent to? [facility]   3. Do they need a 30 day or 90 day supply? 90 day

## 2021-06-26 MED ORDER — DILTIAZEM HCL ER COATED BEADS 120 MG PO CP24: 120.0000 mg | ORAL_CAPSULE | Freq: Every day | ORAL | 0 refills | Status: DC

## 2021-06-26 NOTE — Telephone Encounter (Signed)
Requested Prescriptions   Signed Prescriptions Disp Refills   diltiazem (CARDIZEM CD) 120 MG 24 hr capsule 90 capsule 0    Sig: Take 1 capsule (120 mg total) by mouth daily.    Authorizing Provider: AGBOR-ETANG, BRIAN    Ordering User: NEWCOMER MCCLAIN, Beckey Polkowski L    

## 2021-09-04 ENCOUNTER — Ambulatory Visit: Payer: Medicare Other | Admitting: Cardiology

## 2021-09-04 ENCOUNTER — Encounter: Payer: Self-pay | Admitting: Cardiology

## 2021-09-04 ENCOUNTER — Other Ambulatory Visit: Payer: Self-pay

## 2021-09-04 VITALS — BP 120/60 | HR 72 | Ht 70.0 in | Wt 209.0 lb

## 2021-09-04 DIAGNOSIS — R9431 Abnormal electrocardiogram [ECG] [EKG]: Secondary | ICD-10-CM | POA: Diagnosis not present

## 2021-09-04 DIAGNOSIS — I10 Essential (primary) hypertension: Secondary | ICD-10-CM | POA: Diagnosis not present

## 2021-09-04 DIAGNOSIS — G4733 Obstructive sleep apnea (adult) (pediatric): Secondary | ICD-10-CM | POA: Diagnosis not present

## 2021-09-04 DIAGNOSIS — I493 Ventricular premature depolarization: Secondary | ICD-10-CM | POA: Diagnosis not present

## 2021-09-04 NOTE — Patient Instructions (Signed)
Medication Instructions:  ?No changes at this time.  ? ?*If you need a refill on your cardiac medications before your next appointment, please call your pharmacy* ? ? ?Lab Work: ?None ? ?If you have labs (blood work) drawn today and your tests are completely normal, you will receive your results only by: ?MyChart Message (if you have MyChart) OR ?A paper copy in the mail ?If you have any lab test that is abnormal or we need to change your treatment, we will call you to review the results. ? ? ?Testing/Procedures: ?None ? ? ?Follow-Up: ?At [facility], you and your health needs are our priority.  As part of our continuing mission to provide you with exceptional heart care, we have created designated Provider Care Teams.  These Care Teams include your primary Cardiologist (physician) and Advanced Practice Providers (APPs -  Physician Assistants and Nurse Practitioners) who all work together to provide you with the care you need, when you need it. ? ? ?Your next appointment:   ?As needed. :1}  ? ?

## 2021-09-04 NOTE — Progress Notes (Signed)
Cardiology Office Note:    Date:  09/04/2021   ID:  Jeffrey Torres, DOB [birth year], MRN [id number]  PCP:  Birdie Sons, MD  [facility] Cardiologist:  Kate Sable, MD  [place] Electrophysiologist:  None   Referring MD: Birdie Sons, MD   Chief Complaint  Patient presents with   Other    6 month follow up -- Meds reviewed verbally with patient.     History of Present Illness:    Jeffrey Torres is a 76 y.o. male with a hx of GERD, hypertension, OSA, former smoker who presents for follow-up.    Previously seen for hypertension and medication management.  HCTZ was started, Cardizem continued.  He feels well, states trying to exercise by walking.  Denies chest pain or shortness of breath.  Takes medications as prescribed.  Blood pressure has been well controlled.   Prior notes.   diagnosed with OSA, CPAP mask recommended but patient does not like to wear a mask.  He will try to lose weight to see if this will help with his sleep apnea.     Past Medical History:  Diagnosis Date   Acute metabolic encephalopathy 01/04/2955   Arthritis    Cancer ([place])    larynx ca, surgery and radiation   GERD (gastroesophageal reflux disease)    Heart murmur    Hernia, inguinal, bilateral    History of chicken pox    History of kidney stones    History of measles    History of mumps    History of shingles    HOH (hard of hearing)    Macular degeneration of both eyes    Motion sickness    ocean boat   Pneumonia due to COVID-19 virus 07/31/2020   PVC (premature ventricular contraction)     Past Surgical History:  Procedure Laterality Date   CARDIAC CATHETERIZATION  2010   normal per patient report   CATARACT EXTRACTION W/PHACO Right 07/18/2019   Procedure: CATARACT EXTRACTION PHACO AND INTRAOCULAR LENS PLACEMENT ([place]) RIGHT 4.54 00:29.7;  Surgeon: Birder Robson, MD;  Location: [facility];  Service: Ophthalmology;  Laterality: Right;   CATARACT EXTRACTION  W/PHACO Left 08/08/2019   Procedure: CATARACT EXTRACTION PHACO AND INTRAOCULAR LENS PLACEMENT ([place]) LEFT;  Surgeon: Birder Robson, MD;  Location: [facility] OR[facility];  Service: Ophthalmology;  Laterality: Left;  Lot #[id number] H [place]: 00:34.5 CDE: 4.75   COLONOSCOPY  06/24/2006   Dr. Bary Castilla. Multiple benign appearing 55mm polyps in the cecum and in the rectum. -Three 58mm polyps in the transversed colon, in the tranverse colon, proximal and in the distal transverse colon. Resected and retrieved.   COLONOSCOPY WITH PROPOFOL N/A 03/12/2020   Procedure: COLONOSCOPY WITH PROPOFOL;  Surgeon: Lucilla Lame, MD;  Location: [facility] ENDOSCOPY;  Service: Endoscopy;  Laterality: N/A;   CYSTOSCOPY WITH HOLMIUM LASER LITHOTRIPSY  2001   kidney stones removed, [facility]   LARYNGOSCOPY Right 04/01/2017   Procedure: SUSPENSION LARYNGOSCOPY WITH MICROFLAP EXCISION;  Surgeon: Carloyn Manner, MD;  Location: [facility] ORS;  Service: ENT;  Laterality: Right;   sinus surgery   2002   [place]; removal of polyps   TONSILLECTOMY  1954   Tubular adenoma removed  06/24/2006    Current Medications: Current Meds  Medication Sig   albuterol (VENTOLIN HFA) 108 (90 Base) MCG/ACT inhaler Inhale 2 puffs into the lungs every 6 (six) hours as needed for wheezing or shortness of breath.   aspirin 81 MG tablet Take 81 mg by mouth daily.  diltiazem (CARDIZEM CD) 120 MG 24 hr capsule Take 1 capsule (120 mg total) by mouth daily.   feeding supplement (ENSURE ENLIVE / ENSURE PLUS) LIQD Take 237 mLs by mouth 3 (three) times daily between meals.   fluticasone (FLONASE) 50 MCG/ACT nasal spray Place 2 sprays into both nostrils every morning.    hydrochlorothiazide (HYDRODIURIL) 25 MG tablet Take 1 tablet (25 mg total) by mouth daily.   Multiple Vitamins-Minerals (PRESERVISION AREDS 2 PO) Take 2 capsules by mouth every morning.    omeprazole (PRILOSEC) 20 MG capsule Take 20 mg by mouth daily before breakfast.   potassium chloride SA (KLOR-CON M) 20 MEQ  tablet Take 1 tablet (20 mEq total) by mouth daily.   tamsulosin (FLOMAX) 0.4 MG CAPS capsule Take 1 capsule (0.4 mg total) by mouth daily after supper.     Allergies:   Penicillins   Social History   Socioeconomic History   Marital status: Legally Separated    Spouse name: Not on file   Number of children: 2   Years of education: Not on file   Highest education level: 12th grade  Occupational History   Occupation: Truck driver    Comment: unemployed  Tobacco Use   Smoking status: Some Days    Packs/day: 1.00    Types: Cigarettes    Last attempt to quit: 12/2017    Years since quitting: 3.6   Smokeless tobacco: Never   Tobacco comments:    started age 19 yo.previously smoked carton a week  Vaping Use   Vaping Use: Never used  Substance and Sexual Activity   Alcohol use: Not Currently    Alcohol/week: 0.0 standard drinks   Drug use: No   Sexual activity: Not on file  Other Topics Concern   Not on file  Social History Narrative   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   Social Determinants of Health   Financial Resource Strain: Not on file  Food Insecurity: Not on file  Transportation Needs: Not on file  Physical Activity: Not on file  Stress: Not on file  Social Connections: Not on file     Family History: The patient's family history includes Heart disease in his father; Lung cancer in his mother.  ROS:   Please see the history of present illness.     All other systems reviewed and are negative.  EKGs/Labs/Other Studies Reviewed:    The following studies were reviewed today:   EKG:  EKG is  ordered today.  The ekg ordered today demonstrates normal sinus rhythm, normal ECG.  Recent Labs: 11/05/2020: BUN 13; Creatinine, Ser 1.29; Potassium 3.5; Sodium 143  Recent Lipid Panel    Component Value Date/Time   CHOL 147 08/14/2019 1006   TRIG 249 (H) 08/14/2019 1006   HDL 43 08/14/2019 1006   CHOLHDL 3.4 08/14/2019 1006   LDLCALC 64 08/14/2019 1006     Physical Exam:    VS:  BP 120/60 (BP Location: Left Arm, Patient Position: Sitting, Cuff Size: Normal)    Pulse 72    Ht 5\' 10"  (1.778 m)    Wt 209 lb (94.8 kg)    SpO2 97%    BMI 29.99 kg/m     Wt Readings from Last 3 Encounters:  09/04/21 209 lb (94.8 kg)  03/18/21 218 lb (98.9 kg)  03/06/21 214 lb (97.1 kg)     GEN:  Well nourished, well developed in no acute distress HEENT: Normal NECK: No JVD; No carotid bruits LYMPHATICS: No lymphadenopathy CARDIAC:  RRR, no murmurs, rubs, gallops RESPIRATORY:  Clear to auscultation without rales, wheezing or rhonchi  ABDOMEN: Soft, non-tender, non-distended MUSCULOSKELETAL:  No edema; No deformity  SKIN: Warm and dry NEUROLOGIC:  Alert and oriented x 3 PSYCHIATRIC:  Normal affect   ASSESSMENT:    1. Primary hypertension   2. OSA (obstructive sleep apnea)   3. Premature ventricular contractions   4. Nonspecific abnormal electrocardiogram (ECG) (EKG)    PLAN:    In order of problems listed above:  Hypertension, blood pressure controlled.  Continue HCTZ, Cardizem. OSA, not compliant with CPAP mask, states he will try exercising and losing weight.  Has lost 9 pounds since last clinic visit.  Encouraged to continue heart healthy, low calorie diet with exercise.  Follow-up as needed  Total encounter time 35 minutes  Greater than 50% was spent in counseling and coordination of care with the patient    Medication Adjustments/Labs and Tests Ordered: Current medicines are reviewed at length with the patient today.  Concerns regarding medicines are outlined above.  Orders Placed This Encounter  Procedures   EKG 12-Lead    No orders of the defined types were placed in this encounter.    Patient Instructions  Medication Instructions:  No changes at this time.   *If you need a refill on your cardiac medications before your next appointment, please call your pharmacy*   Lab Work: None  If you have labs (blood work) drawn  today and your tests are completely normal, you will receive your results only by: [place] (if you have MyChart) OR A paper copy in the mail If you have any lab test that is abnormal or we need to change your treatment, we will call you to review the results.   Testing/Procedures: None   Follow-Up: At [facility], you and your health needs are our priority.  As part of our continuing mission to provide you with exceptional heart care, we have created designated Provider Care Teams.  These Care Teams include your primary Cardiologist (physician) and Advanced Practice Providers (APPs -  Physician Assistants and Nurse Practitioners) who all work together to provide you with the care you need, when you need it.   Your next appointment:   As needed. :1}     Signed, Kate Sable, MD  09/04/2021 12:12 PM    [place]

## 2021-09-08 DIAGNOSIS — H353221 Exudative age-related macular degeneration, left eye, with active choroidal neovascularization: Secondary | ICD-10-CM | POA: Diagnosis not present

## 2021-09-29 ENCOUNTER — Telehealth: Payer: Self-pay | Admitting: Cardiology

## 2021-09-29 ENCOUNTER — Other Ambulatory Visit: Payer: Self-pay | Admitting: Family Medicine

## 2021-09-29 DIAGNOSIS — E876 Hypokalemia: Secondary | ICD-10-CM

## 2021-09-29 MED ORDER — HYDROCHLOROTHIAZIDE 25 MG PO TABS: 25.0000 mg | ORAL_TABLET | Freq: Every day | ORAL | 3 refills | Status: DC

## 2021-09-29 NOTE — Telephone Encounter (Signed)
Requested Prescriptions  ? ?Signed Prescriptions Disp Refills  ? hydrochlorothiazide (HYDRODIURIL) 25 MG tablet 90 tablet 3  ?  Sig: Take 1 tablet (25 mg total) by mouth daily.  ?  Authorizing Provider: Kate Sable  ?  Ordering User: Othelia Pulling C  ? ? ?

## 2021-09-29 NOTE — Telephone Encounter (Signed)
Okay to refill per Dr. Thereasa Solo OV note on 09/04/21. ? ?See telephone encounter from PCP this morning. Patient is requesting refills be sent to: ? ?[id number] [place], [place] - [address] ?

## 2021-09-29 NOTE — Telephone Encounter (Signed)
Please advise if ok to refill HCTZ 25 MG QD. ?Medication last filled by PCP. ?

## 2021-09-29 NOTE — Telephone Encounter (Signed)
Medication Refill - Medication: hydrochlorothiazide (HYDRODIURIL) 25 MG tablet ?potassium chloride SA (KLOR-CON M) 20 MEQ tablet ?Has the patient contacted their pharmacy? No ?[facility] requirements ?Preferred Pharmacy (with phone number or street name): [id number] [place], [place] - [address] ?Has the patient been seen for an appointment in the last year OR does the patient have an upcoming appointment? Yes.   ? ?Agent: Please be advised that RX refills may take up to 3 business days. We ask that you follow-up with your pharmacy.  ?

## 2021-09-29 NOTE — Telephone Encounter (Signed)
?*  STAT* If patient is at the pharmacy, call can be transferred to refill team. ? ? ?1. Which medications need to be refilled? (please list name of each medication and dose if known) ? hydrochlorothiazide (HYDRODIURIL) 25 MG tablet ? ?2. Which pharmacy/location (including street and city if local pharmacy) is medication to be sent to? [id number] [place], [place] - [address] ? ?3. Do they need a 30 day or 90 day supply? 90 day supply ? ? ?Only has a few tablets left.   ?

## 2021-09-30 MED ORDER — POTASSIUM CHLORIDE CRYS ER 20 MEQ PO TBCR: 20.0000 meq | EXTENDED_RELEASE_TABLET | Freq: Every day | ORAL | 0 refills | Status: DC

## 2021-09-30 NOTE — Telephone Encounter (Signed)
Requested Prescriptions  ?Pending Prescriptions Disp Refills  ?? potassium chloride SA (KLOR-CON M) 20 MEQ tablet 90 tablet 0  ?  Sig: Take 1 tablet (20 mEq total) by mouth daily.  ?  ? Endocrinology:  Minerals - Potassium Supplementation Failed - 09/30/2021  5:54 PM  ?  ?  Failed - Cr in normal range and within 360 days  ?  Creatinine, Ser  ?Date Value Ref Range Status  ?11/05/2020 1.29 (H) 0.76 - 1.27 mg/dL Final  ?   ?  ?  Passed - K in normal range and within 360 days  ?  Potassium  ?Date Value Ref Range Status  ?11/05/2020 3.5 3.5 - 5.2 mmol/L Final  ?   ?  ?  Passed - Valid encounter within last 12 months  ?  Recent Outpatient Visits   ?      ? 6 months ago Prediabetes  ? [facility] Birdie Sons, MD  ? 10 months ago Prostate cancer screening  ? [facility] Birdie Sons, MD  ? 1 year ago Pneumonia due to COVID-19 virus  ? [facility] Caryn Section, Kirstie Peri, MD  ? 1 year ago Hypersomnia  ? [facility] Caryn Section, Kirstie Peri, MD  ? 2 years ago Essential hypertension  ? [facility] Caryn Section, Kirstie Peri, MD  ?  ?  ? ?  ?  ?  ? ?

## 2021-09-30 NOTE — Telephone Encounter (Signed)
Patient called and advised HCTZ was sent on yesterday. He says he needs Diltiazem, advised to call the cardiologist office tomorrow for that refill, since that's who has been prescribing it. He says he will need potassium, advised I will send in the refill request. ?

## 2021-09-30 NOTE — Addendum Note (Signed)
Addended by: Matilde Sprang on: 09/30/2021 05:54 PM ? ? Modules accepted: Orders ? ?

## 2021-09-30 NOTE — Telephone Encounter (Signed)
Pt advised of Hydrochlorothiazide refill sent yesterday but he asked for status of request for potassium chloride SA (KLOR-CON M) 20 MEQ tablet and he stated he also requested his BP medication but doesn't know the name of it / please advise /pt has changed pharmacies and now [id number] [place], [place] - [address]  ?[address], [place] [address]  ?Phone:  [phone]  Fax:  [phone]  ?

## 2021-09-30 NOTE — Telephone Encounter (Signed)
Already refilled by provider on 09/29/21. ?

## 2021-10-01 ENCOUNTER — Telehealth: Payer: Self-pay | Admitting: Cardiology

## 2021-10-01 ENCOUNTER — Other Ambulatory Visit: Payer: Self-pay

## 2021-10-01 MED ORDER — DILTIAZEM HCL ER COATED BEADS 120 MG PO CP24
120.0000 mg | ORAL_CAPSULE | Freq: Every day | ORAL | 3 refills | Status: DC
Start: 2021-10-01 — End: 2021-10-08

## 2021-10-01 NOTE — Telephone Encounter (Signed)
?*  STAT* If patient is at the pharmacy, call can be transferred to refill team. ? ? ?1. Which medications need to be refilled? (please list name of each medication and dose if known)  ?diltiazem (CARDIZEM CD) 120 MG 24 hr capsule ? ?2. Which pharmacy/location (including street and city if local pharmacy) is medication to be sent to? ?[id number] [place], [place] - [address]  ?Phone:  [phone] ?Fax:  [phone] ? ?3. Do they need a 30 day or 90 day supply? 90 ds ? ?

## 2021-10-01 NOTE — Telephone Encounter (Signed)
diltiazem (CARDIZEM CD) 120 MG 24 hr capsule 90 capsule 3 10/01/2021    ?Sig - Route: Take 1 capsule (120 mg total) by mouth daily. - Oral   ?Sent to pharmacy as: diltiazem (CARDIZEM CD) 120 MG 24 hr capsule   ?E-Prescribing Status: Sent to pharmacy (10/01/2021 11:46 AM EDT)   ? ?Pharmacy ? ?[id number] [place], [place] - [address]  ? ?

## 2021-10-08 ENCOUNTER — Telehealth: Payer: Self-pay | Admitting: Cardiology

## 2021-10-08 MED ORDER — DILTIAZEM HCL ER COATED BEADS 120 MG PO CP24
120.0000 mg | ORAL_CAPSULE | Freq: Every day | ORAL | 3 refills | Status: DC
Start: 2021-10-08 — End: 2022-09-28

## 2021-10-08 NOTE — Telephone Encounter (Signed)
?*  STAT* If patient is at the pharmacy, call can be transferred to refill team. ? ? ?1. Which medications need to be refilled? (please list name of each medication and dose if known) diltiazem (CARDIZEM CD) 120 MG 24 hr capsule ? ?2. Which pharmacy/location (including street and city if local pharmacy) is medication to be sent to? [id number] [place], [place] - [address] ? ?3. Do they need a 30 day or 90 day supply? 90 ? ?

## 2021-10-08 NOTE — Telephone Encounter (Signed)
Requested Prescriptions  ? ?Signed Prescriptions Disp Refills  ? diltiazem (CARDIZEM CD) 120 MG 24 hr capsule 90 capsule 3  ?  Sig: Take 1 capsule (120 mg total) by mouth daily.  ?  Authorizing Provider: Kate Sable  ?  Ordering User: Othelia Pulling C  ? ? ?

## 2021-10-27 DIAGNOSIS — H35341 Macular cyst, hole, or pseudohole, right eye: Secondary | ICD-10-CM | POA: Diagnosis not present

## 2021-10-31 DIAGNOSIS — Z8521 Personal history of malignant neoplasm of larynx: Secondary | ICD-10-CM | POA: Diagnosis not present

## 2021-10-31 DIAGNOSIS — I2510 Atherosclerotic heart disease of native coronary artery without angina pectoris: Secondary | ICD-10-CM | POA: Diagnosis not present

## 2021-10-31 DIAGNOSIS — Z809 Family history of malignant neoplasm, unspecified: Secondary | ICD-10-CM | POA: Diagnosis not present

## 2021-10-31 DIAGNOSIS — Z720 Tobacco use: Secondary | ICD-10-CM | POA: Diagnosis not present

## 2021-10-31 DIAGNOSIS — I4891 Unspecified atrial fibrillation: Secondary | ICD-10-CM | POA: Diagnosis not present

## 2021-10-31 DIAGNOSIS — Z791 Long term (current) use of non-steroidal anti-inflammatories (NSAID): Secondary | ICD-10-CM | POA: Diagnosis not present

## 2021-10-31 DIAGNOSIS — N529 Male erectile dysfunction, unspecified: Secondary | ICD-10-CM | POA: Diagnosis not present

## 2021-10-31 DIAGNOSIS — M1990 Unspecified osteoarthritis, unspecified site: Secondary | ICD-10-CM | POA: Diagnosis not present

## 2021-10-31 DIAGNOSIS — Z9181 History of falling: Secondary | ICD-10-CM | POA: Diagnosis not present

## 2021-10-31 DIAGNOSIS — Z8249 Family history of ischemic heart disease and other diseases of the circulatory system: Secondary | ICD-10-CM | POA: Diagnosis not present

## 2021-10-31 DIAGNOSIS — I10 Essential (primary) hypertension: Secondary | ICD-10-CM | POA: Diagnosis not present

## 2021-11-18 ENCOUNTER — Encounter: Payer: Self-pay | Admitting: Family Medicine

## 2021-11-18 ENCOUNTER — Ambulatory Visit (INDEPENDENT_AMBULATORY_CARE_PROVIDER_SITE_OTHER): Payer: Medicare HMO | Admitting: Family Medicine

## 2021-11-18 VITALS — BP 124/64 | HR 114 | Temp 98.6°F | Resp 16 | Ht 71.0 in | Wt 194.5 lb

## 2021-11-18 DIAGNOSIS — F5102 Adjustment insomnia: Secondary | ICD-10-CM

## 2021-11-18 DIAGNOSIS — R7303 Prediabetes: Secondary | ICD-10-CM | POA: Diagnosis not present

## 2021-11-18 DIAGNOSIS — I10 Essential (primary) hypertension: Secondary | ICD-10-CM

## 2021-11-18 DIAGNOSIS — Z0000 Encounter for general adult medical examination without abnormal findings: Secondary | ICD-10-CM | POA: Diagnosis not present

## 2021-11-18 DIAGNOSIS — Z125 Encounter for screening for malignant neoplasm of prostate: Secondary | ICD-10-CM | POA: Diagnosis not present

## 2021-11-18 DIAGNOSIS — N529 Male erectile dysfunction, unspecified: Secondary | ICD-10-CM

## 2021-11-18 DIAGNOSIS — F17200 Nicotine dependence, unspecified, uncomplicated: Secondary | ICD-10-CM

## 2021-11-18 DIAGNOSIS — R69 Illness, unspecified: Secondary | ICD-10-CM | POA: Diagnosis not present

## 2021-11-18 DIAGNOSIS — F32A Depression, unspecified: Secondary | ICD-10-CM

## 2021-11-18 MED ORDER — SILDENAFIL CITRATE 50 MG PO TABS: 50.0000 mg | ORAL_TABLET | Freq: Every day | ORAL | 2 refills | Status: DC | PRN

## 2021-11-18 MED ORDER — SERTRALINE HCL 50 MG PO TABS: ORAL_TABLET | ORAL | 1 refills | Status: DC

## 2021-11-18 NOTE — Progress Notes (Signed)
Annual Wellness Visit     Patient: Jeffrey Torres, Male    DOB: 19-Oct-1945, 76 y.o.   MRN: [id number] Visit Date: 11/18/2021  Today's Provider: Lelon Huh, MD    Subjective    Jeffrey Torres is a 76 y.o. male who presents today for his Annual Wellness Visit.    Medications: Outpatient Medications Prior to Visit  Medication Sig   aspirin 81 MG tablet Take 81 mg by mouth daily.   diltiazem (CARDIZEM CD) 120 MG 24 hr capsule Take 1 capsule (120 mg total) by mouth daily.   fluticasone (FLONASE) 50 MCG/ACT nasal spray Place 2 sprays into both nostrils every morning.    hydrochlorothiazide (HYDRODIURIL) 25 MG tablet Take 1 tablet (25 mg total) by mouth daily.   Multiple Vitamins-Minerals (PRESERVISION AREDS 2 PO) Take 2 capsules by mouth every morning.    omeprazole (PRILOSEC) 20 MG capsule Take 20 mg by mouth daily before breakfast.   potassium chloride SA (KLOR-CON M) 20 MEQ tablet Take 1 tablet (20 mEq total) by mouth daily.   tamsulosin (FLOMAX) 0.4 MG CAPS capsule Take 1 capsule (0.4 mg total) by mouth daily after supper.   albuterol (VENTOLIN HFA) 108 (90 Base) MCG/ACT inhaler Inhale 2 puffs into the lungs every 6 (six) hours as needed for wheezing or shortness of breath. (Patient not taking: Reported on 11/18/2021)   feeding supplement (ENSURE ENLIVE / ENSURE PLUS) LIQD Take 237 mLs by mouth 3 (three) times daily between meals.   No facility-administered medications prior to visit.    Allergies  Allergen Reactions   Penicillins Anaphylaxis and Swelling    Did it involve swelling of the face/tongue/throat, SOB, or low BP? Yes Did it involve sudden or severe rash/hives, skin peeling, or any reaction on the inside of your mouth or nose? No Did you need to seek medical attention at a hospital or doctor's office? No When did it last happen?      10 + years ago If all above answers are "NO", may proceed with cephalosporin use.     Patient Care Team: Birdie Sons, MD  as PCP - General (Family Medicine) Kate Sable, MD as PCP - Cardiology (Cardiology) Birder Robson, MD as Referring Physician (Ophthalmology) Carloyn Manner, MD as Referring Physician (Otolaryngology) Noreene Filbert, MD as Referring Physician (Radiation Oncology) Isaias Sakai, MD as Referring Physician (Ophthalmology)    Objective     Most recent functional status assessment:    11/18/2021    9:13 AM  In your present state of health, do you have any difficulty performing the following activities:  Hearing? 1  Vision? 1  Difficulty concentrating or making decisions? 0  Walking or climbing stairs? 0  Dressing or bathing? 0  Doing errands, shopping? 0   Most recent fall risk assessment:    11/18/2021    9:12 AM  Fall Risk   Falls in the past year? 0  Number falls in past yr: 0  Injury with Fall? 0  Follow up Falls evaluation completed    Most recent depression screenings:    11/18/2021    9:13 AM 11/18/2021    9:12 AM  PHQ 2/9 Scores  PHQ - 2 Score 5 5  PHQ- 9 Score 17 17   Most recent cognitive screening:    07/20/2019    2:57 PM  6CIT Screen  What Year? 0 points  What month? 0 points  What time? 0 points  Count back from 20 0 points  Months in reverse 0 points  Repeat phrase 4 points  Total Score 4 points   Most recent Audit-C alcohol use screening    11/18/2021    9:14 AM  Alcohol Use Disorder Test (AUDIT)  1. How often do you have a drink containing alcohol? 2  2. How many drinks containing alcohol do you have on a typical day when you are drinking? 1  3. How often do you have six or more drinks on one occasion? 0  AUDIT-C Score 3   A score of 3 or more in women, and 4 or more in men indicates increased risk for alcohol abuse, EXCEPT if all of the points are from question 1   No results found for any visits on 11/18/21.  Assessment & Plan     Annual wellness visit done today including the all of the following: Reviewed patient's Family  Medical History Reviewed and updated list of patient's medical providers Assessment of cognitive impairment was done Assessed patient's functional ability Established a written schedule for health screening [place] Completed and Reviewed  Exercise Activities and Dietary recommendations  Goals      DIET - REDUCE CALORIE INTAKE     Recommend continuing current diet plan of avoiding fried foods and junk food to help aid in weight loss.          Immunization History  Administered Date(s) Administered   Fluad Quad(high Dose 65+) 08/07/2020, 03/18/2021   Influenza, High Dose Seasonal PF 03/25/2017, 03/29/2018, 04/24/2019   Pneumococcal Conjugate-13 04/26/2014   Pneumococcal Polysaccharide-23 05/28/2012    Health Maintenance  Topic Date Due   COVID-19 Vaccine (1) Never done   TETANUS/TDAP  Never done   Zoster Vaccines- Shingrix (1 of 2) Never done   INFLUENZA VACCINE  01/27/2022   COLONOSCOPY (Pts 45-15yrs Insurance coverage will need to be confirmed)  03/13/2023   Pneumonia Vaccine 39+ Years old  Completed   Hepatitis C Screening  Completed   HPV VACCINES  Aged Out     Discussed health benefits of physical activity, and encouraged him to engage in regular exercise appropriate for his age and condition.        The entirety of the information documented in the History of Present Illness, Review of Systems and Physical Exam were personally obtained by me. Portions of this information were initially documented by the CMA and reviewed by me for thoroughness and accuracy.     Lelon Huh, MD  [facility] [phone] (phone) [phone] (fax)  [place]

## 2021-11-18 NOTE — Progress Notes (Signed)
I,Jana Robinson,acting as a scribe for Lelon Huh, MD.,have documented all relevant documentation on the behalf of Lelon Huh, MD,as directed by  Lelon Huh, MD while in the presence of Lelon Huh, MD.   Complete Physical Exam      Patient: Jeffrey Torres, Male    DOB: 1946/02/03, 76 y.o.   MRN: [id number] Visit Date: 11/18/2021  Today's Provider: Lelon Huh, MD   Chief Complaint  Patient presents with   Annual Exam   Subjective    Jeffrey Torres is a 76 y.o. male who presents today for his Annual Wellness Visit. He reports consuming a general diet. Home exercise routine includes walking 30 mi -1 hrs per day. He generally feels well. He reports sleeping well. He does not have additional problems to discuss today.   He is also due for follow up hypertension. Does not check BP at home but is usually in the 120s and 130s at prior office visits. Had follow up with cardiology in March with BP of 120/60.   He has also had issues with depression, anxiety and mood related to being out of work for the last two years.    Medications: Outpatient Medications Prior to Visit  Medication Sig   aspirin 81 MG tablet Take 81 mg by mouth daily.   diltiazem (CARDIZEM CD) 120 MG 24 hr capsule Take 1 capsule (120 mg total) by mouth daily.   fluticasone (FLONASE) 50 MCG/ACT nasal spray Place 2 sprays into both nostrils every morning.    hydrochlorothiazide (HYDRODIURIL) 25 MG tablet Take 1 tablet (25 mg total) by mouth daily.   Multiple Vitamins-Minerals (PRESERVISION AREDS 2 PO) Take 2 capsules by mouth every morning.    omeprazole (PRILOSEC) 20 MG capsule Take 20 mg by mouth daily before breakfast.   potassium chloride SA (KLOR-CON M) 20 MEQ tablet Take 1 tablet (20 mEq total) by mouth daily.   tamsulosin (FLOMAX) 0.4 MG CAPS capsule Take 1 capsule (0.4 mg total) by mouth daily after supper.   albuterol (VENTOLIN HFA) 108 (90 Base) MCG/ACT inhaler Inhale 2 puffs into the lungs  every 6 (six) hours as needed for wheezing or shortness of breath. (Patient not taking: Reported on 11/18/2021)   feeding supplement (ENSURE ENLIVE / ENSURE PLUS) LIQD Take 237 mLs by mouth 3 (three) times daily between meals.   No facility-administered medications prior to visit.    Allergies  Allergen Reactions   Penicillins Anaphylaxis and Swelling    Did it involve swelling of the face/tongue/throat, SOB, or low BP? Yes Did it involve sudden or severe rash/hives, skin peeling, or any reaction on the inside of your mouth or nose? No Did you need to seek medical attention at a hospital or doctor's office? No When did it last happen?      10 + years ago If all above answers are "NO", may proceed with cephalosporin use.     Patient Care Team: Birdie Sons, MD as PCP - General (Family Medicine) Kate Sable, MD as PCP - Cardiology (Cardiology) Birder Robson, MD as Referring Physician (Ophthalmology) Carloyn Manner, MD as Referring Physician (Otolaryngology) Noreene Filbert, MD as Referring Physician (Radiation Oncology) Isaias Sakai, MD as Referring Physician (Ophthalmology)  Review of Systems  Constitutional:  Positive for fatigue.  HENT:  Positive for hearing loss, rhinorrhea, sinus pressure and tinnitus.   Eyes:  Positive for photophobia.  Cardiovascular:  Positive for palpitations.  Musculoskeletal:  Positive for neck pain.  Psychiatric/Behavioral:  Positive for sleep  disturbance.   All other systems reviewed and are negative.      Objective    Vitals: BP 124/64   Pulse (!) 114   Temp 98.6 F (37 C) (Jeffrey)   Resp 16   Ht 5\' 11"  (1.803 m)   Wt 194 lb 8 oz (88.2 kg)   SpO2 100%   BMI 27.13 kg/m     Physical Exam   General Appearance:     Well developed, well nourished male. Alert, cooperative, in no acute distress, appears stated age  Head:    Normocephalic, without obvious abnormality, atraumatic  Eyes:    PERRL, conjunctiva/corneas clear,  EOM's intact, fundi    benign, both eyes       Ears:    Normal TM's and external ear canals, both ears  Nose:   Nares normal, septum midline, mucosa normal, no drainage   or sinus tenderness  Throat:   Lips, mucosa, and tongue normal; teeth and gums normal  Neck:   Supple, symmetrical, trachea midline, no adenopathy;       thyroid:  No enlargement/tenderness/nodules; no carotid   bruit or JVD  Back:     Symmetric, no curvature, ROM normal, no CVA tenderness  Lungs:     Clear to auscultation bilaterally, respirations unlabored  Chest wall:    No tenderness or deformity  Heart:   Regularly rate and rhythm.  No murmurs, rubs, or gallops.  S1 and S2 normal  Abdomen:     Soft, non-tender, bowel sounds active all four quadrants,    no masses, no organomegaly  Genitalia:    deferred  Rectal:    deferred  Extremities:   All extremities are intact. No cyanosis or edema  Pulses:   2+ and symmetric all extremities  Skin:   Skin color, texture, turgor normal, no rashes or lesions  Lymph nodes:   Cervical, supraclavicular, and axillary nodes normal  Neurologic:   CNII-XII intact. Normal strength, sensation and reflexes      throughout       11/18/2021    9:13 AM 11/18/2021    9:12 AM 11/05/2020    9:45 AM  Depression screen PHQ 2/9  Decreased Interest 2 2 1   Down, Depressed, Hopeless 3 3 1   PHQ - 2 Score 5 5 2   Altered sleeping 2 2 2   Tired, decreased energy 2 2 1   Change in appetite 1 1 1   Feeling bad or failure about yourself  2 2 2   Trouble concentrating 3 3 0  Moving slowly or fidgety/restless 1 1 0  Suicidal thoughts 1 1 0  PHQ-9 Score 17 17 8   Difficult doing work/chores Somewhat difficult Somewhat difficult Somewhat difficult      Assessment & Plan     1. Annual physical exam   2. Prediabetes  - Hemoglobin A1c  3. Prostate cancer screening  - PSA Total (Reflex To Free) ([facility] only)  4. Primary hypertension Well controlled.  Continue current medications.  Continue  routine cardiology follow up .  - CBC - Comprehensive metabolic panel - Lipid panel - TSH  5. Tobacco dependence - Ambulatory Referral Lung Cancer Screening [facility] >20 pack year history   6. Erectile dysfunction, unspecified erectile dysfunction type  - sildenafil (VIAGRA) 50 MG tablet; Take 1 tablet (50 mg total) by mouth daily as needed for erectile dysfunction.  Dispense: 30 tablet; Refill: 2  7. Depression, unspecified depression type - sertraline (ZOLOFT) 50 MG tablet; Take 1/2 tablet every morning for  8 days, then increase to 1 tablet every morning  Dispense: 30 tablet; Refill: 1   8. Adjustment insomnia      The entirety of the information documented in the History of Present Illness, Review of Systems and Physical Exam were personally obtained by me. Portions of this information were initially documented by the CMA and reviewed by me for thoroughness and accuracy.     Lelon Huh, MD  [facility] [phone] (phone) [phone] (fax)  [place]

## 2021-11-18 NOTE — Patient Instructions (Addendum)
Please review the attached list of medications and notify my office if there are any errors.   Please bring all of your medications to every appointment so we can make sure that our medication list is the same as yours.   Please go to the lab draw station in [address] on the second floor of [facility]  when you are fasting for 8 hours. Normal hours are 8:00am to 11:30am and 1:00pm to 4:00pm Monday through Friday   You can install the GoodRx app on your smart phone to find the lowest prices for generic medications.

## 2021-11-21 DIAGNOSIS — I10 Essential (primary) hypertension: Secondary | ICD-10-CM | POA: Diagnosis not present

## 2021-11-21 DIAGNOSIS — Z125 Encounter for screening for malignant neoplasm of prostate: Secondary | ICD-10-CM | POA: Diagnosis not present

## 2021-11-21 DIAGNOSIS — R7303 Prediabetes: Secondary | ICD-10-CM | POA: Diagnosis not present

## 2021-11-22 LAB — COMPREHENSIVE METABOLIC PANEL
ALT: 17 IU/L (ref 0–44)
AST: 19 IU/L (ref 0–40)
Albumin/Globulin Ratio: 1.6 (ref 1.2–2.2)
Albumin: 4.4 g/dL (ref 3.7–4.7)
Alkaline Phosphatase: 65 IU/L (ref 44–121)
BUN/Creatinine Ratio: 14 (ref 10–24)
BUN: 17 mg/dL (ref 8–27)
Bilirubin Total: 0.2 mg/dL (ref 0.0–1.2)
CO2: 25 mmol/L (ref 20–29)
Calcium: 9.6 mg/dL (ref 8.6–10.2)
Chloride: 98 mmol/L (ref 96–106)
Creatinine, Ser: 1.18 mg/dL (ref 0.76–1.27)
Globulin, Total: 2.7 g/dL (ref 1.5–4.5)
Glucose: 118 mg/dL — ABNORMAL HIGH (ref 70–99)
Potassium: 3.7 mmol/L (ref 3.5–5.2)
Sodium: 139 mmol/L (ref 134–144)
Total Protein: 7.1 g/dL (ref 6.0–8.5)
eGFR: 65 mL/min/{1.73_m2} (ref >59)

## 2021-11-22 LAB — HEMOGLOBIN A1C
Est. average glucose Bld gHb Est-mCnc: 123 mg/dL
Hgb A1c MFr Bld: 5.9 % — ABNORMAL HIGH (ref 4.8–5.6)

## 2021-11-22 LAB — LIPID PANEL
Chol/HDL Ratio: 3.1 ratio (ref 0.0–5.0)
Cholesterol, Total: 154 mg/dL (ref 100–199)
HDL: 49 mg/dL (ref >39)
LDL Chol Calc (NIH): 90 mg/dL (ref 0–99)
Triglycerides: 76 mg/dL (ref 0–149)
VLDL Cholesterol Cal: 15 mg/dL (ref 5–40)

## 2021-11-22 LAB — CBC
Hematocrit: 49.2 % (ref 37.5–51.0)
Hemoglobin: 16.7 g/dL (ref 13.0–17.7)
MCH: 31.4 pg (ref 26.6–33.0)
MCHC: 33.9 g/dL (ref 31.5–35.7)
MCV: 93 fL (ref 79–97)
Platelets: 294 10*3/uL (ref 150–450)
RBC: 5.32 x10E6/uL (ref 4.14–5.80)
RDW: 12.8 % (ref 11.6–15.4)
WBC: 8.3 10*3/uL (ref 3.4–10.8)

## 2021-11-22 LAB — TSH: TSH: 3.18 u[IU]/mL (ref 0.450–4.500)

## 2021-11-22 LAB — PSA TOTAL (REFLEX TO FREE): Prostate Specific Ag, Serum: 2.7 ng/mL (ref 0.0–4.0)

## 2021-12-28 ENCOUNTER — Other Ambulatory Visit: Payer: Self-pay | Admitting: Family Medicine

## 2021-12-28 DIAGNOSIS — E876 Hypokalemia: Secondary | ICD-10-CM

## 2021-12-31 ENCOUNTER — Encounter: Payer: Self-pay | Admitting: Family Medicine

## 2021-12-31 ENCOUNTER — Ambulatory Visit (INDEPENDENT_AMBULATORY_CARE_PROVIDER_SITE_OTHER): Payer: Medicare HMO | Admitting: Family Medicine

## 2021-12-31 VITALS — BP 108/65 | HR 55 | Temp 97.7°F | Resp 14 | Wt 194.0 lb

## 2021-12-31 DIAGNOSIS — F32A Depression, unspecified: Secondary | ICD-10-CM

## 2021-12-31 DIAGNOSIS — F5102 Adjustment insomnia: Secondary | ICD-10-CM

## 2021-12-31 DIAGNOSIS — R69 Illness, unspecified: Secondary | ICD-10-CM | POA: Diagnosis not present

## 2021-12-31 MED ORDER — SERTRALINE HCL 50 MG PO TABS: ORAL_TABLET | ORAL | Status: DC

## 2021-12-31 NOTE — Progress Notes (Signed)
I,Roshena L Chambers,acting as a scribe for Lelon Huh, MD.,have documented all relevant documentation on the behalf of Lelon Huh, MD,as directed by  Lelon Huh, MD while in the presence of Lelon Huh, MD.   Established patient visit   Patient: Jeffrey Torres   DOB: [birth year]   76 y.o. Male  MRN: [id number] Visit Date: 12/31/2021  Today's healthcare provider: Lelon Huh, MD   Chief Complaint  Patient presents with   Depression   Subjective    HPI  Depression/anxiety, Follow-up  He  was last seen for this on 11/18/2021.   Changes made at last visit include starting sertraline (ZOLOFT) 50 MG tablet; Take 1/2 tablet every morning for 8 days, then increase to 1 tablet every morning.   He reports good compliance with treatment. He is not having side effects.   He reports good tolerance of treatment. Current symptoms include: depressed mood He feels he is Unchanged since last visit.     12/31/2021    8:21 AM 11/18/2021    9:13 AM 11/18/2021    9:12 AM  Depression screen PHQ 2/9  Decreased Interest 1 2 2   Down, Depressed, Hopeless 2 3 3   PHQ - 2 Score 3 5 5   Altered sleeping 2 2 2   Tired, decreased energy 2 2 2   Change in appetite 0 1 1  Feeling bad or failure about yourself  0 2 2  Trouble concentrating 1 3 3   Moving slowly or fidgety/restless 2 1 1   Suicidal thoughts 0 1 1  PHQ-9 Score 10 17 17   Difficult doing work/chores Somewhat difficult Somewhat difficult Somewhat difficult    -----------------------------------------------------------------------------------------   Medications: Outpatient Medications Prior to Visit  Medication Sig   albuterol (VENTOLIN HFA) 108 (90 Base) MCG/ACT inhaler Inhale 2 puffs into the lungs every 6 (six) hours as needed for wheezing or shortness of breath.   aspirin 81 MG tablet Take 81 mg by mouth daily.   diltiazem (CARDIZEM CD) 120 MG 24 hr capsule Take 1 capsule (120 mg total) by mouth daily.   feeding  supplement (ENSURE ENLIVE / ENSURE PLUS) LIQD Take 237 mLs by mouth 3 (three) times daily between meals.   fluticasone (FLONASE) 50 MCG/ACT nasal spray Place 2 sprays into both nostrils every morning.    hydrochlorothiazide (HYDRODIURIL) 25 MG tablet Take 1 tablet (25 mg total) by mouth daily.   KLOR-CON M20 20 MEQ tablet TAKE 1 TABLET BY MOUTH EVERY DAY   Multiple Vitamins-Minerals (PRESERVISION AREDS 2 PO) Take 2 capsules by mouth every morning.    omeprazole (PRILOSEC) 20 MG capsule Take 20 mg by mouth daily before breakfast.   sertraline (ZOLOFT) 50 MG tablet Take 1/2 tablet every morning for 8 days, then increase to 1 tablet every morning   sildenafil (VIAGRA) 50 MG tablet Take 1 tablet (50 mg total) by mouth daily as needed for erectile dysfunction.   tamsulosin (FLOMAX) 0.4 MG CAPS capsule Take 1 capsule (0.4 mg total) by mouth daily after supper.   No facility-administered medications prior to visit.    Review of Systems  Constitutional:  Positive for fatigue. Negative for appetite change, chills and fever.  Respiratory:  Negative for chest tightness, shortness of breath and wheezing.   Cardiovascular:  Negative for chest pain and palpitations.  Gastrointestinal:  Negative for abdominal pain, nausea and vomiting.       Objective    BP 108/65 (BP Location: Right Arm, Patient Position: Sitting)   Pulse Marland Kitchen)  55   Temp 97.7 F (36.5 C) (Oral)   Resp 14   Wt 194 lb (88 kg)   SpO2 99% Comment: room air  BMI 27.06 kg/m    Physical Exam   General appearance: Well developed, well nourished male, cooperative and in no acute distress Head: Normocephalic, without obvious abnormality, atraumatic Respiratory: Respirations even and unlabored, normal respiratory rate Extremities: All extremities are intact.  Skin: Skin color, texture, turgor normal. No rashes seen  Psych: Appropriate mood and affect. Neurologic: Mental status: Alert, oriented to person, place, and time, thought  content appropriate.     Assessment & Plan     1. Depression, unspecified depression type Slightly more fatigued since starting sertraline, although he feels this may be related to weather limiting physical activity.   2. Adjustment insomnia  He attributes this mostly to financial stress.   Increase sertraline (ZOLOFT) 50 MG tablet; to 1 1/2 tablet once a day until gone, then start 100mg  tablet   Future Appointments  Date Time Provider [place]  02/02/2022  8:20 AM Birdie Sons, MD [id number]         The entirety of the information documented in the History of Present Illness, Review of Systems and Physical Exam were personally obtained by me. Portions of this information were initially documented by the CMA and reviewed by me for thoroughness and accuracy.     Lelon Huh, MD  [facility] [phone] (phone) [phone] (fax)  [place]

## 2021-12-31 NOTE — Patient Instructions (Signed)
Please review the attached list of medications and notify my office if there are any errors.   Please bring all of your medications to every appointment so we can make sure that our medication list is the same as yours.   Increase to sertraline to 1 1/2 tablets until your current bottle is gone. Then start taking one 100mg  tablet once a day

## 2022-01-05 DIAGNOSIS — H353221 Exudative age-related macular degeneration, left eye, with active choroidal neovascularization: Secondary | ICD-10-CM | POA: Diagnosis not present

## 2022-01-06 ENCOUNTER — Other Ambulatory Visit: Payer: Self-pay | Admitting: Family Medicine

## 2022-01-06 MED ORDER — SERTRALINE HCL 100 MG PO TABS: 100.0000 mg | ORAL_TABLET | Freq: Every day | ORAL | 1 refills | Status: DC

## 2022-01-16 ENCOUNTER — Other Ambulatory Visit: Payer: Self-pay | Admitting: Family Medicine

## 2022-02-02 ENCOUNTER — Ambulatory Visit (INDEPENDENT_AMBULATORY_CARE_PROVIDER_SITE_OTHER): Payer: Medicare HMO | Admitting: Family Medicine

## 2022-02-02 ENCOUNTER — Encounter: Payer: Self-pay | Admitting: Family Medicine

## 2022-02-02 VITALS — BP 101/62 | HR 59 | Temp 98.5°F | Resp 16 | Wt 200.0 lb

## 2022-02-02 DIAGNOSIS — G4709 Other insomnia: Secondary | ICD-10-CM | POA: Diagnosis not present

## 2022-02-02 DIAGNOSIS — R69 Illness, unspecified: Secondary | ICD-10-CM | POA: Diagnosis not present

## 2022-02-02 DIAGNOSIS — F32A Depression, unspecified: Secondary | ICD-10-CM | POA: Diagnosis not present

## 2022-02-02 MED ORDER — TRAZODONE HCL 100 MG PO TABS: 100.0000 mg | ORAL_TABLET | Freq: Every day | ORAL | 3 refills | Status: DC

## 2022-02-02 NOTE — Progress Notes (Signed)
I,Jana Robinson,acting as a scribe for Lelon Huh, MD.,have documented all relevant documentation on the behalf of Lelon Huh, MD,as directed by  Lelon Huh, MD while in the presence of Lelon Huh, MD.  Established patient visit   Patient: Jeffrey Torres   DOB: December 09, 1945   76 y.o. Male  MRN: [id number] Visit Date: 02/02/2022  Today's healthcare provider: Lelon Huh, MD   Chief Complaint  Patient presents with   Depression   Subjective    HPI  Depression, Follow-up  He  was last seen for this 6 weeks. Changes made at last visit include: Increase sertraline to 1 1/2 tablets until your current bottle is gone. Then start taking one 100mg  tablet once a day   He reports excellent tolerance of treatment. Current symptoms include: depressed mood and fatigue He feels he is Unchanged since last visit.  Mainly complaint is anxiety throughout the day due to financial and having trouble sleeping through the nigh  Usually goes to bed around 8pm and wakes up around midnight and can't go back to sleep.        12/31/2021    8:21 AM 11/18/2021    9:13 AM 11/18/2021    9:12 AM  Depression screen PHQ 2/9  Decreased Interest 1 2 2   Down, Depressed, Hopeless 2 3 3   PHQ - 2 Score 3 5 5   Altered sleeping 2 2 2   Tired, decreased energy 2 2 2   Change in appetite 0 1 1  Feeling bad or failure about yourself  0 2 2  Trouble concentrating 1 3 3   Moving slowly or fidgety/restless 2 1 1   Suicidal thoughts 0 1 1  PHQ-9 Score 10 17 17   Difficult doing work/chores Somewhat difficult Somewhat difficult Somewhat difficult    -----------------------------------------------------------------------------------------   Medications: Outpatient Medications Prior to Visit  Medication Sig   albuterol (VENTOLIN HFA) 108 (90 Base) MCG/ACT inhaler Inhale 2 puffs into the lungs every 6 (six) hours as needed for wheezing or shortness of breath.   aspirin 81 MG tablet Take 81 mg by mouth  daily.   diltiazem (CARDIZEM CD) 120 MG 24 hr capsule Take 1 capsule (120 mg total) by mouth daily.   feeding supplement (ENSURE ENLIVE / ENSURE PLUS) LIQD Take 237 mLs by mouth 3 (three) times daily between meals.   fluticasone (FLONASE) 50 MCG/ACT nasal spray Place 2 sprays into both nostrils every morning.    hydrochlorothiazide (HYDRODIURIL) 25 MG tablet Take 1 tablet (25 mg total) by mouth daily.   KLOR-CON M20 20 MEQ tablet TAKE 1 TABLET BY MOUTH EVERY DAY   Multiple Vitamins-Minerals (PRESERVISION AREDS 2 PO) Take 2 capsules by mouth every morning.    omeprazole (PRILOSEC) 20 MG capsule Take 20 mg by mouth daily before breakfast.   sertraline (ZOLOFT) 100 MG tablet Take 1 tablet (100 mg total) by mouth daily.   sildenafil (VIAGRA) 50 MG tablet Take 1 tablet (50 mg total) by mouth daily as needed for erectile dysfunction.   tamsulosin (FLOMAX) 0.4 MG CAPS capsule Take 1 capsule (0.4 mg total) by mouth daily after supper.   No facility-administered medications prior to visit.    Review of Systems  Respiratory: Negative.  Negative for cough, shortness of breath and wheezing.   Cardiovascular:  Negative for chest pain, palpitations and leg swelling.  Neurological:  Negative for weakness and headaches.       Objective    BP 101/62 (BP Location: Right Arm, Patient Position: Sitting, Cuff  Size: Normal)   Pulse (!) 59   Temp 98.5 F (36.9 C) (Oral)   Resp 16   Wt 200 lb (90.7 kg)   SpO2 99%   BMI 27.89 kg/m    Physical Exam   General: Appearance:    Well developed, well nourished male in no acute distress  Eyes:    PERRL, conjunctiva/corneas clear, EOM's intact       Lungs:     Clear to auscultation bilaterally, respirations unlabored  Heart:    Bradycardic. Normal rhythm. No murmurs, rubs, or gallops.    MS:   All extremities are intact.    Neurologic:   Awake, alert, oriented x 3. No apparent focal neurological defect.         Assessment & Plan     1. Depression,  unspecified depression type Continue sertraline  2. Other insomnia try traZODone (DESYREL) 100 MG tablet; Take 1 tablet (100 mg total) by mouth at bedtime. Take one table at bedtime. May take another 1/2 tablet overnight if needed for sleep  Dispense: 30 tablet; Refill: 3      The entirety of the information documented in the History of Present Illness, Review of Systems and Physical Exam were personally obtained by me. Portions of this information were initially documented by the CMA and reviewed by me for thoroughness and accuracy.     Lelon Huh, MD  [facility] [phone] (phone) [phone] (fax)  [place]

## 2022-02-27 DIAGNOSIS — J1189 Influenza due to unidentified influenza virus with other manifestations: Secondary | ICD-10-CM | POA: Diagnosis not present

## 2022-03-14 ENCOUNTER — Observation Stay
Admission: EM | Admit: 2022-03-14 | Discharge: 2022-03-15 | Disposition: A | Discharge status: Home / Self Care | Payer: Medicare HMO | Attending: Hospitalist | Admitting: Hospitalist

## 2022-03-14 ENCOUNTER — Other Ambulatory Visit: Payer: Self-pay

## 2022-03-14 ENCOUNTER — Emergency Department: Payer: Medicare HMO

## 2022-03-14 DIAGNOSIS — Z8521 Personal history of malignant neoplasm of larynx: Secondary | ICD-10-CM | POA: Diagnosis not present

## 2022-03-14 DIAGNOSIS — F17210 Nicotine dependence, cigarettes, uncomplicated: Secondary | ICD-10-CM | POA: Insufficient documentation

## 2022-03-14 DIAGNOSIS — R001 Bradycardia, unspecified: Secondary | ICD-10-CM

## 2022-03-14 DIAGNOSIS — Z8616 Personal history of COVID-19: Secondary | ICD-10-CM | POA: Insufficient documentation

## 2022-03-14 DIAGNOSIS — E876 Hypokalemia: Secondary | ICD-10-CM | POA: Diagnosis present

## 2022-03-14 DIAGNOSIS — Z79899 Other long term (current) drug therapy: Secondary | ICD-10-CM | POA: Insufficient documentation

## 2022-03-14 DIAGNOSIS — R911 Solitary pulmonary nodule: Secondary | ICD-10-CM | POA: Diagnosis not present

## 2022-03-14 DIAGNOSIS — I498 Other specified cardiac arrhythmias: Secondary | ICD-10-CM | POA: Diagnosis present

## 2022-03-14 DIAGNOSIS — R69 Illness, unspecified: Secondary | ICD-10-CM | POA: Diagnosis not present

## 2022-03-14 DIAGNOSIS — R42 Dizziness and giddiness: Secondary | ICD-10-CM | POA: Diagnosis not present

## 2022-03-14 DIAGNOSIS — I491 Atrial premature depolarization: Secondary | ICD-10-CM | POA: Insufficient documentation

## 2022-03-14 DIAGNOSIS — I10 Essential (primary) hypertension: Principal | ICD-10-CM | POA: Diagnosis present

## 2022-03-14 DIAGNOSIS — F17200 Nicotine dependence, unspecified, uncomplicated: Secondary | ICD-10-CM | POA: Diagnosis present

## 2022-03-14 LAB — COMPREHENSIVE METABOLIC PANEL
ALT: 20 U/L (ref 0–44)
AST: 25 U/L (ref 15–41)
Albumin: 4.1 g/dL (ref 3.5–5.0)
Alkaline Phosphatase: 53 U/L (ref 38–126)
Anion gap: 10 (ref 5–15)
BUN: 15 mg/dL (ref 8–23)
CO2: 26 mmol/L (ref 22–32)
Calcium: 8.8 mg/dL — ABNORMAL LOW (ref 8.9–10.3)
Chloride: 96 mmol/L — ABNORMAL LOW (ref 98–111)
Creatinine, Ser: 0.99 mg/dL (ref 0.61–1.24)
GFR, Estimated: >60 mL/min (ref >60)
Glucose, Bld: 123 mg/dL — ABNORMAL HIGH (ref 70–99)
Potassium: 3.3 mmol/L — ABNORMAL LOW (ref 3.5–5.1)
Sodium: 132 mmol/L — ABNORMAL LOW (ref 135–145)
Total Bilirubin: 0.6 mg/dL (ref 0.3–1.2)
Total Protein: 7.6 g/dL (ref 6.5–8.1)

## 2022-03-14 LAB — CBC
HCT: 45.2 % (ref 39.0–52.0)
Hemoglobin: 15.5 g/dL (ref 13.0–17.0)
MCH: 30.9 pg (ref 26.0–34.0)
MCHC: 34.3 g/dL (ref 30.0–36.0)
MCV: 90.2 fL (ref 80.0–100.0)
Platelets: 247 10*3/uL (ref 150–400)
RBC: 5.01 MIL/uL (ref 4.22–5.81)
RDW: 12.7 % (ref 11.5–15.5)
WBC: 8 10*3/uL (ref 4.0–10.5)
nRBC: 0 % (ref 0.0–0.2)

## 2022-03-14 LAB — TSH: TSH: 4.914 u[IU]/mL — ABNORMAL HIGH (ref 0.350–4.500)

## 2022-03-14 LAB — TROPONIN I (HIGH SENSITIVITY): Troponin I (High Sensitivity): 7 ng/L (ref <18)

## 2022-03-14 LAB — MAGNESIUM: Magnesium: 2.2 mg/dL (ref 1.7–2.4)

## 2022-03-14 LAB — T4, FREE: Free T4: 0.86 ng/dL (ref 0.61–1.12)

## 2022-03-14 MED ORDER — ASPIRIN 81 MG PO TBEC
81.0000 mg | DELAYED_RELEASE_TABLET | Freq: Every day | ORAL | Status: DC
  Administered 2022-03-15: 81 mg via ORAL
  Filled 2022-03-14: qty 1

## 2022-03-14 MED ORDER — FLUTICASONE PROPIONATE 50 MCG/ACT NA SUSP: 2.0000 | NASAL | Status: DC

## 2022-03-14 MED ORDER — SODIUM CHLORIDE 0.9% FLUSH
3.0000 mL | Freq: Two times a day (BID) | INTRAVENOUS | Status: DC
  Administered 2022-03-15: 3 mL via INTRAVENOUS

## 2022-03-14 MED ORDER — ACETAMINOPHEN 325 MG PO TABS: 650.0000 mg | ORAL_TABLET | Freq: Four times a day (QID) | ORAL | Status: DC | PRN

## 2022-03-14 MED ORDER — POTASSIUM CHLORIDE 20 MEQ PO PACK
40.0000 meq | PACK | Freq: Two times a day (BID) | ORAL | Status: DC
  Administered 2022-03-14 – 2022-03-15 (×2): 40 meq via ORAL
  Filled 2022-03-14 (×2): qty 2

## 2022-03-14 MED ORDER — SODIUM CHLORIDE 0.9% FLUSH
3.0000 mL | Freq: Two times a day (BID) | INTRAVENOUS | Status: DC
  Administered 2022-03-14 – 2022-03-15 (×2): 3 mL via INTRAVENOUS

## 2022-03-14 MED ORDER — SERTRALINE HCL 50 MG PO TABS
100.0000 mg | ORAL_TABLET | Freq: Every day | ORAL | Status: DC
  Administered 2022-03-15: 100 mg via ORAL
  Filled 2022-03-14: qty 2

## 2022-03-14 MED ORDER — ALBUTEROL SULFATE (2.5 MG/3ML) 0.083% IN NEBU: 2.5000 mg | INHALATION_SOLUTION | Freq: Four times a day (QID) | RESPIRATORY_TRACT | Status: DC | PRN

## 2022-03-14 MED ORDER — ACETAMINOPHEN 650 MG RE SUPP: 650.0000 mg | Freq: Four times a day (QID) | RECTAL | Status: DC | PRN

## 2022-03-14 MED ORDER — SODIUM CHLORIDE 0.9 % IV BOLUS
1000.0000 mL | Freq: Once | INTRAVENOUS | Status: AC
  Administered 2022-03-14: 1000 mL via INTRAVENOUS

## 2022-03-14 MED ORDER — SODIUM CHLORIDE 0.9 % IV SOLN: 250.0000 mL | INTRAVENOUS | Status: DC | PRN

## 2022-03-14 MED ORDER — SODIUM CHLORIDE 0.9% FLUSH: 3.0000 mL | INTRAVENOUS | Status: DC | PRN

## 2022-03-14 NOTE — Assessment & Plan Note (Addendum)
Pt has atrial bigeminy on ekg:  This is new from previous EKG in march 2023:

## 2022-03-14 NOTE — Assessment & Plan Note (Signed)
-  Nicotine patch 

## 2022-03-14 NOTE — ED Notes (Signed)
See triage note. Blood work completed during triage process by other staff member. Pt alert and ambulatory to room with visitor. Pt steady. Pt in NAD.

## 2022-03-14 NOTE — Assessment & Plan Note (Signed)
    Latest Ref Rng & Units 03/14/2022    6:06 PM 11/21/2021    8:48 AM 11/05/2020   10:01 AM  CMP  Glucose 70 - 99 mg/dL 123  118  137   BUN 8 - 23 mg/dL 15  17  13    Creatinine 0.61 - 1.24 mg/dL 0.99  1.18  1.29   Sodium 135 - 145 mmol/L 132  139  143   Potassium 3.5 - 5.1 mmol/L 3.3  3.7  3.5   Chloride 98 - 111 mmol/L 96  98  100   CO2 22 - 32 mmol/L 26  25  26    Calcium 8.9 - 10.3 mg/dL 8.8  9.6  9.2   Total Protein 6.5 - 8.1 g/dL 7.6  7.1    Total Bilirubin 0.3 - 1.2 mg/dL 0.6  0.2    Alkaline Phos 38 - 126 U/L 53  65    AST 15 - 41 U/L 25  19    ALT 0 - 44 U/L 20  17      Replace and follow levels.

## 2022-03-14 NOTE — Assessment & Plan Note (Signed)
Vitals:   03/14/22 1802 03/14/22 1915 03/14/22 1917 03/14/22 1917  BP: (!) 160/76 130/64 133/65 118/66   03/14/22 1930 03/14/22 2000 03/14/22 2030  BP: (!) 143/62 (!) 158/60 (!) 153/75  Prn hydralazine.

## 2022-03-14 NOTE — H&P (Signed)
History and Physical    Chief Complaint: Dizziness   HISTORY OF PRESENT ILLNESS: Jeffrey Torres is an 76 y.o. male coming to [place] from home, seen today in the emergency room with complaints of dizziness. Patient gives history and does not report any headaches or chest pain or palpitations nausea vomiting fevers chills. Patient also does not report any trouble with his eyes speech or gait. Dizziness has been intermittent and has been happening for the past 2 days.  Pt has PMH of: Past Medical History:  Diagnosis Date   Acute metabolic encephalopathy 10/03/960   Arthritis    Cancer ([place])    larynx ca, surgery and radiation   GERD (gastroesophageal reflux disease)    Heart murmur    Hernia, inguinal, bilateral    History of chicken pox    History of kidney stones    History of measles    History of mumps    History of shingles    HOH (hard of hearing)    Macular degeneration of both eyes    Motion sickness    ocean boat   Pneumonia due to COVID-19 virus 07/31/2020   PVC (premature ventricular contraction)     Review Of Systems: Review of Systems  Neurological:  Positive for dizziness.  All other systems reviewed and are negative.    ALLERGIES: Allergies  Allergen Reactions   Penicillins Anaphylaxis and Swelling    Did it involve swelling of the face/tongue/throat, SOB, or low BP? Yes Did it involve sudden or severe rash/hives, skin peeling, or any reaction on the inside of your mouth or nose? No Did you need to seek medical attention at a hospital or doctor's office? No When did it last happen?      10 + years ago If all above answers are "NO", may proceed with cephalosporin use.     PAST SURGICAL HISTORY: Past Surgical History:  Procedure Laterality Date   CARDIAC CATHETERIZATION  2010   normal per patient report   CATARACT EXTRACTION W/PHACO Right 07/18/2019   Procedure: CATARACT EXTRACTION PHACO AND INTRAOCULAR LENS PLACEMENT (IOC) RIGHT 4.54 00:29.7;  Surgeon:  Birder Robson, MD;  Location: [facility];  Service: Ophthalmology;  Laterality: Right;   CATARACT EXTRACTION W/PHACO Left 08/08/2019   Procedure: CATARACT EXTRACTION PHACO AND INTRAOCULAR LENS PLACEMENT ([place]) LEFT;  Surgeon: Birder Robson, MD;  Location: [facility];  Service: Ophthalmology;  Laterality: Left;  Lot #[id number] H [place]: 00:34.5 CDE: 4.75   COLONOSCOPY  06/24/2006   Dr. Bary Castilla. Multiple benign appearing 21mm polyps in the cecum and in the rectum. -Three 4mm polyps in the transversed colon, in the tranverse colon, proximal and in the distal transverse colon. Resected and retrieved.   COLONOSCOPY WITH PROPOFOL N/A 03/12/2020   Procedure: COLONOSCOPY WITH PROPOFOL;  Surgeon: Lucilla Lame, MD;  Location: [facility];  Service: Endoscopy;  Laterality: N/A;   CYSTOSCOPY WITH HOLMIUM LASER LITHOTRIPSY  2001   kidney stones removed, [facility]   LARYNGOSCOPY Right 04/01/2017   Procedure: SUSPENSION LARYNGOSCOPY WITH MICROFLAP EXCISION;  Surgeon: Carloyn Manner, MD;  Location: [facility];  Service: ENT;  Laterality: Right;   sinus surgery   2002   [place]; removal of polyps   TONSILLECTOMY  1954   Tubular adenoma removed  06/24/2006     SOCIAL HISTORY: Social History   Socioeconomic History   Marital status: Legally Separated    Spouse name: Not on file   Number of children: 2   Years of education: Not on file  Highest education level: 12th grade  Occupational History   Occupation: Truck driver    Comment: unemployed  Tobacco Use   Smoking status: Some Days    Packs/day: 1.00    Types: Cigarettes    Last attempt to quit: 12/2017    Years since quitting: 4.2   Smokeless tobacco: Never   Tobacco comments:    started age 80 yo.previously smoked carton a week  Vaping Use   Vaping Use: Never used  Substance and Sexual Activity   Alcohol use: Not Currently    Alcohol/week: 0.0 standard drinks of alcohol   Drug use: No   Sexual activity: Not on file  Other Topics  Concern   Not on file  Social History Narrative   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   Social Determinants of Health   Financial Resource Strain: Medium Risk (07/23/2020)   Overall Financial Resource Strain (CARDIA)    Difficulty of Paying Living Expenses: Somewhat hard  Food Insecurity: No Food Insecurity (07/23/2020)   Hunger Vital Sign    Worried About Running Out of Food in the Last Year: Never true    Ran Out of Food in the Last Year: Never true  Transportation Needs: No Transportation Needs (07/23/2020)   PRAPARE - Hydrologist (Medical): No    Lack of Transportation (Non-Medical): No  Physical Activity: Inactive (07/23/2020)   Exercise Vital Sign    Days of Exercise per Week: 0 days    Minutes of Exercise per Session: 0 min  Stress: No Stress Concern Present (07/23/2020)   [place]    Feeling of Stress : Not at all  Social Connections: Socially Isolated (07/23/2020)   Social Connection and Isolation Panel [NHANES]    Frequency of Communication with Friends and Family: More than three times a week    Frequency of Social Gatherings with Friends and Family: More than three times a week    Attends Religious Services: Never    Marine scientist or Organizations: No    Attends Archivist Meetings: Never    Marital Status: Separated      CURRENT MEDS:     Current Outpatient Medications (Cardiovascular):    diltiazem (CARDIZEM CD) 120 MG 24 hr capsule, Take 1 capsule (120 mg total) by mouth daily.   hydrochlorothiazide (HYDRODIURIL) 25 MG tablet, Take 1 tablet (25 mg total) by mouth daily.   sildenafil (VIAGRA) 50 MG tablet, Take 1 tablet (50 mg total) by mouth daily as needed for erectile dysfunction.  Current Facility-Administered Medications (Respiratory):    albuterol (VENTOLIN HFA) 108 (90 Base) MCG/ACT inhaler 2 puff   [START ON 03/15/2022] fluticasone  (FLONASE) 50 MCG/ACT nasal spray 2 spray  Current Outpatient Medications (Respiratory):    albuterol (VENTOLIN HFA) 108 (90 Base) MCG/ACT inhaler, Inhale 2 puffs into the lungs every 6 (six) hours as needed for wheezing or shortness of breath.   fluticasone (FLONASE) 50 MCG/ACT nasal spray, Place 2 sprays into both nostrils every morning.   Current Facility-Administered Medications (Analgesics):    acetaminophen (TYLENOL) tablet 650 mg **OR** acetaminophen (TYLENOL) suppository 650 mg   [START ON 03/15/2022] aspirin EC tablet 81 mg  Current Outpatient Medications (Analgesics):    aspirin 81 MG tablet, Take 81 mg by mouth daily.    Current Facility-Administered Medications (Other):    0.9 %  sodium chloride infusion   potassium chloride (KLOR-CON) packet 40 mEq   sertraline (ZOLOFT)  tablet 100 mg   sodium chloride flush (NS) 0.9 % injection 3 mL   sodium chloride flush (NS) 0.9 % injection 3 mL   sodium chloride flush (NS) 0.9 % injection 3 mL  Current Outpatient Medications (Other):    feeding supplement (ENSURE ENLIVE / ENSURE PLUS) LIQD, Take 237 mLs by mouth 3 (three) times daily between meals.   KLOR-CON M20 20 MEQ tablet, TAKE 1 TABLET BY MOUTH EVERY DAY   Multiple Vitamins-Minerals (PRESERVISION AREDS 2 PO), Take 2 capsules by mouth every morning.    omeprazole (PRILOSEC) 20 MG capsule, Take 20 mg by mouth daily before breakfast.   sertraline (ZOLOFT) 100 MG tablet, Take 1 tablet (100 mg total) by mouth daily.   tamsulosin (FLOMAX) 0.4 MG CAPS capsule, Take 1 capsule (0.4 mg total) by mouth daily after supper.   traZODone (DESYREL) 100 MG tablet, Take 1 tablet (100 mg total) by mouth at bedtime. Take one table at bedtime. May take another 1/2 tablet overnight if needed for sleep    ED Course: Pt in Ed in the emergency room patient is alert awake oriented afebrile and denies any complaints. > Vitals:   03/14/22 1917 03/14/22 1930 03/14/22 2000 03/14/22 2030  BP: 118/66 (!)  143/62 (!) 158/60 (!) 153/75  Pulse: (!) 58 (!) 42 (!) 43 (!) 48  Resp:  20 17 12   Temp:   98 F (36.7 C)   TempSrc:   Oral   SpO2:  95% 95% 97%  Weight:      Height:       >Total I/O In: 1004.8 [IV Piggyback:1004.8] Out: -  >SpO2: 97 % > Overall blood work is stable and shows mild hypokalemia otherwise normal CBC and LFTs and kidney function.  Results for orders placed or performed during the hospital encounter of 03/14/22 (from the past 24 hour(s))  CBC     Status: None   Collection Time: 03/14/22  6:06 PM  Result Value Ref Range   WBC 8.0 4.0 - 10.5 K/uL   RBC 5.01 4.22 - 5.81 MIL/uL   Hemoglobin 15.5 13.0 - 17.0 g/dL   HCT 45.2 39.0 - 52.0 %   MCV 90.2 80.0 - 100.0 fL   MCH 30.9 26.0 - 34.0 pg   MCHC 34.3 30.0 - 36.0 g/dL   RDW 12.7 11.5 - 15.5 %   Platelets 247 150 - 400 K/uL   nRBC 0.0 0.0 - 0.2 %  Comprehensive metabolic panel     Status: Abnormal   Collection Time: 03/14/22  6:06 PM  Result Value Ref Range   Sodium 132 (L) 135 - 145 mmol/L   Potassium 3.3 (L) 3.5 - 5.1 mmol/L   Chloride 96 (L) 98 - 111 mmol/L   CO2 26 22 - 32 mmol/L   Glucose, Bld 123 (H) 70 - 99 mg/dL   BUN 15 8 - 23 mg/dL   Creatinine, Ser 0.99 0.61 - 1.24 mg/dL   Calcium 8.8 (L) 8.9 - 10.3 mg/dL   Total Protein 7.6 6.5 - 8.1 g/dL   Albumin 4.1 3.5 - 5.0 g/dL   AST 25 15 - 41 U/L   ALT 20 0 - 44 U/L   Alkaline Phosphatase 53 38 - 126 U/L   Total Bilirubin 0.6 0.3 - 1.2 mg/dL   GFR, Estimated >60 >60 mL/min   Anion gap 10 5 - 15  Troponin I (High Sensitivity)     Status: None   Collection Time: 03/14/22  6:58 PM  Result  Value Ref Range   Troponin I (High Sensitivity) 7 <18 ng/L  Magnesium     Status: None   Collection Time: 03/14/22  7:40 PM  Result Value Ref Range   Magnesium 2.2 1.7 - 2.4 mg/dL   Meds ordered this encounter  Medications   sodium chloride 0.9 % bolus 1,000 mL   potassium chloride (KLOR-CON) packet 40 mEq   albuterol (VENTOLIN HFA) 108 (90 Base) MCG/ACT inhaler 2  puff   aspirin EC tablet 81 mg   fluticasone (FLONASE) 50 MCG/ACT nasal spray 2 spray   sertraline (ZOLOFT) tablet 100 mg   sodium chloride flush (NS) 0.9 % injection 3 mL   OR Linked Order Group    acetaminophen (TYLENOL) tablet 650 mg    acetaminophen (TYLENOL) suppository 650 mg   sodium chloride flush (NS) 0.9 % injection 3 mL   sodium chloride flush (NS) 0.9 % injection 3 mL   0.9 %  sodium chloride infusion       Admission Imaging : DG Chest 2 View  Result Date: 03/14/2022 CLINICAL DATA:  Hypertension EXAM: CHEST - 2 VIEW COMPARISON:  08/07/2020 FINDINGS: The heart size and mediastinal contours are within normal limits. New nodular opacity projects over the medial right upper lobe measuring 1.8 cm in projection. No acute appearing airspace opacity. The visualized skeletal structures are unremarkable. IMPRESSION: 1. New nodular opacity projects over the medial right upper lobe measuring 1.8 cm in projection. Recommend CT chest to further evaluate. 2. No acute appearing airspace opacity. Electronically Signed   By: Delanna Ahmadi M.D.   On: 03/14/2022 19:24      Physical Examination: Vitals:   03/14/22 1917 03/14/22 1930 03/14/22 2000 03/14/22 2030  BP: 118/66 (!) 143/62 (!) 158/60 (!) 153/75  Pulse: (!) 58 (!) 42 (!) 43 (!) 48  Temp:   98 F (36.7 C)   Resp:  20 17 12   Height:      Weight:      SpO2:  95% 95% 97%  TempSrc:   Oral   BMI (Calculated):       Physical Exam Vitals and nursing note reviewed.  Constitutional:      General: He is not in acute distress.    Appearance: Normal appearance. He is not ill-appearing, toxic-appearing or diaphoretic.  HENT:     Head: Normocephalic and atraumatic.     Right Ear: Hearing and external ear normal.     Left Ear: Hearing and external ear normal.     Nose: Nose normal. No nasal deformity.     Mouth/Throat:     Lips: Pink.     Mouth: Mucous membranes are moist.     Tongue: No lesions.  Eyes:     Extraocular Movements:  Extraocular movements intact.     Pupils: Pupils are equal, round, and reactive to light.  Neck:     Vascular: No carotid bruit.  Cardiovascular:     Rate and Rhythm: Bradycardia present. Rhythm irregular.     Pulses: Normal pulses.     Heart sounds: Normal heart sounds.  Pulmonary:     Effort: Pulmonary effort is normal.     Breath sounds: Normal breath sounds.  Abdominal:     General: Bowel sounds are normal. There is no distension.     Palpations: Abdomen is soft. There is no mass.     Tenderness: There is no abdominal tenderness. There is no guarding.     Hernia: No hernia is present.  Musculoskeletal:  Right lower leg: No edema.     Left lower leg: No edema.  Skin:    General: Skin is warm.  Neurological:     General: No focal deficit present.     Mental Status: He is alert and oriented to person, place, and time.     Cranial Nerves: Cranial nerves 2-12 are intact.     Motor: Motor function is intact.  Psychiatric:        Attention and Perception: Attention normal.        Mood and Affect: Mood normal.        Speech: Speech normal.        Behavior: Behavior normal. Behavior is cooperative.        Cognition and Memory: Cognition normal.   Assessment and Plan: * Dizziness Suspect patient symptomatic dizziness secondary to bradycardia, bigeminy on EKG. We will obtain a 2D echocardiogram with bubble study. Cardiology consulted with a.m. message sent. Hold patient's home medication currently. Cardiac biomarkers currently negative.   Atrial bigeminy Pt has atrial bigeminy on ekg:  This is new from previous EKG in march 2023:    Primary hypertension Vitals:   03/14/22 1802 03/14/22 1915 03/14/22 1917 03/14/22 1917  BP: (!) 160/76 130/64 133/65 118/66   03/14/22 1930 03/14/22 2000 03/14/22 2030  BP: (!) 143/62 (!) 158/60 (!) 153/75  Prn hydralazine.    Tobacco dependence Nicotine patch.    Hypokalemia    Latest Ref Rng & Units 03/14/2022    6:06 PM  11/21/2021    8:48 AM 11/05/2020   10:01 AM  CMP  Glucose 70 - 99 mg/dL 123  118  137   BUN 8 - 23 mg/dL 15  17  13    Creatinine 0.61 - 1.24 mg/dL 0.99  1.18  1.29   Sodium 135 - 145 mmol/L 132  139  143   Potassium 3.5 - 5.1 mmol/L 3.3  3.7  3.5   Chloride 98 - 111 mmol/L 96  98  100   CO2 22 - 32 mmol/L 26  25  26    Calcium 8.9 - 10.3 mg/dL 8.8  9.6  9.2   Total Protein 6.5 - 8.1 g/dL 7.6  7.1    Total Bilirubin 0.3 - 1.2 mg/dL 0.6  0.2    Alkaline Phos 38 - 126 U/L 53  65    AST 15 - 41 U/L 25  19    ALT 0 - 44 U/L 20  17      Replace and follow levels.      Unresulted Labs (From admission, onward)     Start     Ordered   03/15/22 0500  Comprehensive metabolic panel  Tomorrow morning,   STAT        03/14/22 2146   03/15/22 0500  CBC  Tomorrow morning,   STAT        03/14/22 2146   03/14/22 2147  TSH  Once,   URGENT        03/14/22 2146   03/14/22 2147  T4, free  Once,   URGENT        03/14/22 2146             DVT prophylaxis:  Heparin    Code Status:  Full code    Family Communication:  Astrid Drafts (Son)  [phone] (Mobile)   Disposition Plan:  Home    Consults called:  Cardiology/ Dr.Crenshaw.   Admission status: Observation    Unit/ Expected LOS:  Progressive / 2 days    Para Skeans MD [facility]  6 PM- 2 AM. Please contact me via secure Chat 6 PM-2 AM. [phone] ( Pager ) To contact the [facility] Attending or Consulting provider [place] or covering provider during after hours [place], for this patient.   Check the care team in [facility] and look for a) attending/consulting [facility] provider listed and b) the [facility] team listed Log into [email or web address] and use 's universal password to access. If you do not have the password, please contact the hospital operator. Locate the [facility] provider you are looking for under [facility] and page to a number that you can be directly reached. If you still have difficulty reaching the  provider, please page the [facility] (Director on Call) for the Hospitalists listed on amion for assistance. [email or web address] 03/14/2022, 9:59 PM

## 2022-03-14 NOTE — ED Provider Notes (Signed)
[facility] Provider Note    Event Date/Time   First MD Initiated Contact with Patient 03/14/22 1823     (approximate)   History   Hypertension   HPI  Jeffrey Torres is a 76 y.o. male with a history of hypertension, obstructive sleep apnea and as listed in EMR presents to the emergency department for evaluation of episodic hypertension.  Patient is currently taking Cardizem and hydrochlorothiazide for blood pressure control.  He states he has not missed any doses. Blood pressure per wrist monitor he has at home has been 180-200 for the past 2-3 days. For the past few mornings, he has felt nauseated but after eating it goes away. He has also had a "few dizzy spells" upon standing when he gets out of bed. No chest pain or shortness of breath. He does occasionally feel palpitations, but relates it to afib and states he can feel when he "goes in and out" of it. No palpitations today.   Physical Exam   Triage Vital Signs: ED Triage Vitals  Enc Vitals Group     BP 03/14/22 1802 (!) 160/76     Pulse Rate 03/14/22 1802 (!) 52     Resp 03/14/22 1802 18     Temp 03/14/22 1802 98.3 F (36.8 C)     Temp Source 03/14/22 1802 Oral     SpO2 03/14/22 1802 98 %     Weight 03/14/22 1803 195 lb (88.5 kg)     Height 03/14/22 1803 5\' 10"  (1.778 m)     Head Circumference --      Peak Flow --      Pain Score 03/14/22 1803 0     Pain Loc --      Pain Edu? --      Excl. in [place]? --     Most recent vital signs: Vitals:   03/14/22 1930 03/14/22 2000  BP: (!) 143/62 (!) 158/60  Pulse: (!) 42 (!) 43  Resp: 20 17  Temp:  98 F (36.7 C)  SpO2: 95% 95%    General: Awake, no distress.  CV:  Good peripheral perfusion. Irregular rhythm. Resp:  Normal effort. Breath sounds clear to auscultation. Abd:  No distention.  Other:     ED Results / Procedures / Treatments   Labs (all labs ordered are listed, but only abnormal results are displayed) Labs Reviewed   COMPREHENSIVE METABOLIC PANEL - Abnormal; Notable for the following components:      Result Value   Sodium 132 (*)    Potassium 3.3 (*)    Chloride 96 (*)    Glucose, Bld 123 (*)    Calcium 8.8 (*)    All other components within normal limits  CBC  TROPONIN I (HIGH SENSITIVITY)     EKG  EKG shows is a ventricular rate of 54, bigeminy   RADIOLOGY  Image interpreted and radiology report reviewed by me.  No acute cardiopulmonary abnormality. Chest x-ray shows a nodular opacity over the right upper lobe.  PROCEDURES:  Critical Care performed: No  Procedures   MEDICATIONS ORDERED IN ED: Medications  potassium chloride (KLOR-CON) packet 40 mEq (40 mEq Oral Given 03/14/22 2025)  sodium chloride 0.9 % bolus 1,000 mL (0 mLs Intravenous Stopped 03/14/22 2049)     IMPRESSION / MDM / ASSESSMENT AND PLAN / ED COURSE   I have reviewed the triage note.  Differential diagnosis includes, but is not limited to symptomatic hypertension, hypertension urgency, cardiac event  The patient is on the cardiac monitor to evaluate for evidence of arrhythmia and/or significant heart rate changes.  76 year old male presenting to the emergency department for treatment and evaluation of hypertension, dizziness, and nausea that has been worse over the past couple of days.  See HPI for further details.   EKG shows bradycardic bigeminy.  Comparison of EKG obtained in March that showed a normal sinus rhythm.  Patient on cardiac monitor with persistent bradycardia in the 40s.  He is orthostatic with position change.  Labs were noted to show mild hyponatremia and hypokalemia.  Fluids and potassium were ordered.  Chest x-ray and troponin ordered.  Chest x-ray shows some concern for new nodular opacity in the right upper lobe.  Troponin is normal.  While discussing results with the patient, he now reports that a couple of weeks ago when he stood up from his recliner he got dizzy and fell.  He denies  striking his head or experiencing loss of consciousness and has not had any additional events since that time.  We discussed admission for symptomatic bradycardia and the patient is agreeable to the plan.  Hospitalist consult requested.  Case discussed with Dr. Posey Pronto who accepts patient for admission/observation.     FINAL CLINICAL IMPRESSION(S) / ED DIAGNOSES   Final diagnoses:  Symptomatic bradycardia     Rx / DC Orders   ED Discharge Orders     None        Note:  This document was prepared using Dragon voice recognition software and may include unintentional dictation errors.   Victorino Dike, FNP 03/14/22 2101    Lucillie Garfinkel, MD 03/15/22 [phone]

## 2022-03-14 NOTE — ED Triage Notes (Signed)
Pt states that he has been having high blood pressure problems where it will randomly get up to 180-200, pt states it will also make him feel dizzy for the last coupe days- pt does take bp medication and states he has not missed any doses

## 2022-03-14 NOTE — Assessment & Plan Note (Signed)
Suspect patient symptomatic dizziness secondary to bradycardia, bigeminy on EKG. We will obtain a 2D echocardiogram with bubble study. Cardiology consulted with a.m. message sent. Hold patient's home medication currently. Cardiac biomarkers currently negative.

## 2022-03-15 ENCOUNTER — Observation Stay: Payer: Medicare HMO

## 2022-03-15 ENCOUNTER — Observation Stay (HOSPITAL_BASED_OUTPATIENT_CLINIC_OR_DEPARTMENT_OTHER)
Admit: 2022-03-15 | Discharge: 2022-03-15 | Disposition: A | Discharge status: Home / Self Care | Payer: Medicare HMO | Attending: Internal Medicine | Admitting: Internal Medicine

## 2022-03-15 DIAGNOSIS — I10 Essential (primary) hypertension: Secondary | ICD-10-CM | POA: Diagnosis not present

## 2022-03-15 DIAGNOSIS — R42 Dizziness and giddiness: Secondary | ICD-10-CM

## 2022-03-15 DIAGNOSIS — R918 Other nonspecific abnormal finding of lung field: Secondary | ICD-10-CM | POA: Diagnosis not present

## 2022-03-15 LAB — ECHOCARDIOGRAM COMPLETE BUBBLE STUDY
Area-P 1/2: 2.62 cm2
S' Lateral: 3.1 cm

## 2022-03-15 LAB — CBC
HCT: 43.8 % (ref 39.0–52.0)
Hemoglobin: 15.1 g/dL (ref 13.0–17.0)
MCH: 30.9 pg (ref 26.0–34.0)
MCHC: 34.5 g/dL (ref 30.0–36.0)
MCV: 89.6 fL (ref 80.0–100.0)
Platelets: 261 10*3/uL (ref 150–400)
RBC: 4.89 MIL/uL (ref 4.22–5.81)
RDW: 12.9 % (ref 11.5–15.5)
WBC: 8 10*3/uL (ref 4.0–10.5)
nRBC: 0 % (ref 0.0–0.2)

## 2022-03-15 LAB — COMPREHENSIVE METABOLIC PANEL
ALT: 20 U/L (ref 0–44)
AST: 30 U/L (ref 15–41)
Albumin: 3.8 g/dL (ref 3.5–5.0)
Alkaline Phosphatase: 47 U/L (ref 38–126)
Anion gap: 10 (ref 5–15)
BUN: 12 mg/dL (ref 8–23)
CO2: 24 mmol/L (ref 22–32)
Calcium: 8.6 mg/dL — ABNORMAL LOW (ref 8.9–10.3)
Chloride: 97 mmol/L — ABNORMAL LOW (ref 98–111)
Creatinine, Ser: 0.98 mg/dL (ref 0.61–1.24)
GFR, Estimated: >60 mL/min (ref >60)
Glucose, Bld: 158 mg/dL — ABNORMAL HIGH (ref 70–99)
Potassium: 3.8 mmol/L (ref 3.5–5.1)
Sodium: 131 mmol/L — ABNORMAL LOW (ref 135–145)
Total Bilirubin: 0.9 mg/dL (ref 0.3–1.2)
Total Protein: 6.8 g/dL (ref 6.5–8.1)

## 2022-03-15 MED ORDER — INFLUENZA VAC A&B SA ADJ QUAD 0.5 ML IM PRSY
0.5000 mL | PREFILLED_SYRINGE | INTRAMUSCULAR | Status: DC
  Filled 2022-03-15: qty 0.5

## 2022-03-15 MED ORDER — POTASSIUM CHLORIDE CRYS ER 20 MEQ PO TBCR: 20.0000 meq | EXTENDED_RELEASE_TABLET | Freq: Every day | ORAL | Status: DC

## 2022-03-15 NOTE — Consult Note (Signed)
[place]  [phone] [phone]  ID: [name] OB: [birth year]  MR#: [id number]  WUX#:[id number]  Patient Care Team: Birdie Sons, MD as PCP - General (Family Medicine) Kate Sable, MD as PCP - Cardiology (Cardiology) Birder Robson, MD as Referring Physician (Ophthalmology) Carloyn Manner, MD as Referring Physician (Otolaryngology) Noreene Filbert, MD as Referring Physician (Radiation Oncology) Isaias Sakai, MD as Referring Physician (Ophthalmology)  CHIEF COMPLAINT: History of laryngeal cancer, pulmonary nodule.  INTERVAL HISTORY: Patient is a 76 year old male who was recently admitted to the hospital with increasing dizziness secondary to cardiac disease.  He was incidentally noted to have a 2 cm partially calcified right upper lobe pulmonary nodule.  He currently feels well and is back to his baseline.  He denies any further dizziness or neurologic complaints.  He denies any recent fevers or illnesses.  He has a good appetite and denies weight loss.  He has no chest pain, shortness of breath, cough, or hemoptysis.  He denies any nausea, vomiting, constipation, or diarrhea.  He has no urinary complaints.  Patient feels back to his baseline and offers no specific complaints today.  REVIEW OF SYSTEMS:   Review of Systems  Constitutional: Negative.  Negative for fever, malaise/fatigue and weight loss.  Respiratory: Negative.  Negative for cough, hemoptysis and shortness of breath.   Cardiovascular: Negative.  Negative for chest pain and leg swelling.  Gastrointestinal: Negative.  Negative for abdominal pain.  Genitourinary: Negative.  Negative for dysuria.  Musculoskeletal: Negative.  Negative for back pain.  Skin: Negative.  Negative for rash.  Neurological: Negative.  Negative for dizziness, focal weakness, weakness and headaches.  Psychiatric/Behavioral: Negative.  The patient is not nervous/anxious.     As per HPI.  Otherwise, a complete review of systems is negative.  PAST MEDICAL HISTORY: Past Medical History:  Diagnosis Date   Acute metabolic encephalopathy 08/03/3662   Arthritis    Cancer ([place])    larynx ca, surgery and radiation   GERD (gastroesophageal reflux disease)    Heart murmur    Hernia, inguinal, bilateral    History of chicken pox    History of kidney stones    History of measles    History of mumps    History of shingles    HOH (hard of hearing)    Macular degeneration of both eyes    Motion sickness    ocean boat   Pneumonia due to COVID-19 virus 07/31/2020   PVC (premature ventricular contraction)     PAST SURGICAL HISTORY: Past Surgical History:  Procedure Laterality Date   CARDIAC CATHETERIZATION  2010   normal per patient report   CATARACT EXTRACTION W/PHACO Right 07/18/2019   Procedure: CATARACT EXTRACTION PHACO AND INTRAOCULAR LENS PLACEMENT ([place]) RIGHT 4.54 00:29.7;  Surgeon: Birder Robson, MD;  Location: [facility];  Service: Ophthalmology;  Laterality: Right;   CATARACT EXTRACTION W/PHACO Left 08/08/2019   Procedure: CATARACT EXTRACTION PHACO AND INTRAOCULAR LENS PLACEMENT ([place]) LEFT;  Surgeon: Birder Robson, MD;  Location: [facility];  Service: Ophthalmology;  Laterality: Left;  Lot #[id number] H [place]: 00:34.5 CDE: 4.75   COLONOSCOPY  06/24/2006   Dr. Bary Castilla. Multiple benign appearing 43mm polyps in the cecum and in the rectum. -Three 36mm polyps in the transversed colon, in the tranverse colon, proximal and in the distal transverse colon. Resected and retrieved.   COLONOSCOPY WITH PROPOFOL N/A 03/12/2020   Procedure: COLONOSCOPY WITH PROPOFOL;  Surgeon: Lucilla Lame, MD;  Location: [facility];  Service: Endoscopy;  Laterality: N/A;   CYSTOSCOPY WITH HOLMIUM LASER LITHOTRIPSY  2001   kidney stones removed, [facility]   LARYNGOSCOPY Right 04/01/2017   Procedure: SUSPENSION LARYNGOSCOPY WITH MICROFLAP EXCISION;  Surgeon: Carloyn Manner, MD;  Location: [facility];   Service: ENT;  Laterality: Right;   sinus surgery   2002   [place]; removal of polyps   TONSILLECTOMY  1954   Tubular adenoma removed  06/24/2006    FAMILY HISTORY: Family History  Problem Relation Age of Onset   Lung cancer Mother    Heart disease Father     ADVANCED DIRECTIVES (Y/N):  @ADVDIR @  HEALTH MAINTENANCE: Social History   Tobacco Use   Smoking status: Some Days    Packs/day: 1.00    Types: Cigarettes    Last attempt to quit: 12/2017    Years since quitting: 4.2   Smokeless tobacco: Never   Tobacco comments:    started age 73 yo.previously smoked carton a week  Vaping Use   Vaping Use: Never used  Substance Use Topics   Alcohol use: Not Currently    Alcohol/week: 0.0 standard drinks of alcohol   Drug use: No     Colonoscopy:  PAP:  Bone density:  Lipid panel:  Allergies  Allergen Reactions   Penicillins Anaphylaxis and Swelling    Did it involve swelling of the face/tongue/throat, SOB, or low BP? Yes Did it involve sudden or severe rash/hives, skin peeling, or any reaction on the inside of your mouth or nose? No Did you need to seek medical attention at a hospital or doctor's office? No When did it last happen?      10 + years ago If all above answers are "NO", may proceed with cephalosporin use.     Current Facility-Administered Medications  Medication Dose Route Frequency Provider Last Rate Last Admin   0.9 %  sodium chloride infusion  250 mL Intravenous PRN Para Skeans, MD       acetaminophen (TYLENOL) tablet 650 mg  650 mg Oral Q6H PRN Para Skeans, MD       Or   acetaminophen (TYLENOL) suppository 650 mg  650 mg Rectal Q6H PRN Para Skeans, MD       albuterol (PROVENTIL) (2.5 MG/3ML) 0.083% nebulizer solution 2.5 mg  2.5 mg Inhalation Q6H PRN Para Skeans, MD       aspirin EC tablet 81 mg  81 mg Oral Daily Florina Ou V, MD   81 mg at 03/15/22 1051   fluticasone (FLONASE) 50 MCG/ACT nasal spray 2 spray  2 spray Each Nare Valrie Hart, MD       potassium chloride (KLOR-CON) packet 40 mEq  40 mEq Oral BID Benita Gutter, RPH   40 mEq at 03/15/22 1050   [START ON 03/16/2022] potassium chloride SA (KLOR-CON M) CR tablet 20 mEq  20 mEq Oral Daily Enzo Bi, MD       sertraline (ZOLOFT) tablet 100 mg  100 mg Oral Daily Florina Ou V, MD   100 mg at 03/15/22 1051   sodium chloride flush (NS) 0.9 % injection 3 mL  3 mL Intravenous Q12H Florina Ou V, MD   3 mL at 03/15/22 1052   sodium chloride flush (NS) 0.9 % injection 3 mL  3 mL Intravenous Q12H Florina Ou V, MD   3 mL at 03/15/22 1051   sodium chloride flush (NS) 0.9 % injection 3 mL  3 mL Intravenous PRN Para Skeans, MD  OBJECTIVE: Vitals:   03/15/22 0923 03/15/22 1235  BP: 120/67 (!) 151/68  Pulse: 100 (!) 54  Resp:  16  Temp:  97.8 F (36.6 C)  SpO2: 98% 98%     Body mass index is 27.98 kg/m.    ECOG FS:0 - Asymptomatic  General: Well-developed, well-nourished, no acute distress. Eyes: Pink conjunctiva, anicteric sclera. HEENT: Normocephalic, moist mucous membranes. Lungs: No audible wheezing or coughing. Heart: Regular rate and rhythm. Abdomen: Soft, nontender, no obvious distention. Musculoskeletal: No edema, cyanosis, or clubbing. Neuro: Alert, answering all questions appropriately. Cranial nerves grossly intact. Skin: No rashes or petechiae noted. Psych: Normal affect. Lymphatics: No cervical, calvicular, axillary or inguinal LAD.   LAB RESULTS:  Lab Results  Component Value Date   NA 131 (L) 03/15/2022   K 3.8 03/15/2022   CL 97 (L) 03/15/2022   CO2 24 03/15/2022   GLUCOSE 158 (H) 03/15/2022   BUN 12 03/15/2022   CREATININE 0.98 03/15/2022   CALCIUM 8.6 (L) 03/15/2022   PROT 6.8 03/15/2022   ALBUMIN 3.8 03/15/2022   AST 30 03/15/2022   ALT 20 03/15/2022   ALKPHOS 47 03/15/2022   BILITOT 0.9 03/15/2022   GFRNONAA >60 03/15/2022   GFRAA 86 08/07/2020    Lab Results  Component Value Date   WBC 8.0 03/15/2022    NEUTROABS 5.3 07/07/2020   HGB 15.1 03/15/2022   HCT 43.8 03/15/2022   MCV 89.6 03/15/2022   PLT 261 03/15/2022     STUDIES: ECHOCARDIOGRAM COMPLETE BUBBLE STUDY  Result Date: 03/15/2022    ECHOCARDIOGRAM REPORT   Patient Name:   Jeffrey Torres Date of Exam: 03/15/2022 Medical Rec #:  [id number]      Height:       70.0 in Accession #:    [id number]     Weight:       195.0 lb Date of Birth:  May 27, 1946      BSA:          2.065 m Patient Age:    76 years       BP:           120/67 mmHg Patient Gender: M              HR:           51 bpm. Exam Location:  Inpatient Procedure: 2D Echo, Cardiac Doppler, Color Doppler and Saline Contrast Bubble            Study Indications:     Dizziness [[id number]]  History:         Patient has prior history of Echocardiogram examinations.                  Abnormal ECG - bigeminy on EKG; Arrythmias: Bradycardia.                  Dizziness has been intermittent and has beed happening for the                  past 2 days.  Sonographer:     Darlina Sicilian RDCS Referring Phys:  Para Skeans Diagnosing Phys: Fransico Him MD IMPRESSIONS  1. Left ventricular ejection fraction, by estimation, is 60 to 65%. The left ventricle has normal function. The left ventricle has no regional wall motion abnormalities. Left ventricular diastolic parameters are indeterminate.  2. Right ventricular systolic function is normal. The right ventricular size is normal. There is normal pulmonary artery systolic pressure. The estimated right ventricular  systolic pressure is 50.0 mmHg.  3. The mitral valve is normal in structure. No evidence of mitral valve regurgitation. No evidence of mitral stenosis.  4. The aortic valve is tricuspid. Aortic valve regurgitation is not visualized. Aortic valve sclerosis/calcification is present, without any evidence of aortic stenosis.  5. The inferior vena cava is normal in size with greater than 50% respiratory variability, suggesting right atrial pressure of 3 mmHg. FINDINGS   Left Ventricle: Left ventricular ejection fraction, by estimation, is 60 to 65%. The left ventricle has normal function. The left ventricle has no regional wall motion abnormalities. The left ventricular internal cavity size was normal in size. There is  no left ventricular hypertrophy. Left ventricular diastolic parameters are indeterminate. Normal left ventricular filling pressure. Right Ventricle: The right ventricular size is normal. No increase in right ventricular wall thickness. Right ventricular systolic function is normal. There is normal pulmonary artery systolic pressure. The tricuspid regurgitant velocity is 2.35 m/s, and  with an assumed right atrial pressure of 3 mmHg, the estimated right ventricular systolic pressure is 93.8 mmHg. Left Atrium: Left atrial size was normal in size. Right Atrium: Right atrial size was normal in size. Pericardium: There is no evidence of pericardial effusion. Mitral Valve: The mitral valve is normal in structure. No evidence of mitral valve regurgitation. No evidence of mitral valve stenosis. Tricuspid Valve: The tricuspid valve is normal in structure. Tricuspid valve regurgitation is trivial. No evidence of tricuspid stenosis. Aortic Valve: The aortic valve is tricuspid. Aortic valve regurgitation is not visualized. Aortic valve sclerosis/calcification is present, without any evidence of aortic stenosis. Pulmonic Valve: The pulmonic valve was normal in structure. Pulmonic valve regurgitation is not visualized. No evidence of pulmonic stenosis. Aorta: The aortic root is normal in size and structure. Venous: The inferior vena cava is normal in size with greater than 50% respiratory variability, suggesting right atrial pressure of 3 mmHg. IAS/Shunts: No atrial level shunt detected by color flow Doppler. Agitated saline contrast was given intravenously to evaluate for intracardiac shunting.  LEFT VENTRICLE PLAX 2D LVIDd:         5.00 cm   Diastology LVIDs:         3.10 cm    LV e' medial:    5.66 cm/s LV PW:         0.90 cm   LV E/e' medial:  12.2 LV IVS:        0.80 cm   LV e' lateral:   9.25 cm/s LVOT diam:     2.10 cm   LV E/e' lateral: 7.5 LV SV:         80 LV SV Index:   39 LVOT Area:     3.46 cm  RIGHT VENTRICLE RV S prime:     11.10 cm/s LEFT ATRIUM             Index        RIGHT ATRIUM           Index LA diam:        3.60 cm 1.74 cm/m   RA Area:     13.30 cm LA Vol (A2C):   34.6 ml 16.76 ml/m  RA Volume:   23.10 ml  11.19 ml/m LA Vol (A4C):   46.5 ml 22.52 ml/m LA Biplane Vol: 42.9 ml 20.78 ml/m  AORTIC VALVE LVOT Vmax:   111.00 cm/s LVOT Vmean:  65.600 cm/s LVOT VTI:    0.230 m  AORTA Ao Root diam: 3.30 cm  Ao Asc diam:  3.30 cm MITRAL VALVE               TRICUSPID VALVE MV Area (PHT): 2.62 cm    TR Peak grad:   22.1 mmHg MV Decel Time: 289 msec    TR Vmax:        235.00 cm/s MV E velocity: 69.00 cm/s MV A velocity: 75.50 cm/s  SHUNTS MV E/A ratio:  0.91        Systemic VTI:  0.23 m                            Systemic Diam: 2.10 cm Fransico Him MD Electronically signed by Fransico Him MD Signature Date/Time: 03/15/2022/1:40:24 PM    Final    CT CHEST WO CONTRAST  Result Date: 03/15/2022 CLINICAL DATA:  Dizziness, lung nodule EXAM: CT CHEST WITHOUT CONTRAST TECHNIQUE: Multidetector CT imaging of the chest was performed following the standard protocol without IV contrast. RADIATION DOSE REDUCTION: This exam was performed according to the departmental dose-optimization program which includes automated exposure control, adjustment of the mA and/or kV according to patient size and/or use of iterative reconstruction technique. COMPARISON:  Chest radiographs done on 03/14/2022 FINDINGS: Cardiovascular: Scattered coronary artery calcifications are seen. Mediastinum/Nodes: There are few slightly enlarged lymph nodes and mediastinum measuring up to 9 mm in short axis. Lungs/Pleura: In image 35 of series 3, there is 2 x 1.7 cm noncalcified nodule in the medial right upper lobe.  There are 2-3 satellite nodules anterior and superior to the larger nodule measuring up to 8 mm in diameter. In image 55 of CC, there is 5 x 8 mm nodule in right upper lobe. There are faint ground-glass densities in mid and lower lung fields. There is no focal consolidation. There is no pleural effusion or pneumothorax. Upper Abdomen: There is increased density in gallbladder suggesting gallbladder stones. There are a few subcentimeter nodes along the lesser curvature aspect of stomach and between the portal vein and inferior vena cava. There is mild hyperplasia of left adrenal. Musculoskeletal: No acute findings are seen. IMPRESSION: There is 2 cm noncalcified nodule in right upper lobe. There are other smaller nodules in right upper lobe measuring up to 8 mm in size. Findings suggest possible malignant neoplasm in right upper lobe with metastatic nodules. Follow-up PET-CT and tissue sampling should be considered. Subcentimeter nodes are seen in mediastinum and upper abdomen. Coronary artery calcifications are seen. Gallbladder stones. Electronically Signed   By: Elmer Picker M.D.   On: 03/15/2022 09:11   DG Chest 2 View  Result Date: 03/14/2022 CLINICAL DATA:  Hypertension EXAM: CHEST - 2 VIEW COMPARISON:  08/07/2020 FINDINGS: The heart size and mediastinal contours are within normal limits. New nodular opacity projects over the medial right upper lobe measuring 1.8 cm in projection. No acute appearing airspace opacity. The visualized skeletal structures are unremarkable. IMPRESSION: 1. New nodular opacity projects over the medial right upper lobe measuring 1.8 cm in projection. Recommend CT chest to further evaluate. 2. No acute appearing airspace opacity. Electronically Signed   By: Delanna Ahmadi M.D.   On: 03/14/2022 19:24    ASSESSMENT: History of laryngeal cancer, pulmonary nodule.  PLAN:    Laryngeal cancer: Patient diagnosed with a stage I squamous cell carcinoma in 2019 that was treated  with XRT only.  Patient reports his most recent endoscopy revealed no evidence of disease. Pulmonary nodule: CT scan results reviewed independently and  report as above with a 2 cm partially calcified right upper lobe pulmonary nodule.  This is unlikely a metastasis.  Upon discharge, will get PET scan to further evaluate and then patient will follow-up in the cancer center 2 to 3 days after his PET scan to discuss the results and any treatment planning necessary. Cardiac disease: Patient now appears back to his baseline. Disposition: Okay to discharge from an oncology standpoint with follow-up as above.   Lloyd Huger, MD   03/15/2022 1:53 PM

## 2022-03-15 NOTE — ED Notes (Signed)
Pt to CT now

## 2022-03-15 NOTE — Progress Notes (Signed)
  Echocardiogram 2D Echocardiogram has been performed.  Darlina Sicilian M 03/15/2022, 10:35 AM

## 2022-03-15 NOTE — ED Notes (Signed)
Advised nurse that patient has ready bed 

## 2022-03-15 NOTE — Discharge Summary (Incomplete)
Physician Discharge Summary   Jeffrey Torres  male DOB: 1945/07/17  YWV:[id number]  PCP: Jeffrey Torres  Admit date: 03/14/2022 Discharge date: 03/15/2022  Admitted From: home Disposition:  home CODE STATUS: Full code   Hospital Course:  For full details, please see H&P, progress notes, consult notes and ancillary notes.  Briefly,  Jeffrey Torres is an 76 y.o. male coming to [place] from home, seen today in the emergency room with complaints of elevated BP associated with dizziness.  # HTN --BP 160/76 on presentation, and was recorded at 120's intermittently without having received any BP meds.  High BP readings at home were measured using wrist BP cuff, and may be erroneous.  Pt was discharged back on home dilt and HCTZ.  # Dizziness --did not have syncope.  Resolved after presentation.  # Bradycardia --EKG showed sinus brady with PAC's.  Cardio consulted by admit physician, however, pt was asymptomatic with stable BP, so no need for formal inpatient cardio consult, per oncall cardiologist.  # Hypokalemia --repleted  # Lung nodule --incidental finding of a 2 cm partially calcified right upper lobe pulmonary nodule.  Oncology consulted.  Pt will follow up with Dr. Grayland Torres as outpatient for further workup.  # Hx of Laryngeal cancer:  Patient diagnosed with a stage I squamous cell carcinoma in 2019 that was treated with XRT only.  Patient reports his most recent endoscopy revealed no evidence of disease.   Unless noted above, medications under "STOP" list are ones pt was not taking PTA.  Discharge Diagnoses:  Principal Problem:   Dizziness Active Problems:   Atrial bigeminy   Primary hypertension   Tobacco dependence   Hypokalemia     Discharge Instructions:  Allergies as of 03/15/2022       Reactions   Penicillins Anaphylaxis, Swelling   Did it involve swelling of the face/tongue/throat, SOB, or low BP? Yes Did it involve sudden or severe rash/hives, skin  peeling, or any reaction on the inside of your mouth or nose? No Did you need to seek medical attention at a hospital or doctor's office? No When did it last happen?      10 + years ago If all above answers are "NO", may proceed with cephalosporin use.        Medication List     STOP taking these medications    albuterol 108 (90 Base) MCG/ACT inhaler Commonly known as: VENTOLIN HFA   feeding supplement Liqd       TAKE these medications    aspirin 81 MG tablet Take 81 mg by mouth daily.   diltiazem 120 MG 24 hr capsule Commonly known as: CARDIZEM CD Take 1 capsule (120 mg total) by mouth daily.   fluticasone 50 MCG/ACT nasal spray Commonly known as: FLONASE Place 2 sprays into both nostrils every morning.   hydrochlorothiazide 25 MG tablet Commonly known as: HYDRODIURIL Take 1 tablet (25 mg total) by mouth daily.   Klor-Con M20 20 MEQ tablet Generic drug: potassium chloride SA TAKE 1 TABLET BY MOUTH EVERY DAY   omeprazole 20 MG capsule Commonly known as: PRILOSEC Take 20 mg by mouth daily before breakfast.   PRESERVISION AREDS 2 PO Take 2 capsules by mouth every morning.   sertraline 100 MG tablet Commonly known as: ZOLOFT Take 1 tablet (100 mg total) by mouth daily.   sildenafil 50 MG tablet Commonly known as: Viagra Take 1 tablet (50 mg total) by mouth daily as needed for erectile dysfunction.  tamsulosin 0.4 MG Caps capsule Commonly known as: FLOMAX Take 1 capsule (0.4 mg total) by mouth daily after supper.   traZODone 100 MG tablet Commonly known as: DESYREL Take 1 tablet (100 mg total) by mouth at bedtime. Take one table at bedtime. May take another 1/2 tablet overnight if needed for sleep         Follow-up Information     Jeffrey Torres, Kathlene November, Torres Follow up.   Specialty: Oncology Contact information: [place] [address] [phone]         Jeffrey Sable, Torres Follow up in 2 week(s).   Specialties:  Cardiology, Radiology Contact information: [address] [place] [address] [phone]                 Allergies  Allergen Reactions   Penicillins Anaphylaxis and Swelling    Did it involve swelling of the face/tongue/throat, SOB, or low BP? Yes Did it involve sudden or severe rash/hives, skin peeling, or any reaction on the inside of your mouth or nose? No Did you need to seek medical attention at a hospital or doctor's office? No When did it last happen?      10 + years ago If all above answers are "NO", may proceed with cephalosporin use.      The results of significant diagnostics from this hospitalization (including imaging, microbiology, ancillary and laboratory) are listed below for reference.   Consultations:   Procedures/Studies: ECHOCARDIOGRAM COMPLETE BUBBLE STUDY  Result Date: 03/15/2022    ECHOCARDIOGRAM REPORT   Patient Name:   Jeffrey Torres Date of Exam: 03/15/2022 Medical Rec #:  [id number]      Height:       70.0 in Accession #:    [id number]     Weight:       195.0 lb Date of Birth:  [birth year]      BSA:          2.065 m Patient Age:    87 years       BP:           120/67 mmHg Patient Gender: M              HR:           51 bpm. Exam Location:  Inpatient Procedure: 2D Echo, Cardiac Doppler, Color Doppler and Saline Contrast Bubble            Study Indications:     Dizziness [[id number]]  History:         Patient has prior history of Echocardiogram examinations.                  Abnormal ECG - bigeminy on EKG; Arrythmias: Bradycardia.                  Dizziness has been intermittent and has beed happening for the                  past 2 days.  Sonographer:     Darlina Sicilian RDCS Referring Phys:  Para Skeans Diagnosing Phys: Jeffrey Torres IMPRESSIONS  1. Left ventricular ejection fraction, by estimation, is 60 to 65%. The left ventricle has normal function. The left ventricle has no regional wall motion abnormalities. Left ventricular diastolic parameters  are indeterminate.  2. Right ventricular systolic function is normal. The right ventricular size is normal. There is normal pulmonary artery systolic pressure. The estimated right ventricular systolic pressure is 25.1  mmHg.  3. The mitral valve is normal in structure. No evidence of mitral valve regurgitation. No evidence of mitral stenosis.  4. The aortic valve is tricuspid. Aortic valve regurgitation is not visualized. Aortic valve sclerosis/calcification is present, without any evidence of aortic stenosis.  5. The inferior vena cava is normal in size with greater than 50% respiratory variability, suggesting right atrial pressure of 3 mmHg. FINDINGS  Left Ventricle: Left ventricular ejection fraction, by estimation, is 60 to 65%. The left ventricle has normal function. The left ventricle has no regional wall motion abnormalities. The left ventricular internal cavity size was normal in size. There is  no left ventricular hypertrophy. Left ventricular diastolic parameters are indeterminate. Normal left ventricular filling pressure. Right Ventricle: The right ventricular size is normal. No increase in right ventricular wall thickness. Right ventricular systolic function is normal. There is normal pulmonary artery systolic pressure. The tricuspid regurgitant velocity is 2.35 m/s, and  with an assumed right atrial pressure of 3 mmHg, the estimated right ventricular systolic pressure is 88.4 mmHg. Left Atrium: Left atrial size was normal in size. Right Atrium: Right atrial size was normal in size. Pericardium: There is no evidence of pericardial effusion. Mitral Valve: The mitral valve is normal in structure. No evidence of mitral valve regurgitation. No evidence of mitral valve stenosis. Tricuspid Valve: The tricuspid valve is normal in structure. Tricuspid valve regurgitation is trivial. No evidence of tricuspid stenosis. Aortic Valve: The aortic valve is tricuspid. Aortic valve regurgitation is not visualized. Aortic  valve sclerosis/calcification is present, without any evidence of aortic stenosis. Pulmonic Valve: The pulmonic valve was normal in structure. Pulmonic valve regurgitation is not visualized. No evidence of pulmonic stenosis. Aorta: The aortic root is normal in size and structure. Venous: The inferior vena cava is normal in size with greater than 50% respiratory variability, suggesting right atrial pressure of 3 mmHg. IAS/Shunts: No atrial level shunt detected by color flow Doppler. Agitated saline contrast was given intravenously to evaluate for intracardiac shunting.  LEFT VENTRICLE PLAX 2D LVIDd:         5.00 cm   Diastology LVIDs:         3.10 cm   LV e' medial:    5.66 cm/s LV PW:         0.90 cm   LV E/e' medial:  12.2 LV IVS:        0.80 cm   LV e' lateral:   9.25 cm/s LVOT diam:     2.10 cm   LV E/e' lateral: 7.5 LV SV:         80 LV SV Index:   39 LVOT Area:     3.46 cm  RIGHT VENTRICLE RV S prime:     11.10 cm/s LEFT ATRIUM             Index        RIGHT ATRIUM           Index LA diam:        3.60 cm 1.74 cm/m   RA Area:     13.30 cm LA Vol (A2C):   34.6 ml 16.76 ml/m  RA Volume:   23.10 ml  11.19 ml/m LA Vol (A4C):   46.5 ml 22.52 ml/m LA Biplane Vol: 42.9 ml 20.78 ml/m  AORTIC VALVE LVOT Vmax:   111.00 cm/s LVOT Vmean:  65.600 cm/s LVOT VTI:    0.230 m  AORTA Ao Root diam: 3.30 cm Ao Asc diam:  3.30  cm MITRAL VALVE               TRICUSPID VALVE MV Area (PHT): 2.62 cm    TR Peak grad:   22.1 mmHg MV Decel Time: 289 msec    TR Vmax:        235.00 cm/s MV E velocity: 69.00 cm/s MV A velocity: 75.50 cm/s  SHUNTS MV E/A ratio:  0.91        Systemic VTI:  0.23 m                            Systemic Diam: 2.10 cm Jeffrey Torres Electronically signed by Jeffrey Torres Signature Date/Time: 03/15/2022/1:40:24 PM    Final    CT CHEST WO CONTRAST  Result Date: 03/15/2022 CLINICAL DATA:  Dizziness, lung nodule EXAM: CT CHEST WITHOUT CONTRAST TECHNIQUE: Multidetector CT imaging of the chest was performed  following the standard protocol without IV contrast. RADIATION DOSE REDUCTION: This exam was performed according to the departmental dose-optimization program which includes automated exposure control, adjustment of the mA and/or kV according to patient size and/or use of iterative reconstruction technique. COMPARISON:  Chest radiographs done on 03/14/2022 FINDINGS: Cardiovascular: Scattered coronary artery calcifications are seen. Mediastinum/Nodes: There are few slightly enlarged lymph nodes and mediastinum measuring up to 9 mm in short axis. Lungs/Pleura: In image 35 of series 3, there is 2 x 1.7 cm noncalcified nodule in the medial right upper lobe. There are 2-3 satellite nodules anterior and superior to the larger nodule measuring up to 8 mm in diameter. In image 55 of CC, there is 5 x 8 mm nodule in right upper lobe. There are faint ground-glass densities in mid and lower lung fields. There is no focal consolidation. There is no pleural effusion or pneumothorax. Upper Abdomen: There is increased density in gallbladder suggesting gallbladder stones. There are a few subcentimeter nodes along the lesser curvature aspect of stomach and between the portal vein and inferior vena cava. There is mild hyperplasia of left adrenal. Musculoskeletal: No acute findings are seen. IMPRESSION: There is 2 cm noncalcified nodule in right upper lobe. There are other smaller nodules in right upper lobe measuring up to 8 mm in size. Findings suggest possible malignant neoplasm in right upper lobe with metastatic nodules. Follow-up PET-CT and tissue sampling should be considered. Subcentimeter nodes are seen in mediastinum and upper abdomen. Coronary artery calcifications are seen. Gallbladder stones. Electronically Signed   By: Elmer Picker M.D.   On: 03/15/2022 09:11   DG Chest 2 View  Result Date: 03/14/2022 CLINICAL DATA:  Hypertension EXAM: CHEST - 2 VIEW COMPARISON:  08/07/2020 FINDINGS: The heart size and  mediastinal contours are within normal limits. New nodular opacity projects over the medial right upper lobe measuring 1.8 cm in projection. No acute appearing airspace opacity. The visualized skeletal structures are unremarkable. IMPRESSION: 1. New nodular opacity projects over the medial right upper lobe measuring 1.8 cm in projection. Recommend CT chest to further evaluate. 2. No acute appearing airspace opacity. Electronically Signed   By: Delanna Ahmadi M.D.   On: 03/14/2022 19:24      Labs: BNP (last 3 results) No results for input(s): "BNP" in the last 8760 hours. Basic Metabolic Panel: Recent Labs  Lab 03/14/22 1806 03/14/22 1940 03/15/22 0924  NA 132*  --  131*  K 3.3*  --  3.8  CL 96*  --  97*  CO2 26  --  24  GLUCOSE 123*  --  158*  BUN 15  --  12  CREATININE 0.99  --  0.98  CALCIUM 8.8*  --  8.6*  MG  --  2.2  --    Liver Function Tests: Recent Labs  Lab 03/14/22 1806 03/15/22 0924  AST 25 30  ALT 20 20  ALKPHOS 53 47  BILITOT 0.6 0.9  PROT 7.6 6.8  ALBUMIN 4.1 3.8   No results for input(s): "LIPASE", "AMYLASE" in the last 168 hours. No results for input(s): "AMMONIA" in the last 168 hours. CBC: Recent Labs  Lab 03/14/22 1806 03/15/22 0924  WBC 8.0 8.0  HGB 15.5 15.1  HCT 45.2 43.8  MCV 90.2 89.6  PLT 247 261   Cardiac Enzymes: No results for input(s): "CKTOTAL", "CKMB", "CKMBINDEX", "TROPONINI" in the last 168 hours. BNP: Invalid input(s): "POCBNP" CBG: No results for input(s): "GLUCAP" in the last 168 hours. D-Dimer No results for input(s): "DDIMER" in the last 72 hours. Hgb A1c No results for input(s): "HGBA1C" in the last 72 hours. Lipid Profile No results for input(s): "CHOL", "HDL", "LDLCALC", "TRIG", "CHOLHDL", "LDLDIRECT" in the last 72 hours. Thyroid function studies Recent Labs    03/14/22 1806  TSH 4.914*   Anemia work up No results for input(s): "VITAMINB12", "FOLATE", "FERRITIN", "TIBC", "IRON", "RETICCTPCT" in the last 72  hours. Urinalysis    Component Value Date/Time   COLORURINE AMBER (A) 07/07/2020 0353   APPEARANCEUR HAZY (A) 07/07/2020 0353   LABSPEC 1.029 07/07/2020 0353   PHURINE 6.0 07/07/2020 0353   GLUCOSEU NEGATIVE 07/07/2020 0353   HGBUR MODERATE (A) 07/07/2020 0353   BILIRUBINUR NEGATIVE 07/07/2020 0353   KETONESUR NEGATIVE 07/07/2020 0353   PROTEINUR 100 (A) 07/07/2020 0353   NITRITE NEGATIVE 07/07/2020 [place] 07/07/2020 0353   Sepsis Labs Recent Labs  Lab 03/14/22 1806 03/15/22 0924  WBC 8.0 8.0   Microbiology No results found for this or any previous visit (from the past 240 hour(s)).   Total time spend on discharging this patient, including the last patient exam, discussing the hospital stay, instructions for ongoing care as it relates to all pertinent caregivers, as well as preparing the medical discharge records, prescriptions, and/or referrals as applicable, is 40 minutes.    Enzo Bi, Torres  [facility] 03/15/2022, 3:27 PM

## 2022-03-15 NOTE — ED Notes (Signed)
Float RN will bring pt to room once CT chest complete.

## 2022-03-16 ENCOUNTER — Encounter: Payer: Self-pay | Admitting: *Deleted

## 2022-03-16 DIAGNOSIS — R911 Solitary pulmonary nodule: Secondary | ICD-10-CM

## 2022-03-16 NOTE — Progress Notes (Signed)
Spoke with patient and he is aware of all upcoming appts. Instructed to call back with any further questions or needs. Pt verbalized understanding.

## 2022-03-16 NOTE — Progress Notes (Signed)
Per Dr. [name], pt needs further workup of lung nodule with PET scan and follow up with him a few days after scan. Appts have been scheduled. Message left with patient to call back to review upcoming appts. Awaiting call back.

## 2022-03-20 DIAGNOSIS — R911 Solitary pulmonary nodule: Secondary | ICD-10-CM | POA: Insufficient documentation

## 2022-03-20 NOTE — Progress Notes (Deleted)
[facility]  [phone] [phone]) [phone]  ID: Jeffrey Torres OB: [birth year]  MR#: [id number]  YBO#:[id number]  Patient Care Team: Birdie Sons, MD as PCP - General (Family Medicine) Kate Sable, MD as PCP - Cardiology (Cardiology) Birder Robson, MD as Referring Physician (Ophthalmology) Carloyn Manner, MD as Referring Physician (Otolaryngology) Noreene Filbert, MD as Referring Physician (Radiation Oncology) Isaias Sakai, MD as Referring Physician (Ophthalmology) Telford Nab, RN as Oncology Nurse Navigator  CHIEF COMPLAINT: History of laryngeal cancer, pulmonary nodule  INTERVAL HISTORY: Patient is a 76 year old male who was recently admitted to the hospital with increasing dizziness secondary to cardiac disease.  He was incidentally noted to have a 2 cm partially calcified right upper lobe pulmonary nodule.  He currently feels well and is back to his baseline.  He denies any further dizziness or neurologic complaints.  He denies any recent fevers or illnesses.  He has a good appetite and denies weight loss.  He has no chest pain, shortness of breath, cough, or hemoptysis.  He denies any nausea, vomiting, constipation, or diarrhea.  He has no urinary complaints.  Patient feels back to his baseline and offers no specific complaints today  REVIEW OF SYSTEMS:   ROS  As per HPI. Otherwise, a complete review of systems is negative.  PAST MEDICAL HISTORY: Past Medical History:  Diagnosis Date   Acute metabolic encephalopathy 08/05/7822   Arthritis    Cancer ([place])    larynx ca, surgery and radiation   GERD (gastroesophageal reflux disease)    Heart murmur    Hernia, inguinal, bilateral    History of chicken pox    History of kidney stones    History of measles    History of mumps    History of shingles    HOH (hard of hearing)    Macular degeneration of both eyes    Motion sickness    ocean boat   Pneumonia due to COVID-19 virus  07/31/2020   PVC (premature ventricular contraction)     PAST SURGICAL HISTORY: Past Surgical History:  Procedure Laterality Date   CARDIAC CATHETERIZATION  2010   normal per patient report   CATARACT EXTRACTION W/PHACO Right 07/18/2019   Procedure: CATARACT EXTRACTION PHACO AND INTRAOCULAR LENS PLACEMENT ([place]) RIGHT 4.54 00:29.7;  Surgeon: Birder Robson, MD;  Location: [facility];  Service: Ophthalmology;  Laterality: Right;   CATARACT EXTRACTION W/PHACO Left 08/08/2019   Procedure: CATARACT EXTRACTION PHACO AND INTRAOCULAR LENS PLACEMENT ([place]) LEFT;  Surgeon: Birder Robson, MD;  Location: [facility];  Service: Ophthalmology;  Laterality: Left;  Lot #[id number] H [place]: 00:34.5 CDE: 4.75   COLONOSCOPY  06/24/2006   Dr. Bary Castilla. Multiple benign appearing 74mm polyps in the cecum and in the rectum. -Three 57mm polyps in the transversed colon, in the tranverse colon, proximal and in the distal transverse colon. Resected and retrieved.   COLONOSCOPY WITH PROPOFOL N/A 03/12/2020   Procedure: COLONOSCOPY WITH PROPOFOL;  Surgeon: Lucilla Lame, MD;  [facility];  Service: Endoscopy;  Laterality: N/A;   CYSTOSCOPY WITH HOLMIUM LASER LITHOTRIPSY  2001   kidney stones removed, [facility]   LARYNGOSCOPY Right 04/01/2017   Procedure: SUSPENSION LARYNGOSCOPY WITH MICROFLAP EXCISION;  Surgeon: Carloyn Manner, MD;  Location: [facility] OR[facility];  Service: ENT;  Laterality: Right;   sinus surgery   2002   [place]; removal of polyps   TONSILLECTOMY  1954   Tubular adenoma removed  06/24/2006    FAMILY HISTORY: Family History  Problem Relation Age of  Onset   Lung cancer Mother    Heart disease Father     ADVANCED DIRECTIVES (Y/N):  N  HEALTH MAINTENANCE: Social History   Tobacco Use   Smoking status: Some Days    Packs/day: 1.00    Types: Cigarettes    Last attempt to quit: 12/2017    Years since quitting: 4.2   Smokeless tobacco: Never   Tobacco comments:    started age 12  yo.previously smoked carton a week  Vaping Use   Vaping Use: Never used  Substance Use Topics   Alcohol use: Not Currently    Alcohol/week: 0.0 standard drinks of alcohol   Drug use: No     Colonoscopy:  PAP:  Bone density:  Lipid panel:  Allergies  Allergen Reactions   Penicillins Anaphylaxis and Swelling    Did it involve swelling of the face/tongue/throat, SOB, or low BP? Yes Did it involve sudden or severe rash/hives, skin peeling, or any reaction on the inside of your mouth or nose? No Did you need to seek medical attention at a hospital or doctor's office? No When did it last happen?      10 + years ago If all above answers are "NO", may proceed with cephalosporin use.     Current Outpatient Medications  Medication Sig Dispense Refill   aspirin 81 MG tablet Take 81 mg by mouth daily.     diltiazem (CARDIZEM CD) 120 MG 24 hr capsule Take 1 capsule (120 mg total) by mouth daily. 90 capsule 3   fluticasone (FLONASE) 50 MCG/ACT nasal spray Place 2 sprays into both nostrils every morning.      hydrochlorothiazide (HYDRODIURIL) 25 MG tablet Take 1 tablet (25 mg total) by mouth daily. 90 tablet 3   KLOR-CON M20 20 MEQ tablet TAKE 1 TABLET BY MOUTH EVERY DAY 90 tablet 4   Multiple Vitamins-Minerals (PRESERVISION AREDS 2 PO) Take 2 capsules by mouth every morning.      omeprazole (PRILOSEC) 20 MG capsule Take 20 mg by mouth daily before breakfast.     sertraline (ZOLOFT) 100 MG tablet Take 1 tablet (100 mg total) by mouth daily. 90 tablet 1   sildenafil (VIAGRA) 50 MG tablet Take 1 tablet (50 mg total) by mouth daily as needed for erectile dysfunction. 30 tablet 2   tamsulosin (FLOMAX) 0.4 MG CAPS capsule Take 1 capsule (0.4 mg total) by mouth daily after supper. 30 capsule 1   traZODone (DESYREL) 100 MG tablet Take 1 tablet (100 mg total) by mouth at bedtime. Take one table at bedtime. May take another 1/2 tablet overnight if needed for sleep 30 tablet 3   No current  facility-administered medications for this visit.    OBJECTIVE: There were no vitals filed for this visit.   There is no height or weight on file to calculate BMI.    ECOG FS:{CHL ONC [id number]  General: Well-developed, well-nourished, no acute distress. Eyes: Pink conjunctiva, anicteric sclera. HEENT: Normocephalic, moist mucous membranes. Lungs: No audible wheezing or coughing. Heart: Regular rate and rhythm. Abdomen: Soft, nontender, no obvious distention. Musculoskeletal: No edema, cyanosis, or clubbing. Neuro: Alert, answering all questions appropriately. Cranial nerves grossly intact. Skin: No rashes or petechiae noted. Psych: Normal affect. Lymphatics: No cervical, calvicular, axillary or inguinal LAD.   LAB RESULTS:  Lab Results  Component Value Date   NA 131 (L) 03/15/2022   K 3.8 03/15/2022   CL 97 (L) 03/15/2022   CO2 24 03/15/2022   GLUCOSE 158 (H) 03/15/2022  BUN 12 03/15/2022   CREATININE 0.98 03/15/2022   CALCIUM 8.6 (L) 03/15/2022   PROT 6.8 03/15/2022   ALBUMIN 3.8 03/15/2022   AST 30 03/15/2022   ALT 20 03/15/2022   ALKPHOS 47 03/15/2022   BILITOT 0.9 03/15/2022   GFRNONAA >60 03/15/2022   GFRAA 86 08/07/2020    Lab Results  Component Value Date   WBC 8.0 03/15/2022   NEUTROABS 5.3 07/07/2020   HGB 15.1 03/15/2022   HCT 43.8 03/15/2022   MCV 89.6 03/15/2022   PLT 261 03/15/2022     STUDIES: ECHOCARDIOGRAM COMPLETE BUBBLE STUDY  Result Date: 03/15/2022    ECHOCARDIOGRAM REPORT   Patient Name:   Jeffrey Torres Date of Exam: 03/15/2022 Medical Rec #:  [id number]      Height:       70.0 in Accession #:    [id number]     Weight:       195.0 lb Date of Birth:  January 31, 1946      BSA:          2.065 m Patient Age:    40 years       BP:           120/67 mmHg Patient Gender: M              HR:           51 bpm. Exam Location:  Inpatient Procedure: 2D Echo, Cardiac Doppler, Color Doppler and Saline Contrast Bubble            Study Indications:      Dizziness [[id number]]  History:         Patient has prior history of Echocardiogram examinations.                  Abnormal ECG - bigeminy on EKG; Arrythmias: Bradycardia.                  Dizziness has been intermittent and has beed happening for the                  past 2 days.  Sonographer:     Darlina Sicilian RDCS Referring Phys:  Para Skeans Diagnosing Phys: Fransico Him MD IMPRESSIONS  1. Left ventricular ejection fraction, by estimation, is 60 to 65%. The left ventricle has normal function. The left ventricle has no regional wall motion abnormalities. Left ventricular diastolic parameters are indeterminate.  2. Right ventricular systolic function is normal. The right ventricular size is normal. There is normal pulmonary artery systolic pressure. The estimated right ventricular systolic pressure is 19.3 mmHg.  3. The mitral valve is normal in structure. No evidence of mitral valve regurgitation. No evidence of mitral stenosis.  4. The aortic valve is tricuspid. Aortic valve regurgitation is not visualized. Aortic valve sclerosis/calcification is present, without any evidence of aortic stenosis.  5. The inferior vena cava is normal in size with greater than 50% respiratory variability, suggesting right atrial pressure of 3 mmHg. FINDINGS  Left Ventricle: Left ventricular ejection fraction, by estimation, is 60 to 65%. The left ventricle has normal function. The left ventricle has no regional wall motion abnormalities. The left ventricular internal cavity size was normal in size. There is  no left ventricular hypertrophy. Left ventricular diastolic parameters are indeterminate. Normal left ventricular filling pressure. Right Ventricle: The right ventricular size is normal. No increase in right ventricular wall thickness. Right ventricular systolic function is normal. There is normal pulmonary artery systolic pressure. The  tricuspid regurgitant velocity is 2.35 m/s, and  with an assumed right atrial pressure of 3  mmHg, the estimated right ventricular systolic pressure is 84.1 mmHg. Left Atrium: Left atrial size was normal in size. Right Atrium: Right atrial size was normal in size. Pericardium: There is no evidence of pericardial effusion. Mitral Valve: The mitral valve is normal in structure. No evidence of mitral valve regurgitation. No evidence of mitral valve stenosis. Tricuspid Valve: The tricuspid valve is normal in structure. Tricuspid valve regurgitation is trivial. No evidence of tricuspid stenosis. Aortic Valve: The aortic valve is tricuspid. Aortic valve regurgitation is not visualized. Aortic valve sclerosis/calcification is present, without any evidence of aortic stenosis. Pulmonic Valve: The pulmonic valve was normal in structure. Pulmonic valve regurgitation is not visualized. No evidence of pulmonic stenosis. Aorta: The aortic root is normal in size and structure. Venous: The inferior vena cava is normal in size with greater than 50% respiratory variability, suggesting right atrial pressure of 3 mmHg. IAS/Shunts: No atrial level shunt detected by color flow Doppler. Agitated saline contrast was given intravenously to evaluate for intracardiac shunting.  LEFT VENTRICLE PLAX 2D LVIDd:         5.00 cm   Diastology LVIDs:         3.10 cm   LV e' medial:    5.66 cm/s LV PW:         0.90 cm   LV E/e' medial:  12.2 LV IVS:        0.80 cm   LV e' lateral:   9.25 cm/s LVOT diam:     2.10 cm   LV E/e' lateral: 7.5 LV SV:         80 LV SV Index:   39 LVOT Area:     3.46 cm  RIGHT VENTRICLE RV S prime:     11.10 cm/s LEFT ATRIUM             Index        RIGHT ATRIUM           Index LA diam:        3.60 cm 1.74 cm/m   RA Area:     13.30 cm LA Vol (A2C):   34.6 ml 16.76 ml/m  RA Volume:   23.10 ml  11.19 ml/m LA Vol (A4C):   46.5 ml 22.52 ml/m LA Biplane Vol: 42.9 ml 20.78 ml/m  AORTIC VALVE LVOT Vmax:   111.00 cm/s LVOT Vmean:  65.600 cm/s LVOT VTI:    0.230 m  AORTA Ao Root diam: 3.30 cm Ao Asc diam:  3.30 cm  MITRAL VALVE               TRICUSPID VALVE MV Area (PHT): 2.62 cm    TR Peak grad:   22.1 mmHg MV Decel Time: 289 msec    TR Vmax:        235.00 cm/s MV E velocity: 69.00 cm/s MV A velocity: 75.50 cm/s  SHUNTS MV E/A ratio:  0.91        Systemic VTI:  0.23 m                            Systemic Diam: 2.10 cm Fransico Him MD Electronically signed by Fransico Him MD Signature Date/Time: 03/15/2022/1:40:24 PM    Final    CT CHEST WO CONTRAST  Result Date: 03/15/2022 CLINICAL DATA:  Dizziness, lung nodule EXAM: CT CHEST WITHOUT CONTRAST TECHNIQUE:  Multidetector CT imaging of the chest was performed following the standard protocol without IV contrast. RADIATION DOSE REDUCTION: This exam was performed according to the departmental dose-optimization program which includes automated exposure control, adjustment of the mA and/or kV according to patient size and/or use of iterative reconstruction technique. COMPARISON:  Chest radiographs done on 03/14/2022 FINDINGS: Cardiovascular: Scattered coronary artery calcifications are seen. Mediastinum/Nodes: There are few slightly enlarged lymph nodes and mediastinum measuring up to 9 mm in short axis. Lungs/Pleura: In image 35 of series 3, there is 2 x 1.7 cm noncalcified nodule in the medial right upper lobe. There are 2-3 satellite nodules anterior and superior to the larger nodule measuring up to 8 mm in diameter. In image 55 of CC, there is 5 x 8 mm nodule in right upper lobe. There are faint ground-glass densities in mid and lower lung fields. There is no focal consolidation. There is no pleural effusion or pneumothorax. Upper Abdomen: There is increased density in gallbladder suggesting gallbladder stones. There are a few subcentimeter nodes along the lesser curvature aspect of stomach and between the portal vein and inferior vena cava. There is mild hyperplasia of left adrenal. Musculoskeletal: No acute findings are seen. IMPRESSION: There is 2 cm noncalcified nodule in  right upper lobe. There are other smaller nodules in right upper lobe measuring up to 8 mm in size. Findings suggest possible malignant neoplasm in right upper lobe with metastatic nodules. Follow-up PET-CT and tissue sampling should be considered. Subcentimeter nodes are seen in mediastinum and upper abdomen. Coronary artery calcifications are seen. Gallbladder stones. Electronically Signed   By: Elmer Picker M.D.   On: 03/15/2022 09:11   DG Chest 2 View  Result Date: 03/14/2022 CLINICAL DATA:  Hypertension EXAM: CHEST - 2 VIEW COMPARISON:  08/07/2020 FINDINGS: The heart size and mediastinal contours are within normal limits. New nodular opacity projects over the medial right upper lobe measuring 1.8 cm in projection. No acute appearing airspace opacity. The visualized skeletal structures are unremarkable. IMPRESSION: 1. New nodular opacity projects over the medial right upper lobe measuring 1.8 cm in projection. Recommend CT chest to further evaluate. 2. No acute appearing airspace opacity. Electronically Signed   By: Delanna Ahmadi M.D.   On: 03/14/2022 19:24    ASSESSMENT: History of laryngeal cancer, pulmonary nodule.  PLAN:    Laryngeal cancer: Patient diagnosed with a stage I squamous cell carcinoma in 2019 that was treated with XRT only.  Patient reports his most recent endoscopy revealed no evidence of disease. Pulmonary nodule: CT scan results reviewed independently and report as above with a 2 cm partially calcified right upper lobe pulmonary nodule.  This is unlikely a metastasis.  Upon discharge, will get PET scan to further evaluate and then patient will follow-up in the cancer center 2 to 3 days after his PET scan to discuss the results and any treatment planning necessary. Cardiac disease: Patient now appears back to his baseline.  Patient expressed understanding and was in agreement with this plan. He also understands that He can call clinic at any time with any questions,  concerns, or complaints.    Cancer Staging  No matching staging information was found for the patient.  Lloyd Huger, MD   03/20/2022 8:53 AM

## 2022-03-23 ENCOUNTER — Ambulatory Visit: Payer: Medicare HMO

## 2022-03-26 ENCOUNTER — Ambulatory Visit: Payer: Medicare HMO | Admitting: Oncology

## 2022-03-26 DIAGNOSIS — R911 Solitary pulmonary nodule: Secondary | ICD-10-CM

## 2022-03-27 ENCOUNTER — Other Ambulatory Visit: Payer: Self-pay | Admitting: Family Medicine

## 2022-03-27 NOTE — Telephone Encounter (Signed)
No longer current dosing of this medication Requested Prescriptions  Pending Prescriptions Disp Refills  . sertraline (ZOLOFT) 50 MG tablet [Pharmacy Med Name: SERTRALINE HCL 50 MG TABLET] 30 tablet 1    Sig: TAKE 1/2 TABLET EVERY MORNING FOR 8 DAYS, THEN INCREASE TO 1 TABLET EVERY MORNING     Psychiatry:  Antidepressants - SSRI - sertraline Passed - 03/27/2022  2:14 PM      Passed - AST in normal range and within 360 days    AST  Date Value Ref Range Status  03/15/2022 30 15 - 41 U/L Final         Passed - ALT in normal range and within 360 days    ALT  Date Value Ref Range Status  03/15/2022 20 0 - 44 U/L Final         Passed - Completed PHQ-2 or PHQ-9 in the last 360 days      Passed - Valid encounter within last 6 months    Recent Outpatient Visits          1 month ago Depression, unspecified depression type   [facility] Birdie Sons, MD   2 months ago Depression, unspecified depression type   [facility] Birdie Sons, MD   4 months ago Annual physical exam   [facility] Birdie Sons, MD   1 year ago Prediabetes   [facility] Birdie Sons, MD   1 year ago Prostate cancer screening   [facility] Birdie Sons, MD

## 2022-04-01 ENCOUNTER — Other Ambulatory Visit: Payer: Medicare HMO

## 2022-04-08 ENCOUNTER — Ambulatory Visit
Admission: RE | Admit: 2022-04-08 | Discharge: 2022-04-08 | Disposition: A | Discharge status: Home / Self Care | Payer: Medicare HMO | Source: Ambulatory Visit | Attending: Oncology | Admitting: Oncology

## 2022-04-08 DIAGNOSIS — R911 Solitary pulmonary nodule: Secondary | ICD-10-CM | POA: Insufficient documentation

## 2022-04-08 DIAGNOSIS — I700 Atherosclerosis of aorta: Secondary | ICD-10-CM | POA: Diagnosis not present

## 2022-04-08 DIAGNOSIS — Z8589 Personal history of malignant neoplasm of other organs and systems: Secondary | ICD-10-CM | POA: Insufficient documentation

## 2022-04-08 LAB — GLUCOSE, CAPILLARY: Glucose-Capillary: 117 mg/dL — ABNORMAL HIGH (ref 70–99)

## 2022-04-08 MED ORDER — FLUDEOXYGLUCOSE F - 18 (FDG) INJECTION
10.9600 | Freq: Once | INTRAVENOUS | Status: AC | PRN
  Administered 2022-04-08: 10.96 via INTRAVENOUS

## 2022-04-09 ENCOUNTER — Ambulatory Visit: Payer: Medicare HMO | Admitting: Oncology

## 2022-04-09 NOTE — Progress Notes (Signed)
[place]  [phone] [phone]  ID: Jeffrey Torres OB: [birth year]  MR#: [id number]  QIO#:[id number]  Patient Care Team: Birdie Sons, MD as PCP - General (Family Medicine) Kate Sable, MD as PCP - Cardiology (Cardiology) Birder Robson, MD as Referring Physician (Ophthalmology) Carloyn Manner, MD as Referring Physician (Otolaryngology) Noreene Filbert, MD as Referring Physician (Radiation Oncology) Isaias Sakai, MD as Referring Physician (Ophthalmology) Telford Nab, RN as Oncology Nurse Navigator  CHIEF COMPLAINT: History of laryngeal cancer, PET positive pulmonary nodule  INTERVAL HISTORY: Patient returns to clinic today for hospital follow-up and discussion of his PET scan results.  His symptoms have resolved and he is now back to his baseline.  He currently feels well. He denies any recent fevers or illnesses.  He has a good appetite and denies weight loss.  He has no chest pain, shortness of breath, cough, or hemoptysis.  He denies any nausea, vomiting, constipation, or diarrhea.  He has no urinary complaints.  Patient offers no specific complaints today.  REVIEW OF SYSTEMS:   Review of Systems  Constitutional: Negative.  Negative for fever, malaise/fatigue and weight loss.  Respiratory: Negative.  Negative for cough, hemoptysis and shortness of breath.   Cardiovascular: Negative.  Negative for chest pain and leg swelling.  Gastrointestinal: Negative.  Negative for abdominal pain.  Genitourinary: Negative.  Negative for dysuria.  Musculoskeletal: Negative.  Negative for back pain.  Skin: Negative.  Negative for rash.  Neurological: Negative.  Negative for dizziness, focal weakness, weakness and headaches.  Psychiatric/Behavioral: Negative.  The patient is not nervous/anxious.     As per HPI. Otherwise, a complete review of systems is negative.  PAST MEDICAL HISTORY: Past Medical History:  Diagnosis Date   Acute  metabolic encephalopathy 08/29/4399   Arthritis    Cancer ([place])    larynx ca, surgery and radiation   GERD (gastroesophageal reflux disease)    Heart murmur    Hernia, inguinal, bilateral    History of chicken pox    History of kidney stones    History of measles    History of mumps    History of shingles    HOH (hard of hearing)    Macular degeneration of both eyes    Motion sickness    ocean boat   Pneumonia due to COVID-19 virus 07/31/2020   PVC (premature ventricular contraction)     PAST SURGICAL HISTORY: Past Surgical History:  Procedure Laterality Date   CARDIAC CATHETERIZATION  2010   normal per patient report   CATARACT EXTRACTION W/PHACO Right 07/18/2019   Procedure: CATARACT EXTRACTION PHACO AND INTRAOCULAR LENS PLACEMENT ([place]) RIGHT 4.54 00:29.7;  Surgeon: Birder Robson, MD;  Location: [facility];  Service: Ophthalmology;  Laterality: Right;   CATARACT EXTRACTION W/PHACO Left 08/08/2019   Procedure: CATARACT EXTRACTION PHACO AND INTRAOCULAR LENS PLACEMENT ([place]) LEFT;  Surgeon: Birder Robson, MD;  Location: [facility];  Service: Ophthalmology;  Laterality: Left;  Lot #[id number] H [place]: 00:34.5 CDE: 4.75   COLONOSCOPY  06/24/2006   Dr. Bary Castilla. Multiple benign appearing 29mm polyps in the cecum and in the rectum. -Three 18mm polyps in the transversed colon, in the tranverse colon, proximal and in the distal transverse colon. Resected and retrieved.   COLONOSCOPY WITH PROPOFOL N/A 03/12/2020   Procedure: COLONOSCOPY WITH PROPOFOL;  Surgeon: Lucilla Lame, MD;  Location: [facility] ENDOSCOPY;  Service: Endoscopy;  Laterality: N/A;   CYSTOSCOPY WITH HOLMIUM LASER LITHOTRIPSY  2001   kidney stones removed, [facility]   LARYNGOSCOPY  Right 04/01/2017   Procedure: SUSPENSION LARYNGOSCOPY WITH MICROFLAP EXCISION;  Surgeon: Carloyn Manner, MD;  Location: [facility] OR[facility];  Service: ENT;  Laterality: Right;   sinus surgery   2002   [place]; removal of polyps   TONSILLECTOMY  1954   Tubular  adenoma removed  06/24/2006    FAMILY HISTORY: Family History  Problem Relation Age of Onset   Lung cancer Mother    Heart disease Father     ADVANCED DIRECTIVES (Y/N):  N  HEALTH MAINTENANCE: Social History   Tobacco Use   Smoking status: Some Days    Packs/day: 1.00    Types: Cigarettes    Last attempt to quit: 12/2017    Years since quitting: 4.2   Smokeless tobacco: Never   Tobacco comments:    started age 71 yo.previously smoked carton a week  Vaping Use   Vaping Use: Never used  Substance Use Topics   Alcohol use: Not Currently    Alcohol/week: 0.0 standard drinks of alcohol   Drug use: No     Colonoscopy:  PAP:  Bone density:  Lipid panel:  Allergies  Allergen Reactions   Penicillins Anaphylaxis and Swelling    Did it involve swelling of the face/tongue/throat, SOB, or low BP? Yes Did it involve sudden or severe rash/hives, skin peeling, or any reaction on the inside of your mouth or nose? No Did you need to seek medical attention at a hospital or doctor's office? No When did it last happen?      10 + years ago If all above answers are "NO", may proceed with cephalosporin use.     Current Outpatient Medications  Medication Sig Dispense Refill   aspirin 81 MG tablet Take 81 mg by mouth daily.     diltiazem (CARDIZEM CD) 120 MG 24 hr capsule Take 1 capsule (120 mg total) by mouth daily. 90 capsule 3   fluticasone (FLONASE) 50 MCG/ACT nasal spray Place 2 sprays into both nostrils every morning.      hydrochlorothiazide (HYDRODIURIL) 25 MG tablet Take 1 tablet (25 mg total) by mouth daily. 90 tablet 3   KLOR-CON M20 20 MEQ tablet TAKE 1 TABLET BY MOUTH EVERY DAY 90 tablet 4   Multiple Vitamins-Minerals (PRESERVISION AREDS 2 PO) Take 2 capsules by mouth every morning.      omeprazole (PRILOSEC) 20 MG capsule Take 20 mg by mouth daily before breakfast.     sertraline (ZOLOFT) 100 MG tablet Take 1 tablet (100 mg total) by mouth daily. 90 tablet 1   sildenafil  (VIAGRA) 50 MG tablet Take 1 tablet (50 mg total) by mouth daily as needed for erectile dysfunction. 30 tablet 2   tamsulosin (FLOMAX) 0.4 MG CAPS capsule Take 1 capsule (0.4 mg total) by mouth daily after supper. 30 capsule 1   traZODone (DESYREL) 100 MG tablet Take 1 tablet (100 mg total) by mouth at bedtime. Take one table at bedtime. May take another 1/2 tablet overnight if needed for sleep (Patient not taking: Reported on 04/14/2022) 30 tablet 3   No current facility-administered medications for this visit.    OBJECTIVE: Vitals:   04/14/22 0915  BP: (!) 142/70  Pulse: 65  Resp: 16  Temp: 99.6 F (37.6 C)  SpO2: 99%     Body mass index is 28.58 kg/m.    ECOG FS:0 - Asymptomatic  General: Well-developed, well-nourished, no acute distress. Eyes: Pink conjunctiva, anicteric sclera. HEENT: Normocephalic, moist mucous membranes. Lungs: No audible wheezing or coughing. Heart:  Regular rate and rhythm. Abdomen: Soft, nontender, no obvious distention. Musculoskeletal: No edema, cyanosis, or clubbing. Neuro: Alert, answering all questions appropriately. Cranial nerves grossly intact. Skin: No rashes or petechiae noted. Psych: Normal affect. Lymphatics: No cervical, calvicular, axillary or inguinal LAD.   LAB RESULTS:  Lab Results  Component Value Date   NA 131 (L) 03/15/2022   K 3.8 03/15/2022   CL 97 (L) 03/15/2022   CO2 24 03/15/2022   GLUCOSE 158 (H) 03/15/2022   BUN 12 03/15/2022   CREATININE 0.98 03/15/2022   CALCIUM 8.6 (L) 03/15/2022   PROT 6.8 03/15/2022   ALBUMIN 3.8 03/15/2022   AST 30 03/15/2022   ALT 20 03/15/2022   ALKPHOS 47 03/15/2022   BILITOT 0.9 03/15/2022   GFRNONAA >60 03/15/2022   GFRAA 86 08/07/2020    Lab Results  Component Value Date   WBC 8.0 03/15/2022   NEUTROABS 5.3 07/07/2020   HGB 15.1 03/15/2022   HCT 43.8 03/15/2022   MCV 89.6 03/15/2022   PLT 261 03/15/2022     STUDIES: NM PET Image Initial (PI) Skull Base To Thigh  Result  Date: 04/10/2022 CLINICAL DATA:  Initial treatment strategy for pulmonary nodule. History of laryngeal cancer. EXAM: NUCLEAR MEDICINE PET SKULL BASE TO THIGH TECHNIQUE: 10.96 mCi F-18 FDG was injected intravenously. Full-ring PET imaging was performed from the skull base to thigh after the radiotracer. CT data was obtained and used for attenuation correction and anatomic localization. Fasting blood glucose: 117 mg/dl COMPARISON:  Chest CT March 15, 2022 FINDINGS: Mediastinal blood pool activity: SUV max 1.43 Liver activity: SUV max NA NECK: No hypermetabolic cervical adenopathy. Asymmetric mild hypermetabolic activity in the left posterior nasopharynx on axial fused image 27. Without discrete CT correlate with a max SUV of 2.84. Incidental CT findings: None. CHEST: Hypermetabolic lobular right upper lobe pulmonary nodule measures 2 cm on image 87/2 with a max SUV of 12.9. Two adjacent satellite nodules which measure up to 8 mm on image 84/2 which demonstrated a max SUV of 2.4. No hypermetabolic thoracic adenopathy. Incidental CT findings: Coronary artery calcifications. Aortic atherosclerosis. ABDOMEN/PELVIS: No abnormal hypermetabolic activity within the liver, pancreas, adrenal glands, or spleen. No hypermetabolic lymph nodes in the abdomen or pelvis. Short segment of hypermetabolic activity in the ascending colon with associated wall thickening versus underdistention on axial image 22/2 and a max SUV of 6.5. Incidental CT findings: Nonobstructive 6 mm right renal calculus. Aortic atherosclerosis. SKELETON: No focal hypermetabolic activity to suggest skeletal metastasis. Incidental CT findings: Multilevel degenerative change of the spine. IMPRESSION: 1. Hypermetabolic 2 cm lobular right upper lobe pulmonary nodule, compatible with primary bronchogenic neoplasm or less likely metastatic disease, suggest further evaluation with direct tissue sampling. 2. Two adjacent mildly metabolic satellite pulmonary nodules  measuring up to 8 mm are suspicious for disease involvement. 3. No hypermetabolic thoracic adenopathy. 4. No convincing evidence of hypermetabolic distant metastatic disease. 5. Short segment hypermetabolic activity in the ascending colon with associated wall thickening versus underdistention, recommend correlation with colonoscopy. 6. Asymmetric mild hypermetabolic activity in the left posterior nasopharynx without discrete CT correlate, suggest correlation with direct visualization. 7.  Aortic Atherosclerosis (ICD10-I70.0). Electronically Signed   By: Dahlia Bailiff M.D.   On: 04/10/2022 09:44   ECHOCARDIOGRAM COMPLETE BUBBLE STUDY  Result Date: 03/15/2022    ECHOCARDIOGRAM REPORT   Patient Name:   Jeffrey Torres Date of Exam: 03/15/2022 Medical Rec #:  [id number]      Height:  70.0 in Accession #:    [id number]     Weight:       195.0 lb Date of Birth:  08/21/1945      BSA:          2.065 m Patient Age:    76 years       BP:           120/67 mmHg Patient Gender: M              HR:           51 bpm. Exam Location:  Inpatient Procedure: 2D Echo, Cardiac Doppler, Color Doppler and Saline Contrast Bubble            Study Indications:     Dizziness [[id number]]  History:         Patient has prior history of Echocardiogram examinations.                  Abnormal ECG - bigeminy on EKG; Arrythmias: Bradycardia.                  Dizziness has been intermittent and has beed happening for the                  past 2 days.  Sonographer:     Darlina Sicilian RDCS Referring Phys:  Para Skeans Diagnosing Phys: Fransico Him MD IMPRESSIONS  1. Left ventricular ejection fraction, by estimation, is 60 to 65%. The left ventricle has normal function. The left ventricle has no regional wall motion abnormalities. Left ventricular diastolic parameters are indeterminate.  2. Right ventricular systolic function is normal. The right ventricular size is normal. There is normal pulmonary artery systolic pressure. The estimated right  ventricular systolic pressure is 78.6 mmHg.  3. The mitral valve is normal in structure. No evidence of mitral valve regurgitation. No evidence of mitral stenosis.  4. The aortic valve is tricuspid. Aortic valve regurgitation is not visualized. Aortic valve sclerosis/calcification is present, without any evidence of aortic stenosis.  5. The inferior vena cava is normal in size with greater than 50% respiratory variability, suggesting right atrial pressure of 3 mmHg. FINDINGS  Left Ventricle: Left ventricular ejection fraction, by estimation, is 60 to 65%. The left ventricle has normal function. The left ventricle has no regional wall motion abnormalities. The left ventricular internal cavity size was normal in size. There is  no left ventricular hypertrophy. Left ventricular diastolic parameters are indeterminate. Normal left ventricular filling pressure. Right Ventricle: The right ventricular size is normal. No increase in right ventricular wall thickness. Right ventricular systolic function is normal. There is normal pulmonary artery systolic pressure. The tricuspid regurgitant velocity is 2.35 m/s, and  with an assumed right atrial pressure of 3 mmHg, the estimated right ventricular systolic pressure is 76.7 mmHg. Left Atrium: Left atrial size was normal in size. Right Atrium: Right atrial size was normal in size. Pericardium: There is no evidence of pericardial effusion. Mitral Valve: The mitral valve is normal in structure. No evidence of mitral valve regurgitation. No evidence of mitral valve stenosis. Tricuspid Valve: The tricuspid valve is normal in structure. Tricuspid valve regurgitation is trivial. No evidence of tricuspid stenosis. Aortic Valve: The aortic valve is tricuspid. Aortic valve regurgitation is not visualized. Aortic valve sclerosis/calcification is present, without any evidence of aortic stenosis. Pulmonic Valve: The pulmonic valve was normal in structure. Pulmonic valve regurgitation is not  visualized. No evidence of pulmonic stenosis. Aorta: The aortic root  is normal in size and structure. Venous: The inferior vena cava is normal in size with greater than 50% respiratory variability, suggesting right atrial pressure of 3 mmHg. IAS/Shunts: No atrial level shunt detected by color flow Doppler. Agitated saline contrast was given intravenously to evaluate for intracardiac shunting.  LEFT VENTRICLE PLAX 2D LVIDd:         5.00 cm   Diastology LVIDs:         3.10 cm   LV e' medial:    5.66 cm/s LV PW:         0.90 cm   LV E/e' medial:  12.2 LV IVS:        0.80 cm   LV e' lateral:   9.25 cm/s LVOT diam:     2.10 cm   LV E/e' lateral: 7.5 LV SV:         80 LV SV Index:   39 LVOT Area:     3.46 cm  RIGHT VENTRICLE RV S prime:     11.10 cm/s LEFT ATRIUM             Index        RIGHT ATRIUM           Index LA diam:        3.60 cm 1.74 cm/m   RA Area:     13.30 cm LA Vol (A2C):   34.6 ml 16.76 ml/m  RA Volume:   23.10 ml  11.19 ml/m LA Vol (A4C):   46.5 ml 22.52 ml/m LA Biplane Vol: 42.9 ml 20.78 ml/m  AORTIC VALVE LVOT Vmax:   111.00 cm/s LVOT Vmean:  65.600 cm/s LVOT VTI:    0.230 m  AORTA Ao Root diam: 3.30 cm Ao Asc diam:  3.30 cm MITRAL VALVE               TRICUSPID VALVE MV Area (PHT): 2.62 cm    TR Peak grad:   22.1 mmHg MV Decel Time: 289 msec    TR Vmax:        235.00 cm/s MV E velocity: 69.00 cm/s MV A velocity: 75.50 cm/s  SHUNTS MV E/A ratio:  0.91        Systemic VTI:  0.23 m                            Systemic Diam: 2.10 cm Fransico Him MD Electronically signed by Fransico Him MD Signature Date/Time: 03/15/2022/1:40:24 PM    Final     ASSESSMENT: History of laryngeal cancer, PET positive pulmonary nodule.  PLAN:    Laryngeal cancer: Patient diagnosed with a stage I squamous cell carcinoma in 2019 that was treated with XRT only.  Patient reports his most recent endoscopy revealed no evidence of disease. Pulmonary nodule: PET scan results from April 10, 2022 reviewed independently and  reported as above with hypermetabolic 2 cm lesion in the right upper lobe.  Highly suspicious for malignancy.  Patient was given a referral to pulmonology for biopsy.  Return to clinic 1 week after his biopsy to discuss the results and treatment planning.  Patient will also have consultation with radiation oncology that day.  We briefly discussed surgery versus XRT.  Given patient's underlying cardiac disease, surgery may be difficult.  Cardiac disease: Close follow-up with cardiology.  Patient expressed understanding and was in agreement with this plan. He also understands that He can call clinic at any time with any  questions, concerns, or complaints.    Cancer Staging  No matching staging information was found for the patient.  Lloyd Huger, MD   04/14/2022 10:13 AM

## 2022-04-14 ENCOUNTER — Encounter: Payer: Self-pay | Admitting: *Deleted

## 2022-04-14 ENCOUNTER — Encounter: Payer: Self-pay | Admitting: Oncology

## 2022-04-14 ENCOUNTER — Inpatient Hospital Stay: Payer: Medicare HMO | Attending: Oncology | Admitting: Oncology

## 2022-04-14 ENCOUNTER — Telehealth: Payer: Self-pay

## 2022-04-14 VITALS — BP 142/70 | HR 65 | Temp 99.6°F | Resp 16 | Ht 70.0 in | Wt 199.2 lb

## 2022-04-14 DIAGNOSIS — Z08 Encounter for follow-up examination after completed treatment for malignant neoplasm: Secondary | ICD-10-CM | POA: Diagnosis not present

## 2022-04-14 DIAGNOSIS — Z8521 Personal history of malignant neoplasm of larynx: Secondary | ICD-10-CM | POA: Insufficient documentation

## 2022-04-14 DIAGNOSIS — R911 Solitary pulmonary nodule: Secondary | ICD-10-CM | POA: Diagnosis not present

## 2022-04-14 DIAGNOSIS — R918 Other nonspecific abnormal finding of lung field: Secondary | ICD-10-CM | POA: Diagnosis not present

## 2022-04-14 NOTE — Telephone Encounter (Signed)
Lm x2 for patient.

## 2022-04-14 NOTE — Telephone Encounter (Signed)
Lm for patient's son, Shannon(EC).

## 2022-04-14 NOTE — Telephone Encounter (Signed)
Received referral from cancer center. Lm for patient to offer appt this afternoon.

## 2022-04-14 NOTE — Progress Notes (Signed)
Met with patient during hospital follow up visit with Dr. [name] to review results from recent PET scan. All questions answered during visit. Pt informed that will place referral to pulmonary to evaluate him for bronchoscopy. Pt informed to expect phone call from their clinic with an appt. Contact info given and instructed to call with any questions or needs. Will schedule follow up visit with Dr. [name] and Dr. Baruch Gouty about 1 week after bronchoscopy once scheduled. Pt verbalized understanding. Nothing further needed at this time.

## 2022-04-15 NOTE — Telephone Encounter (Signed)
Spoke to patient and scheduled appt 04/21/2022 at 3:00. Nothing further needed.

## 2022-04-17 ENCOUNTER — Ambulatory Visit: Payer: Medicare HMO | Attending: Cardiology | Admitting: Cardiology

## 2022-04-17 ENCOUNTER — Encounter: Payer: Self-pay | Admitting: Cardiology

## 2022-04-17 VITALS — BP 132/68 | HR 61 | Ht 70.0 in | Wt 198.4 lb

## 2022-04-17 DIAGNOSIS — I2510 Atherosclerotic heart disease of native coronary artery without angina pectoris: Secondary | ICD-10-CM

## 2022-04-17 DIAGNOSIS — Z01818 Encounter for other preprocedural examination: Secondary | ICD-10-CM

## 2022-04-17 DIAGNOSIS — I10 Essential (primary) hypertension: Secondary | ICD-10-CM

## 2022-04-17 MED ORDER — PRAVASTATIN SODIUM 20 MG PO TABS: 20.0000 mg | ORAL_TABLET | Freq: Every evening | ORAL | 3 refills | Status: DC

## 2022-04-17 NOTE — Progress Notes (Signed)
Cardiology Office Note:    Date:  04/17/2022   ID:  Jeffrey Torres, DOB 05/20/1946, MRN [id number]  PCP:  Birdie Sons, MD  [facility] Cardiologist:  Kate Sable, MD  [facility] Electrophysiologist:  None   Referring MD: Birdie Sons, MD   Chief Complaint  Patient presents with   Follow-up    ECHO follow up, no new cardiac concerns    History of Present Illness:    Jeffrey Torres is a 76 y.o. male with a hx of CAD (LAD and RCA calcifications on chest CT 10/23), hypertension, OSA, former smoker x35+ years who presents for hospital follow-up.    Recently seen in the hospital 03/15/2022 due to elevated blood pressure and dizziness.  BP in the hospital appeared normal, elevated blood pressures deemed erroneous.  Compliant with medications including Cardizem and HCTZ as previously recommended.  Echocardiogram 02/2022 showed normal systolic function EF 60 to 65%.  Chest CT 03/15/2022 showed LAD and RCA calcifications.  Right upper lobe pulmonary nodule also noted.  Follow-up PET scan showed nodule was hypermetabolic suggesting neoplasm.  Biopsy being considered.  He otherwise states feeling well, blood pressures have been controlled on current medications.  Denies chest pain.  Prior notes.   Echo 02/2022 EF 60 to 65%, aortic valve sclerosis diagnosed with OSA, CPAP mask recommended but patient does not like to wear a mask.  He will try to lose weight to see if this will help with his sleep apnea.     Past Medical History:  Diagnosis Date   Acute metabolic encephalopathy 01/31/2777   Arthritis    Cancer ([place])    larynx ca, surgery and radiation   GERD (gastroesophageal reflux disease)    Heart murmur    Hernia, inguinal, bilateral    History of chicken pox    History of kidney stones    History of measles    History of mumps    History of shingles    HOH (hard of hearing)    Macular degeneration of both eyes    Motion sickness    ocean boat   Pneumonia due to  COVID-19 virus 07/31/2020   PVC (premature ventricular contraction)     Past Surgical History:  Procedure Laterality Date   CARDIAC CATHETERIZATION  2010   normal per patient report   CATARACT EXTRACTION W/PHACO Right 07/18/2019   Procedure: CATARACT EXTRACTION PHACO AND INTRAOCULAR LENS PLACEMENT ([place]) RIGHT 4.54 00:29.7;  Surgeon: Birder Robson, MD;  Location: [place];  Service: Ophthalmology;  Laterality: Right;   CATARACT EXTRACTION W/PHACO Left 08/08/2019   Procedure: CATARACT EXTRACTION PHACO AND INTRAOCULAR LENS PLACEMENT ([place]) LEFT;  Surgeon: Birder Robson, MD;  Location: [facility];  Service: Ophthalmology;  Laterality: Left;  Lot #[id number] H [place]: 00:34.5 CDE: 4.75   COLONOSCOPY  06/24/2006   Dr. Bary Castilla. Multiple benign appearing 6mm polyps in the cecum and in the rectum. -Three 53mm polyps in the transversed colon, in the tranverse colon, proximal and in the distal transverse colon. Resected and retrieved.   COLONOSCOPY WITH PROPOFOL N/A 03/12/2020   Procedure: COLONOSCOPY WITH PROPOFOL;  Surgeon: Lucilla Lame, MD;  Location: [facility];  Service: Endoscopy;  Laterality: N/A;   CYSTOSCOPY WITH HOLMIUM LASER LITHOTRIPSY  2001   kidney stones removed, [facility]   LARYNGOSCOPY Right 04/01/2017   Procedure: SUSPENSION LARYNGOSCOPY WITH MICROFLAP EXCISION;  Surgeon: Carloyn Manner, MD;  Location: [facility];  Service: ENT;  Laterality: Right;   sinus surgery   2002  [place]; removal of polyps   TONSILLECTOMY  1954   Tubular adenoma removed  06/24/2006    Current Medications: Current Meds  Medication Sig   aspirin 81 MG tablet Take 81 mg by mouth daily.   diltiazem (CARDIZEM CD) 120 MG 24 hr capsule Take 1 capsule (120 mg total) by mouth daily.   fluticasone (FLONASE) 50 MCG/ACT nasal spray Place 2 sprays into both nostrils every morning.    hydrochlorothiazide (HYDRODIURIL) 25 MG tablet Take 1 tablet (25 mg total) by mouth daily.   KLOR-CON M20 20 MEQ tablet TAKE 1  TABLET BY MOUTH EVERY DAY   Multiple Vitamins-Minerals (PRESERVISION AREDS 2 PO) Take 2 capsules by mouth every morning.    omeprazole (PRILOSEC) 20 MG capsule Take 20 mg by mouth daily before breakfast.   pravastatin (PRAVACHOL) 20 MG tablet Take 1 tablet (20 mg total) by mouth every evening.   sertraline (ZOLOFT) 100 MG tablet Take 1 tablet (100 mg total) by mouth daily.   sildenafil (VIAGRA) 50 MG tablet Take 1 tablet (50 mg total) by mouth daily as needed for erectile dysfunction.   tamsulosin (FLOMAX) 0.4 MG CAPS capsule Take 1 capsule (0.4 mg total) by mouth daily after supper.     Allergies:   Penicillins   Social History   Socioeconomic History   Marital status: Legally Separated    Spouse name: Not on file   Number of children: 2   Years of education: Not on file   Highest education level: 12th grade  Occupational History   Occupation: Truck driver    Comment: unemployed  Tobacco Use   Smoking status: Some Days    Packs/day: 1.00    Types: Cigarettes    Last attempt to quit: 12/2017    Years since quitting: 4.3   Smokeless tobacco: Never   Tobacco comments:    started age 38 yo.previously smoked carton a week  Vaping Use   Vaping Use: Never used  Substance and Sexual Activity   Alcohol use: Not Currently    Alcohol/week: 0.0 standard drinks of alcohol   Drug use: No   Sexual activity: Not on file  Other Topics Concern   Not on file  Social History Narrative   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   Social Determinants of Health   Financial Resource Strain: Medium Risk (07/23/2020)   Overall Financial Resource Strain (CARDIA)    Difficulty of Paying Living Expenses: Somewhat hard  Food Insecurity: No Food Insecurity (03/15/2022)   Hunger Vital Sign    Worried About Running Out of Food in the Last Year: Never true    Ran Out of Food in the Last Year: Never true  Transportation Needs: No Transportation Needs (03/15/2022)   PRAPARE - Civil engineer, contracting (Medical): No    Lack of Transportation (Non-Medical): No  Physical Activity: Inactive (07/23/2020)   Exercise Vital Sign    Days of Exercise per Week: 0 days    Minutes of Exercise per Session: 0 min  Stress: No Stress Concern Present (07/23/2020)   [place]    Feeling of Stress : Not at all  Social Connections: Socially Isolated (07/23/2020)   Social Connection and Isolation Panel [NHANES]    Frequency of Communication with Friends and Family: More than three times a week    Frequency of Social Gatherings with Friends and Family: More than three times a week    Attends Religious Services:  Never    Active Member of Clubs or Organizations: No    Attends Archivist Meetings: Never    Marital Status: Separated     Family History: The patient's family history includes Heart disease in his father; Lung cancer in his mother.  ROS:   Please see the history of present illness.     All other systems reviewed and are negative.  EKGs/Labs/Other Studies Reviewed:    The following studies were reviewed today:   EKG:  EKG is  ordered today.  The ekg ordered today demonstrates normal sinus rhythm, PACs.  Recent Labs: 03/14/2022: Magnesium 2.2; TSH 4.914 03/15/2022: ALT 20; BUN 12; Creatinine, Ser 0.98; Hemoglobin 15.1; Platelets 261; Potassium 3.8; Sodium 131  Recent Lipid Panel    Component Value Date/Time   CHOL 154 11/21/2021 0848   TRIG 76 11/21/2021 0848   HDL 49 11/21/2021 0848   CHOLHDL 3.1 11/21/2021 0848   LDLCALC 90 11/21/2021 0848    Physical Exam:    VS:  BP 132/68 (BP Location: Left Arm, Patient Position: Sitting, Cuff Size: Normal)   Pulse 61   Ht 5\' 10"  (1.778 m)   Wt 198 lb 6.4 oz (90 kg)   SpO2 96%   BMI 28.47 kg/m     Wt Readings from Last 3 Encounters:  04/17/22 198 lb 6.4 oz (90 kg)  04/14/22 199 lb 3.2 oz (90.4 kg)  03/14/22 195 lb (88.5 kg)     GEN:  Well  nourished, well developed in no acute distress HEENT: Normal NECK: No JVD; No carotid bruits CARDIAC: RRR, no murmurs, rubs, gallops RESPIRATORY:  Clear to auscultation without rales, wheezing or rhonchi  ABDOMEN: Soft, non-tender, non-distended MUSCULOSKELETAL:  No edema; No deformity  SKIN: Warm and dry NEUROLOGIC:  Alert and oriented x 3 PSYCHIATRIC:  Normal affect   ASSESSMENT:    1. Pre-op evaluation   2. Coronary artery disease involving native coronary artery of native heart without angina pectoris   3. Primary hypertension     PLAN:    In order of problems listed above:  Preop evaluation, pulmonary biopsy being considered.  Echo shows normal systolic function, patient denies chest pain or shortness of breath.  Blood pressure adequately controlled.  Okay to perform biopsy from a cardiac perspective.  EF is normal, patient is asymptomatic. CAD, RCA and LAD calcifications on chest CT.  Start aspirin 81 mg, Pravachol 20 mg daily.  Obtain Lexiscan Myoview. Hypertension, blood pressure controlled.  Continue HCTZ, Cardizem.  Follow-up in 6 to 8 weeks  Total encounter time 40 minutes  Greater than 50% was spent in counseling and coordination of care with the patient  Shared Decision Making/Informed Consent The risks [chest pain, shortness of breath, cardiac arrhythmias, dizziness, blood pressure fluctuations, myocardial infarction, stroke/transient ischemic attack, nausea, vomiting, allergic reaction, radiation exposure, metallic taste sensation and life-threatening complications (estimated to be 1 in 10,000)], benefits (risk stratification, diagnosing coronary artery disease, treatment guidance) and alternatives of a nuclear stress test were discussed in detail with Jeffrey Torres and he agrees to proceed.   Medication Adjustments/Labs and Tests Ordered: Current medicines are reviewed at length with the patient today.  Concerns regarding medicines are outlined above.  Orders Placed  This Encounter  Procedures   NM Myocar Multi W/Spect W/Wall Motion / EF    Meds ordered this encounter  Medications   pravastatin (PRAVACHOL) 20 MG tablet    Sig: Take 1 tablet (20 mg total) by mouth every evening.  Dispense:  30 tablet    Refill:  3     Patient Instructions  Medication Instructions:   Your physician has recommended you make the following change in your medication:    START taking Pravastatin 20 MG once a day.   *If you need a refill on your cardiac medications before your next appointment, please call your pharmacy*   Testing/Procedures:  [place]     Your caregiver has ordered a Stress Test with nuclear imaging. The purpose of this test is to evaluate the blood supply to your heart muscle. This procedure is referred to as a "Non-Invasive Stress Test." This is because other than having an IV started in your vein, nothing is inserted or "invades" your body. Cardiac stress tests are done to find areas of poor blood flow to the heart by determining the extent of coronary artery disease (CAD). Some patients exercise on a treadmill, which naturally increases the blood flow to your heart, while others who are  unable to walk on a treadmill due to physical limitations have a pharmacologic/chemical stress agent called Lexiscan . This medicine will mimic walking on a treadmill by temporarily increasing your coronary blood flow.      PLEASE REPORT TO [facility] MEDICAL MALL ENTRANCE   THE VOLUNTEERS AT THE FIRST DESK WILL DIRECT YOU WHERE TO GO     *Please note: these test may take anywhere between 2-4 hours to complete       Date of Procedure:_____________________________________   Arrival Time for Procedure:______________________________    PLEASE NOTIFY THE OFFICE AT LEAST 24 HOURS IN ADVANCE IF YOU ARE UNABLE TO KEEP YOUR APPOINTMENT.  [place] 24 HOURS IN ADVANCE IF YOU ARE UNABLE TO KEEP YOUR  APPOINTMENT. [phone]         How to prepare for your Myoview test:    _XX___:  Hold other medications as follows:  hydrochlorothiazide     1. Do not eat or drink after midnight  2. No caffeine for 24 hours prior to test  3. No smoking 24 hours prior to test.  4. Unless instructed otherwise, Take your medication with a small sips of water.    5.         Ladies, please do not wear dresses. Skirts or pants are appropriate. Please wear a short sleeve shirt.  6. No perfume, cologne or lotion.  7. Wear comfortable walking shoes. No heels!     Follow-Up: At [facility], you and your health needs are our priority.  As part of our continuing mission to provide you with exceptional heart care, we have created designated Provider Care Teams.  These Care Teams include your primary Cardiologist (physician) and Advanced Practice Providers (APPs -  Physician Assistants and Nurse Practitioners) who all work together to provide you with the care you need, when you need it.  We recommend signing up for the patient portal called "MyChart".  Sign up information is provided on this After Visit Summary.  MyChart is used to connect with patients for Virtual Visits (Telemedicine).  Patients are able to view lab/test results, encounter notes, upcoming appointments, etc.  Non-urgent messages can be sent to your provider as well.   To learn more about what you can do with MyChart, go to [email or web address].    Your next appointment:   Follow up after testing   The format for your next appointment:   In Person  Provider:   Aaron Edelman  Garen Lah, MD    Other Instructions   Important Information About Sugar         Signed, Kate Sable, MD  04/17/2022 3:51 PM    [place]

## 2022-04-17 NOTE — Patient Instructions (Signed)
Medication Instructions:   Your physician has recommended you make the following change in your medication:    START taking Pravastatin 20 MG once a day.   *If you need a refill on your cardiac medications before your next appointment, please call your pharmacy*   Testing/Procedures:  [place]     Your caregiver has ordered a Stress Test with nuclear imaging. The purpose of this test is to evaluate the blood supply to your heart muscle. This procedure is referred to as a "Non-Invasive Stress Test." This is because other than having an IV started in your vein, nothing is inserted or "invades" your body. Cardiac stress tests are done to find areas of poor blood flow to the heart by determining the extent of coronary artery disease (CAD). Some patients exercise on a treadmill, which naturally increases the blood flow to your heart, while others who are  unable to walk on a treadmill due to physical limitations have a pharmacologic/chemical stress agent called Lexiscan . This medicine will mimic walking on a treadmill by temporarily increasing your coronary blood flow.      PLEASE REPORT TO [facility] MEDICAL MALL ENTRANCE   THE VOLUNTEERS AT THE FIRST DESK WILL DIRECT YOU WHERE TO GO     *Please note: these test may take anywhere between 2-4 hours to complete       Date of Procedure:_____________________________________   Arrival Time for Procedure:______________________________    PLEASE NOTIFY THE OFFICE AT LEAST 24 HOURS IN ADVANCE IF YOU ARE UNABLE TO KEEP YOUR APPOINTMENT.  [place] 24 HOURS IN ADVANCE IF YOU ARE UNABLE TO KEEP YOUR APPOINTMENT. [phone]         How to prepare for your Myoview test:    _XX___:  Hold other medications as follows:  hydrochlorothiazide     1. Do not eat or drink after midnight  2. No caffeine for 24 hours prior to test  3. No smoking 24 hours prior to test.  4. Unless instructed  otherwise, Take your medication with a small sips of water.    5.         Ladies, please do not wear dresses. Skirts or pants are appropriate. Please wear a short sleeve shirt.  6. No perfume, cologne or lotion.  7. Wear comfortable walking shoes. No heels!     Follow-Up: At [facility], you and your health needs are our priority.  As part of our continuing mission to provide you with exceptional heart care, we have created designated Provider Care Teams.  These Care Teams include your primary Cardiologist (physician) and Advanced Practice Providers (APPs -  Physician Assistants and Nurse Practitioners) who all work together to provide you with the care you need, when you need it.  We recommend signing up for the patient portal called "MyChart".  Sign up information is provided on this After Visit Summary.  MyChart is used to connect with patients for Virtual Visits (Telemedicine).  Patients are able to view lab/test results, encounter notes, upcoming appointments, etc.  Non-urgent messages can be sent to your provider as well.   To learn more about what you can do with MyChart, go to [email or web address].    Your next appointment:   Follow up after testing   The format for your next appointment:   In Person  Provider:   Kate Sable, MD    Other Instructions   Important Information About Sugar

## 2022-04-20 DIAGNOSIS — H353221 Exudative age-related macular degeneration, left eye, with active choroidal neovascularization: Secondary | ICD-10-CM | POA: Diagnosis not present

## 2022-04-20 NOTE — Addendum Note (Signed)
Addended by: Nestor Ramp on: 04/20/2022 12:52 PM   Modules accepted: Orders

## 2022-04-21 ENCOUNTER — Encounter: Payer: Self-pay | Admitting: Pulmonary Disease

## 2022-04-21 ENCOUNTER — Telehealth: Payer: Self-pay | Admitting: Pulmonary Disease

## 2022-04-21 ENCOUNTER — Ambulatory Visit (INDEPENDENT_AMBULATORY_CARE_PROVIDER_SITE_OTHER): Payer: Medicare HMO | Admitting: Pulmonary Disease

## 2022-04-21 VITALS — BP 128/78 | HR 61 | Temp 97.5°F | Ht 70.0 in | Wt 194.4 lb

## 2022-04-21 DIAGNOSIS — Z8521 Personal history of malignant neoplasm of larynx: Secondary | ICD-10-CM

## 2022-04-21 DIAGNOSIS — R062 Wheezing: Secondary | ICD-10-CM

## 2022-04-21 DIAGNOSIS — R911 Solitary pulmonary nodule: Secondary | ICD-10-CM

## 2022-04-21 DIAGNOSIS — F17210 Nicotine dependence, cigarettes, uncomplicated: Secondary | ICD-10-CM

## 2022-04-21 DIAGNOSIS — R0602 Shortness of breath: Secondary | ICD-10-CM

## 2022-04-21 DIAGNOSIS — R69 Illness, unspecified: Secondary | ICD-10-CM | POA: Diagnosis not present

## 2022-04-21 NOTE — Telephone Encounter (Signed)
Scheduled for [facility] on 04/27/2022. DX: Lung Nodule CPT: 26333

## 2022-04-21 NOTE — Progress Notes (Addendum)
Subjective:    Patient ID: Jeffrey Torres, male    DOB: April 30, 1946, 76 y.o.   MRN: [id number] Patient Care Team: Birdie Sons, MD as PCP - General (Family Medicine) Kate Sable, MD as PCP - Cardiology (Cardiology) Birder Robson, MD as Referring Physician (Ophthalmology) Carloyn Manner, MD as Referring Physician (Otolaryngology) Noreene Filbert, MD as Referring Physician (Radiation Oncology) Isaias Sakai, MD as Referring Physician (Ophthalmology) Telford Nab, RN as Oncology Nurse Navigator  Chief Complaint  Patient presents with   Consult    CT Chest- 03/15/2022 and PET scan 04/08/2022. No SOB. Occasional dry cough and wheezing.    HPI Patient is a 76 year old current smoker (0.75 PPD/50 PY) who presents for evaluation of a right upper lobe nodule noted during admission at [facility] on 14 March 2022.  He is kindly referred by Dr. Delight Hoh.  Patient previously was diagnosed with laryngeal cancer stage I squamous cell in 2019 and was treated with XRT only.  Patient had a recent ENT endoscopy which showed no evidence of recurrence of disease.  Patient underwent CT chest during evaluation for issues with erratic blood pressures and dizziness.  CT chest showed a 2 cm noncalcified nodule in the right upper lobe there were other smaller nodules on the right upper lobe surrounding this lesion.  Sequently, the patient had a PET/CT which showed that this lesion was hypermetabolic and that the satellite 8 mm lesions were also hypermetabolic.  There was no evidence of mediastinal adenopathy or hypermetabolic activity.  We are asked to evaluate the patient for biopsy of this lesion.  The patient does not endorse any fevers, chills or sweats.  No hemoptysis.  No shortness of breath.  He notices occasional wheezing and dry cough however these resolved spontaneously and he does not feel that he has great symptom burden.  He has not had any weight loss or anorexia.  He has not had any  chest pain, he occasionally has tachypalpitations.  She had cardiac work-up during his admission to the hospital, echocardiogram showed normal systolic function with EF of 60 to 65% CT chest showed LAD and RCA calcifications.  Dr. Kate Sable evaluated the patient from a cardiac standpoint on 20 October and deemed that the patient is a low risk for proposed procedure of bronchoscopy under general anesthesia.  Patient has a past history of sleep apnea diagnosed in August 2021, however he has never worn CPAP.  I reviewed the available ridging with the patient.  Also explained the diagnostic alternatives.  Review of Systems A 10 point review of systems was performed and it is as noted above otherwise negative.  Past Medical History:  Diagnosis Date   Acute metabolic encephalopathy 08/02/4008   Arthritis    Cancer ([place])    larynx ca, surgery and radiation   GERD (gastroesophageal reflux disease)    Heart murmur    Hernia, inguinal, bilateral    History of chicken pox    History of kidney stones    History of measles    History of mumps    History of shingles    HOH (hard of hearing)    Macular degeneration of both eyes    Motion sickness    ocean boat   Pneumonia due to COVID-19 virus 07/31/2020   PVC (premature ventricular contraction)    Past Surgical History:  Procedure Laterality Date   CARDIAC CATHETERIZATION  2010   normal per patient report   CATARACT EXTRACTION W/PHACO Right 07/18/2019   Procedure:  CATARACT EXTRACTION PHACO AND INTRAOCULAR LENS PLACEMENT (IOC) RIGHT 4.54 00:29.7;  Surgeon: Birder Robson, MD;  Location: [facility];  Service: Ophthalmology;  Laterality: Right;   CATARACT EXTRACTION W/PHACO Left 08/08/2019   Procedure: CATARACT EXTRACTION PHACO AND INTRAOCULAR LENS PLACEMENT ([place]) LEFT;  Surgeon: Birder Robson, MD;  Location: [facility]S;  Service: Ophthalmology;  Laterality: Left;  Lot #[id number] H [place]: 00:34.5 CDE: 4.75   COLONOSCOPY  06/24/2006    Dr. Bary Castilla. Multiple benign appearing 82mm polyps in the cecum and in the rectum. -Three 11mm polyps in the transversed colon, in the tranverse colon, proximal and in the distal transverse colon. Resected and retrieved.   COLONOSCOPY WITH PROPOFOL N/A 03/12/2020   Procedure: COLONOSCOPY WITH PROPOFOL;  Surgeon: Lucilla Lame, MD;  Location: [facility] ENDOSCOPY;  Service: Endoscopy;  Laterality: N/A;   CYSTOSCOPY WITH HOLMIUM LASER LITHOTRIPSY  2001   kidney stones removed, [facility]   LARYNGOSCOPY Right 04/01/2017   Procedure: SUSPENSION LARYNGOSCOPY WITH MICROFLAP EXCISION;  Surgeon: Carloyn Manner, MD;  Location: [facility] ORS;  Service: ENT;  Laterality: Right;   sinus surgery   2002   ; removal of polyps   TONSILLECTOMY  1954   Tubular adenoma removed  06/24/2006   Patient Active Problem List   Diagnosis Date Noted   Right upper lobe pulmonary nodule 03/20/2022   Dizziness 03/14/2022   Hypokalemia 03/14/2022   Hypernatremia 07/31/2020   History of colonic polyps    Severe obstructive sleep apnea 02/21/2020   Primary hypertension 08/14/2019   Atrial bigeminy 08/14/2019   History of laryngeal cancer 12/28/2017   History of adenomatous polyp of colon 10/30/2015   Inguinal hernia, bilateral 10/21/2015   PVC (premature ventricular contraction) 10/21/2015   Tobacco dependence 29/52/8413   Umbilical hernia 24/40/1027   Family History  Problem Relation Age of Onset   Lung cancer Mother    Heart disease Father    Social History   Tobacco Use   Smoking status: Every Day    Packs/day: 1.00    Types: Cigarettes    Last attempt to quit: 12/2017    Years since quitting: 4.3   Smokeless tobacco: Never   Tobacco comments:    started age 36 yo.previously smoked carton a week    3/4 of a pack a day. 04/21/2022 khj  Substance Use Topics   Alcohol use: Not Currently    Alcohol/week: 0.0 standard drinks of alcohol   Allergies  Allergen Reactions   Penicillins Anaphylaxis and Swelling     Did it involve swelling of the face/tongue/throat, SOB, or low BP? Yes Did it involve sudden or severe rash/hives, skin peeling, or any reaction on the inside of your mouth or nose? No Did you need to seek medical attention at a hospital or doctor's office? No When did it last happen?      10 + years ago If all above answers are "NO", may proceed with cephalosporin use.    Current Meds  Medication Sig   aspirin 81 MG tablet Take 81 mg by mouth daily.   diltiazem (CARDIZEM CD) 120 MG 24 hr capsule Take 1 capsule (120 mg total) by mouth daily.   fluticasone (FLONASE) 50 MCG/ACT nasal spray Place 2 sprays into both nostrils every morning.    hydrochlorothiazide (HYDRODIURIL) 25 MG tablet Take 1 tablet (25 mg total) by mouth daily.   KLOR-CON M20 20 MEQ tablet TAKE 1 TABLET BY MOUTH EVERY DAY   Multiple Vitamins-Minerals (PRESERVISION AREDS 2 PO) Take 2 capsules by mouth  every morning.    omeprazole (PRILOSEC) 20 MG capsule Take 20 mg by mouth daily before breakfast.   pravastatin (PRAVACHOL) 20 MG tablet Take 1 tablet (20 mg total) by mouth every evening.   sertraline (ZOLOFT) 100 MG tablet Take 1 tablet (100 mg total) by mouth daily.   sildenafil (VIAGRA) 50 MG tablet Take 1 tablet (50 mg total) by mouth daily as needed for erectile dysfunction.   Immunization History  Administered Date(s) Administered   Fluad Quad(high Dose 65+) 08/07/2020, 03/18/2021, 03/17/2022   Influenza, High Dose Seasonal PF 03/25/2017, 03/29/2018, 04/24/2019   Pneumococcal Conjugate-13 04/26/2014   Pneumococcal Polysaccharide-23 05/28/2012       Objective:   Physical Exam BP 128/78 (BP Location: Left Arm, Cuff Size: Normal)   Pulse 61   Temp (!) 97.5 F (36.4 C)   Ht 5\' 10"  (1.778 m)   Wt 194 lb 6.4 oz (88.2 kg)   SpO2 96%   BMI 27.89 kg/m  GENERAL: Well-developed, overweight gentleman in no acute distress, fully ambulatory, no conversational dyspnea.  Fluent speech. HEAD: Normocephalic, atraumatic.   EYES: Pupils equal, round, reactive to light.  No scleral icterus.  MOUTH: Oral mucosa moist.  No thrush. NECK: Supple. No thyromegaly. Trachea midline. No JVD.  No adenopathy. PULMONARY: Good air entry bilaterally.  No adventitious sounds. CARDIOVASCULAR: S1 and S2. Regular rate and rhythm, occasional extrasystoles.  No rubs, murmurs or gallops heard. ABDOMEN: Benign. MUSCULOSKELETAL: No joint deformity, no clubbing, no edema.  NEUROLOGIC: No overt focal deficit, no gait disturbance, speech is fluent. SKIN: Intact,warm,dry. PSYCH: Mood and behavior normal  Imaging, independently reviewed: Representative images from the CT chest performed 15 March 2022 showing a 2 x 1.7 cm nodule in the right upper lobe with 2-3 8 mm satellite nodules surrounding it:   Representative image from the head CT obtained 08 April 2022 shows hypermetabolic activity on the right upper lobe nodule:      Office Spirometry Results: FEV1 2.76 L or 90% predicted, FVC 3.1 L or 81% predicted, FEV1/FVC 81%, overall normal spirometry.  Assessment & Plan:     ICD-10-CM   1. Right upper lobe pulmonary nodule 2 x 1.7 cm  R91.1 CT SUPER D CHEST WO MONARCH PILOT   Hypermetabolic Carcinoma until proven otherwise Robotic assisted navigational bronchoscopy    2. Wheezing  R06.2 Spirometry with Graph   Spirometry today was normal Will need full PFTs    3. History of laryngeal cancer  Z85.21    Diagnosed 2019, treated with XRT No evidence of recurrence per recent ENT endoscopy    4. Tobacco dependence due to cigarettes  F17.210    Patient was counseled regards to discontinuation of smoking Total counseling time 3 to 5 minutes Patient precontemplative with regards to discontinuation     Orders Placed This Encounter  Procedures   CT SUPER D CHEST WO MONARCH PILOT    Prior to [place] on 04/27/2022    Standing Status:   Future    Standing Expiration Date:   04/22/2023    Order Specific Question:   Preferred  imaging location?    Answer:   [facility]   Spirometry with Graph    Order Specific Question:   Where should this test be performed?    Answer:   [facility]   Patient was provided information on diagnostic alternatives for right upper lobe nodule.  He likes to go ahead with robotic assisted navigational bronchoscopy.  Patient understands the potential benefits, limitations and complications  of the procedure.  He agrees to proceed.  I advised the patient that from preliminary assessment he may be a candidate for resection however, it was stressed to him that he needs to quit smoking.  Patient understands and is precontemplative regards to discontinuation of smoking.  For the purposes of the procedure ventilatory strategy will be as follows: Tidal volume 580, PEEP 8-10, FiO2 40 to 70%.  [name], MD Advanced Bronchoscopy PCCM [facility]    *This note was dictated using voice recognition software/Dragon.  Despite best efforts to proofread, errors can occur which can change the meaning. Any transcriptional errors that result from this process are unintentional and may not be fully corrected at the time of dictation.

## 2022-04-21 NOTE — H&P (View-Only) (Signed)
Subjective:    Patient ID: Jeffrey Torres, male    DOB: 09-Feb-1946, 76 y.o.   MRN: [id number] Patient Care Team: Birdie Sons, MD as PCP - General (Family Medicine) Kate Sable, MD as PCP - Cardiology (Cardiology) Birder Robson, MD as Referring Physician (Ophthalmology) Carloyn Manner, MD as Referring Physician (Otolaryngology) Noreene Filbert, MD as Referring Physician (Radiation Oncology) Isaias Sakai, MD as Referring Physician (Ophthalmology) Telford Nab, RN as Oncology Nurse Navigator  Chief Complaint  Patient presents with   Consult    CT Chest- 03/15/2022 and PET scan 04/08/2022. No SOB. Occasional dry cough and wheezing.    HPI Patient is a 76 year old non-smoker (0.75 PPD/50 PY) who presents for evaluation of a right upper lobe nodule noted during admission at [facility] on 14 March 2022.  He is kindly referred by Dr. Delight Hoh.  Patient previously was diagnosed with laryngeal cancer stage I squamous cell in 2019 and was treated with XRT only.  Patient had a recent ENT endoscopy which showed no evidence of recurrence of disease.  Patient underwent CT chest during evaluation for issues with erratic blood pressures and dizziness.  CT chest showed a 2 cm noncalcified nodule in the right upper lobe there were other smaller nodules on the right upper lobe surrounding this lesion.  Sequently, the patient had a PET/CT which showed that this lesion was hypermetabolic and that the satellite 8 mm lesions were also hypermetabolic.  There was no evidence of mediastinal adenopathy or hypermetabolic activity.  We are asked to evaluate the patient for biopsy of this lesion.  The patient does not endorse any fevers, chills or sweats.  No hemoptysis.  No shortness of breath.  He notices occasional wheezing and dry cough however these resolved spontaneously and he does not feel that he has great symptom burden.  He has not had any weight loss or anorexia.  He has not had any chest  pain, he occasionally has tachypalpitations.  She had cardiac work-up during his admission to the hospital, echocardiogram showed normal systolic function with EF of 60 to 65% CT chest showed LAD and RCA calcifications.  Dr. Kate Sable evaluated the patient from a cardiac standpoint on 20 October and deemed that the patient is a low risk for proposed procedure of bronchoscopy under general anesthesia.  Patient has a past history of sleep apnea diagnosed in August 2021, however he has never worn CPAP.  I reviewed the available ridging with the patient.  Also explained the diagnostic alternatives.  Review of Systems A 10 point review of systems was performed and it is as noted above otherwise negative.  Past Medical History:  Diagnosis Date   Acute metabolic encephalopathy 08/03/8525   Arthritis    Cancer ([place])    larynx ca, surgery and radiation   GERD (gastroesophageal reflux disease)    Heart murmur    Hernia, inguinal, bilateral    History of chicken pox    History of kidney stones    History of measles    History of mumps    History of shingles    HOH (hard of hearing)    Macular degeneration of both eyes    Motion sickness    ocean boat   Pneumonia due to COVID-19 virus 07/31/2020   PVC (premature ventricular contraction)    Past Surgical History:  Procedure Laterality Date   CARDIAC CATHETERIZATION  2010   normal per patient report   CATARACT EXTRACTION W/PHACO Right 07/18/2019   Procedure: CATARACT  EXTRACTION PHACO AND INTRAOCULAR LENS PLACEMENT (IOC) RIGHT 4.54 00:29.7;  Surgeon: Birder Robson, MD;  Location: [facility];  Service: Ophthalmology;  Laterality: Right;   CATARACT EXTRACTION W/PHACO Left 08/08/2019   Procedure: CATARACT EXTRACTION PHACO AND INTRAOCULAR LENS PLACEMENT ([place]) LEFT;  Surgeon: Birder Robson, MD;  Location: [facility] ORS;  Service: Ophthalmology;  Laterality: Left;  Lot #[id number] H [place]: 00:34.5 CDE: 4.75   COLONOSCOPY  06/24/2006   Dr.  Bary Castilla. Multiple benign appearing 46mm polyps in the cecum and in the rectum. -Three 28mm polyps in the transversed colon, in the tranverse colon, proximal and in the distal transverse colon. Resected and retrieved.   COLONOSCOPY WITH PROPOFOL N/A 03/12/2020   Procedure: COLONOSCOPY WITH PROPOFOL;  Surgeon: Lucilla Lame, MD;  Location: [facility] ENDOSCOPY;  Service: Endoscopy;  Laterality: N/A;   CYSTOSCOPY WITH HOLMIUM LASER LITHOTRIPSY  2001   kidney stones removed, [facility]   LARYNGOSCOPY Right 04/01/2017   Procedure: SUSPENSION LARYNGOSCOPY WITH MICROFLAP EXCISION;  Surgeon: Carloyn Manner, MD;  Location: [facility] ORS;  Service: ENT;  Laterality: Right;   sinus surgery   2002   [place]; removal of polyps   TONSILLECTOMY  1954   Tubular adenoma removed  06/24/2006   Patient Active Problem List   Diagnosis Date Noted   Right upper lobe pulmonary nodule 03/20/2022   Dizziness 03/14/2022   Hypokalemia 03/14/2022   Hypernatremia 07/31/2020   History of colonic polyps    Severe obstructive sleep apnea 02/21/2020   Primary hypertension 08/14/2019   Atrial bigeminy 08/14/2019   History of laryngeal cancer 12/28/2017   History of adenomatous polyp of colon 10/30/2015   Inguinal hernia, bilateral 10/21/2015   PVC (premature ventricular contraction) 10/21/2015   Tobacco dependence 25/10/3974   Umbilical hernia 73/41/9379   Family History  Problem Relation Age of Onset   Lung cancer Mother    Heart disease Father    Social History   Tobacco Use   Smoking status: Every Day    Packs/day: 1.00    Types: Cigarettes    Last attempt to quit: 12/2017    Years since quitting: 4.3   Smokeless tobacco: Never   Tobacco comments:    started age 38 yo.previously smoked carton a week    3/4 of a pack a day. 04/21/2022 khj  Substance Use Topics   Alcohol use: Not Currently    Alcohol/week: 0.0 standard drinks of alcohol   Allergies  Allergen Reactions   Penicillins Anaphylaxis and Swelling    Did  it involve swelling of the face/tongue/throat, SOB, or low BP? Yes Did it involve sudden or severe rash/hives, skin peeling, or any reaction on the inside of your mouth or nose? No Did you need to seek medical attention at a hospital or doctor's office? No When did it last happen?      10 + years ago If all above answers are "NO", may proceed with cephalosporin use.    Current Meds  Medication Sig   aspirin 81 MG tablet Take 81 mg by mouth daily.   diltiazem (CARDIZEM CD) 120 MG 24 hr capsule Take 1 capsule (120 mg total) by mouth daily.   fluticasone (FLONASE) 50 MCG/ACT nasal spray Place 2 sprays into both nostrils every morning.    hydrochlorothiazide (HYDRODIURIL) 25 MG tablet Take 1 tablet (25 mg total) by mouth daily.   KLOR-CON M20 20 MEQ tablet TAKE 1 TABLET BY MOUTH EVERY DAY   Multiple Vitamins-Minerals (PRESERVISION AREDS 2 PO) Take 2 capsules by mouth every  morning.    omeprazole (PRILOSEC) 20 MG capsule Take 20 mg by mouth daily before breakfast.   pravastatin (PRAVACHOL) 20 MG tablet Take 1 tablet (20 mg total) by mouth every evening.   sertraline (ZOLOFT) 100 MG tablet Take 1 tablet (100 mg total) by mouth daily.   sildenafil (VIAGRA) 50 MG tablet Take 1 tablet (50 mg total) by mouth daily as needed for erectile dysfunction.   Immunization History  Administered Date(s) Administered   Fluad Quad(high Dose 65+) 08/07/2020, 03/18/2021, 03/17/2022   Influenza, High Dose Seasonal PF 03/25/2017, 03/29/2018, 04/24/2019   Pneumococcal Conjugate-13 04/26/2014   Pneumococcal Polysaccharide-23 05/28/2012       Objective:   Physical Exam BP 128/78 (BP Location: Left Arm, Cuff Size: Normal)   Pulse 61   Temp (!) 97.5 F (36.4 C)   Ht 5\' 10"  (1.778 m)   Wt 194 lb 6.4 oz (88.2 kg)   SpO2 96%   BMI 27.89 kg/m  GENERAL: Well-developed, overweight gentleman in no acute distress, fully ambulatory, no conversational dyspnea.  Fluent speech. HEAD: Normocephalic, atraumatic.  EYES:  Pupils equal, round, reactive to light.  No scleral icterus.  MOUTH: Oral mucosa moist.  No thrush. NECK: Supple. No thyromegaly. Trachea midline. No JVD.  No adenopathy. PULMONARY: Good air entry bilaterally.  No adventitious sounds. CARDIOVASCULAR: S1 and S2. Regular rate and rhythm, occasional extrasystoles.  No rubs, murmurs or gallops heard. ABDOMEN: Benign. MUSCULOSKELETAL: No joint deformity, no clubbing, no edema.  NEUROLOGIC: No overt focal deficit, no gait disturbance, speech is fluent. SKIN: Intact,warm,dry. PSYCH: Mood and behavior normal  Imaging, independently reviewed: Representative images from the CT chest performed 15 March 2022 showing a 2 x 1.7 cm nodule in the right upper lobe with 2-3 8 mm satellite nodules surrounding it:   Representative image from the head CT obtained 08 April 2022 shows hypermetabolic activity on the right upper lobe nodule:      Office Spirometry Results: FEV1 2.76 L or 90% predicted, FVC 3.1 L or 81% predicted, FEV1/FVC 81%, overall normal spirometry.  Assessment & Plan:     ICD-10-CM   1. Right upper lobe pulmonary nodule 2 x 1.7 cm  R91.1 CT SUPER D CHEST WO [facility]ARCH PILOT   Hypermetabolic Carcinoma until proven otherwise Robotic assisted navigational bronchoscopy    2. Wheezing  R06.2 Spirometry with Graph   Spirometry today was normal Will need full PFTs    3. History of laryngeal cancer  Z85.21    Diagnosed 2019, treated with XRT No evidence of recurrence per recent ENT endoscopy    4. Tobacco dependence due to cigarettes  F17.210    Patient was counseled regards to discontinuation of smoking Total counseling time 3 to 5 minutes Patient precontemplative with regards to discontinuation     Orders Placed This Encounter  Procedures   CT SUPER D CHEST WO MONARCH PILOT    Prior to [place] on 04/27/2022    Standing Status:   Future    Standing Expiration Date:   04/22/2023    Order Specific Question:   Preferred imaging  location?    Answer:   [facility]   Spirometry with Graph    Order Specific Question:   Where should this test be performed?    Answer:   [facility]   Patient was provided information on diagnostic alternatives for right upper lobe nodule.  He likes to go ahead with robotic assisted navigational bronchoscopy.  Patient understands the potential benefits, limitations and complications of  the procedure.  He agrees to proceed.  I advised the patient that from preliminary assessment he may be a candidate for resection however, it was stressed to him that he needs to quit smoking.  Patient understands and is precontemplative regards to discontinuation of smoking.  For the purposes of the procedure ventilatory strategy will be as follows: Tidal volume 580, PEEP 8-10, FiO2 40 to 70%.  [name], MD Advanced Bronchoscopy PCCM [facility]    *This note was dictated using voice recognition software/Dragon.  Despite best efforts to proofread, errors can occur which can change the meaning. Any transcriptional errors that result from this process are unintentional and may not be fully corrected at the time of dictation.

## 2022-04-21 NOTE — Patient Instructions (Signed)
We have schedule you for your procedure on 30 October at 12:30 PM.  You must have someone bring him that day as there will be a same-day surgery procedure and you will require general anesthesia.  You will not be allowed to drive after the procedure.  We are scheduling a another scan of the chest that will allow [place] to draw the map to get to the spot in your lungs so that we can biopsy it.  This is another CT scan and it is necessary to map out the path to your spot in the lung.  We will see you in follow-up in 4 to 6 weeks time but we will stay in contact with you, Dr. [name] and Dr. Baruch Gouty after the procedure.

## 2022-04-22 ENCOUNTER — Ambulatory Visit (INDEPENDENT_AMBULATORY_CARE_PROVIDER_SITE_OTHER): Payer: Medicare HMO | Admitting: Family Medicine

## 2022-04-22 ENCOUNTER — Encounter: Payer: Self-pay | Admitting: Pulmonary Disease

## 2022-04-22 ENCOUNTER — Encounter: Payer: Self-pay | Admitting: Family Medicine

## 2022-04-22 ENCOUNTER — Other Ambulatory Visit: Payer: Self-pay | Admitting: Family Medicine

## 2022-04-22 VITALS — BP 117/62 | HR 68 | Temp 97.9°F | Resp 16 | Wt 196.0 lb

## 2022-04-22 DIAGNOSIS — F17210 Nicotine dependence, cigarettes, uncomplicated: Secondary | ICD-10-CM

## 2022-04-22 DIAGNOSIS — G4709 Other insomnia: Secondary | ICD-10-CM | POA: Diagnosis not present

## 2022-04-22 DIAGNOSIS — F32A Depression, unspecified: Secondary | ICD-10-CM

## 2022-04-22 DIAGNOSIS — F418 Other specified anxiety disorders: Secondary | ICD-10-CM | POA: Insufficient documentation

## 2022-04-22 DIAGNOSIS — N529 Male erectile dysfunction, unspecified: Secondary | ICD-10-CM

## 2022-04-22 DIAGNOSIS — R69 Illness, unspecified: Secondary | ICD-10-CM | POA: Diagnosis not present

## 2022-04-22 MED ORDER — RAMELTEON 8 MG PO TABS: 8.0000 mg | ORAL_TABLET | Freq: Every day | ORAL | 2 refills | Status: DC

## 2022-04-22 MED ORDER — BUPROPION HCL ER (SR) 150 MG PO TB12: ORAL_TABLET | ORAL | 3 refills | Status: DC

## 2022-04-22 MED ORDER — SILDENAFIL CITRATE 50 MG PO TABS: 50.0000 mg | ORAL_TABLET | Freq: Every day | ORAL | 2 refills | Status: DC | PRN

## 2022-04-22 NOTE — Telephone Encounter (Signed)
Pre Admit- 04/23/2022 between 1-5pm Covid- 04/24/2022 9am  I left a message for the patient to return my call.

## 2022-04-22 NOTE — Patient Instructions (Addendum)
Please review the attached list of medications and notify my office if there are any errors.   I sent a prescription for the sleep medication to [facility]. If your insurance doesn't pay for the sleeping medication (ramelteon), then take the printed prescription to [facility] with your GoodRx card and it should only cost about 30 dollars.

## 2022-04-22 NOTE — Progress Notes (Signed)
I,Roshena L Chambers,acting as a scribe for Lelon Huh, MD.,have documented all relevant documentation on the behalf of Lelon Huh, MD,as directed by  Lelon Huh, MD while in the presence of Lelon Huh, MD.    Established patient visit   Patient: Jeffrey Torres   DOB: 24-Aug-1945   76 y.o. Male  MRN: [id number] Visit Date: 04/22/2022  Today's healthcare provider: Lelon Huh, MD   Chief Complaint  Patient presents with   Depression   Insomnia   Subjective    HPI  Depression, Follow-up  He  was last seen for this 2 months ago. Changes made at last visit include none; continue sertraline.   He reports good compliance with treatment. He is not having side effects.   He reports good tolerance of treatment. Current symptoms include: depressed mood He feels he is Unchanged since last visit.     12/31/2021    8:21 AM 11/18/2021    9:13 AM 11/18/2021    9:12 AM  Depression screen PHQ 2/9  Decreased Interest 1 2 2   Down, Depressed, Hopeless 2 3 3   PHQ - 2 Score 3 5 5   Altered sleeping 2 2 2   Tired, decreased energy 2 2 2   Change in appetite 0 1 1  Feeling bad or failure about yourself  0 2 2  Trouble concentrating 1 3 3   Moving slowly or fidgety/restless 2 1 1   Suicidal thoughts 0 1 1  PHQ-9 Score 10 17 17   Difficult doing work/chores Somewhat difficult Somewhat difficult Somewhat difficult    -----------------------------------------------------------------------------------------   Follow up for insomnia:  The patient was last seen for this 2 months ago. Changes made at last visit include trying traZODone (DESYREL) 100 MG tablet; Take 1 tablet (100 mg total) by mouth at bedtime. Take one table at bedtime. May take another 1/2 tablet overnight if needed for sleep  .  He reports poor compliance with treatment. He feels that condition is Unchanged.  He is having side effects. Patient stopped taking Trazodone due to it causing blurred vision and 2  episodes of syncope.  -----------------------------------------------------------------------------------------   Medications: Outpatient Medications Prior to Visit  Medication Sig   aspirin 81 MG tablet Take 81 mg by mouth daily.   diltiazem (CARDIZEM CD) 120 MG 24 hr capsule Take 1 capsule (120 mg total) by mouth daily.   fluticasone (FLONASE) 50 MCG/ACT nasal spray Place 2 sprays into both nostrils every morning.    hydrochlorothiazide (HYDRODIURIL) 25 MG tablet Take 1 tablet (25 mg total) by mouth daily.   KLOR-CON M20 20 MEQ tablet TAKE 1 TABLET BY MOUTH EVERY DAY   Multiple Vitamins-Minerals (PRESERVISION AREDS 2 PO) Take 2 capsules by mouth every morning.    omeprazole (PRILOSEC) 20 MG capsule Take 20 mg by mouth daily before breakfast.   pravastatin (PRAVACHOL) 20 MG tablet Take 1 tablet (20 mg total) by mouth every evening.   sertraline (ZOLOFT) 100 MG tablet Take 1 tablet (100 mg total) by mouth daily.   sildenafil (VIAGRA) 50 MG tablet Take 1 tablet (50 mg total) by mouth daily as needed for erectile dysfunction.   tamsulosin (FLOMAX) 0.4 MG CAPS capsule Take 1 capsule (0.4 mg total) by mouth daily after supper.   traZODone (DESYREL) 100 MG tablet Take 1 tablet (100 mg total) by mouth at bedtime. Take one table at bedtime. May take another 1/2 tablet overnight if needed for sleep (Patient not taking: Reported on 04/22/2022)   No facility-administered medications prior  to visit.    Review of Systems  Constitutional:  Negative for appetite change, chills and fever.  Eyes:  Positive for visual disturbance.  Respiratory:  Negative for chest tightness, shortness of breath and wheezing.   Cardiovascular:  Negative for chest pain and palpitations.  Gastrointestinal:  Negative for abdominal pain, nausea and vomiting.  Neurological:  Positive for syncope.       Objective    BP 117/62 (BP Location: Right Arm, Patient Position: Sitting, Cuff Size: Large)   Pulse 68   Temp 97.9 F  (36.6 C) (Oral)   Resp 16   Wt 196 lb (88.9 kg)   SpO2 99% Comment: room air  BMI 28.12 kg/m   Today's Vitals   04/22/22 1011 04/22/22 1015  BP: (!) 118/47 117/62  Pulse: 61 68  Resp: 16   Temp: 97.9 F (36.6 C)   TempSrc: Oral   SpO2: 99%   Weight: 196 lb (88.9 kg)    Body mass index is 28.12 kg/m.    Physical Exam  General appearance: Well developed, well nourished male, cooperative and in no acute distress Head: Normocephalic, without obvious abnormality, atraumatic Respiratory: Respirations even and unlabored, normal respiratory rate Extremities: All extremities are intact.  Skin: Skin color, texture, turgor normal. No rashes seen  Psych: Appropriate mood and affect. Neurologic: Mental status: Alert, oriented to person, place, and time, thought content appropriate.   Assessment & Plan     1. Depression, unspecified depression type Tolerating sertraline well, but still with some depression concerning health issues. May also benefit from addition of bupropion which he wants to start back on for smoking cessation.   2. Other insomnia Intolerant to trazodone. Will try - ramelteon (ROZEREM) 8 MG tablet; Take 1 tablet (8 mg total) by mouth at bedtime.  Dispense: 30 tablet; Refill: 2   If not covered by insurance it looks to be about #30 at [facility] with GoodRx card.   3. Erectile dysfunction, unspecified erectile dysfunction type refill - sildenafil (VIAGRA) 50 MG tablet; Take 1 tablet (50 mg total) by mouth daily as needed for erectile dysfunction.  Dispense: 30 tablet; Refill: 2  4. Smoking greater than 30 pack years Start back on  buPROPion (WELLBUTRIN SR) 150 MG 12 hr tablet; 1 tablet daily for 3 days, then 1 tablet twice daily. Stop smoking 14 days after starting medication  Dispense: 60 tablet; Refill: 3 He reports this was fairly effective in the past.       The entirety of the information documented in the History of Present Illness, Review of Systems and  Physical Exam were personally obtained by me. Portions of this information were initially documented by the CMA and reviewed by me for thoroughness and accuracy.     Lelon Huh, MD  [facility] [phone] (phone) [phone] (fax)  [place]

## 2022-04-22 NOTE — Telephone Encounter (Signed)
I have notified the patient of his appointments. Nothing further needed.

## 2022-04-22 NOTE — Telephone Encounter (Signed)
For the code [id number] Prior Auth Not Required Refer # Angela Nevin [id number]

## 2022-04-23 ENCOUNTER — Encounter
Admission: RE | Admit: 2022-04-23 | Discharge: 2022-04-23 | Disposition: A | Discharge status: Home / Self Care | Payer: Medicare HMO | Source: Ambulatory Visit | Attending: Cardiology | Admitting: Cardiology

## 2022-04-23 ENCOUNTER — Other Ambulatory Visit: Payer: Self-pay

## 2022-04-23 ENCOUNTER — Encounter (HOSPITAL_BASED_OUTPATIENT_CLINIC_OR_DEPARTMENT_OTHER)
Admission: RE | Admit: 2022-04-23 | Discharge: 2022-04-23 | Disposition: A | Discharge status: Home / Self Care | Payer: Medicare HMO | Source: Ambulatory Visit | Attending: Cardiology | Admitting: Cardiology

## 2022-04-23 DIAGNOSIS — I2510 Atherosclerotic heart disease of native coronary artery without angina pectoris: Secondary | ICD-10-CM | POA: Diagnosis not present

## 2022-04-23 DIAGNOSIS — Z01818 Encounter for other preprocedural examination: Secondary | ICD-10-CM | POA: Diagnosis not present

## 2022-04-23 HISTORY — DX: Unspecified atrial fibrillation: I48.91

## 2022-04-23 HISTORY — DX: Anxiety disorder, unspecified: F41.9

## 2022-04-23 LAB — NM MYOCAR MULTI W/SPECT W/WALL MOTION / EF
LV dias vol: 100 mL (ref 62–150)
LV sys vol: 31 mL
Nuc Stress EF: 69 %
Peak HR: 86 {beats}/min
Rest HR: 47 {beats}/min
Rest Nuclear Isotope Dose: 10.3 mCi
SDS: 1
SRS: 0
SSS: 1
ST Depression (mm): 0 mm
Stress Nuclear Isotope Dose: 31.5 mCi
TID: 1

## 2022-04-23 MED ORDER — TECHNETIUM TC 99M TETROFOSMIN IV KIT
10.2700 | PACK | Freq: Once | INTRAVENOUS | Status: AC | PRN
  Administered 2022-04-23: 10.27 via INTRAVENOUS

## 2022-04-23 MED ORDER — REGADENOSON 0.4 MG/5ML IV SOLN
0.4000 mg | Freq: Once | INTRAVENOUS | Status: AC
  Administered 2022-04-23: 0.4 mg via INTRAVENOUS

## 2022-04-23 MED ORDER — TECHNETIUM TC 99M TETROFOSMIN IV KIT
31.5200 | PACK | Freq: Once | INTRAVENOUS | Status: AC | PRN
  Administered 2022-04-23: 31.52 via INTRAVENOUS

## 2022-04-23 NOTE — Patient Instructions (Addendum)
Your procedure is scheduled on: 04/27/2022  Report to the Registration Desk on the 1st floor of the [facility]. To find out your arrival time, please call [phone] between 1PM - 3PM on: 04/24/2022  If your arrival time is 6:00 am, do not arrive prior to that time as the [place] entrance doors do not open until 6:00 am.  REMEMBER: Instructions that are not followed completely may result in serious medical risk, up to and including death; or upon the discretion of your surgeon and anesthesiologist your surgery may need to be rescheduled.  Do not eat food after midnight the night before surgery.  No gum chewing, lozengers or hard candies.   TAKE THESE MEDICATIONS THE MORNING OF SURGERY WITH A SIP OF WATER:  diltiazem  KLOR-CON  3. sertraline   Use use nasal spray.  Follow recommendations from Cardiologist, Pulmonologist or PCP regarding stopping Aspirin. Please remember the surgeons's instructions about stopping the aspirin.  One week prior to surgery: Stop Anti-inflammatories (NSAIDS) such as Advil, Aleve, Ibuprofen, Motrin, Naproxen, Naprosyn and Aspirin based products such as Excedrin, Goodys Powder, BC Powder. Stop ANY OVER THE COUNTER supplements until after surgery. You may however, continue to take Tylenol if needed for pain up until the day of surgery.  No Alcohol for 24 hours before or after surgery.  No Smoking including e-cigarettes for 24 hours prior to surgery.  No chewable tobacco products for at least 6 hours prior to surgery.  No nicotine patches on the day of surgery.  Do not use any "recreational" drugs for at least a week prior to your surgery.  Please be advised that the combination of cocaine and anesthesia may have negative outcomes, up to and including death. If you test positive for cocaine, your surgery will be cancelled.  On the morning of surgery brush your teeth with toothpaste and water, you may rinse your mouth with mouthwash if you  wish. Do not swallow any toothpaste or mouthwash.  Use CHG Soap or wipes as directed on instruction sheet.  Do not wear jewelry, make-up, hairpins, clips or nail polish.  Do not wear lotions, powders, or perfumes.   Do not shave body from the neck down 48 hours prior to surgery just in case you cut yourself which could leave a site for infection.  Also, freshly shaved skin may become irritated if using the CHG soap.  Contact lenses, hearing aids and dentures may not be worn into surgery.  Do not bring valuables to the hospital. [facility] is not responsible for any missing/lost belongings or valuables.   Notify your doctor if there is any change in your medical condition (cold, fever, infection).  Wear comfortable clothing (specific to your surgery type) to the hospital.  After surgery, you can help prevent lung complications by doing breathing exercises.  Take deep breaths and cough every 1-2 hours. Your doctor may order a device called an Incentive Spirometer to help you take deep breaths. If you are being admitted to the hospital overnight, leave your suitcase in the car. After surgery it may be brought to your room.  If you are being discharged the day of surgery, you will not be allowed to drive home. You will need a responsible adult (18 years or older) to drive you home and stay with you that night.   If you are taking public transportation, you will need to have a responsible adult (18 years or older) with you. Please confirm with your physician that it  is acceptable to use public transportation.   Please call the [facility] Dept. at [phone] if you have any questions about these instructions.  Surgery Visitation Policy:  Patients undergoing a surgery or procedure may have two family members or support persons with them as long as the person is not COVID-19 positive or experiencing its symptoms.   Inpatient Visitation:    Visiting hours are 7 a.m. to  8 p.m. Up to four visitors are allowed at one time in a patient room, including children. The visitors may rotate out with other people during the day. One designated support person (adult) may remain overnight.

## 2022-04-24 ENCOUNTER — Encounter
Admission: RE | Admit: 2022-04-24 | Discharge: 2022-04-24 | Disposition: A | Discharge status: Home / Self Care | Payer: Medicare HMO | Source: Ambulatory Visit | Attending: Pulmonary Disease | Admitting: Pulmonary Disease

## 2022-04-24 ENCOUNTER — Ambulatory Visit
Admission: RE | Admit: 2022-04-24 | Discharge: 2022-04-24 | Disposition: A | Discharge status: Home / Self Care | Payer: Medicare HMO | Source: Ambulatory Visit | Attending: Pulmonary Disease | Admitting: Pulmonary Disease

## 2022-04-24 ENCOUNTER — Encounter: Payer: Self-pay | Admitting: Urgent Care

## 2022-04-24 DIAGNOSIS — Z1152 Encounter for screening for COVID-19: Secondary | ICD-10-CM | POA: Insufficient documentation

## 2022-04-24 DIAGNOSIS — Z01812 Encounter for preprocedural laboratory examination: Secondary | ICD-10-CM

## 2022-04-24 DIAGNOSIS — R911 Solitary pulmonary nodule: Secondary | ICD-10-CM | POA: Insufficient documentation

## 2022-04-24 DIAGNOSIS — R918 Other nonspecific abnormal finding of lung field: Secondary | ICD-10-CM | POA: Diagnosis not present

## 2022-04-24 DIAGNOSIS — I700 Atherosclerosis of aorta: Secondary | ICD-10-CM | POA: Diagnosis not present

## 2022-04-25 LAB — SARS CORONAVIRUS 2 (TAT 6-24 HRS): SARS Coronavirus 2: NEGATIVE

## 2022-04-27 ENCOUNTER — Ambulatory Visit
Admission: RE | Admit: 2022-04-27 | Discharge: 2022-04-27 | Disposition: A | Discharge status: Home / Self Care | Payer: Medicare HMO | Source: Ambulatory Visit | Attending: Pulmonary Disease | Admitting: Pulmonary Disease

## 2022-04-27 ENCOUNTER — Encounter
Admission: RE | Disposition: A | Discharge status: Home / Self Care | Payer: Self-pay | Source: Ambulatory Visit | Attending: Pulmonary Disease

## 2022-04-27 ENCOUNTER — Ambulatory Visit: Payer: Medicare HMO

## 2022-04-27 ENCOUNTER — Encounter: Payer: Self-pay | Admitting: Pulmonary Disease

## 2022-04-27 ENCOUNTER — Ambulatory Visit: Payer: Medicare HMO | Admitting: Registered Nurse

## 2022-04-27 ENCOUNTER — Other Ambulatory Visit: Payer: Self-pay

## 2022-04-27 DIAGNOSIS — R911 Solitary pulmonary nodule: Secondary | ICD-10-CM | POA: Insufficient documentation

## 2022-04-27 DIAGNOSIS — Z8521 Personal history of malignant neoplasm of larynx: Secondary | ICD-10-CM | POA: Insufficient documentation

## 2022-04-27 DIAGNOSIS — I10 Essential (primary) hypertension: Secondary | ICD-10-CM | POA: Diagnosis not present

## 2022-04-27 DIAGNOSIS — Z87891 Personal history of nicotine dependence: Secondary | ICD-10-CM | POA: Insufficient documentation

## 2022-04-27 DIAGNOSIS — J95811 Postprocedural pneumothorax: Secondary | ICD-10-CM | POA: Insufficient documentation

## 2022-04-27 DIAGNOSIS — K219 Gastro-esophageal reflux disease without esophagitis: Secondary | ICD-10-CM | POA: Insufficient documentation

## 2022-04-27 DIAGNOSIS — Z79899 Other long term (current) drug therapy: Secondary | ICD-10-CM | POA: Insufficient documentation

## 2022-04-27 DIAGNOSIS — I2510 Atherosclerotic heart disease of native coronary artery without angina pectoris: Secondary | ICD-10-CM | POA: Diagnosis not present

## 2022-04-27 DIAGNOSIS — J9811 Atelectasis: Secondary | ICD-10-CM | POA: Diagnosis not present

## 2022-04-27 DIAGNOSIS — I4891 Unspecified atrial fibrillation: Secondary | ICD-10-CM | POA: Diagnosis not present

## 2022-04-27 DIAGNOSIS — G4730 Sleep apnea, unspecified: Secondary | ICD-10-CM | POA: Insufficient documentation

## 2022-04-27 DIAGNOSIS — C3411 Malignant neoplasm of upper lobe, right bronchus or lung: Secondary | ICD-10-CM | POA: Diagnosis not present

## 2022-04-27 DIAGNOSIS — R69 Illness, unspecified: Secondary | ICD-10-CM | POA: Diagnosis not present

## 2022-04-27 DIAGNOSIS — J939 Pneumothorax, unspecified: Secondary | ICD-10-CM | POA: Diagnosis not present

## 2022-04-27 DIAGNOSIS — J9383 Other pneumothorax: Secondary | ICD-10-CM

## 2022-04-27 SURGERY — BRONCHOSCOPY, WITH BIOPSY USING ELECTROMAGNETIC NAVIGATION
Anesthesia: General | Laterality: Right

## 2022-04-27 MED ORDER — ORAL CARE MOUTH RINSE: 15.0000 mL | Freq: Once | OROMUCOSAL | Status: AC

## 2022-04-27 MED ORDER — ONDANSETRON HCL 4 MG/2ML IJ SOLN
INTRAMUSCULAR | Status: AC
  Filled 2022-04-27: qty 2

## 2022-04-27 MED ORDER — IPRATROPIUM-ALBUTEROL 0.5-2.5 (3) MG/3ML IN SOLN
RESPIRATORY_TRACT | Status: AC
  Administered 2022-04-27: 3 mL via RESPIRATORY_TRACT
  Filled 2022-04-27: qty 3

## 2022-04-27 MED ORDER — SUGAMMADEX SODIUM 200 MG/2ML IV SOLN
INTRAVENOUS | Status: DC | PRN
  Administered 2022-04-27: 176.4 mg via INTRAVENOUS

## 2022-04-27 MED ORDER — SODIUM CHLORIDE 0.9 % IV SOLN: Freq: Once | INTRAVENOUS | Status: AC

## 2022-04-27 MED ORDER — LACTATED RINGERS IV SOLN: INTRAVENOUS | Status: DC

## 2022-04-27 MED ORDER — ONDANSETRON HCL 4 MG/2ML IJ SOLN: 4.0000 mg | Freq: Once | INTRAMUSCULAR | Status: DC | PRN

## 2022-04-27 MED ORDER — DEXAMETHASONE SODIUM PHOSPHATE 10 MG/ML IJ SOLN
INTRAMUSCULAR | Status: AC
  Filled 2022-04-27: qty 1

## 2022-04-27 MED ORDER — ACETAMINOPHEN 10 MG/ML IV SOLN: 1000.0000 mg | Freq: Once | INTRAVENOUS | Status: DC | PRN

## 2022-04-27 MED ORDER — LIDOCAINE HCL (PF) 2 % IJ SOLN
INTRAMUSCULAR | Status: AC
  Filled 2022-04-27: qty 10

## 2022-04-27 MED ORDER — EPHEDRINE 5 MG/ML INJ
INTRAVENOUS | Status: AC
  Filled 2022-04-27: qty 10

## 2022-04-27 MED ORDER — FAMOTIDINE 20 MG PO TABS: 20.0000 mg | ORAL_TABLET | Freq: Once | ORAL | Status: DC

## 2022-04-27 MED ORDER — OXYCODONE-ACETAMINOPHEN 7.5-325 MG PO TABS: 1.0000 | ORAL_TABLET | ORAL | 0 refills | Status: AC | PRN

## 2022-04-27 MED ORDER — ROCURONIUM BROMIDE 100 MG/10ML IV SOLN
INTRAVENOUS | Status: DC | PRN
  Administered 2022-04-27: 40 mg via INTRAVENOUS

## 2022-04-27 MED ORDER — DEXAMETHASONE SODIUM PHOSPHATE 10 MG/ML IJ SOLN
INTRAMUSCULAR | Status: DC | PRN
  Administered 2022-04-27: 10 mg via INTRAVENOUS

## 2022-04-27 MED ORDER — FENTANYL CITRATE (PF) 100 MCG/2ML IJ SOLN
INTRAMUSCULAR | Status: AC
  Administered 2022-04-27: 50 ug via INTRAVENOUS
  Filled 2022-04-27: qty 2

## 2022-04-27 MED ORDER — EPHEDRINE SULFATE (PRESSORS) 50 MG/ML IJ SOLN
INTRAMUSCULAR | Status: DC | PRN
  Administered 2022-04-27 (×5): 10 mg via INTRAVENOUS

## 2022-04-27 MED ORDER — PROPOFOL 10 MG/ML IV BOLUS
INTRAVENOUS | Status: DC | PRN
  Administered 2022-04-27: 160 mg via INTRAVENOUS

## 2022-04-27 MED ORDER — CHLORHEXIDINE GLUCONATE 0.12 % MT SOLN
OROMUCOSAL | Status: AC
  Administered 2022-04-27: 15 mL via OROMUCOSAL
  Filled 2022-04-27: qty 15

## 2022-04-27 MED ORDER — LIDOCAINE HCL (CARDIAC) PF 100 MG/5ML IV SOSY
PREFILLED_SYRINGE | INTRAVENOUS | Status: DC | PRN
  Administered 2022-04-27: 100 mg via INTRAVENOUS

## 2022-04-27 MED ORDER — FENTANYL CITRATE (PF) 100 MCG/2ML IJ SOLN: 25.0000 ug | Freq: Once | INTRAMUSCULAR | Status: AC

## 2022-04-27 MED ORDER — IPRATROPIUM-ALBUTEROL 0.5-2.5 (3) MG/3ML IN SOLN: 3.0000 mL | Freq: Once | RESPIRATORY_TRACT | Status: AC

## 2022-04-27 MED ORDER — ONDANSETRON HCL 4 MG/2ML IJ SOLN
INTRAMUSCULAR | Status: DC | PRN
  Administered 2022-04-27: 4 mg via INTRAVENOUS

## 2022-04-27 MED ORDER — FENTANYL CITRATE (PF) 100 MCG/2ML IJ SOLN: 25.0000 ug | INTRAMUSCULAR | Status: DC | PRN

## 2022-04-27 MED ORDER — OXYCODONE HCL 5 MG PO TABS: 5.0000 mg | ORAL_TABLET | Freq: Once | ORAL | Status: DC | PRN

## 2022-04-27 MED ORDER — OXYCODONE HCL 5 MG/5ML PO SOLN: 5.0000 mg | Freq: Once | ORAL | Status: DC | PRN

## 2022-04-27 MED ORDER — LACTATED RINGERS IV SOLN: INTRAVENOUS | Status: DC | PRN

## 2022-04-27 MED ORDER — MIDAZOLAM HCL 2 MG/2ML IJ SOLN: 1.0000 mg | Freq: Once | INTRAMUSCULAR | Status: AC

## 2022-04-27 MED ORDER — CHLORHEXIDINE GLUCONATE 0.12 % MT SOLN: 15.0000 mL | Freq: Once | OROMUCOSAL | Status: AC

## 2022-04-27 MED ORDER — FENTANYL CITRATE (PF) 100 MCG/2ML IJ SOLN
INTRAMUSCULAR | Status: AC
  Filled 2022-04-27: qty 2

## 2022-04-27 MED ORDER — MIDAZOLAM HCL 2 MG/2ML IJ SOLN
INTRAMUSCULAR | Status: AC
  Administered 2022-04-27: 1 mg via INTRAVENOUS
  Filled 2022-04-27: qty 2

## 2022-04-27 MED ORDER — FAMOTIDINE 20 MG PO TABS
ORAL_TABLET | ORAL | Status: AC
  Filled 2022-04-27: qty 1

## 2022-04-27 MED ORDER — FENTANYL CITRATE (PF) 100 MCG/2ML IJ SOLN
INTRAMUSCULAR | Status: DC | PRN
  Administered 2022-04-27: 50 ug via INTRAVENOUS

## 2022-04-27 MED ORDER — PHENYLEPHRINE 80 MCG/ML (10ML) SYRINGE FOR IV PUSH (FOR BLOOD PRESSURE SUPPORT)
PREFILLED_SYRINGE | INTRAVENOUS | Status: DC | PRN
  Administered 2022-04-27: 160 ug via INTRAVENOUS

## 2022-04-27 NOTE — Procedures (Signed)
Chest Tube Insertion: Indication: Post procedure pneumothorax Consent: Obtained from patient  Risks and benefits explained in detail including risk of infection, bleeding, respiratory failure and death..   Hand washing performed prior to starting the procedure.   Type of Anesthesia: 1 % Lidocaine.  Patient received 1 mg Versed, 50 mcg fentanyl.  Procedure: An active timeout was performed and correct patient, name, & ID confirmed.  After explaining risks and benefits, patient positioned correctly for chest tube placement. Patient was prepped in a sterile fashion including chlorohexadine preps, sterile drape, sterile gown and sterile gloves.  A 10.5 pneumothorax catheter was placed. Using Seldinger Technique, the catheter was placed over the introducer needle.  Catheter was placed to Heimlich valve.  Chest tube secured in place with Steri-Strips and proper dressing used.  Findings: Air easily aspirated upon insertion of needle, Chest tube placed bewteen second and third ICS ribs mid clavicular line.   Chest tube connected to: Heimlich valve.   Number of Attempts: 1  Complications: None.  Estimated Blood Loss: Nil  Chest radiograph indicated and ordered.   Operator: Windy Canny. Derrill Kay, MD Advanced Bronchoscopy [facility]    *This note was dictated using voice recognition software/Dragon.  Despite best efforts to proofread, errors can occur which can change the meaning. Any transcriptional errors that result from this process are unintentional and may not be fully corrected at the time of dictation.

## 2022-04-27 NOTE — Anesthesia Preprocedure Evaluation (Addendum)
Anesthesia Evaluation  Patient identified by MRN, date of birth, ID band Patient awake    Reviewed: Allergy & Precautions, NPO status , Patient's Chart, lab work & pertinent test results  History of Anesthesia Complications Negative for: history of anesthetic complications  Airway Mallampati: IV   Neck ROM: Full    Dental   Crowns :   Pulmonary sleep apnea , Current Smoker (1 ppd) and Patient abstained from smoking.,    Pulmonary exam normal breath sounds clear to auscultation       Cardiovascular hypertension, Normal cardiovascular exam+ dysrhythmias (a fib)  Rhythm:Regular Rate:Normal  ECG 04/17/22: NSR, PACs  Myocardial perfusion 04/23/22:  Pharmacological myocardial perfusion imaging study with no significant  ischemia Normal wall motion, EF estimated at 65% No EKG changes concerning for ischemia at peak stress or in recovery. CT attenuation correction with mild three-vessel coronary calcification and aortic atherosclerosis Low risk scan   Neuro/Psych HOH    GI/Hepatic GERD  ,  Endo/Other  negative endocrine ROS  Renal/GU Renal disease (nephrolithiasis)     Musculoskeletal   Abdominal   Peds  Hematology Laryngeal CA   Anesthesia Other Findings Cardiology note 04/17/22:  1. Preop evaluation, pulmonary biopsy being considered.  Echo shows normal systolic function, patient denies chest pain or shortness of breath.  Blood pressure adequately controlled.  Okay to perform biopsy from a cardiac perspective.  EF is normal, patient is asymptomatic. 2. CAD, RCA and LAD calcifications on chest CT.  Start aspirin 81 mg, Pravachol 20 mg daily.  Obtain Lexiscan Myoview. 3. Hypertension, blood pressure controlled.  Continue HCTZ, Cardizem.  Follow-up in 6 to 8 weeks  Reproductive/Obstetrics                            Anesthesia Physical Anesthesia Plan  ASA: 2  Anesthesia Plan: General    Post-op Pain Management:    Induction: Intravenous  PONV Risk Score and Plan: 1 and Ondansetron, Dexamethasone and Treatment may vary due to age or medical condition  Airway Management Planned: Oral ETT  Additional Equipment:   Intra-op Plan:   Post-operative Plan: Extubation in OR  Informed Consent: I have reviewed the patients History and Physical, chart, labs and discussed the procedure including the risks, benefits and alternatives for the proposed anesthesia with the patient or authorized representative who has indicated his/her understanding and acceptance.     Dental advisory given  Plan Discussed with: CRNA  Anesthesia Plan Comments: (Patient consented for risks of anesthesia including but not limited to:  - adverse reactions to medications - damage to eyes, teeth, lips or other oral mucosa - nerve damage due to positioning  - sore throat or hoarseness - damage to heart, brain, nerves, lungs, other parts of body or loss of life  Informed patient about role of CRNA in peri- and intra-operative care.  Patient voiced understanding.)        Anesthesia Quick Evaluation

## 2022-04-27 NOTE — Discharge Instructions (Addendum)
During one of the biopsies the lung collapse.  You have a small chest tube that is letting the air come out so that the lung can expand.  Keep the dressing dry.  Do not touch the tubing.  If you notice any small amount of liquid coming from the end of the tube chest dry with the gauze that has been provided for you.  More morning you will come in to have a chest x-ray done at the medical mall and then after that proceed to my office where we will take a look at the chest tube and remove it if the lung has fully expanded.  Should you have any problems tonight you can come to the emergency room.  You can also call me or our physician on-call.  We will see you in follow-up in the clinic in the morning.  Phone number to the clinic is [phone].  The answering service can page me directly or the physician on-call.

## 2022-04-27 NOTE — Interval H&P Note (Signed)
Jeffrey Torres has presented today for surgery, with the diagnosis of RIGHT lung mass.  The various methods of treatment have been discussed with the patient and family. After consideration of risks, benefits and other options for treatment, the patient has consented to  Procedure(s): ROBOTIC ASSISTED NAVIGATIONAL BRONCHOSCOPY-attention right.  As a surgical intervention.  The patient's history has been reviewed, patient examined, no change in status, stable for surgery.  I have reviewed the patient's chart and labs.  Questions were answered to the patient's satisfaction.  Benefits, limitations and potential complications of the procedure were discussed with the patient/family.  Complications from bronchoscopy are rare and most often minor, but if they occur they may include breathing difficulty, vocal cord spasm, hoarseness, slight fever, vomiting, dizziness, bronchospasm, infection, low blood oxygen, bleeding from biopsy site, or an allergic reaction to medications.  It is uncommon for patients to experience other more serious complications for example: Collapsed lung requiring chest tube placement, respiratory failure, heart attack and/or cardiac arrhythmia.  Patient agrees to proceed.   [name], MD Advanced Bronchoscopy P[facility]    *This note was dictated using voice recognition software/Dragon.  Despite best efforts to proofread, errors can occur which can change the meaning. Any transcriptional errors that result from this process are unintentional and may not be fully corrected at the time of dictation.

## 2022-04-27 NOTE — Transfer of Care (Signed)
Immediate Anesthesia Transfer of Care Note  Patient: Jeffrey Torres  Procedure(s) Performed: Procedure(s): ROBOTIC ASSISTED NAVIGATIONAL BRONCHOSCOPY (Right)  Patient Location: PACU  Anesthesia Type:General  Level of Consciousness: sedated  Airway & Oxygen Therapy: Patient Spontanous Breathing and Patient connected to face mask oxygen  Post-op Assessment: Report given to RN and Post -op Vital signs reviewed and stable  Post vital signs: Reviewed and stable  Last Vitals:  Vitals:   04/27/22 1136 04/27/22 1423  BP: 131/66 (!) 126/55  Pulse: (!) 52 63  Resp: 16   Temp: 36.5 C   SpO2: 21% 22%    Complications: No apparent anesthesia complications

## 2022-04-27 NOTE — Op Note (Signed)
PROCEDURES: Survey bronchoscopy Robotic assisted bronchoscopy Cellvizio probe based confocal laser endomicroscopy (pCLE) Augmented fluoroscopy Endobronchial ultrasound   Indication: Right upper lobe nodule/mass in a patient with prior laryngeal cancer stage I.  Preoperative Diagnosis: Right upper lobe nodule/mass 2.0 x 1.7 cm, FDG avid, rule out cancer Post Procedure Diagnosis: Same as above Consent: Verbal/Written: obtained  Benefits, limitations and potential complications of the procedure were discussed with the patient/family.  Complications from bronchoscopy are rare and most often minor, but if they occur they may include breathing difficulty, vocal cord spasm, hoarseness, slight fever, vomiting, dizziness, bronchospasm, infection, low blood oxygen, bleeding from biopsy site, or an allergic reaction to medications.  It is uncommon for patients to experience other more serious complications for example: Collapsed lung requiring chest tube placement, respiratory failure, heart attack and/or cardiac arrhythmia.  Patient understood the potential complications and agreed to proceed.  Surgeon: [name], MD Assistant/Scrub: Liborio Nixon, RRT Circulator: N/A Anesthesiologist/CRNA: Darrin Nipper, MD/Linda Vicente Serene, CRNA Cytotechnology: Maryan Puls, present Fluoroscopy technician: Denver Faster, RT Representatives: N/A  Type of Anesthesia: General endotracheal  Procedures Performed:   Robotic bronchoscopy: Procedure consists of robotic navigation comprised of electromagnetics, optical pattern recognition and robotic kinematic data - to triangulate bronchoscope location during the procedure and provide accurate positional data to biopsy a lesion. Cellvizio probe based confocal laser endomicroscopy (pCLE) utilizing blue laser endomicroscopy. Augmented fluoroscopy with Body Vision.  Description of Procedure:  Robotic bronchoscopy: The patient was brought to Procedure Room 2  (Bronchoscopy Suite) in the OR area where appropriate timeout was taken with the staff after the patient was inducted under general anesthesia.  The patient was inducted under general anesthesia and intubated by the anesthesia team.  Patient was intubated with a 8.5 ET tube without difficulty.  Tube was secured at 4 cm above the carina.  A Portex adapter was placed on the ET tube flange.  Once the patient was under adequate general anesthesia the Olympus therapeutic video bronchoscope was advanced and an anatomic airway tour and surveillance bronchoscopy was performed.The distal trachea appeared unremarkable. The main carina was sharp.  No secretions were seen in either right or left mainstem bronchi. The RUL, RLL, RML appeared to be free of endobronchial masses, lesions, or purulent secretions. Likewise, the LLL/LUL appeared to be free of endobronchial masses, lesions, or purulent secretions.  There were some scant benign appearing secretions on the right upper lobe that were suctioned until cleared.  Once the survey bronchoscopy was completed, registration for the augmented fluoroscopy (Body Vision) was then performed with the fluoroscopic C arm.  Once this was completed, the robotic bronchoscope ET tube adapter was placed and ETT was cut to proper length and secured on the mid plane.  The [facility] robotic scope was then advanced through the ETT and registration was performed successfully.  There was good correlation between the robotic mapping and bronchoscopic mapping. With the assistance of fused navigation, the bronchoscope was advanced to the RUL nodule/mass. The tip of the working channel sat within 12 mm of the nodule  Positioning was confirmed with augmented fluoroscopy.  At this point Cellvizio probe based confocal laser endomicroscopy (pCLE) was utilized to confirm target acquisition.  The Cellvizio probe however could not provide images due to the probe malfunctioning.   Augmented fluoroscopy via Body  Vision was utilized to optimize the position most favorable for biopsies, then the robotic bronchoscope was anchored to maintain position.  Transbronchial aspiration with a an Olympus PeriView Flex anyone gauge needle was performed.  On the first attempt with the needle the lesion was noted to be very dense and hard and the needle bent towards the lateral surface.  This was immediately retracted.  Since 2 subsequent passes were performed, the first pass was subjected to ROSE and findings were consistent with lesional cells.  Additional passes were then performed with a superDimension aspiration needle.  Total needle passes were 6.  A total of 14 transbronchial biopsies were obtained at this point, the lesion could be seen moving during biopsies.  ROSE of 2 of the specimens obtained were consistent with atypia.  After confirmation of excellent hemostasis, three passes with a cytology brush on the well nodule/mass were performed, the brushes were cut into CytoLyt preservative.  A BAL was also obtained at this segment, which sent for cytology analysis.  For BAL sample acquisition purposes, NT ml of normal saline were instilled, and approximately 8 ml were recovered/trapped and sent for analysis.   The robotic bronchoscope was then retracted all the way out after confirmation of excellent hemostasis.  At this point an Olympus endobronchial ultrasound scope was inserted into the airway via the Portex adapter.  Examination of the nodal stations was then performed.  There was no significant adenopathy within the examined nodal stations.  Largest lymph node observed was 0.5 cm and benign appearing in the subcarinal space.  After examination with EBUS was completed,the patient received bronchial lavage with 8 mL of 1% lidocaine via the ET tube. The patient tolerated the procedure well. No significant bleeding was observed at the conclusion of the procedure.  At this point, the patient was allowed to emerge from general  anesthesia, and was extubated in the procedure room without incident.  The patient  was taken to the PACU in satisfactory condition.  Auscultation of the lungs showed diminished breath sounds on the RIGHT upper lung zone.  Patient tolerated the procedure well with no untoward effects of anesthesia noted.    Specimens Obtained:  Transbronchial Forceps Biopsy: X 14 RUL  Transbronchial Brush: X 3  Transbronchial Needle Aspirate:  Targeted BAL: RUL , 8 ml  Fluoroscopy: Augmented fluoroscopy (Body Vision) was utilized during the course of this procedure to assure that biopsies were taken in a safe manner under fluoroscopic guidance with spot films required.  Total fluoroscopy time: X minutes 36 seconds, total dose 60.41 mGy.  Intraoperative image:  Fluoroscopic images scope at target:   Mucosal abnormality just before reaching lesion:   Scope directed at target:     Complications: 62% pneumothorax post likely occurred during the deflection of needle as noted on the body report.   Estimated Blood Loss: Nil    Assessment and Plan/Additional Comments: Right upper lobe lung nodule/mass, preliminary finding of lesional cells on ROSE await formal pathology report Postprocedure pneumothorax likely occurred during the flexion of needle from the lesion patient asymptomatic We will place chest tube Patient has appropriate follow-ups     C. Derrill Kay, MD Advanced Bronchoscopy PCCM [facility]    *This note was dictated using voice recognition software/Dragon.  Despite best efforts to proofread, errors can occur which can change the meaning.  Any change was purely unintentional.

## 2022-04-27 NOTE — Anesthesia Postprocedure Evaluation (Signed)
Anesthesia Post Note  Patient: Jeffrey Torres  Procedure(s) Performed: ROBOTIC ASSISTED NAVIGATIONAL BRONCHOSCOPY (Right)  Patient location during evaluation: PACU Anesthesia Type: General Level of consciousness: awake and alert, oriented and patient cooperative Pain management: pain level controlled Vital Signs Assessment: post-procedure vital signs reviewed and stable Respiratory status: spontaneous breathing, nonlabored ventilation and respiratory function stable Cardiovascular status: blood pressure returned to baseline and stable Postop Assessment: adequate PO intake Anesthetic complications: no   No notable events documented.   Last Vitals:  Vitals:   04/27/22 1423 04/27/22 1425  BP: (!) 126/55   Pulse: 63 72  Resp: (!) 23 (!) 24  Temp: (!) 36.1 C   SpO2: 94% 97%    Last Pain:  Vitals:   04/27/22 1423  TempSrc:   PainSc: 0-No pain                 Darrin Nipper

## 2022-04-27 NOTE — Anesthesia Procedure Notes (Signed)
Procedure Name: Intubation Date/Time: 04/27/2022 12:51 PM  Performed by: Doreen Salvage, CRNAPre-anesthesia Checklist: Patient identified, Patient being monitored, Timeout performed, Emergency Drugs available and Suction available Patient Re-evaluated:Patient Re-evaluated prior to induction Oxygen Delivery Method: Circle system utilized Preoxygenation: Pre-oxygenation with 100% oxygen Induction Type: IV induction Ventilation: Mask ventilation without difficulty Laryngoscope Size: Mac and 3 Grade View: Grade I Tube type: Oral Tube size: 8.5 mm Number of attempts: 1 Airway Equipment and Method: Stylet Placement Confirmation: ETT inserted through vocal cords under direct vision, positive ETCO2 and breath sounds checked- equal and bilateral Secured at: 26 cm Tube secured with: Tape Dental Injury: Teeth and Oropharynx as per pre-operative assessment

## 2022-04-28 ENCOUNTER — Ambulatory Visit (INDEPENDENT_AMBULATORY_CARE_PROVIDER_SITE_OTHER): Payer: Medicare HMO | Admitting: Pulmonary Disease

## 2022-04-28 ENCOUNTER — Institutional Professional Consult (permissible substitution): Payer: Medicare HMO | Admitting: Radiation Oncology

## 2022-04-28 ENCOUNTER — Ambulatory Visit
Admission: RE | Admit: 2022-04-28 | Discharge: 2022-04-28 | Disposition: A | Discharge status: Home / Self Care | Payer: Medicare HMO | Attending: Pulmonary Disease | Admitting: Pulmonary Disease

## 2022-04-28 ENCOUNTER — Ambulatory Visit
Admission: RE | Admit: 2022-04-28 | Discharge: 2022-04-28 | Disposition: A | Discharge status: Home / Self Care | Payer: Medicare HMO | Source: Ambulatory Visit | Attending: Pulmonary Disease | Admitting: Pulmonary Disease

## 2022-04-28 ENCOUNTER — Encounter: Payer: Self-pay | Admitting: Pulmonary Disease

## 2022-04-28 VITALS — BP 128/78 | HR 58 | Temp 98.2°F | Ht 70.0 in | Wt 194.7 lb

## 2022-04-28 DIAGNOSIS — J439 Emphysema, unspecified: Secondary | ICD-10-CM | POA: Diagnosis not present

## 2022-04-28 DIAGNOSIS — J9383 Other pneumothorax: Secondary | ICD-10-CM | POA: Insufficient documentation

## 2022-04-28 DIAGNOSIS — J95811 Postprocedural pneumothorax: Secondary | ICD-10-CM

## 2022-04-28 DIAGNOSIS — J939 Pneumothorax, unspecified: Secondary | ICD-10-CM | POA: Diagnosis not present

## 2022-04-28 DIAGNOSIS — R911 Solitary pulmonary nodule: Secondary | ICD-10-CM

## 2022-04-28 NOTE — Progress Notes (Signed)
Subjective:    Patient ID: Jeffrey Torres, male    DOB: 1946-06-09, 76 y.o.   MRN: [id number] Patient Care Team: Birdie Sons, MD as PCP - General (Family Medicine) Kate Sable, MD as PCP - Cardiology (Cardiology) Birder Robson, MD as Referring Physician (Ophthalmology) Carloyn Manner, MD as Referring Physician (Otolaryngology) Noreene Filbert, MD as Referring Physician (Radiation Oncology) Isaias Sakai, MD as Referring Physician (Ophthalmology) Telford Nab, RN as Oncology Nurse Navigator Tyler Pita, MD as Consulting Physician (Pulmonary Disease)  Chief Complaint  Patient presents with   Follow-up    SOB when he lays flat. Bronchoscopy 04/27/2022.   HPI 76 year old recent former smoker (26 April 2022, 57 PY) since 4 follow-up on postprocedural pneumothorax after bronchoscopy yesterday.  Patient had an uneventful night.  He is managing chest tube at home without difficulty.  Had 1 episode of shortness of breath while laying down last night but this cleared up very quickly.  Discomfort chest tube site had to take 1 Percocet.  Since then no dyspnea, no cough, no chest pain.  States he feels "great".  Ambulating without difficulty.  No conversational dyspnea.   Review of Systems A 10 point review of systems was performed and it is as noted above otherwise negative.  Patient Active Problem List   Diagnosis Date Noted   Depression 04/22/2022   Right upper lobe pulmonary nodule 03/20/2022   Dizziness 03/14/2022   Hypokalemia 03/14/2022   Hypernatremia 07/31/2020   History of colonic polyps    Severe obstructive sleep apnea 02/21/2020   Primary hypertension 08/14/2019   Atrial bigeminy 08/14/2019   History of laryngeal cancer 12/28/2017   History of adenomatous polyp of colon 10/30/2015   Inguinal hernia, bilateral 10/21/2015   PVC (premature ventricular contraction) 10/21/2015   Tobacco dependence 44/06/270   Umbilical hernia 53/66/4403   Social  History   Tobacco Use   Smoking status: Every Day    Packs/day: 1.00    Years: 50.00    Total pack years: 50.00    Types: Cigarettes    Start date: 1973   Smokeless tobacco: Never   Tobacco comments:    started age 52 yo.previously smoked carton a week    3/4 of a pack a day. 04/21/2022 khj    Quit smoking 04/26/2022 khj  Substance Use Topics   Alcohol use: Not Currently    Alcohol/week: 0.0 standard drinks of alcohol   Allergies  Allergen Reactions   Penicillins Anaphylaxis and Swelling    Did it involve swelling of the face/tongue/throat, SOB, or low BP? Yes Did it involve sudden or severe rash/hives, skin peeling, or any reaction on the inside of your mouth or nose? No Did you need to seek medical attention at a hospital or doctor's office? No When did it last happen?      10 + years ago If all above answers are "NO", may proceed with cephalosporin use.    Trazodone And Nefazodone    Current Meds  Medication Sig   aspirin 81 MG tablet Take 81 mg by mouth daily.   buPROPion (WELLBUTRIN SR) 150 MG 12 hr tablet 1 tablet daily for 3 days, then 1 tablet twice daily. Stop smoking 14 days after starting medication   diltiazem (CARDIZEM CD) 120 MG 24 hr capsule Take 1 capsule (120 mg total) by mouth daily.   fluticasone (FLONASE) 50 MCG/ACT nasal spray Place 2 sprays into both nostrils every morning.    hydrochlorothiazide (HYDRODIURIL) 25 MG tablet Take  1 tablet (25 mg total) by mouth daily.   KLOR-CON M20 20 MEQ tablet TAKE 1 TABLET BY MOUTH EVERY DAY (Patient taking differently: Take 20 mEq by mouth daily.)   Multiple Vitamins-Minerals (PRESERVISION AREDS 2 PO) Take 2 capsules by mouth every morning.    omeprazole (PRILOSEC) 20 MG capsule Take 20 mg by mouth daily before breakfast.   oxyCODONE-acetaminophen (PERCOCET) 7.5-325 MG tablet Take 1 tablet by mouth every 4 (four) hours as needed for up to 3 days for severe pain.   pravastatin (PRAVACHOL) 20 MG tablet Take 1 tablet (20  mg total) by mouth every evening.   ramelteon (ROZEREM) 8 MG tablet Take 1 tablet (8 mg total) by mouth at bedtime.   sertraline (ZOLOFT) 100 MG tablet Take 1 tablet (100 mg total) by mouth daily.   sildenafil (VIAGRA) 50 MG tablet Take 1 tablet (50 mg total) by mouth daily as needed for erectile dysfunction.   tamsulosin (FLOMAX) 0.4 MG CAPS capsule Take 1 capsule (0.4 mg total) by mouth daily after supper.   Immunization History  Administered Date(s) Administered   Fluad Quad(high Dose 65+) 08/07/2020, 03/18/2021, 03/17/2022   Influenza, High Dose Seasonal PF 03/25/2017, 03/29/2018, 04/24/2019   Pneumococcal Conjugate-13 04/26/2014   Pneumococcal Polysaccharide-23 05/28/2012       Objective:   Physical Exam BP 128/78 (BP Location: Left Wrist, Cuff Size: Normal)   Pulse (!) 58   Temp 98.2 F (36.8 C)   Ht 5\' 10"  (1.778 m)   Wt 194 lb 11.2 oz (88.3 kg)   SpO2 94%   BMI 27.94 kg/m  GENERAL: Well-developed, overweight gentleman in no acute distress, fully ambulatory, no conversational dyspnea.  Fluent speech. HEAD: Normocephalic, atraumatic.  EYES: Pupils equal, round, reactive to light.  No scleral icterus.  MOUTH: Oral mucosa moist.  No thrush. NECK: Supple. No thyromegaly. Trachea midline. No JVD.  No adenopathy.  Mild subcu emphysema on right neck. PULMONARY: Improved air entry on right lung.  No adventitious sounds.  Anterior chest tube in place, good respiratory variation with Heimlich valve noted  working. CARDIOVASCULAR: S1 and S2. Regular rate and rhythm, occasional extrasystoles.  No rubs, murmurs or gallops heard. ABDOMEN: Benign. MUSCULOSKELETAL: No joint deformity, no clubbing, no edema.  NEUROLOGIC: No overt focal deficit, no gait disturbance, speech is fluent. SKIN: Intact,warm,dry. PSYCH: Mood and behavior normal  Chest x-ray performed today shows improved expansion of the lung with some residual pneumothorax still seen:     Assessment & Plan:     ICD-10-CM    1. Postprocedural pneumothorax  J95.811 DG Chest 2 View   Continue chest tube, functioning well Recheck chest x-ray in the morning Received detailed instructions on care Patient and SO taking good care of chest tube    2. Right upper lobe pulmonary nodule 2 x 1.7 cm  R91.1    Awaiting path reports     Orders Placed This Encounter  Procedures   DG Chest 2 View    Standing Status:   Future    Standing Expiration Date:   04/29/2023    Order Specific Question:   Reason for Exam (SYMPTOM  OR DIAGNOSIS REQUIRED)    Answer:   S/p bronch, pneumothorax    Order Specific Question:   Preferred imaging location?    Answer:   [facility]   We will see him in follow-up in the morning with a chest x-ray done at that time.  Reiterated instructions of chest tube care.  Dressing intact and clean.  Chest tube functioning well.  Patient understands to present to ED if any undue symptoms of chest pain, shortness of breath or tachypnea ensue.  He has SO with him.  She will stay with him tonight as well.  [name], MD Advanced Bronchoscopy PCCM [facility]    *This note was dictated using voice recognition software/Dragon.  Despite best efforts to proofread, errors can occur which can change the meaning. Any transcriptional errors that result from this process are unintentional and may not be fully corrected at the time of dictation.

## 2022-04-28 NOTE — Patient Instructions (Signed)
The lung looks better.  The tube is working well.  Continue to keep it clean and dry.  We will see you back tomorrow for a chest x-ray like we did today and then follow-up in the office.  Again, should you experience any change in your breathing increasing pain or anything that seems "off" to you please do not hesitate to call or come to the emergency room.

## 2022-04-28 NOTE — Progress Notes (Signed)
Patient developed pneumothorax postprocedure.  A pneumothorax catheter was placed.  Placed to Heimlich valve.  The Heimlich valve is tidaling appropriately.  There was aspirated back after confirmation of tube placement with chest x-ray.  Patient is totally asymptomatic from his pneumothorax.  Desires to go home.  We will keep chest tube in place with Heimlich valve.  Tube has been secured and dressed.  With follow-up in the morning with chest x-ray and reevaluation at the office.  Prior to discharge vitals: BP (!) 132/57   Pulse 60   Temp (!) 97 F (36.1 C)   Resp 16   Ht 5\' 10"  (1.778 m)   Wt 88.2 kg   SpO2 95%   BMI 27.90 kg/m  Patient in no distress.  No tachypnea. Breath sounds increasing on the right.  Patient received instructions to present to the emergency room immediately if any symptoms ensue such as chest pain, labored breathing etc.  He will have someone stay with him tonight.  [name], MD Advanced Bronchoscopy PCCM [facility]

## 2022-04-29 ENCOUNTER — Encounter: Payer: Self-pay | Admitting: Pulmonary Disease

## 2022-04-29 ENCOUNTER — Ambulatory Visit (INDEPENDENT_AMBULATORY_CARE_PROVIDER_SITE_OTHER): Payer: Medicare HMO | Admitting: Pulmonary Disease

## 2022-04-29 ENCOUNTER — Ambulatory Visit
Admission: RE | Admit: 2022-04-29 | Discharge: 2022-04-29 | Disposition: A | Discharge status: Home / Self Care | Payer: Medicare HMO | Attending: Pulmonary Disease | Admitting: Pulmonary Disease

## 2022-04-29 ENCOUNTER — Ambulatory Visit
Admission: RE | Admit: 2022-04-29 | Discharge: 2022-04-29 | Disposition: A | Discharge status: Home / Self Care | Payer: Medicare HMO | Source: Ambulatory Visit | Attending: Pulmonary Disease | Admitting: Pulmonary Disease

## 2022-04-29 VITALS — BP 120/78 | HR 54 | Temp 97.6°F | Ht 70.0 in | Wt 194.0 lb

## 2022-04-29 DIAGNOSIS — R911 Solitary pulmonary nodule: Secondary | ICD-10-CM | POA: Diagnosis not present

## 2022-04-29 DIAGNOSIS — J95811 Postprocedural pneumothorax: Secondary | ICD-10-CM

## 2022-04-29 DIAGNOSIS — J439 Emphysema, unspecified: Secondary | ICD-10-CM | POA: Diagnosis not present

## 2022-04-29 DIAGNOSIS — J939 Pneumothorax, unspecified: Secondary | ICD-10-CM | POA: Diagnosis not present

## 2022-04-29 NOTE — Patient Instructions (Signed)
Keep the dressing dry for another 24 hours.  You may remove the dressing after 24 hours and just put a nonstick dressing (large Band-Aid, nonstick)  Do let us know if you develop any increasing pain, shortness of breath or swelling.  The swelling you currently have should go down over the next few days.  You can actually massage it down as well.  We will see you in follow-up as previously scheduled in December however we will notify you of the results of your chest x-ray tomorrow and also off the biopsies as soon as these results are known.

## 2022-04-29 NOTE — Progress Notes (Signed)
Subjective:    Patient ID: Jeffrey Torres, male    DOB: [birth year], 76 y.o.   MRN: [id number] Patient Care Team: Birdie Sons, MD as PCP - General (Family Medicine) Kate Sable, MD as PCP - Cardiology (Cardiology) Birder Robson, MD as Referring Physician (Ophthalmology) Carloyn Manner, MD as Referring Physician (Otolaryngology) Noreene Filbert, MD as Referring Physician (Radiation Oncology) Isaias Sakai, MD as Referring Physician (Ophthalmology) Telford Nab, RN as Oncology Nurse Navigator Tyler Pita, MD as Consulting Physician (Pulmonary Disease)  Chief Complaint  Patient presents with   Follow-up    Less pain today. No SOB, wheezing or cough.    HPI 76 year old follows up from his visit yesterday for pneumothorax after procedure.  Chest pain has resolved.  No shortness of breath.  Feels well.  Sure he wants to do yard work today.  Advised against it.  No fevers, chills or sweats.  No cough or sputum production.  No hemoptysis.  Has some mild subcu emphysema still persistent but not increased from yesterday.  Examination of the chest tube shows that this has quit working.   Review of Systems A 10 point review of systems was performed and it is as noted above otherwise negative.  Patient Active Problem List   Diagnosis Date Noted   Depression 04/22/2022   Right upper lobe pulmonary nodule 03/20/2022   Dizziness 03/14/2022   Hypokalemia 03/14/2022   Hypernatremia 07/31/2020   History of colonic polyps    Severe obstructive sleep apnea 02/21/2020   Primary hypertension 08/14/2019   Atrial bigeminy 08/14/2019   History of laryngeal cancer 12/28/2017   History of adenomatous polyp of colon 10/30/2015   Inguinal hernia, bilateral 10/21/2015   PVC (premature ventricular contraction) 10/21/2015   Tobacco dependence 09/32/6712   Umbilical hernia 45/80/9983   .    Objective:   Physical Exam BP 120/78 (BP Location: Left Arm, Cuff Size: Large)    Pulse (!) 54   Temp 97.6 F (36.4 C)   Ht 5\' 10"  (1.778 m)   Wt 194 lb (88 kg)   SpO2 98%   BMI 27.84 kg/m  GENERAL: Well-developed, overweight gentleman in no acute distress, fully ambulatory, no conversational dyspnea.  Fluent speech. HEAD: Normocephalic, atraumatic.  EYES: Pupils equal, round, reactive to light.  No scleral icterus.  MOUTH: Oral mucosa moist.  No thrush. NECK: Supple. No thyromegaly. Trachea midline. No JVD.  No adenopathy.  Mild subcu emphysema on right neck. PULMONARY: Symmetrical lung sounds good air entry bilaterally.  No adventitious sounds.  Anterior chest tube in place, nonfunctional. CARDIOVASCULAR: S1 and S2. Regular rate and rhythm, occasional extrasystoles.  No rubs, murmurs or gallops heard. ABDOMEN: Benign. MUSCULOSKELETAL: No joint deformity, no clubbing, no edema.  NEUROLOGIC: No overt focal deficit, no gait disturbance, speech is fluent. SKIN: Intact,warm,dry. PSYCH: Mood and behavior normal  Chest x-ray today, independently reviewed:      Chest tube removed has no longer functional. Assessment & Plan:     ICD-10-CM   1. Postprocedural pneumothorax  J95.811 DG Chest 2 View   Minimal residual apical Chest tube and nonfunctional Subcu emphysema as prior DC'd chest tube    2. Right upper lobe pulmonary nodule 2 x 1.7 cm  R91.1    Awaiting pathology reports     Orders Placed This Encounter  Procedures   DG Chest 2 View    Standing Status:   Future    Standing Expiration Date:   10/28/2022    Order Specific Question:  Reason for Exam (SYMPTOM  OR DIAGNOSIS REQUIRED)    Answer:   Pneumo    Order Specific Question:   Preferred imaging location?    Answer:   [facility]   We will see the patient in follow-up as previously scheduled on 5 December.  However, he will get a chest x-ray tomorrow and call him with the results.  He will notify us if he develops any issues with increasing shortness of breath, chest pain or swelling.  He has  been instructed on proper care of his chest tube site.  [name], MD Advanced Bronchoscopy PCCM [facility]    *This note was dictated using voice recognition software/Dragon.  Despite best efforts to proofread, errors can occur which can change the meaning. Any transcriptional errors that result from this process are unintentional and may not be fully corrected at the time of dictation.

## 2022-04-30 ENCOUNTER — Ambulatory Visit
Admission: RE | Admit: 2022-04-30 | Discharge: 2022-04-30 | Disposition: A | Discharge status: Home / Self Care | Payer: Medicare HMO | Source: Ambulatory Visit | Attending: Pulmonary Disease | Admitting: Pulmonary Disease

## 2022-04-30 ENCOUNTER — Other Ambulatory Visit: Payer: Self-pay | Admitting: Pathology

## 2022-04-30 ENCOUNTER — Telehealth: Payer: Self-pay

## 2022-04-30 DIAGNOSIS — R911 Solitary pulmonary nodule: Secondary | ICD-10-CM

## 2022-04-30 DIAGNOSIS — J439 Emphysema, unspecified: Secondary | ICD-10-CM | POA: Diagnosis not present

## 2022-04-30 DIAGNOSIS — J939 Pneumothorax, unspecified: Secondary | ICD-10-CM | POA: Diagnosis not present

## 2022-04-30 DIAGNOSIS — J95811 Postprocedural pneumothorax: Secondary | ICD-10-CM | POA: Insufficient documentation

## 2022-04-30 LAB — CYTOLOGY - NON PAP

## 2022-04-30 LAB — SURGICAL PATHOLOGY

## 2022-04-30 NOTE — Telephone Encounter (Signed)
Jeffrey Pita, MD  Linwood Dibbles, CMA Cc: Hettie Holstein, CMA Patient is aware of results.  I have discussed with Dr. [name].  We will refer the patient to Dr. Erasmo Leventhal thoracic surgery.  Patient is in agreement.  Please send referral to thoracic surgery attention Dr. Roxan Hockey.  I will send Dr. Roxan Hockey a staff message.  Referral placed. Nothing further needed.

## 2022-05-01 ENCOUNTER — Encounter: Payer: Self-pay | Admitting: *Deleted

## 2022-05-01 NOTE — Progress Notes (Signed)
Spoke with patient to inform that will be cancelling his follow up appts with Dr. Baruch Gouty and Dr. [name] next week since pt is seeking surgical evaluation at this time. Informed pt that will get him rescheduled for follow up if not a surgical candidate or have him see Dr. [name] a few weeks after surgery. Instructed pt to call with any questions or needs. Pt verbalized understanding. Will continue to follow.

## 2022-05-05 ENCOUNTER — Institutional Professional Consult (permissible substitution): Payer: Medicare HMO | Admitting: Radiation Oncology

## 2022-05-05 ENCOUNTER — Ambulatory Visit: Payer: Medicare HMO | Admitting: Oncology

## 2022-05-06 ENCOUNTER — Encounter: Payer: Medicare HMO | Admitting: Thoracic Surgery (Cardiothoracic Vascular Surgery)

## 2022-05-06 ENCOUNTER — Institutional Professional Consult (permissible substitution): Payer: Medicare HMO | Admitting: Thoracic Surgery (Cardiothoracic Vascular Surgery)

## 2022-05-06 ENCOUNTER — Encounter: Payer: Self-pay | Admitting: Thoracic Surgery (Cardiothoracic Vascular Surgery)

## 2022-05-06 VITALS — BP 118/73 | HR 75 | Resp 20 | Ht 70.0 in | Wt 195.0 lb

## 2022-05-06 DIAGNOSIS — R911 Solitary pulmonary nodule: Secondary | ICD-10-CM | POA: Diagnosis not present

## 2022-05-06 NOTE — H&P (View-Only) (Signed)
PCP is Birdie Sons, MD Referring Provider is Tyler Pita, MD  Chief Complaint  Patient presents with   Lung Lesion    CT chest 9/17, PET 10/11, Spirometry 10/24, ENB 10/30    HPI: [name] is sent for consultation regarding a right upper lobe lung mass biopsy positive for carcinoma.  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  He was admitted to the hospital at [facility] on 03/14/2022 with the dizziness.  During his work-up he was found to have a right upper lobe lung nodule.  That was confirmed with CT.  Dizziness was felt to be cardiac in origin.  He went home after 2 days.  He had a cardiac work-up as an outpatient which included an echocardiogram which showed normal left-ventricular function.  A stress perfusion study was negative for ischemia.  PET/CT showed the right upper lobe nodule was hypermetabolic.  There were 2 mildly hypermetabolic satellite nodules.  He underwent robotic bronchoscopy.  Biopsies showed carcinoma.  It could not be further classified.  He has about a 50-pack-year history of smoking although he did quit for about 5 years after he was first diagnosed with laryngeal cancer.  Currently smoking just less than a pack a day.  He has a prescription for Wellbutrin but has not set a quit date yet.  He is retired because of his vision with macular degeneration.  Otherwise he says he would still be working.  He can walk long distances without stopping or getting short of breath.  He can walk up a flight of stairs without stopping.  No change in appetite or weight loss.  No unusual headaches or seizures.  Denies chest pain, pressure, or tightness.  Zubrod Score: At the time of surgery this patient's most appropriate activity status/level should be described as: []     0    Normal activity, no symptoms [x]     1     Restricted in physical strenuous activity but ambulatory, able to do out light work []     2    Ambulatory and capable of self care, unable to do work activities, up and about >50 % of waking hours                              []     3    Only limited self care, in bed greater than 50% of waking hours []     4    Completely disabled, no self care, confined to bed or chair []     5    Moribund  Past Medical History:  Diagnosis Date   Acute metabolic encephalopathy 53/66/4403   Anxiety    Arthritis    Atrial fibrillation (HCC)    Cancer (HCC)    larynx ca, surgery and radiation   GERD (gastroesophageal reflux disease)    Heart murmur    Hernia, inguinal, bilateral    History of chicken pox    History of kidney stones    History of measles    History of mumps    History of shingles    HOH (hard of hearing)    Macular degeneration of both eyes    Motion sickness    ocean boat   Pneumonia due to COVID-19 virus 07/31/2020   PVC (premature ventricular contraction)  Past Surgical History:  Procedure Laterality Date   CARDIAC CATHETERIZATION  2010   normal per patient report   CATARACT EXTRACTION W/PHACO Right 07/18/2019   Procedure: CATARACT EXTRACTION PHACO AND INTRAOCULAR LENS PLACEMENT (I[facility]) RIGHT 4.54 00:29.7;  Surgeon: Birder Robson, MD;  Location: [place];  Service: Ophthalmology;  Laterality: Right;   CATARACT EXTRACTION W/PHACO Left 08/08/2019   Procedure: CATARACT EXTRACTION PHACO AND INTRAOCULAR LENS PLACEMENT ([place]) LEFT;  Surgeon: Birder Robson, MD;  Location: [facility];  Service: Ophthalmology;  Laterality: Left;  Lot #[id number] H [place]: 00:34.5 CDE: 4.75   COLONOSCOPY  06/24/2006   Dr. Bary Castilla. Multiple benign appearing 28mm polyps in the cecum and in the rectum. -Three 75mm polyps in the transversed colon, in the tranverse colon, proximal and in the distal transverse colon. Resected and retrieved.   COLONOSCOPY WITH PROPOFOL N/A 03/12/2020   Procedure:  COLONOSCOPY WITH PROPOFOL;  Surgeon: Lucilla Lame, MD;  Location: [facility]COPY;  Service: Endoscopy;  Laterality: N/A;   CYSTOSCOPY WITH HOLMIUM LASER LITHOTRIPSY  2001   kidney stones removed, [facility]   LARYNGOSCOPY Right 04/01/2017   Procedure: SUSPENSION LARYNGOSCOPY WITH MICROFLAP EXCISION;  Surgeon: Carloyn Manner, MD;  Location: [facility];  Service: ENT;  Laterality: Right;   sinus surgery   2002   [place]; removal of polyps   TONSILLECTOMY  1954   Tubular adenoma removed  06/24/2006    Family History  Problem Relation Age of Onset   Lung cancer Mother    Heart disease Father     Social History Social History   Tobacco Use   Smoking status: Every Day    Packs/day: 1.00    Years: 50.00    Total pack years: 50.00    Types: Cigarettes    Start date: 1973   Smokeless tobacco: Never   Tobacco comments:    started age 25 yo.previously smoked carton a week    3/4 of a pack a day. 04/21/2022 khj    Quit smoking 04/26/2022 khj  Vaping Use   Vaping Use: Never used  Substance Use Topics   Alcohol use: Not Currently    Alcohol/week: 0.0 standard drinks of alcohol   Drug use: No    Current Outpatient Medications  Medication Sig Dispense Refill   aspirin 81 MG tablet Take 81 mg by mouth daily.     buPROPion (WELLBUTRIN SR) 150 MG 12 hr tablet 1 tablet daily for 3 days, then 1 tablet twice daily. Stop smoking 14 days after starting medication 60 tablet 3   diltiazem (CARDIZEM CD) 120 MG 24 hr capsule Take 1 capsule (120 mg total) by mouth daily. 90 capsule 3   fluticasone (FLONASE) 50 MCG/ACT nasal spray Place 2 sprays into both nostrils every morning.      hydrochlorothiazide (HYDRODIURIL) 25 MG tablet Take 1 tablet (25 mg total) by mouth daily. 90 tablet 3   KLOR-CON M20 20 MEQ tablet TAKE 1 TABLET BY MOUTH EVERY DAY (Patient taking differently: Take 20 mEq by mouth daily.) 90 tablet 4   Multiple Vitamins-Minerals (PRESERVISION AREDS 2 PO) Take 2 capsules by mouth every  morning.      omeprazole (PRILOSEC) 20 MG capsule Take 20 mg by mouth daily before breakfast.     pravastatin (PRAVACHOL) 20 MG tablet Take 1 tablet (20 mg total) by mouth every evening. 30 tablet 3   ramelteon (ROZEREM) 8 MG tablet Take 1 tablet (8 mg total) by mouth at bedtime. 30 tablet 2   sertraline (ZOLOFT) 100 MG  tablet Take 1 tablet (100 mg total) by mouth daily. 90 tablet 1   sildenafil (VIAGRA) 50 MG tablet Take 1 tablet (50 mg total) by mouth daily as needed for erectile dysfunction. 30 tablet 2   tamsulosin (FLOMAX) 0.4 MG CAPS capsule Take 1 capsule (0.4 mg total) by mouth daily after supper. 30 capsule 1   No current facility-administered medications for this visit.    Allergies  Allergen Reactions   Penicillins Anaphylaxis and Swelling    Did it involve swelling of the face/tongue/throat, SOB, or low BP? Yes Did it involve sudden or severe rash/hives, skin peeling, or any reaction on the inside of your mouth or nose? No Did you need to seek medical attention at a hospital or doctor's office? No When did it last happen?      10 + years ago If all above answers are "NO", may proceed with cephalosporin use.    Trazodone And Nefazodone     Review of Systems  Constitutional:  Negative for activity change, appetite change and unexpected weight change.  HENT:  Negative for trouble swallowing and voice change.   Eyes:  Positive for visual disturbance (Macular degeneration).  Respiratory:  Positive for cough. Negative for shortness of breath and wheezing.   Cardiovascular:  Positive for palpitations. Negative for chest pain and leg swelling.  Gastrointestinal:  Positive for abdominal pain (Reflux).  Genitourinary:  Positive for frequency. Negative for dysuria.  Musculoskeletal:  Positive for arthralgias. Negative for myalgias.  Neurological:  Positive for dizziness.  Hematological:  Negative for adenopathy. Does not bruise/bleed easily.  All other systems reviewed and are  negative.   BP 118/73 (BP Location: Left Arm, Patient Position: Sitting)   Pulse 75   Resp 20   Ht 5\' 10"  (1.778 m)   Wt 195 lb (88.5 kg)   SpO2 96% Comment: RA  BMI 27.98 kg/m  Physical Exam Vitals reviewed.  Constitutional:      General: He is not in acute distress.    Appearance: Normal appearance.  HENT:     Head: Normocephalic and atraumatic.  Eyes:     General: No scleral icterus.    Extraocular Movements: Extraocular movements intact.  Cardiovascular:     Rate and Rhythm: Normal rate and regular rhythm.     Heart sounds: Murmur (2/6 systolic) heard.  Pulmonary:     Effort: Pulmonary effort is normal. No respiratory distress.     Breath sounds: Normal breath sounds. No wheezing or rales.  Abdominal:     General: There is no distension.     Palpations: Abdomen is soft.     Tenderness: There is no abdominal tenderness.  Skin:    General: Skin is warm and dry.  Neurological:     General: No focal deficit present.     Mental Status: He is alert and oriented to person, place, and time.     Cranial Nerves: No cranial nerve deficit.     Motor: No weakness.    Diagnostic Tests: CT CHEST WITHOUT CONTRAST   TECHNIQUE: Multidetector CT imaging of the chest was performed following the standard protocol without IV contrast.   RADIATION DOSE REDUCTION: This exam was performed according to the departmental dose-optimization program which includes automated exposure control, adjustment of the mA and/or kV according to patient size and/or use of iterative reconstruction technique.   COMPARISON:  Chest radiographs done on 03/14/2022   FINDINGS: Cardiovascular: Scattered coronary artery calcifications are seen.   Mediastinum/Nodes: There are few slightly  enlarged lymph nodes and mediastinum measuring up to 9 mm in short axis.   Lungs/Pleura: In image 35 of series 3, there is 2 x 1.7 cm noncalcified nodule in the medial right upper lobe. There are 2-3 satellite nodules  anterior and superior to the larger nodule measuring up to 8 mm in diameter. In image 55 of CC, there is 5 x 8 mm nodule in right upper lobe. There are faint ground-glass densities in mid and lower lung fields. There is no focal consolidation. There is no pleural effusion or pneumothorax.   Upper Abdomen: There is increased density in gallbladder suggesting gallbladder stones. There are a few subcentimeter nodes along the lesser curvature aspect of stomach and between the portal vein and inferior vena cava. There is mild hyperplasia of left adrenal.   Musculoskeletal: No acute findings are seen.   IMPRESSION: There is 2 cm noncalcified nodule in right upper lobe. There are other smaller nodules in right upper lobe measuring up to 8 mm in size. Findings suggest possible malignant neoplasm in right upper lobe with metastatic nodules. Follow-up PET-CT and tissue sampling should be considered.   Subcentimeter nodes are seen in mediastinum and upper abdomen. Coronary artery calcifications are seen. Gallbladder stones.     Electronically Signed   By: Elmer Picker M.D.   On: 03/15/2022 09:11 NUCLEAR MEDICINE PET SKULL BASE TO THIGH   TECHNIQUE: 10.96 mCi F-18 FDG was injected intravenously. Full-ring PET imaging was performed from the skull base to thigh after the radiotracer. CT data was obtained and used for attenuation correction and anatomic localization.   Fasting blood glucose: 117 mg/dl   COMPARISON:  Chest CT March 15, 2022   FINDINGS: Mediastinal blood pool activity: SUV max 1.43   Liver activity: SUV max NA   NECK: No hypermetabolic cervical adenopathy.   Asymmetric mild hypermetabolic activity in the left posterior nasopharynx on axial fused image 27. Without discrete CT correlate with a max SUV of 2.84.   Incidental CT findings: None.   CHEST: Hypermetabolic lobular right upper lobe pulmonary nodule measures 2 cm on image 87/2 with a max SUV of  12.9.   Two adjacent satellite nodules which measure up to 8 mm on image 84/2 which demonstrated a max SUV of 2.4.   No hypermetabolic thoracic adenopathy.   Incidental CT findings: Coronary artery calcifications. Aortic atherosclerosis.   ABDOMEN/PELVIS: No abnormal hypermetabolic activity within the liver, pancreas, adrenal glands, or spleen.   No hypermetabolic lymph nodes in the abdomen or pelvis.   Short segment of hypermetabolic activity in the ascending colon with associated wall thickening versus underdistention on axial image 22/2 and a max SUV of 6.5.   Incidental CT findings: Nonobstructive 6 mm right renal calculus. Aortic atherosclerosis.   SKELETON: No focal hypermetabolic activity to suggest skeletal metastasis.   Incidental CT findings: Multilevel degenerative change of the spine.   IMPRESSION: 1. Hypermetabolic 2 cm lobular right upper lobe pulmonary nodule, compatible with primary bronchogenic neoplasm or less likely metastatic disease, suggest further evaluation with direct tissue sampling. 2. Two adjacent mildly metabolic satellite pulmonary nodules measuring up to 8 mm are suspicious for disease involvement. 3. No hypermetabolic thoracic adenopathy. 4. No convincing evidence of hypermetabolic distant metastatic disease. 5. Short segment hypermetabolic activity in the ascending colon with associated wall thickening versus underdistention, recommend correlation with colonoscopy. 6. Asymmetric mild hypermetabolic activity in the left posterior nasopharynx without discrete CT correlate, suggest correlation with direct visualization. 7.  Aortic Atherosclerosis (ICD10-I70.0).  Electronically Signed   By: Dahlia Bailiff M.D.   On: 04/10/2022 09:44  I personally reviewed the CT and PET/CT images.  There is a 2 cm right upper lobe lung nodule that is markedly hypermetabolic. Mild activity in 2 satellite nodules.  No adenopathy or evidence of distant  metastases.   Echocardiogram 03/15/2022 IMPRESSIONS     1. Left ventricular ejection fraction, by estimation, is 60 to 65%. The  left ventricle has normal function. The left ventricle has no regional  wall motion abnormalities. Left ventricular diastolic parameters are  indeterminate.   2. Right ventricular systolic function is normal. The right ventricular  size is normal. There is normal pulmonary artery systolic pressure. The  estimated right ventricular systolic pressure is 09.9 mmHg.   3. The mitral valve is normal in structure. No evidence of mitral valve  regurgitation. No evidence of mitral stenosis.   4. The aortic valve is tricuspid. Aortic valve regurgitation is not  visualized. Aortic valve sclerosis/calcification is present, without any  evidence of aortic stenosis.   5. The inferior vena cava is normal in size with greater than 50%  respiratory variability, suggesting right atrial pressure of 3 mmHg.    04/23/2022 Pharmacological myocardial perfusion imaging study with no significant  ischemia Normal wall motion, EF estimated at 65% No EKG changes concerning for ischemia at peak stress or in recovery. 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  Recently hospitalized with dizziness.  Felt to be cardiac in origin due to bradycardia.  Does have a history of intermittent atrial fibrillation.  While hospitalized was found to have a right upper lobe lung nodule.  Hypermetabolic on PET.  Biopsy shows carcinoma.  Right upper lobe lung nodule-differential diagnosis includes primary bronchogenic carcinoma versus metastatic laryngeal cancer.   Given the early stage of his laryngeal cancer and time interval of 6 years this mass most likely is a new primary bronchogenic carcinoma.  I think he should be given the benefit of the doubt and treated as such.  Clinical stage is 1A (T1, N0) versus 2B (T3, N0) depending on whether or not satellite nodules are in fact cancerous.  We discussed potential options for treatment including radiation versus surgical resection.  He understands a surgical resection gives the best chance, but no guarantee of a cure.  We discussed the relative advantages and disadvantages of each approach.  I informed him of the general nature of the surgical procedure which would be a robotic assisted right upper lobectomy.  He understands the need for general anesthesia, the incisions to be used, the use of the surgical robot, the use of drains to postoperatively, the expected hospital stay, and the overall recovery.  I informed him of the indications, risks, benefits, and alternatives.  He understands the risks include, but are not limited to death, MI, DVT, PE, bleeding, possible need for transfusion, infection, prolonged air leak, cardiac arrhythmias, recurrent phrenic nerve dysfunction, as well as possibility of other unforeseeable complications.  Atrial fibrillation-on rate control with diltiazem.  No anticoagulation.  Likely to have postoperative atrial fibrillation.  He currently is uncertain as to whether to proceed with surgical resection or radiation.  He wants some time to think this over and  talk with his family.  I encouraged him to reach out to any of his physicians if he has any additional questions.  Tobacco abuse-emphasized importance of tobacco cessation  Plan: He will think over his options and call and let us know his decision.  Melrose Nakayama, MD [facility] [phone]

## 2022-05-06 NOTE — Progress Notes (Addendum)
PCP is Birdie Sons, MD Referring Provider is Tyler Pita, MD  Chief Complaint  Patient presents with   Lung Lesion    CT chest 9/17, PET 10/11, Spirometry 10/24, ENB 10/30    HPI: Jeffrey Torres is sent for consultation regarding a right upper lobe lung mass biopsy positive for carcinoma.  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  He was admitted to the hospital at [facility] on 03/14/2022 with the dizziness.  During his work-up he was found to have a right upper lobe lung nodule.  That was confirmed with CT.  Dizziness was felt to be cardiac in origin.  He went home after 2 days.  He had a cardiac work-up as an outpatient which included an echocardiogram which showed normal left-ventricular function.  A stress perfusion study was negative for ischemia.  PET/CT showed the right upper lobe nodule was hypermetabolic.  There were 2 mildly hypermetabolic satellite nodules.  He underwent robotic bronchoscopy.  Biopsies showed carcinoma.  It could not be further classified.  He has about a 50-pack-year history of smoking although he did quit for about 5 years after he was first diagnosed with laryngeal cancer.  Currently smoking just less than a pack a day.  He has a prescription for Wellbutrin but has not set a quit date yet.  He is retired because of his vision with macular degeneration.  Otherwise he says he would still be working.  He can walk long distances without stopping or getting short of breath.  He can walk up a flight of stairs without stopping.  No change in appetite or weight loss.  No unusual headaches or seizures.  Denies chest pain, pressure, or tightness.  Zubrod Score: At the time of surgery this patient's most appropriate activity status/level should be described as: []     0    Normal activity, no symptoms [x]     1     Restricted in physical strenuous activity but ambulatory, able to do out light work []     2    Ambulatory and capable of self care, unable to do work activities, up and about >50 % of waking hours                              []     3    Only limited self care, in bed greater than 50% of waking hours []     4    Completely disabled, no self care, confined to bed or chair []     5    Moribund  Past Medical History:  Diagnosis Date   Acute metabolic encephalopathy 47/82/9562   Anxiety    Arthritis    Atrial fibrillation (HCC)    Cancer (HCC)    larynx ca, surgery and radiation   GERD (gastroesophageal reflux disease)    Heart murmur    Hernia, inguinal, bilateral    History of chicken pox    History of kidney stones    History of measles    History of mumps    History of shingles    HOH (hard of hearing)    Macular degeneration of both eyes    Motion sickness    ocean boat   Pneumonia due to COVID-19 virus 07/31/2020   PVC (premature ventricular contraction)  Past Surgical History:  Procedure Laterality Date   CARDIAC CATHETERIZATION  2010   normal per patient report   CATARACT EXTRACTION W/PHACO Right 07/18/2019   Procedure: CATARACT EXTRACTION PHACO AND INTRAOCULAR LENS PLACEMENT ([facility]) RIGHT 4.54 00:29.7;  Surgeon: Birder Robson, MD;  Location: [facility];  Service: Ophthalmology;  Laterality: Right;   CATARACT EXTRACTION W/PHACO Left 08/08/2019   Procedure: CATARACT EXTRACTION PHACO AND INTRAOCULAR LENS PLACEMENT ([place]) LEFT;  Surgeon: Birder Robson, MD;  Location: [facility];  Service: Ophthalmology;  Laterality: Left;  Lot #[id number] H [place]: 00:34.5 CDE: 4.75   COLONOSCOPY  06/24/2006   Dr. Bary Castilla. Multiple benign appearing 43mm polyps in the cecum and in the rectum. -Three 67mm polyps in the transversed colon, in the tranverse colon, proximal and in the distal transverse colon. Resected and retrieved.   COLONOSCOPY WITH PROPOFOL N/A 03/12/2020   Procedure:  COLONOSCOPY WITH PROPOFOL;  Surgeon: Lucilla Lame, MD;  Location: [facility];  Service: Endoscopy;  Laterality: N/A;   CYSTOSCOPY WITH HOLMIUM LASER LITHOTRIPSY  2001   kidney stones removed, [facility]   LARYNGOSCOPY Right 04/01/2017   Procedure: SUSPENSION LARYNGOSCOPY WITH MICROFLAP EXCISION;  Surgeon: Carloyn Manner, MD;  Location: [facility];  Service: ENT;  Laterality: Right;   sinus surgery   2002   [facility]; removal of polyps   TONSILLECTOMY  1954   Tubular adenoma removed  06/24/2006    Family History  Problem Relation Age of Onset   Lung cancer Mother    Heart disease Father     Social History Social History   Tobacco Use   Smoking status: Every Day    Packs/day: 1.00    Years: 50.00    Total pack years: 50.00    Types: Cigarettes    Start date: 1973   Smokeless tobacco: Never   Tobacco comments:    started age 89 yo.previously smoked carton a week    3/4 of a pack a day. 04/21/2022 khj    Quit smoking 04/26/2022 khj  Vaping Use   Vaping Use: Never used  Substance Use Topics   Alcohol use: Not Currently    Alcohol/week: 0.0 standard drinks of alcohol   Drug use: No    Current Outpatient Medications  Medication Sig Dispense Refill   aspirin 81 MG tablet Take 81 mg by mouth daily.     buPROPion (WELLBUTRIN SR) 150 MG 12 hr tablet 1 tablet daily for 3 days, then 1 tablet twice daily. Stop smoking 14 days after starting medication 60 tablet 3   diltiazem (CARDIZEM CD) 120 MG 24 hr capsule Take 1 capsule (120 mg total) by mouth daily. 90 capsule 3   fluticasone (FLONASE) 50 MCG/ACT nasal spray Place 2 sprays into both nostrils every morning.      hydrochlorothiazide (HYDRODIURIL) 25 MG tablet Take 1 tablet (25 mg total) by mouth daily. 90 tablet 3   KLOR-CON M20 20 MEQ tablet TAKE 1 TABLET BY MOUTH EVERY DAY (Patient taking differently: Take 20 mEq by mouth daily.) 90 tablet 4   Multiple Vitamins-Minerals (PRESERVISION AREDS 2 PO) Take 2 capsules by mouth every  morning.      omeprazole (PRILOSEC) 20 MG capsule Take 20 mg by mouth daily before breakfast.     pravastatin (PRAVACHOL) 20 MG tablet Take 1 tablet (20 mg total) by mouth every evening. 30 tablet 3   ramelteon (ROZEREM) 8 MG tablet Take 1 tablet (8 mg total) by mouth at bedtime. 30 tablet 2   sertraline (ZOLOFT) 100 MG  tablet Take 1 tablet (100 mg total) by mouth daily. 90 tablet 1   sildenafil (VIAGRA) 50 MG tablet Take 1 tablet (50 mg total) by mouth daily as needed for erectile dysfunction. 30 tablet 2   tamsulosin (FLOMAX) 0.4 MG CAPS capsule Take 1 capsule (0.4 mg total) by mouth daily after supper. 30 capsule 1   No current facility-administered medications for this visit.    Allergies  Allergen Reactions   Penicillins Anaphylaxis and Swelling    Did it involve swelling of the face/tongue/throat, SOB, or low BP? Yes Did it involve sudden or severe rash/hives, skin peeling, or any reaction on the inside of your mouth or nose? No Did you need to seek medical attention at a hospital or doctor's office? No When did it last happen?      10 + years ago If all above answers are "NO", may proceed with cephalosporin use.    Trazodone And Nefazodone     Review of Systems  Constitutional:  Negative for activity change, appetite change and unexpected weight change.  HENT:  Negative for trouble swallowing and voice change.   Eyes:  Positive for visual disturbance (Macular degeneration).  Respiratory:  Positive for cough. Negative for shortness of breath and wheezing.   Cardiovascular:  Positive for palpitations. Negative for chest pain and leg swelling.  Gastrointestinal:  Positive for abdominal pain (Reflux).  Genitourinary:  Positive for frequency. Negative for dysuria.  Musculoskeletal:  Positive for arthralgias. Negative for myalgias.  Neurological:  Positive for dizziness.  Hematological:  Negative for adenopathy. Does not bruise/bleed easily.  All other systems reviewed and are  negative.   BP 118/73 (BP Location: Left Arm, Patient Position: Sitting)   Pulse 75   Resp 20   Ht 5\' 10"  (1.778 m)   Wt 195 lb (88.5 kg)   SpO2 96% Comment: RA  BMI 27.98 kg/m  Physical Exam Vitals reviewed.  Constitutional:      General: He is not in acute distress.    Appearance: Normal appearance.  HENT:     Head: Normocephalic and atraumatic.  Eyes:     General: No scleral icterus.    Extraocular Movements: Extraocular movements intact.  Cardiovascular:     Rate and Rhythm: Normal rate and regular rhythm.     Heart sounds: Murmur (2/6 systolic) heard.  Pulmonary:     Effort: Pulmonary effort is normal. No respiratory distress.     Breath sounds: Normal breath sounds. No wheezing or rales.  Abdominal:     General: There is no distension.     Palpations: Abdomen is soft.     Tenderness: There is no abdominal tenderness.  Skin:    General: Skin is warm and dry.  Neurological:     General: No focal deficit present.     Mental Status: He is alert and oriented to person, place, and time.     Cranial Nerves: No cranial nerve deficit.     Motor: No weakness.    Diagnostic Tests: CT CHEST WITHOUT CONTRAST   TECHNIQUE: Multidetector CT imaging of the chest was performed following the standard protocol without IV contrast.   RADIATION DOSE REDUCTION: This exam was performed according to the departmental dose-optimization program which includes automated exposure control, adjustment of the mA and/or kV according to patient size and/or use of iterative reconstruction technique.   COMPARISON:  Chest radiographs done on 03/14/2022   FINDINGS: Cardiovascular: Scattered coronary artery calcifications are seen.   Mediastinum/Nodes: There are few slightly  enlarged lymph nodes and mediastinum measuring up to 9 mm in short axis.   Lungs/Pleura: In image 35 of series 3, there is 2 x 1.7 cm noncalcified nodule in the medial right upper lobe. There are 2-3 satellite nodules  anterior and superior to the larger nodule measuring up to 8 mm in diameter. In image 55 of CC, there is 5 x 8 mm nodule in right upper lobe. There are faint ground-glass densities in mid and lower lung fields. There is no focal consolidation. There is no pleural effusion or pneumothorax.   Upper Abdomen: There is increased density in gallbladder suggesting gallbladder stones. There are a few subcentimeter nodes along the lesser curvature aspect of stomach and between the portal vein and inferior vena cava. There is mild hyperplasia of left adrenal.   Musculoskeletal: No acute findings are seen.   IMPRESSION: There is 2 cm noncalcified nodule in right upper lobe. There are other smaller nodules in right upper lobe measuring up to 8 mm in size. Findings suggest possible malignant neoplasm in right upper lobe with metastatic nodules. Follow-up PET-CT and tissue sampling should be considered.   Subcentimeter nodes are seen in mediastinum and upper abdomen. Coronary artery calcifications are seen. Gallbladder stones.     Electronically Signed   By: Elmer Picker M.D.   On: 03/15/2022 09:11 NUCLEAR MEDICINE PET SKULL BASE TO THIGH   TECHNIQUE: 10.96 mCi F-18 FDG was injected intravenously. Full-ring PET imaging was performed from the skull base to thigh after the radiotracer. CT data was obtained and used for attenuation correction and anatomic localization.   Fasting blood glucose: 117 mg/dl   COMPARISON:  Chest CT March 15, 2022   FINDINGS: Mediastinal blood pool activity: SUV max 1.43   Liver activity: SUV max NA   NECK: No hypermetabolic cervical adenopathy.   Asymmetric mild hypermetabolic activity in the left posterior nasopharynx on axial fused image 27. Without discrete CT correlate with a max SUV of 2.84.   Incidental CT findings: None.   CHEST: Hypermetabolic lobular right upper lobe pulmonary nodule measures 2 cm on image 87/2 with a max SUV of  12.9.   Two adjacent satellite nodules which measure up to 8 mm on image 84/2 which demonstrated a max SUV of 2.4.   No hypermetabolic thoracic adenopathy.   Incidental CT findings: Coronary artery calcifications. Aortic atherosclerosis.   ABDOMEN/PELVIS: No abnormal hypermetabolic activity within the liver, pancreas, adrenal glands, or spleen.   No hypermetabolic lymph nodes in the abdomen or pelvis.   Short segment of hypermetabolic activity in the ascending colon with associated wall thickening versus underdistention on axial image 22/2 and a max SUV of 6.5.   Incidental CT findings: Nonobstructive 6 mm right renal calculus. Aortic atherosclerosis.   SKELETON: No focal hypermetabolic activity to suggest skeletal metastasis.   Incidental CT findings: Multilevel degenerative change of the spine.   IMPRESSION: 1. Hypermetabolic 2 cm lobular right upper lobe pulmonary nodule, compatible with primary bronchogenic neoplasm or less likely metastatic disease, suggest further evaluation with direct tissue sampling. 2. Two adjacent mildly metabolic satellite pulmonary nodules measuring up to 8 mm are suspicious for disease involvement. 3. No hypermetabolic thoracic adenopathy. 4. No convincing evidence of hypermetabolic distant metastatic disease. 5. Short segment hypermetabolic activity in the ascending colon with associated wall thickening versus underdistention, recommend correlation with colonoscopy. 6. Asymmetric mild hypermetabolic activity in the left posterior nasopharynx without discrete CT correlate, suggest correlation with direct visualization. 7.  Aortic Atherosclerosis (ICD10-I70.0).  Electronically Signed   By: Dahlia Bailiff M.D.   On: 04/10/2022 09:44  I personally reviewed the CT and PET/CT images.  There is a 2 cm right upper lobe lung nodule that is markedly hypermetabolic. Mild activity in 2 satellite nodules.  No adenopathy or evidence of distant  metastases.   Echocardiogram 03/15/2022 IMPRESSIONS     1. Left ventricular ejection fraction, by estimation, is 60 to 65%. The  left ventricle has normal function. The left ventricle has no regional  wall motion abnormalities. Left ventricular diastolic parameters are  indeterminate.   2. Right ventricular systolic function is normal. The right ventricular  size is normal. There is normal pulmonary artery systolic pressure. The  estimated right ventricular systolic pressure is 92.0 mmHg.   3. The mitral valve is normal in structure. No evidence of mitral valve  regurgitation. No evidence of mitral stenosis.   4. The aortic valve is tricuspid. Aortic valve regurgitation is not  visualized. Aortic valve sclerosis/calcification is present, without any  evidence of aortic stenosis.   5. The inferior vena cava is normal in size with greater than 50%  respiratory variability, suggesting right atrial pressure of 3 mmHg.    04/23/2022 Pharmacological myocardial perfusion imaging study with no significant  ischemia Normal wall motion, EF estimated at 65% No EKG changes concerning for ischemia at peak stress or in recovery. 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  Recently hospitalized with dizziness.  Felt to be cardiac in origin due to bradycardia.  Does have a history of intermittent atrial fibrillation.  While hospitalized was found to have a right upper lobe lung nodule.  Hypermetabolic on PET.  Biopsy shows carcinoma.  Right upper lobe lung nodule-differential diagnosis includes primary bronchogenic carcinoma versus metastatic laryngeal cancer.   Given the early stage of his laryngeal cancer and time interval of 6 years this mass most likely is a new primary bronchogenic carcinoma.  I think he should be given the benefit of the doubt and treated as such.  Clinical stage is 1A (T1, N0) versus 2B (T3, N0) depending on whether or not satellite nodules are in fact cancerous.  We discussed potential options for treatment including radiation versus surgical resection.  He understands a surgical resection gives the best chance, but no guarantee of a cure.  We discussed the relative advantages and disadvantages of each approach.  I informed him of the general nature of the surgical procedure which would be a robotic assisted right upper lobectomy.  He understands the need for general anesthesia, the incisions to be used, the use of the surgical robot, the use of drains to postoperatively, the expected hospital stay, and the overall recovery.  I informed him of the indications, risks, benefits, and alternatives.  He understands the risks include, but are not limited to death, MI, DVT, PE, bleeding, possible need for transfusion, infection, prolonged air leak, cardiac arrhythmias, recurrent phrenic nerve dysfunction, as well as possibility of other unforeseeable complications.  Atrial fibrillation-on rate control with diltiazem.  No anticoagulation.  Likely to have postoperative atrial fibrillation.  He currently is uncertain as to whether to proceed with surgical resection or radiation.  He wants some time to think this over and  talk with his family.  I encouraged him to reach out to any of his physicians if he has any additional questions.  Tobacco abuse-emphasized importance of tobacco cessation  Plan: He will think over his options and call and let us know his decision.  Melrose Nakayama, MD [facility] [phone]

## 2022-05-07 ENCOUNTER — Other Ambulatory Visit: Payer: Medicare HMO

## 2022-05-08 ENCOUNTER — Encounter: Payer: Self-pay | Admitting: Cardiology

## 2022-05-08 ENCOUNTER — Ambulatory Visit: Payer: Medicare HMO | Attending: Cardiology | Admitting: Cardiology

## 2022-05-08 VITALS — BP 130/58 | HR 58 | Ht 70.0 in | Wt 200.4 lb

## 2022-05-08 DIAGNOSIS — I2510 Atherosclerotic heart disease of native coronary artery without angina pectoris: Secondary | ICD-10-CM

## 2022-05-08 DIAGNOSIS — I10 Essential (primary) hypertension: Secondary | ICD-10-CM | POA: Diagnosis not present

## 2022-05-08 DIAGNOSIS — F17200 Nicotine dependence, unspecified, uncomplicated: Secondary | ICD-10-CM

## 2022-05-08 DIAGNOSIS — R69 Illness, unspecified: Secondary | ICD-10-CM | POA: Diagnosis not present

## 2022-05-08 DIAGNOSIS — R911 Solitary pulmonary nodule: Secondary | ICD-10-CM | POA: Diagnosis not present

## 2022-05-08 NOTE — Progress Notes (Signed)
Cardiology Clinic Note   Patient Name: Jeffrey Torres Date of Encounter: 05/08/2022  Primary Care Provider:  Birdie Sons, MD Primary Cardiologist:  Kate Sable, MD  Patient Profile    76 year old male with past medical history of coronary artery disease, hypertension, OSA, former smoker x35+ years, who presents today for follow-up.  Past Medical History    Past Medical History:  Diagnosis Date   Acute metabolic encephalopathy 40/98/1191   Anxiety    Arthritis    Atrial fibrillation (HCC)    Cancer (HCC)    larynx ca, surgery and radiation   GERD (gastroesophageal reflux disease)    Heart murmur    Hernia, inguinal, bilateral    History of chicken pox    History of kidney stones    History of measles    History of mumps    History of shingles    HOH (hard of hearing)    Macular degeneration of both eyes    Motion sickness    ocean boat   Pneumonia due to COVID-19 virus 07/31/2020   PVC (premature ventricular contraction)    Past Surgical History:  Procedure Laterality Date   CARDIAC CATHETERIZATION  2010   normal per patient report   CATARACT EXTRACTION W/PHACO Right 07/18/2019   Procedure: CATARACT EXTRACTION PHACO AND INTRAOCULAR LENS PLACEMENT ([place]) RIGHT 4.54 00:29.7;  Surgeon: Birder Robson, MD;  Location: [facility];  Service: Ophthalmology;  Laterality: Right;   CATARACT EXTRACTION W/PHACO Left 08/08/2019   Procedure: CATARACT EXTRACTION PHACO AND INTRAOCULAR LENS PLACEMENT ([place]) LEFT;  Surgeon: Birder Robson, MD;  Location: [facility];  Service: Ophthalmology;  Laterality: Left;  Lot #[id number] H [place]: 00:34.5 CDE: 4.75   COLONOSCOPY  06/24/2006   Dr. Bary Castilla. Multiple benign appearing 27mm polyps in the cecum and in the rectum. -Three 68mm polyps in the transversed colon, in the tranverse colon, proximal and in the distal transverse colon. Resected and retrieved.   COLONOSCOPY WITH PROPOFOL N/A 03/12/2020   Procedure: COLONOSCOPY WITH  PROPOFOL;  Surgeon: Lucilla Lame, MD;  Location: [facility] ENDOSCOPY;  Service: Endoscopy;  Laterality: N/A;   CYSTOSCOPY WITH HOLMIUM LASER LITHOTRIPSY  2001   kidney stones removed, [facility]   LARYNGOSCOPY Right 04/01/2017   Procedure: SUSPENSION LARYNGOSCOPY WITH MICROFLAP EXCISION;  Surgeon: Carloyn Manner, MD;  Location: [facility];  Service: ENT;  Laterality: Right;   sinus surgery   2002   [place]; removal of polyps   TONSILLECTOMY  1954   Tubular adenoma removed  06/24/2006    Allergies  Allergies  Allergen Reactions   Penicillins Anaphylaxis and Swelling    Did it involve swelling of the face/tongue/throat, SOB, or low BP? Yes Did it involve sudden or severe rash/hives, skin peeling, or any reaction on the inside of your mouth or nose? No Did you need to seek medical attention at a hospital or doctor's office? No When did it last happen?      10 + years ago If all above answers are "NO", may proceed with cephalosporin use.    Trazodone And Nefazodone     History of Present Illness    Triton Heidrich. Kryder is a 76 year old male with a history of coronary artery disease (LAD and RCA calcifications on chest CT 03/2022, hypertension, OSA, former smoker x35+ years.  He was recently evaluated in the hospital 03/15/2022 due to elevated blood pressure and dizziness.  Blood pressure in the hospital.  Normal, elevated blood pressures deemed erroneous.  He had been  compliant with medications including Cardizem and HCTZ as previously recommended.  Echocardiogram done 02/2022 showed normal systolic function with EF 60-65%.  Chest CT 03/15/2022 showed LAD and RCA calcifications.  Right upper lobe pulmonary nodule also noted.  Follow-up PET scan showed nodule was hypermetabolic suggesting neoplasm.  A biopsy was being considered at that time.    He was last seen in clinic on 04/17/2022 and stated that he was feeling well.  Blood pressures been controlled on current medication regimen.  He had denied any  chest discomfort.  He was started on aspirin 81 mg daily Pravachol 20 mg daily and was scheduled for a [employer].  MPI done 04/23/2022 revealed no significant ischemia, low risk study, normal ejection fraction.  He had a lung biopsy performed 16/03/9603 was complicated by a pneumothorax postprocedure.  He had a pneumothorax catheter that was placed to Heimlich valve.  He stated that he had to keep that catheter in and have chest x-rays for 3 days in a row priorTo being able to have the catheter removed.  He returns to clinic today  stating that he has been doing fairly well since that time.  He has followed up with Dr. Roxan Hockey for possible VATS procedure.  He denies any chest pain, worsening shortness of breath, palpitations, peripheral edema.  He states that with Wellbutrin he has been able to stop smoking.  He is currently in the process of finding someone who can stay with him at his home postoperative so he can have his surgery done as early as after Thanksgiving.  Home Medications    Current Outpatient Medications  Medication Sig Dispense Refill   aspirin 81 MG tablet Take 81 mg by mouth daily.     buPROPion (WELLBUTRIN SR) 150 MG 12 hr tablet 1 tablet daily for 3 days, then 1 tablet twice daily. Stop smoking 14 days after starting medication 60 tablet 3   diltiazem (CARDIZEM CD) 120 MG 24 hr capsule Take 1 capsule (120 mg total) by mouth daily. 90 capsule 3   fluticasone (FLONASE) 50 MCG/ACT nasal spray Place 2 sprays into both nostrils every morning.      hydrochlorothiazide (HYDRODIURIL) 25 MG tablet Take 1 tablet (25 mg total) by mouth daily. 90 tablet 3   KLOR-CON M20 20 MEQ tablet TAKE 1 TABLET BY MOUTH EVERY DAY (Patient taking differently: Take 20 mEq by mouth daily.) 90 tablet 4   Multiple Vitamins-Minerals (PRESERVISION AREDS 2 PO) Take 2 capsules by mouth every morning.      omeprazole (PRILOSEC) 20 MG capsule Take 20 mg by mouth daily before breakfast.     pravastatin  (PRAVACHOL) 20 MG tablet Take 1 tablet (20 mg total) by mouth every evening. 30 tablet 3   sertraline (ZOLOFT) 100 MG tablet Take 1 tablet (100 mg total) by mouth daily. 90 tablet 1   sildenafil (VIAGRA) 50 MG tablet Take 1 tablet (50 mg total) by mouth daily as needed for erectile dysfunction. 30 tablet 2   tamsulosin (FLOMAX) 0.4 MG CAPS capsule Take 1 capsule (0.4 mg total) by mouth daily after supper. 30 capsule 1   ramelteon (ROZEREM) 8 MG tablet Take 1 tablet (8 mg total) by mouth at bedtime. (Patient not taking: Reported on 05/08/2022) 30 tablet 2   No current facility-administered medications for this visit.     Family History    Family History  Problem Relation Age of Onset   Lung cancer Mother    Heart disease Father  He indicated that his mother is deceased. He indicated that his father is deceased. He indicated that his sister is alive. He indicated that his brother is alive.  Social History    Social History   Socioeconomic History   Marital status: Legally Separated    Spouse name: Not on file   Number of children: 2   Years of education: Not on file   Highest education level: 12th grade  Occupational History   Occupation: Truck Geophysicist/field seismologist    Comment: unemployed  Tobacco Use   Smoking status: Some Days    Packs/day: 1.00    Years: 50.00    Total pack years: 50.00    Types: Cigarettes    Start date: 1973   Smokeless tobacco: Never   Tobacco comments:    started age 49 yo.previously smoked carton a week    3/4 of a pack a day. 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   Social Determinants of Health   Financial Resource Strain: Medium Risk (07/23/2020)    Overall Financial Resource Strain (CARDIA)    Difficulty of Paying Living Expenses: Somewhat hard  Food Insecurity: No Food Insecurity (03/15/2022)   Hunger Vital Sign    Worried About Running Out of Food in the Last Year: Never true    Ran Out of Food in the Last Year: Never true  Transportation Needs: No Transportation Needs (03/15/2022)   PRAPARE - Hydrologist (Medical): No    Lack of Transportation (Non-Medical): No  Physical Activity: Inactive (07/23/2020)   Exercise Vital Sign    Days of Exercise per Week: 0 days    Minutes of Exercise per Session: 0 min  Stress: No Stress Concern Present (07/23/2020)   [place]    Feeling of Stress : Not at all  Social Connections: Socially Isolated (07/23/2020)   Social Connection and Isolation Panel [NHANES]    Frequency of Communication with Friends and Family: More than three times a week    Frequency of Social Gatherings with Friends and Family: More than three times a week    Attends Religious Services: Never    Marine scientist or Organizations: No    Attends Archivist Meetings: Never    Marital Status: Separated  Intimate Partner Violence: Not At Risk (03/15/2022)   Humiliation, Afraid, Rape, and Kick questionnaire    Fear of Current or Ex-Partner: No    Emotionally Abused: No    Physically Abused: No    Sexually Abused: No     Review of Systems    General:  No chills, fever, night sweats or weight changes.  Cardiovascular:  No chest pain, endorses chronic dyspnea on exertion, edema, orthopnea, palpitations, paroxysmal nocturnal dyspnea. Dermatological: No rash, lesions/masses Respiratory: No cough, endorses chronic dyspnea Urologic: No hematuria, dysuria Abdominal:   No nausea, vomiting, diarrhea, bright red blood per rectum, melena, or hematemesis Neurologic:  No visual changes, wkns, changes in mental status. All  other systems reviewed and are otherwise negative except as noted above.   Physical Exam  VS:  BP (!) 130/58 (BP Location: Left Arm, Patient Position: Sitting, Cuff Size: Normal)   Pulse (!) 58   Ht 5\' 10"  (1.778 m)   Wt 200 lb 6.4 oz (90.9 kg)   SpO2 97%   BMI 28.75 kg/m  , BMI Body mass index is 28.75 kg/m.     GEN: Well nourished, well developed, in no acute distress. HEENT: normal. Neck: Supple, no JVD, carotid bruits, or masses. Cardiac: RRR, no murmurs, rubs, or gallops. No clubbing, cyanosis, edema.  Radials/DP/PT 2+ and equal bilaterally.  Respiratory:  Respirations regular and unlabored, clear to auscultation on the left but is slightly diminished on the right respirations are unlabored at rest. GI: Soft, nontender, nondistended, BS + x 4. MS: no deformity or atrophy. Skin: warm and dry, no rash. Neuro:  Strength and sensation are intact. Psych: Normal affect.  Accessory Clinical Findings    ECG personally reviewed by me today-sinus bradycardia with a rate of 58 with a left axis deviation- No acute changes  Lab Results  Component Value Date   WBC 8.0 03/15/2022   HGB 15.1 03/15/2022   HCT 43.8 03/15/2022   MCV 89.6 03/15/2022   PLT 261 03/15/2022   Lab Results  Component Value Date   CREATININE 0.98 03/15/2022   BUN 12 03/15/2022   NA 131 (L) 03/15/2022   K 3.8 03/15/2022   CL 97 (L) 03/15/2022   CO2 24 03/15/2022   Lab Results  Component Value Date   ALT 20 03/15/2022   AST 30 03/15/2022   ALKPHOS 47 03/15/2022   BILITOT 0.9 03/15/2022   Lab Results  Component Value Date   CHOL 154 11/21/2021   HDL 49 11/21/2021   LDLCALC 90 11/21/2021   TRIG 76 11/21/2021   CHOLHDL 3.1 11/21/2021    Lab Results  Component Value Date   HGBA1C 5.9 (H) 11/21/2021    Assessment & Plan   1.  Coronary artery disease with RCA and LAD calcifications on chest CT.  He underwent Myoview Lexiscan which was a low risk scan.  He has been continued on aspirin 81 mg  daily and Pravachol 20 mg daily.  He remains chest pain-free today.  There are no ischemic changes noted on his EKG that revealed sinus bradycardia with a rate of 58 and a left axis deviation.  2.  Hypertension with blood pressure of 130/58 today he is continued on diltiazem 120 mg daily HCTZ 25 mg daily.  Blood pressure remained stable.  3.  He had a pulmonary biopsy which revealed right upper lobe lung mass positive for carcinoma.  He is awaiting surgery likely after Thanksgiving.  Lexiscan Myoview revealed low risk scan, EF was normal, blood pressure is adequately controlled.  Patient is asymptomatic.   4.  Tobacco abuse with 50 pack-year history of smoking although he quit for 5 years after he was first diagnosed with laryngeal cancer.  He was on Wellbutrin and is decreased to the point of not smoking today.  He has been congratulated on his efforts and continued cessation is recommended.  5.  Disposition patient return to clinic to see MD/APP in 6 months or sooner if needed.     Mr. Daily perioperative risk of a major cardiac event is 0.9% according to the Revised Cardiac Risk Index (RCRI).  Therefore, he is at low risk for perioperative complications.   His functional capacity is fair at 5.62 METs according to the Duke Activity Status Index (DASI). Recommendations: According to ACC/AHA  guidelines, no further cardiovascular testing needed.  The patient may proceed to surgery at acceptable risk.   Antiplatelet and/or Anticoagulation Recommendations: The patient should remain on Aspirin without interruption.       Kevontae Burgoon, NP 05/08/2022, 2:22 PM

## 2022-05-08 NOTE — Patient Instructions (Signed)
Medication Instructions:  No changes at this time.   *If you need a refill on your cardiac medications before your next appointment, please call your pharmacy*   Lab Work: None  If you have labs (blood work) drawn today and your tests are completely normal, you will receive your results only by: [place] (if you have MyChart) OR A paper copy in the mail If you have any lab test that is abnormal or we need to change your treatment, we will call you to review the results.   Testing/Procedures: None   Follow-Up: At [facility], you and your health needs are our priority.  As part of our continuing mission to provide you with exceptional heart care, we have created designated Provider Care Teams.  These Care Teams include your primary Cardiologist (physician) and Advanced Practice Providers (APPs -  Physician Assistants and Nurse Practitioners) who all work together to provide you with the care you need, when you need it.   Your next appointment:   6 month(s)  The format for your next appointment:   In Person  Provider:   Kate Sable, MD or Gerrie Nordmann, NP        Important Information About Sugar

## 2022-05-12 ENCOUNTER — Other Ambulatory Visit: Payer: Self-pay | Admitting: *Deleted

## 2022-05-12 ENCOUNTER — Encounter: Payer: Self-pay | Admitting: *Deleted

## 2022-05-12 DIAGNOSIS — R911 Solitary pulmonary nodule: Secondary | ICD-10-CM

## 2022-05-13 ENCOUNTER — Encounter: Payer: Self-pay | Admitting: *Deleted

## 2022-05-15 ENCOUNTER — Other Ambulatory Visit: Payer: Self-pay | Admitting: Family Medicine

## 2022-05-15 DIAGNOSIS — F17210 Nicotine dependence, cigarettes, uncomplicated: Secondary | ICD-10-CM

## 2022-05-19 ENCOUNTER — Ambulatory Visit: Payer: Medicare HMO | Admitting: Pulmonary Disease

## 2022-05-25 NOTE — Progress Notes (Addendum)
Surgical Instructions    Your procedure is scheduled on Thursday November 30.  Report to [facility] Main Entrance "A" at 5:30 A.M., then check in with the Admitting office.  Call this number if you have problems the morning of surgery:  [phone]   If you have any questions prior to your surgery date call [phone]: Open Monday-Friday 8am-4pm If you experience any cold or flu symptoms such as cough, fever, chills, shortness of breath, etc. between now and your scheduled surgery, please notify us at the above number     Remember:  Do not eat or drink after midnight the night before your surgery    Take these medicines the morning of surgery with A SIP OF WATER:  buPROPion (WELLBUTRIN SR)  cycloSPORINE (RESTASIS) if needed diltiazem (CARDIZEM CD)  fluticasone (FLONASE) 50 MCG/ACT nasal spray  omeprazole (PRILOSEC) if needed sertraline (ZOLOFT)   As of today, STOP taking any Aspirin (unless otherwise instructed by your surgeon) Aleve, Naproxen, Ibuprofen, Motrin, Advil, Goody's, BC's, all herbal medications, fish oil, and all vitamins.           Do not wear jewelry or makeup. Do not wear lotions, powders, perfumes/cologne or deodorant. Do not shave 48 hours prior to surgery.  Men may shave face and neck. Do not bring valuables to the hospital. Do not wear nail polish, gel polish, artificial nails, or any other type of covering on natural nails (fingers and toes) If you have artificial nails or gel coating that need to be removed by a nail salon, please have this removed prior to surgery. Artificial nails or gel coating may interfere with anesthesia's ability to adequately monitor your vital signs.  [facility] is not responsible for any belongings or valuables.    Do NOT Smoke (Tobacco/Vaping)  24 hours prior to your procedure  If you use a CPAP at night, you may bring your mask for your overnight stay.   Contacts, glasses, hearing aids, dentures or partials may not be worn  into surgery, please bring cases for these belongings   For patients admitted to the hospital, discharge time will be determined by your treatment team.   Patients discharged the day of surgery will not be allowed to drive home, and someone needs to stay with them for 24 hours.   SURGICAL WAITING ROOM VISITATION Patients having surgery or a procedure may have no more than 2 support people in the waiting area - these visitors may rotate.   Children under the age of 75 must have an adult with them who is not the patient. If the patient needs to stay at the hospital during part of their recovery, the visitor guidelines for inpatient rooms apply. Pre-op nurse will coordinate an appropriate time for 1 support person to accompany patient in pre-op.  This support person may not rotate.   Please refer to [email or web address].hu for the visitor guidelines for Inpatients (after your surgery is over and you are in a regular room).    Special instructions:    Oral Hygiene is also important to reduce your risk of infection.  Remember - BRUSH YOUR TEETH THE MORNING OF SURGERY WITH YOUR REGULAR TOOTHPASTE   [place]- Preparing For Surgery  Before surgery, you can play an important role. Because skin is not sterile, your skin needs to be as free of germs as possible. You can reduce the number of germs on your skin by washing with CHG (chlorahexidine gluconate) Soap before surgery.  CHG is an antiseptic cleaner  which kills germs and bonds with the skin to continue killing germs even after washing.     Please do not use if you have an allergy to CHG or antibacterial soaps. If your skin becomes reddened/irritated stop using the CHG.  Do not shave (including legs and underarms) for at least 48 hours prior to first CHG shower. It is OK to shave your face.  Please follow these instructions carefully.     Shower the NIGHT BEFORE SURGERY and the MORNING OF  SURGERY with CHG Soap.   If you chose to wash your hair, wash your hair first as usual with your normal shampoo. After you shampoo, rinse your hair and body thoroughly to remove the shampoo.  Then [employer] and genitals (private parts) with your normal soap and rinse thoroughly to remove soap.  After that Use CHG Soap as you would any other liquid soap. You can apply CHG directly to the skin and wash gently with a scrungie or a clean washcloth.   Apply the CHG Soap to your body ONLY FROM THE NECK DOWN.  Do not use on open wounds or open sores. Avoid contact with your eyes, ears, mouth and genitals (private parts). Wash Face and genitals (private parts)  with your normal soap.   Wash thoroughly, paying special attention to the area where your surgery will be performed.  Thoroughly rinse your body with warm water from the neck down.  DO NOT shower/wash with your normal soap after using and rinsing off the CHG Soap.  Pat yourself dry with a CLEAN TOWEL.  Wear CLEAN PAJAMAS to bed the night before surgery  Place CLEAN SHEETS on your bed the night before your surgery  DO NOT SLEEP WITH PETS.   Day of Surgery:  Take a shower with CHG soap. Wear Clean/Comfortable clothing the morning of surgery Do not apply any deodorants/lotions.   Remember to brush your teeth WITH YOUR REGULAR TOOTHPASTE.    If you received a COVID test during your pre-op visit, it is requested that you wear a mask when out in public, stay away from anyone that may not be feeling well, and notify your surgeon if you develop symptoms. If you have been in contact with anyone that has tested positive in the last 10 days, please notify your surgeon.    Please read over the following fact sheets that you were given.

## 2022-05-26 ENCOUNTER — Ambulatory Visit (HOSPITAL_COMMUNITY)
Admission: RE | Admit: 2022-05-26 | Discharge: 2022-05-26 | Disposition: A | Discharge status: Home / Self Care | Payer: Medicare HMO | Source: Ambulatory Visit | Attending: Thoracic Surgery (Cardiothoracic Vascular Surgery) | Admitting: Thoracic Surgery (Cardiothoracic Vascular Surgery)

## 2022-05-26 ENCOUNTER — Encounter (HOSPITAL_COMMUNITY): Payer: Self-pay

## 2022-05-26 ENCOUNTER — Encounter (HOSPITAL_COMMUNITY)
Admission: RE | Admit: 2022-05-26 | Discharge: 2022-05-26 | Disposition: A | Discharge status: Home / Self Care | Payer: Medicare HMO | Source: Ambulatory Visit | Attending: Thoracic Surgery (Cardiothoracic Vascular Surgery) | Admitting: Thoracic Surgery (Cardiothoracic Vascular Surgery)

## 2022-05-26 ENCOUNTER — Other Ambulatory Visit: Payer: Self-pay

## 2022-05-26 VITALS — BP 135/72 | HR 58 | Temp 97.5°F | Resp 18 | Ht 70.0 in | Wt 210.4 lb

## 2022-05-26 DIAGNOSIS — Z01818 Encounter for other preprocedural examination: Secondary | ICD-10-CM | POA: Insufficient documentation

## 2022-05-26 DIAGNOSIS — Z1152 Encounter for screening for COVID-19: Secondary | ICD-10-CM | POA: Insufficient documentation

## 2022-05-26 DIAGNOSIS — R911 Solitary pulmonary nodule: Secondary | ICD-10-CM

## 2022-05-26 HISTORY — DX: Essential (primary) hypertension: I10

## 2022-05-26 LAB — URINALYSIS, ROUTINE W REFLEX MICROSCOPIC
Bilirubin Urine: NEGATIVE
Glucose, UA: NEGATIVE mg/dL
Hgb urine dipstick: NEGATIVE
Ketones, ur: NEGATIVE mg/dL
Nitrite: NEGATIVE
Protein, ur: NEGATIVE mg/dL
Specific Gravity, Urine: 1.012 (ref 1.005–1.030)
pH: 5 (ref 5.0–8.0)

## 2022-05-26 LAB — BLOOD GAS, ARTERIAL
Acid-Base Excess: 3 mmol/L — ABNORMAL HIGH (ref 0.0–2.0)
Bicarbonate: 27.1 mmol/L (ref 20.0–28.0)
Drawn by: 6643
O2 Saturation: 98.3 %
Patient temperature: 37
pCO2 arterial: 39 mmHg (ref 32–48)
pH, Arterial: 7.45 (ref 7.35–7.45)
pO2, Arterial: 97 mmHg (ref 83–108)

## 2022-05-26 LAB — COMPREHENSIVE METABOLIC PANEL
ALT: 21 U/L (ref 0–44)
AST: 25 U/L (ref 15–41)
Albumin: 4 g/dL (ref 3.5–5.0)
Alkaline Phosphatase: 48 U/L (ref 38–126)
Anion gap: 14 (ref 5–15)
BUN: 20 mg/dL (ref 8–23)
CO2: 23 mmol/L (ref 22–32)
Calcium: 9.3 mg/dL (ref 8.9–10.3)
Chloride: 101 mmol/L (ref 98–111)
Creatinine, Ser: 1.36 mg/dL — ABNORMAL HIGH (ref 0.61–1.24)
GFR, Estimated: 54 mL/min — ABNORMAL LOW (ref >60)
Glucose, Bld: 78 mg/dL (ref 70–99)
Potassium: 3.9 mmol/L (ref 3.5–5.1)
Sodium: 138 mmol/L (ref 135–145)
Total Bilirubin: 0.6 mg/dL (ref 0.3–1.2)
Total Protein: 6.8 g/dL (ref 6.5–8.1)

## 2022-05-26 LAB — APTT: aPTT: 30 seconds (ref 24–36)

## 2022-05-26 LAB — CBC
HCT: 45.7 % (ref 39.0–52.0)
Hemoglobin: 15.7 g/dL (ref 13.0–17.0)
MCH: 32.2 pg (ref 26.0–34.0)
MCHC: 34.4 g/dL (ref 30.0–36.0)
MCV: 93.6 fL (ref 80.0–100.0)
Platelets: 251 10*3/uL (ref 150–400)
RBC: 4.88 MIL/uL (ref 4.22–5.81)
RDW: 12.9 % (ref 11.5–15.5)
WBC: 11.5 10*3/uL — ABNORMAL HIGH (ref 4.0–10.5)
nRBC: 0 % (ref 0.0–0.2)

## 2022-05-26 LAB — TYPE AND SCREEN
ABO/RH(D): AB NEG
Antibody Screen: NEGATIVE

## 2022-05-26 LAB — PROTIME-INR
INR: 1 (ref 0.8–1.2)
Prothrombin Time: 13.5 seconds (ref 11.4–15.2)

## 2022-05-26 LAB — SURGICAL PCR SCREEN
MRSA, PCR: NEGATIVE
Staphylococcus aureus: NEGATIVE

## 2022-05-26 NOTE — Progress Notes (Signed)
PCP: Dr. Lelon Huh Cardiologist: Dr. Pricilla Handler Pulmonary: Dr. Vernard Gambles  EKG: 05-08-22 CXR: 05-26-22 ECHO: 03-15-2022 Stress Test: 04-23-2022 Cardiac Cath: 2010  ERAS: NPO  Covid tested today  Last Dose ASA: 05-24-2022 per pt  Patient denies shortness of breath, fever, cough, and chest pain at PAT appointment.  Patient verbalized understanding of instructions provided today at the PAT appointment.  Patient asked to review instructions at home and day of surgery.

## 2022-05-26 NOTE — Progress Notes (Signed)
Notified Gaffer at Dr. Leonarda Salon office about elevated WBC and creatinine.

## 2022-05-27 ENCOUNTER — Other Ambulatory Visit: Payer: Self-pay | Admitting: *Deleted

## 2022-05-27 DIAGNOSIS — R911 Solitary pulmonary nodule: Secondary | ICD-10-CM

## 2022-05-27 LAB — SARS CORONAVIRUS 2 (TAT 6-24 HRS): SARS Coronavirus 2: NEGATIVE

## 2022-05-27 NOTE — Anesthesia Preprocedure Evaluation (Signed)
Anesthesia Evaluation  Patient identified by MRN, date of birth, ID band Patient awake    Reviewed: Allergy & Precautions, H&P , NPO status , Patient's Chart, lab work & pertinent test results  Airway Mallampati: II  TM Distance: >3 FB Neck ROM: Full   Comment: H/O laryngeal cancer treated with radiation and chemo Dental no notable dental hx.    Pulmonary sleep apnea , Current Smoker and Patient abstained from smoking.   Pulmonary exam normal breath sounds clear to auscultation       Cardiovascular hypertension, Normal cardiovascular exam Rhythm:Regular Rate:Normal  1. Left ventricular ejection fraction, by estimation, is 60 to 65%. The  left ventricle has normal function. The left ventricle has no regional  wall motion abnormalities. Left ventricular diastolic parameters are  indeterminate.   2. Right ventricular systolic function is normal. The right ventricular  size is normal. There is normal pulmonary artery systolic pressure. The  estimated right ventricular systolic pressure is 24.4 mmHg.   3. The mitral valve is normal in structure. No evidence of mitral valve  regurgitation. No evidence of mitral stenosis.   4. The aortic valve is tricuspid. Aortic valve regurgitation is not  visualized. Aortic valve sclerosis/calcification is present, without any  evidence of aortic stenosis.     Neuro/Psych negative neurological ROS  negative psych ROS   GI/Hepatic Neg liver ROS,GERD  ,,  Endo/Other  negative endocrine ROS    Renal/GU negative Renal ROS  negative genitourinary   Musculoskeletal negative musculoskeletal ROS (+)    Abdominal   Peds negative pediatric ROS (+)  Hematology negative hematology ROS (+)   Anesthesia Other Findings   Reproductive/Obstetrics negative OB ROS                             Anesthesia Physical Anesthesia Plan  ASA: 3  Anesthesia Plan: General    Post-op Pain Management:    Induction: Intravenous  PONV Risk Score and Plan: 1 and Ondansetron, Dexamethasone and Treatment may vary due to age or medical condition  Airway Management Planned: Double Lumen EBT  Additional Equipment: Arterial line  Intra-op Plan:   Post-operative Plan: Extubation in OR  Informed Consent: I have reviewed the patients History and Physical, chart, labs and discussed the procedure including the risks, benefits and alternatives for the proposed anesthesia with the patient or authorized representative who has indicated his/her understanding and acceptance.     Dental advisory given  Plan Discussed with: CRNA and Surgeon  Anesthesia Plan Comments: (PAT note by Karoline Caldwell, PA-C: Follows with cardiology for history of coronary calcifications on CT, HTN, OSA, paroxysmal atrial fibrillation not on anticoagulation.  Nuclear stress 04/23/2022 was low risk. Echo 03/15/2022 showed normal LV function, normal wall motion, no significant valvular abnormalities.  Seen for preop eval by Gerrie Nordmann, NP 05/08/2022.  Per note, "Mr.Cosens's perioperative risk of a major cardiac event is0.9% according to the Revised Cardiac Risk Index (RCRI). Therefore, [facility]is [facility] perioperative complications. Hisfunctional capacity is fairat 5.62METs according to the [facility] Activity Status Index (DASI). Recommendations: According to ACC/AHA guidelines, no further cardiovascular testing needed. The patient may proceed to surgery at acceptable risk. Antiplatelet and/or Anticoagulation Recommendations: The patient should remain on Aspirin without interruption."  OSA, intolerant to CPAP.  Patient recently underwent robotic assisted navigational bronchoscopy complicated by postprocedural pneumothorax.  Pneumothorax catheter was placed.  He was noted to be completely asymptomatic.  He had outpatient follow-up with pulmonologist  Dr. Patsey Berthold on 04/29/2022.At that time he was  reportedly feeling well.  Minimal residual right apical pneumothorax on imaging.  Chest tube noted to be nonfunctional and was discontinued.  History of stage I laryngeal cancer treated with radiation.  Preop labs reviewed, creatinine mildly elevated 1.36, otherwise unremarkable.  EKG 05/08/2022: Sinus bradycardia with PACs.  Rate 58.  Nuclear stress 04/23/2022: Pharmacological myocardial perfusion imaging study with no significant ischemia Normal wall motion, EF estimated at 65% No EKG changes concerning for ischemia at peak stress or in recovery. CT attenuation correction with mild three-vessel coronary calcification and aortic atherosclerosis Low risk scan  TTE 03/15/2022: 1. Left ventricular ejection fraction, by estimation, is 60 to 65%. The  left ventricle has normal function. The left ventricle has no regional  wall motion abnormalities. Left ventricular diastolic parameters are  indeterminate.  2. Right ventricular systolic function is normal. The right ventricular  size is normal. There is normal pulmonary artery systolic pressure. The  estimated right ventricular systolic pressure is 18.5 mmHg.  3. The mitral valve is normal in structure. No evidence of mitral valve  regurgitation. No evidence of mitral stenosis.  4. The aortic valve is tricuspid. Aortic valve regurgitation is not  visualized. Aortic valve sclerosis/calcification is present, without any  evidence of aortic stenosis.  5. The inferior vena cava is normal in size with greater than 50%  respiratory variability, suggesting right atrial pressure of 3 mmHg.    )        Anesthesia Quick Evaluation

## 2022-05-27 NOTE — Progress Notes (Signed)
Anesthesia Chart Review:  Follows with cardiology for history of coronary calcifications on CT, HTN, OSA, paroxysmal atrial fibrillation not on anticoagulation.  Nuclear stress 04/23/2022 was low risk. Echo 03/15/2022 showed normal LV function, normal wall motion, no significant valvular abnormalities.  Seen for preop eval by Gerrie Nordmann, NP 05/08/2022.  Per note, "Mr. Huelsmann's perioperative risk of a major cardiac event is 0.9% according to the Revised Cardiac Risk Index (RCRI).  Therefore, he is at low risk for perioperative complications.   His functional capacity is fair at 5.62 METs according to the [facility] Activity Status Index (DASI). Recommendations: According to ACC/AHA guidelines, no further cardiovascular testing needed.  The patient may proceed to surgery at acceptable risk. Antiplatelet and/or Anticoagulation Recommendations: The patient should remain on Aspirin without interruption."  OSA, intolerant to CPAP.  Patient recently underwent robotic assisted navigational bronchoscopy complicated by postprocedural pneumothorax.  Pneumothorax catheter was placed.  He was noted to be completely asymptomatic.  He had outpatient follow-up with pulmonologist Dr. Patsey Berthold on 04/29/2022.At that time he was reportedly feeling well.  Minimal residual right apical pneumothorax on imaging.  Chest tube noted to be nonfunctional and was discontinued.  History of stage I laryngeal cancer treated with radiation.  Preop labs reviewed, creatinine mildly elevated 1.36, otherwise unremarkable.  EKG 05/08/2022: Sinus bradycardia with PACs.  Rate 58.  Nuclear stress 04/23/2022: Pharmacological myocardial perfusion imaging study with no significant  ischemia Normal wall motion, EF estimated at 65% No EKG changes concerning for ischemia at peak stress or in recovery. CT attenuation correction with mild three-vessel coronary calcification and aortic atherosclerosis Low risk scan   TTE 03/15/2022:  1. Left  ventricular ejection fraction, by estimation, is 60 to 65%. The  left ventricle has normal function. The left ventricle has no regional  wall motion abnormalities. Left ventricular diastolic parameters are  indeterminate.   2. Right ventricular systolic function is normal. The right ventricular  size is normal. There is normal pulmonary artery systolic pressure. The  estimated right ventricular systolic pressure is 82.9 mmHg.   3. The mitral valve is normal in structure. No evidence of mitral valve  regurgitation. No evidence of mitral stenosis.   4. The aortic valve is tricuspid. Aortic valve regurgitation is not  visualized. Aortic valve sclerosis/calcification is present, without any  evidence of aortic stenosis.   5. The inferior vena cava is normal in size with greater than 50%  respiratory variability, suggesting right atrial pressure of 3 mmHg.     Wynonia [facility] Short Stay Center/Anesthesiology Phone [phone] 05/27/2022 10:08 AM

## 2022-05-28 ENCOUNTER — Other Ambulatory Visit: Payer: Self-pay

## 2022-05-28 ENCOUNTER — Inpatient Hospital Stay (HOSPITAL_COMMUNITY): Payer: Medicare HMO

## 2022-05-28 ENCOUNTER — Encounter (HOSPITAL_COMMUNITY): Payer: Self-pay | Admitting: Thoracic Surgery (Cardiothoracic Vascular Surgery)

## 2022-05-28 ENCOUNTER — Encounter (HOSPITAL_COMMUNITY)
Admission: RE | Disposition: A | Discharge status: Home / Self Care | Payer: Self-pay | Source: Ambulatory Visit | Attending: Thoracic Surgery (Cardiothoracic Vascular Surgery)

## 2022-05-28 ENCOUNTER — Inpatient Hospital Stay (HOSPITAL_COMMUNITY): Payer: Medicare HMO | Admitting: Physician Assistant

## 2022-05-28 ENCOUNTER — Inpatient Hospital Stay (HOSPITAL_COMMUNITY)
Admission: RE | Admit: 2022-05-28 | Discharge: 2022-05-31 | DRG: 164 | Disposition: A | Discharge status: Home / Self Care | Payer: Medicare HMO | Source: Ambulatory Visit | Attending: Thoracic Surgery (Cardiothoracic Vascular Surgery) | Admitting: Thoracic Surgery (Cardiothoracic Vascular Surgery)

## 2022-05-28 DIAGNOSIS — C782 Secondary malignant neoplasm of pleura: Secondary | ICD-10-CM | POA: Diagnosis not present

## 2022-05-28 DIAGNOSIS — Z8249 Family history of ischemic heart disease and other diseases of the circulatory system: Secondary | ICD-10-CM | POA: Diagnosis not present

## 2022-05-28 DIAGNOSIS — G4730 Sleep apnea, unspecified: Secondary | ICD-10-CM | POA: Diagnosis not present

## 2022-05-28 DIAGNOSIS — Z8616 Personal history of COVID-19: Secondary | ICD-10-CM

## 2022-05-28 DIAGNOSIS — Z923 Personal history of irradiation: Secondary | ICD-10-CM

## 2022-05-28 DIAGNOSIS — J439 Emphysema, unspecified: Secondary | ICD-10-CM | POA: Diagnosis not present

## 2022-05-28 DIAGNOSIS — Z1152 Encounter for screening for COVID-19: Secondary | ICD-10-CM | POA: Diagnosis not present

## 2022-05-28 DIAGNOSIS — K219 Gastro-esophageal reflux disease without esophagitis: Secondary | ICD-10-CM | POA: Diagnosis present

## 2022-05-28 DIAGNOSIS — Z801 Family history of malignant neoplasm of trachea, bronchus and lung: Secondary | ICD-10-CM | POA: Diagnosis not present

## 2022-05-28 DIAGNOSIS — G4733 Obstructive sleep apnea (adult) (pediatric): Secondary | ICD-10-CM | POA: Diagnosis present

## 2022-05-28 DIAGNOSIS — C3411 Malignant neoplasm of upper lobe, right bronchus or lung: Principal | ICD-10-CM | POA: Diagnosis present

## 2022-05-28 DIAGNOSIS — J9382 Other air leak: Secondary | ICD-10-CM | POA: Diagnosis not present

## 2022-05-28 DIAGNOSIS — Z7982 Long term (current) use of aspirin: Secondary | ICD-10-CM | POA: Diagnosis not present

## 2022-05-28 DIAGNOSIS — R69 Illness, unspecified: Secondary | ICD-10-CM | POA: Diagnosis not present

## 2022-05-28 DIAGNOSIS — I480 Paroxysmal atrial fibrillation: Secondary | ICD-10-CM | POA: Diagnosis not present

## 2022-05-28 DIAGNOSIS — I10 Essential (primary) hypertension: Secondary | ICD-10-CM | POA: Diagnosis present

## 2022-05-28 DIAGNOSIS — Z79899 Other long term (current) drug therapy: Secondary | ICD-10-CM | POA: Diagnosis not present

## 2022-05-28 DIAGNOSIS — H3530 Unspecified macular degeneration: Secondary | ICD-10-CM | POA: Diagnosis not present

## 2022-05-28 DIAGNOSIS — R911 Solitary pulmonary nodule: Secondary | ICD-10-CM | POA: Diagnosis present

## 2022-05-28 DIAGNOSIS — F17210 Nicotine dependence, cigarettes, uncomplicated: Secondary | ICD-10-CM | POA: Diagnosis not present

## 2022-05-28 DIAGNOSIS — Z8521 Personal history of malignant neoplasm of larynx: Secondary | ICD-10-CM

## 2022-05-28 DIAGNOSIS — C3490 Malignant neoplasm of unspecified part of unspecified bronchus or lung: Secondary | ICD-10-CM | POA: Diagnosis not present

## 2022-05-28 DIAGNOSIS — Z880 Allergy status to penicillin: Secondary | ICD-10-CM

## 2022-05-28 DIAGNOSIS — Z888 Allergy status to other drugs, medicaments and biological substances status: Secondary | ICD-10-CM

## 2022-05-28 DIAGNOSIS — J939 Pneumothorax, unspecified: Secondary | ICD-10-CM | POA: Diagnosis not present

## 2022-05-28 DIAGNOSIS — J9811 Atelectasis: Secondary | ICD-10-CM | POA: Diagnosis not present

## 2022-05-28 HISTORY — PX: LYMPH NODE DISSECTION: SHX5087

## 2022-05-28 HISTORY — PX: INTERCOSTAL NERVE BLOCK: SHX5021

## 2022-05-28 LAB — BASIC METABOLIC PANEL
Anion gap: 13 (ref 5–15)
BUN: 29 mg/dL — ABNORMAL HIGH (ref 8–23)
CO2: 25 mmol/L (ref 22–32)
Calcium: 9.3 mg/dL (ref 8.9–10.3)
Chloride: 101 mmol/L (ref 98–111)
Creatinine, Ser: 1.43 mg/dL — ABNORMAL HIGH (ref 0.61–1.24)
GFR, Estimated: 51 mL/min — ABNORMAL LOW (ref >60)
Glucose, Bld: 113 mg/dL — ABNORMAL HIGH (ref 70–99)
Potassium: 3.4 mmol/L — ABNORMAL LOW (ref 3.5–5.1)
Sodium: 139 mmol/L (ref 135–145)

## 2022-05-28 LAB — CBC
HCT: 45.7 % (ref 39.0–52.0)
Hemoglobin: 14.7 g/dL (ref 13.0–17.0)
MCH: 31.8 pg (ref 26.0–34.0)
MCHC: 32.2 g/dL (ref 30.0–36.0)
MCV: 98.9 fL (ref 80.0–100.0)
Platelets: 206 10*3/uL (ref 150–400)
RBC: 4.62 MIL/uL (ref 4.22–5.81)
RDW: 13.1 % (ref 11.5–15.5)
WBC: 7.9 10*3/uL (ref 4.0–10.5)
nRBC: 0 % (ref 0.0–0.2)

## 2022-05-28 LAB — ABO/RH: ABO/RH(D): AB NEG

## 2022-05-28 SURGERY — LOBECTOMY, LUNG, ROBOT-ASSISTED, USING VATS
Anesthesia: General | Site: Chest | Laterality: Right

## 2022-05-28 MED ORDER — BUPIVACAINE LIPOSOME 1.3 % IJ SUSP
INTRAMUSCULAR | Status: AC
  Filled 2022-05-28: qty 20

## 2022-05-28 MED ORDER — PROPOFOL 10 MG/ML IV BOLUS
INTRAVENOUS | Status: DC | PRN
  Administered 2022-05-28: 200 mg via INTRAVENOUS

## 2022-05-28 MED ORDER — DILTIAZEM HCL ER COATED BEADS 120 MG PO CP24: 120.0000 mg | ORAL_CAPSULE | Freq: Every day | ORAL | Status: DC

## 2022-05-28 MED ORDER — LACTATED RINGERS IV SOLN: INTRAVENOUS | Status: DC

## 2022-05-28 MED ORDER — ROCURONIUM BROMIDE 10 MG/ML (PF) SYRINGE
PREFILLED_SYRINGE | INTRAVENOUS | Status: DC | PRN
  Administered 2022-05-28 (×2): 50 mg via INTRAVENOUS
  Administered 2022-05-28: 30 mg via INTRAVENOUS
  Administered 2022-05-28: 50 mg via INTRAVENOUS

## 2022-05-28 MED ORDER — ORAL CARE MOUTH RINSE: 15.0000 mL | Freq: Once | OROMUCOSAL | Status: AC

## 2022-05-28 MED ORDER — PHENYLEPHRINE HCL-NACL 20-0.9 MG/250ML-% IV SOLN
INTRAVENOUS | Status: DC | PRN
  Administered 2022-05-28: 25 ug/min via INTRAVENOUS

## 2022-05-28 MED ORDER — BUPIVACAINE HCL (PF) 0.5 % IJ SOLN
INTRAMUSCULAR | Status: AC
  Filled 2022-05-28: qty 30

## 2022-05-28 MED ORDER — OXYCODONE HCL 5 MG PO TABS
5.0000 mg | ORAL_TABLET | ORAL | Status: DC | PRN
  Administered 2022-05-29: 5 mg via ORAL
  Filled 2022-05-28: qty 1

## 2022-05-28 MED ORDER — GABAPENTIN 300 MG PO CAPS
300.0000 mg | ORAL_CAPSULE | Freq: Every day | ORAL | Status: AC
  Administered 2022-05-28 – 2022-05-30 (×3): 300 mg via ORAL
  Filled 2022-05-28 (×3): qty 1

## 2022-05-28 MED ORDER — PROPOFOL 1000 MG/100ML IV EMUL
INTRAVENOUS | Status: AC
  Filled 2022-05-28: qty 100

## 2022-05-28 MED ORDER — ACETAMINOPHEN 160 MG/5ML PO SOLN: 1000.0000 mg | Freq: Four times a day (QID) | ORAL | Status: DC

## 2022-05-28 MED ORDER — FLUTICASONE PROPIONATE 50 MCG/ACT NA SUSP
2.0000 | NASAL | Status: DC
  Administered 2022-05-29 – 2022-05-31 (×3): 2 via NASAL
  Filled 2022-05-28: qty 16

## 2022-05-28 MED ORDER — METOPROLOL TARTRATE 5 MG/5ML IV SOLN
INTRAVENOUS | Status: DC | PRN
  Administered 2022-05-28 (×2): 1 mg via INTRAVENOUS

## 2022-05-28 MED ORDER — HEMOSTATIC AGENTS (NO CHARGE) OPTIME
TOPICAL | Status: DC | PRN
  Administered 2022-05-28: 1 via TOPICAL

## 2022-05-28 MED ORDER — CHLORHEXIDINE GLUCONATE 0.12 % MT SOLN
OROMUCOSAL | Status: AC
  Administered 2022-05-28: 15 mL via OROMUCOSAL
  Filled 2022-05-28: qty 15

## 2022-05-28 MED ORDER — 0.9 % SODIUM CHLORIDE (POUR BTL) OPTIME
TOPICAL | Status: DC | PRN
  Administered 2022-05-28: 2000 mL

## 2022-05-28 MED ORDER — SUCCINYLCHOLINE CHLORIDE 200 MG/10ML IV SOSY
PREFILLED_SYRINGE | INTRAVENOUS | Status: AC
  Filled 2022-05-28: qty 10

## 2022-05-28 MED ORDER — POTASSIUM CHLORIDE CRYS ER 20 MEQ PO TBCR
20.0000 meq | EXTENDED_RELEASE_TABLET | Freq: Every day | ORAL | Status: DC
  Administered 2022-05-29 – 2022-05-30 (×2): 20 meq via ORAL
  Filled 2022-05-28 (×2): qty 1

## 2022-05-28 MED ORDER — PROPOFOL 10 MG/ML IV BOLUS
INTRAVENOUS | Status: AC
  Filled 2022-05-28: qty 20

## 2022-05-28 MED ORDER — FENTANYL CITRATE (PF) 250 MCG/5ML IJ SOLN
INTRAMUSCULAR | Status: DC | PRN
  Administered 2022-05-28: 100 ug via INTRAVENOUS
  Administered 2022-05-28: 50 ug via INTRAVENOUS

## 2022-05-28 MED ORDER — SUGAMMADEX SODIUM 200 MG/2ML IV SOLN
INTRAVENOUS | Status: DC | PRN
  Administered 2022-05-28: 200 mg via INTRAVENOUS

## 2022-05-28 MED ORDER — LACTATED RINGERS IV SOLN: INTRAVENOUS | Status: DC | PRN

## 2022-05-28 MED ORDER — ROCURONIUM BROMIDE 10 MG/ML (PF) SYRINGE
PREFILLED_SYRINGE | INTRAVENOUS | Status: AC
  Filled 2022-05-28: qty 20

## 2022-05-28 MED ORDER — BISACODYL 5 MG PO TBEC
10.0000 mg | DELAYED_RELEASE_TABLET | Freq: Every day | ORAL | Status: DC
  Administered 2022-05-29 – 2022-05-30 (×2): 10 mg via ORAL
  Filled 2022-05-28 (×3): qty 2

## 2022-05-28 MED ORDER — GLYCOPYRROLATE 0.2 MG/ML IJ SOLN
INTRAMUSCULAR | Status: DC | PRN
  Administered 2022-05-28: .2 mg via INTRAVENOUS

## 2022-05-28 MED ORDER — SODIUM CHLORIDE 0.9% IV SOLUTION
INTRAVENOUS | Status: AC | PRN
  Administered 2022-05-28: 1000 mL via INTRAMUSCULAR

## 2022-05-28 MED ORDER — FENTANYL CITRATE (PF) 250 MCG/5ML IJ SOLN
INTRAMUSCULAR | Status: AC
  Filled 2022-05-28: qty 5

## 2022-05-28 MED ORDER — SERTRALINE HCL 100 MG PO TABS
100.0000 mg | ORAL_TABLET | Freq: Every day | ORAL | Status: DC
  Administered 2022-05-29 – 2022-05-31 (×3): 100 mg via ORAL
  Filled 2022-05-28 (×3): qty 1

## 2022-05-28 MED ORDER — ACETAMINOPHEN 500 MG PO TABS
1000.0000 mg | ORAL_TABLET | Freq: Four times a day (QID) | ORAL | Status: DC
  Administered 2022-05-28 – 2022-05-31 (×11): 1000 mg via ORAL
  Filled 2022-05-28 (×11): qty 2

## 2022-05-28 MED ORDER — HYDROCHLOROTHIAZIDE 25 MG PO TABS: 25.0000 mg | ORAL_TABLET | Freq: Every day | ORAL | Status: DC

## 2022-05-28 MED ORDER — DEXAMETHASONE SODIUM PHOSPHATE 10 MG/ML IJ SOLN
INTRAMUSCULAR | Status: AC
  Filled 2022-05-28: qty 1

## 2022-05-28 MED ORDER — VANCOMYCIN HCL IN DEXTROSE 1-5 GM/200ML-% IV SOLN
1000.0000 mg | Freq: Two times a day (BID) | INTRAVENOUS | Status: AC
  Administered 2022-05-28: 1000 mg via INTRAVENOUS
  Filled 2022-05-28: qty 200

## 2022-05-28 MED ORDER — ONDANSETRON HCL 4 MG/2ML IJ SOLN: 4.0000 mg | Freq: Once | INTRAMUSCULAR | Status: DC | PRN

## 2022-05-28 MED ORDER — GLYCOPYRROLATE PF 0.2 MG/ML IJ SOSY
PREFILLED_SYRINGE | INTRAMUSCULAR | Status: AC
  Filled 2022-05-28: qty 1

## 2022-05-28 MED ORDER — SUCCINYLCHOLINE CHLORIDE 200 MG/10ML IV SOSY
PREFILLED_SYRINGE | INTRAVENOUS | Status: DC | PRN
  Administered 2022-05-28: 100 mg via INTRAVENOUS

## 2022-05-28 MED ORDER — CHLORHEXIDINE GLUCONATE 0.12 % MT SOLN: 15.0000 mL | Freq: Once | OROMUCOSAL | Status: AC

## 2022-05-28 MED ORDER — LIDOCAINE 2% (20 MG/ML) 5 ML SYRINGE
INTRAMUSCULAR | Status: DC | PRN
  Administered 2022-05-28: 100 mg via INTRAVENOUS

## 2022-05-28 MED ORDER — ASPIRIN 81 MG PO TBEC
81.0000 mg | DELAYED_RELEASE_TABLET | Freq: Every day | ORAL | Status: DC
  Administered 2022-05-29 – 2022-05-31 (×3): 81 mg via ORAL
  Filled 2022-05-28 (×3): qty 1

## 2022-05-28 MED ORDER — LIDOCAINE 2% (20 MG/ML) 5 ML SYRINGE
INTRAMUSCULAR | Status: AC
  Filled 2022-05-28: qty 5

## 2022-05-28 MED ORDER — ALBUMIN HUMAN 5 % IV SOLN: INTRAVENOUS | Status: DC | PRN

## 2022-05-28 MED ORDER — ONDANSETRON HCL 4 MG/2ML IJ SOLN
INTRAMUSCULAR | Status: DC | PRN
  Administered 2022-05-28: 4 mg via INTRAVENOUS

## 2022-05-28 MED ORDER — HYDROMORPHONE HCL 1 MG/ML IJ SOLN: 0.2500 mg | INTRAMUSCULAR | Status: DC | PRN

## 2022-05-28 MED ORDER — DEXAMETHASONE SODIUM PHOSPHATE 10 MG/ML IJ SOLN
INTRAMUSCULAR | Status: DC | PRN
  Administered 2022-05-28: 10 mg via INTRAVENOUS

## 2022-05-28 MED ORDER — VANCOMYCIN HCL 1000 MG IV SOLR
INTRAVENOUS | Status: DC | PRN
  Administered 2022-05-28: 1000 mg via INTRAVENOUS

## 2022-05-28 MED ORDER — SENNOSIDES-DOCUSATE SODIUM 8.6-50 MG PO TABS
1.0000 | ORAL_TABLET | Freq: Every day | ORAL | Status: DC
  Administered 2022-05-28 – 2022-05-29 (×2): 1 via ORAL
  Filled 2022-05-28 (×3): qty 1

## 2022-05-28 MED ORDER — GABAPENTIN 300 MG PO CAPS: 300.0000 mg | ORAL_CAPSULE | Freq: Two times a day (BID) | ORAL | Status: DC

## 2022-05-28 MED ORDER — ONDANSETRON HCL 4 MG/2ML IJ SOLN
INTRAMUSCULAR | Status: AC
  Filled 2022-05-28: qty 2

## 2022-05-28 MED ORDER — BUPROPION HCL ER (SR) 150 MG PO TB12
150.0000 mg | ORAL_TABLET | Freq: Two times a day (BID) | ORAL | Status: DC
  Administered 2022-05-29 – 2022-05-31 (×5): 150 mg via ORAL
  Filled 2022-05-28 (×6): qty 1

## 2022-05-28 MED ORDER — SODIUM CHLORIDE FLUSH 0.9 % IV SOLN
INTRAVENOUS | Status: DC | PRN
  Administered 2022-05-28: 91 mL

## 2022-05-28 MED ORDER — PANTOPRAZOLE SODIUM 40 MG PO TBEC
40.0000 mg | DELAYED_RELEASE_TABLET | Freq: Every day | ORAL | Status: DC
  Administered 2022-05-29 – 2022-05-31 (×3): 40 mg via ORAL
  Filled 2022-05-28 (×3): qty 1

## 2022-05-28 MED ORDER — SODIUM CHLORIDE 0.45 % IV SOLN: INTRAVENOUS | Status: AC

## 2022-05-28 MED ORDER — MORPHINE SULFATE (PF) 2 MG/ML IV SOLN: 2.0000 mg | INTRAVENOUS | Status: DC | PRN

## 2022-05-28 MED ORDER — CYCLOSPORINE 0.05 % OP EMUL: 1.0000 [drp] | Freq: Every day | OPHTHALMIC | Status: DC | PRN

## 2022-05-28 MED ORDER — ONDANSETRON HCL 4 MG/2ML IJ SOLN: 4.0000 mg | Freq: Four times a day (QID) | INTRAMUSCULAR | Status: DC | PRN

## 2022-05-28 MED ORDER — VANCOMYCIN HCL IN DEXTROSE 1-5 GM/200ML-% IV SOLN
1000.0000 mg | INTRAVENOUS | Status: AC
  Administered 2022-05-28: 1000 mg via INTRAVENOUS
  Filled 2022-05-28: qty 200

## 2022-05-28 MED ORDER — PHENYLEPHRINE 80 MCG/ML (10ML) SYRINGE FOR IV PUSH (FOR BLOOD PRESSURE SUPPORT)
PREFILLED_SYRINGE | INTRAVENOUS | Status: DC | PRN
  Administered 2022-05-28: 80 ug via INTRAVENOUS

## 2022-05-28 SURGICAL SUPPLY — 99 items
ADH SKN CLS APL DERMABOND .7 (GAUZE/BANDAGES/DRESSINGS) ×2
BAG TISS RTRVL C300 12X14 (MISCELLANEOUS) ×2
BLADE CLIPPER SURG (BLADE) IMPLANT
CANISTER SUCT 3000ML PPV (MISCELLANEOUS) ×4 IMPLANT
CANNULA REDUC XI 12-8 STAPL (CANNULA) ×4
CANNULA REDUCER 12-8 DVNC XI (CANNULA) ×4 IMPLANT
CNTNR URN SCR LID CUP LEK RST (MISCELLANEOUS) ×10 IMPLANT
CONN ST 1/4X3/8  BEN (MISCELLANEOUS)
CONN ST 1/4X3/8 BEN (MISCELLANEOUS) IMPLANT
CONT SPEC 4OZ STRL OR WHT (MISCELLANEOUS) ×26
DEFOGGER SCOPE WARMER CLEARIFY (MISCELLANEOUS) ×2 IMPLANT
DERMABOND ADVANCED .7 DNX12 (GAUZE/BANDAGES/DRESSINGS) ×2 IMPLANT
DRAIN CHANNEL 28F RND 3/8 FF (WOUND CARE) IMPLANT
DRAIN CHANNEL 32F RND 10.7 FF (WOUND CARE) IMPLANT
DRAPE ARM DVNC X/XI (DISPOSABLE) ×8 IMPLANT
DRAPE COLUMN DVNC XI (DISPOSABLE) ×2 IMPLANT
DRAPE CV SPLIT W-CLR ANES SCRN (DRAPES) ×2 IMPLANT
DRAPE DA VINCI XI ARM (DISPOSABLE) ×8
DRAPE DA VINCI XI COLUMN (DISPOSABLE) ×2
DRAPE HALF SHEET 40X57 (DRAPES) ×2 IMPLANT
DRAPE INCISE IOBAN 66X45 STRL (DRAPES) IMPLANT
DRAPE ORTHO SPLIT 77X108 STRL (DRAPES) ×2
DRAPE SURG ORHT 6 SPLT 77X108 (DRAPES) ×2 IMPLANT
ELECT BLADE 6.5 EXT (BLADE) ×2 IMPLANT
ELECT REM PT RETURN 9FT ADLT (ELECTROSURGICAL) ×2
ELECTRODE REM PT RTRN 9FT ADLT (ELECTROSURGICAL) ×2 IMPLANT
GAUZE KITTNER 4X5 RF (MISCELLANEOUS) ×4 IMPLANT
GAUZE SPONGE 4X4 12PLY STRL (GAUZE/BANDAGES/DRESSINGS) ×2 IMPLANT
GAUZE SPONGE 4X4 12PLY STRL LF (GAUZE/BANDAGES/DRESSINGS) IMPLANT
GLOVE SS BIOGEL STRL SZ 7.5 (GLOVE) ×4 IMPLANT
GLOVE SURG POLYISO LF SZ8 (GLOVE) ×2 IMPLANT
GOWN STRL REUS W/ TWL LRG LVL3 (GOWN DISPOSABLE) ×4 IMPLANT
GOWN STRL REUS W/ TWL XL LVL3 (GOWN DISPOSABLE) ×4 IMPLANT
GOWN STRL REUS W/TWL 2XL LVL3 (GOWN DISPOSABLE) ×2 IMPLANT
GOWN STRL REUS W/TWL LRG LVL3 (GOWN DISPOSABLE) ×4
GOWN STRL REUS W/TWL XL LVL3 (GOWN DISPOSABLE) ×4
HEMOSTAT SURGICEL 2X14 (HEMOSTASIS) ×6 IMPLANT
IRRIGATION STRYKERFLOW (MISCELLANEOUS) ×2 IMPLANT
IRRIGATOR STRYKERFLOW (MISCELLANEOUS) ×2
KIT BASIN OR (CUSTOM PROCEDURE TRAY) ×2 IMPLANT
KIT SUCTION CATH 14FR (SUCTIONS) IMPLANT
KIT TURNOVER KIT B (KITS) ×2 IMPLANT
NDL HYPO 25GX1X1/2 BEV (NEEDLE) ×2 IMPLANT
NDL SPNL 22GX3.5 QUINCKE BK (NEEDLE) ×2 IMPLANT
NEEDLE HYPO 25GX1X1/2 BEV (NEEDLE) ×2 IMPLANT
NEEDLE SPNL 22GX3.5 QUINCKE BK (NEEDLE) ×2 IMPLANT
NS IRRIG 1000ML POUR BTL (IV SOLUTION) ×4 IMPLANT
OBTURATOR OPTICAL LONG 8 DVNC (TROCAR) IMPLANT
OBTURATOR OPTICAL LONG 8MM (TROCAR)
PACK CHEST (CUSTOM PROCEDURE TRAY) ×2 IMPLANT
PAD ARMBOARD 7.5X6 YLW CONV (MISCELLANEOUS) ×4 IMPLANT
RELOAD EGIA 45 MED/THCK PURPLE (STAPLE) IMPLANT
RELOAD STAPLE 45 2.5 WHT DVNC (STAPLE) IMPLANT
RELOAD STAPLE 45 3.5 BLU DVNC (STAPLE) IMPLANT
RELOAD STAPLE 45 4.3 GRN DVNC (STAPLE) IMPLANT
RELOAD STAPLER 2.5X45 WHT DVNC (STAPLE) ×8 IMPLANT
RELOAD STAPLER 3.5X45 BLU DVNC (STAPLE) ×12 IMPLANT
RELOAD STAPLER 4.3X45 GRN DVNC (STAPLE) ×4 IMPLANT
SCISSORS LAP 5X35 DISP (ENDOMECHANICALS) IMPLANT
SEAL CANN UNIV 5-8 DVNC XI (MISCELLANEOUS) ×4 IMPLANT
SEAL XI 5MM-8MM UNIVERSAL (MISCELLANEOUS) ×4
SET TRI-LUMEN FLTR TB AIRSEAL (TUBING) ×2 IMPLANT
SOLUTION ELECTROLUBE (MISCELLANEOUS) ×2 IMPLANT
SPONGE INTESTINAL PEANUT (DISPOSABLE) IMPLANT
SPONGE TONSIL TAPE 1 RFD (DISPOSABLE) IMPLANT
STAPLER 45 SUREFORM CVD (STAPLE) ×2
STAPLER 45 SUREFORM CVD DVNC (STAPLE) IMPLANT
STAPLER CANNULA SEAL DVNC XI (STAPLE) ×4 IMPLANT
STAPLER CANNULA SEAL XI (STAPLE) ×4
STAPLER ENDO GIA 12 SHRT THIN (STAPLE) IMPLANT
STAPLER ENDO GIA 12MM SHORT (STAPLE) ×2 IMPLANT
STAPLER RELOAD 2.5X45 WHITE (STAPLE) ×8
STAPLER RELOAD 2.5X45 WHT DVNC (STAPLE) ×8
STAPLER RELOAD 3.5X45 BLU DVNC (STAPLE) ×12
STAPLER RELOAD 3.5X45 BLUE (STAPLE) ×12
STAPLER RELOAD 4.3X45 GREEN (STAPLE) ×4
STAPLER RELOAD 4.3X45 GRN DVNC (STAPLE) ×4
SUT PDS AB 2-0 CT1 27 (SUTURE) IMPLANT
SUT PROLENE 4 0 RB 1 (SUTURE)
SUT PROLENE 4-0 RB1 .5 CRCL 36 (SUTURE) IMPLANT
SUT SILK  1 MH (SUTURE) ×2
SUT SILK 1 MH (SUTURE) ×2 IMPLANT
SUT SILK 2 0 SH (SUTURE) ×2 IMPLANT
SUT SILK 2 0SH CR/8 30 (SUTURE) IMPLANT
SUT SILK 3 0SH CR/8 30 (SUTURE) IMPLANT
SUT VIC AB 1 CTX 36 (SUTURE) ×2
SUT VIC AB 1 CTX36XBRD ANBCTR (SUTURE) ×2 IMPLANT
SUT VIC AB 2-0 CTX 36 (SUTURE) ×2 IMPLANT
SUT VIC AB 3-0 X1 27 (SUTURE) ×4 IMPLANT
SUT VICRYL 0 TIES 12 18 (SUTURE) ×2 IMPLANT
SUT VICRYL 0 UR6 27IN ABS (SUTURE) ×4 IMPLANT
SUT VICRYL 2 TP 1 (SUTURE) IMPLANT
SYR 20CC LL (SYRINGE) ×4 IMPLANT
SYSTEM RETRIEVAL ANCHOR 12 (MISCELLANEOUS) IMPLANT
SYSTEM SAHARA CHEST DRAIN ATS (WOUND CARE) ×2 IMPLANT
TAPE CLOTH 4X10 WHT NS (GAUZE/BANDAGES/DRESSINGS) ×2 IMPLANT
TOWEL GREEN STERILE (TOWEL DISPOSABLE) ×2 IMPLANT
TRAY FOLEY MTR SLVR 16FR STAT (SET/KITS/TRAYS/PACK) ×2 IMPLANT
WATER STERILE IRR 1000ML POUR (IV SOLUTION) ×4 IMPLANT

## 2022-05-28 NOTE — Anesthesia Procedure Notes (Addendum)
Procedure Name: Intubation Date/Time: 05/28/2022 8:08 AM  Performed by: Lavell Luster, CRNAPre-anesthesia Checklist: Patient identified, Emergency Drugs available, Suction available, Patient being monitored and Timeout performed Patient Re-evaluated:Patient Re-evaluated prior to induction Oxygen Delivery Method: Circle system utilized Preoxygenation: Pre-oxygenation with 100% oxygen Induction Type: IV induction Ventilation: Mask ventilation with difficulty, Two handed mask ventilation required and Oral airway inserted - appropriate to patient size Laryngoscope Size: Glidescope and 4 Grade View: Grade II Tube type: Oral Endobronchial tube: Left and 37 Fr Number of attempts: 2 Airway Equipment and Method: Stylet and Video-laryngoscopy Placement Confirmation: ETT inserted through vocal cords under direct vision, positive ETCO2 and breath sounds checked- equal and bilateral Tube secured with: Tape Dental Injury: Bloody posterior oropharynx  Difficulty Due To: Difficulty was anticipated, Difficult Airway- due to anterior larynx and Difficult Airway- due to dentition Comments: Pt with previous h/o radiation to larynx.  Friable tissue noted.  Attempted DL x 1 with MAC 4 blade, grade II view noted, small glottic opening.  DLT size 37  would not pass. Dr Kalman Shan attempted DL with glidescope with same view, DLT passed with some difficulty.  Blood in posterior oropharynx noted.  Henderson Cloud, CRNA   Isolated right lung and no breath sound on right present.  Good view of carina on vivasight.  Henderson Cloud, CRNA

## 2022-05-28 NOTE — Interval H&P Note (Signed)
History and Physical Interval Note: No interval change 05/28/2022 7:31 AM  Jeffrey Torres  has presented today for surgery, with the diagnosis of RUL LUNG CANCER.  The various methods of treatment have been discussed with the patient and family. After consideration of risks, benefits and other options for treatment, the patient has consented to  Procedure(s): XI ROBOTIC ASSISTED THORACOSCOPY-RIGHT UPPER LOBECTOMY (Right) as a surgical intervention.  The patient's history has been reviewed, patient examined, no change in status, stable for surgery.  I have reviewed the patient's chart and labs.  Questions were answered to the patient's satisfaction.     Melrose Nakayama

## 2022-05-28 NOTE — Discharge Instructions (Signed)
Robot-Assisted Thoracic Surgery, Care After The following information offers guidance on how to care for yourself after your procedure. Your health care provider may also give you more specific instructions. If you have problems or questions, contact your health care provider. What can I expect after the procedure? After the procedure, it is common to have: Some pain and aches in the area of your surgical incisions. Pain when breathing in (inhaling) and coughing. Tiredness (fatigue). Trouble sleeping. Constipation. Follow these instructions at home: Medicines Take over-the-counter and prescription medicines only as told by your health care provider. If you were prescribed an antibiotic medicine, take it as told by your health care provider. Do not stop taking the antibiotic even if you start to feel better. Talk with your health care provider about safe and effective ways to manage pain after your procedure. Pain management should fit your specific health needs. Take pain medicine before pain becomes severe. Relieving and controlling your pain will make breathing easier for you. Ask your health care provider if the medicine prescribed to you requires you to avoid driving or using machinery. Eating and drinking Follow instructions from your health care provider about eating or drinking restrictions. These will vary depending on what procedure you had. Your health care provider may recommend: A liquid diet or soft diet for the first few days. Meals that are smaller and more frequent. A diet of fruits, vegetables, whole grains, and low-fat proteins. Limiting foods that are high in fat and processed sugar, including fried or sweet foods. Incision care Follow instructions from your health care provider about how to take care of your incisions. Make sure you: Wash your hands with soap and water for at least 20 seconds before and after you change your bandage (dressing). If soap and water are not  available, use hand sanitizer. Change your dressing as told by your health care provider. Leave stitches (sutures), skin glue, or adhesive strips in place. These skin closures may need to stay in place for 2 weeks or longer. If adhesive strip edges start to loosen and curl up, you may trim the loose edges. Do not remove adhesive strips completely unless your health care provider tells you to do that. Check your incision area every day for signs of infection. Check for: Redness, swelling, or more pain. Fluid or blood. Warmth. Pus or a bad smell. Activity Return to your normal activities as told by your health care provider. Ask your health care provider what activities are safe for you. Ask your health care provider when it is safe for you to drive. Do not lift anything that is heavier than 10 lb (4.5 kg), or the limit that you are told, until your health care provider says that it is safe. Rest as told by your health care provider. Avoid sitting for a long time without moving. Get up to take short walks every 1-2 hours. This is important to improve blood flow and breathing. Ask for help if you feel weak or unsteady. Do exercises as told by your health care provider. Pneumonia prevention  Do deep breathing exercises and cough regularly as directed. This helps clear mucus and opens your lungs. Doing this helps prevent lung infection (pneumonia). If you were given an incentive spirometer, use it as told. An incentive spirometer is a tool that measures how well you are filling your lungs with each breath. Coughing may hurt less if you try to support your chest. This is called splinting. Try one of these when you  cough: Hold a pillow against your chest. Place the palms of both hands on top of your incision area. Do not use any products that contain nicotine or tobacco. These products include cigarettes, chewing tobacco, and vaping devices, such as e-cigarettes. If you need help quitting, ask your  health care provider. Avoid secondhand smoke. General instructions If you have a drainage tube: Follow instructions from your health care provider about how to take care of it. Do not travel by airplane after your tube is removed until your health care provider tells you it is safe. You may need to take these actions to prevent or treat constipation: Drink enough fluid to keep your urine pale yellow. Take over-the-counter or prescription medicines. Eat foods that are high in fiber, such as beans, whole grains, and fresh fruits and vegetables. Limit foods that are high in fat and processed sugars, such as fried or sweet foods. Keep all follow-up visits. This is important. Contact a health care provider if: You have redness, swelling, or more pain around an incision. You have fluid or blood coming from an incision. An incision feels warm to the touch. You have pus or a bad smell coming from an incision. You have a fever. You cannot eat or drink without vomiting. Your pain medicine is not controlling your pain. Get help right away if: You have chest pain. Your heart is beating quickly. You have trouble breathing. You have trouble speaking. You are confused. You feel weak or dizzy, or you faint. These symptoms may represent a serious problem that is an emergency. Do not wait to see if the symptoms will go away. Get medical help right away. Call your local emergency services (911 in the [place] Do not drive yourself to the hospital. Summary Talk with your health care provider about safe and effective ways to manage pain after your procedure. Pain management should fit your specific health needs. Return to your normal activities as told by your health care provider. Ask your health care provider what activities are safe for you. Do deep breathing exercises and cough regularly as directed. This helps to clear mucus and prevent pneumonia. If it hurts to cough, ease pain by holding a pillow  against your chest or by placing the palms of both hands over your incisions. This information is not intended to replace advice given to you by your health care provider. Make sure you discuss any questions you have with your health care provider. Document Revised: 03/08/2020 Document Reviewed: 03/08/2020 Elsevier Patient Education  [place].

## 2022-05-28 NOTE — Anesthesia Procedure Notes (Signed)
Arterial Line Insertion Start/End11/30/2023 6:45 AM, 05/28/2022 6:59 AM Performed by: Josephine Igo, CRNA, CRNA  Patient location: Pre-op. Preanesthetic checklist: patient identified, IV checked, site marked, surgical consent, monitors and equipment checked and timeout performed Right, radial was placed Catheter size: 20 G Hand hygiene performed  and maximum sterile barriers used   Attempts: 1 Procedure performed without using ultrasound guided technique. Following insertion, dressing applied and Biopatch. Post procedure assessment: normal  Patient tolerated the procedure well with no immediate complications.

## 2022-05-28 NOTE — Anesthesia Postprocedure Evaluation (Signed)
Anesthesia Post Note  Patient: Jeffrey Torres  Procedure(s) Performed: XI ROBOTIC ASSISTED THORACOSCOPY-RIGHT UPPER LOBECTOMY (Right: Chest) LYMPH NODE DISSECTION INTERCOSTAL NERVE BLOCK     Patient location during evaluation: PACU Anesthesia Type: General Level of consciousness: awake and alert Pain management: pain level controlled Vital Signs Assessment: post-procedure vital signs reviewed and stable Respiratory status: spontaneous breathing, nonlabored ventilation, respiratory function stable and patient connected to nasal cannula oxygen Cardiovascular status: blood pressure returned to baseline and stable Postop Assessment: no apparent nausea or vomiting Anesthetic complications: yes  Encounter Notable Events  Notable Event Outcome Phase Comment  Difficult to intubate - expected  Intraprocedure Filed from anesthesia note documentation.    Last Vitals:  Vitals:   05/28/22 1301 05/28/22 1315  BP: (!) 123/57 126/61  Pulse: (!) 52 (!) 49  Resp: 18 14  Temp:    SpO2: 95% 94%    Last Pain:  Vitals:   05/28/22 1301  TempSrc:   PainSc: 0-No pain                 Christiana Gurevich S

## 2022-05-28 NOTE — Progress Notes (Signed)
Dr. Roxan Hockey made aware of today's repeat BMP result- Creatinine and BUN. No new orders received at this time.

## 2022-05-28 NOTE — Brief Op Note (Addendum)
05/28/2022  11:59 AM  PATIENT:  Jeffrey Torres  76 y.o. male  PRE-OPERATIVE DIAGNOSIS:  NON-SMALL CELL CARCINOMA RIGHT UPPER LOBE  POST-OPERATIVE DIAGNOSIS:  NON-SMALL CELL CARCINOMA RIGHT UPPER LOBE  PROCEDURE:  Procedure(s): XI ROBOTIC ASSISTED THORACOSCOPY-RIGHT UPPER LOBECTOMY (Right) LYMPH NODE DISSECTION INTERCOSTAL NERVE BLOCK  SURGEON:  Surgeon(s) and Role: Melrose Nakayama, MD - Primary  PHYSICIAN ASSISTANT: Wynelle Beckmann PA-C  ASSISTANTS: none   ANESTHESIA:   local and general  EBL:  400 mL   BLOOD ADMINISTERED:none  DRAINS:  Right pleural chest tube    LOCAL MEDICATIONS USED:  OTHER Exparel  SPECIMEN:  Source of Specimen:  Right upper lobe  and lymph nodes  DISPOSITION OF SPECIMEN:  PATHOLOGY  COUNTS:  YES  DICTATION: .Dragon Dictation  PLAN OF CARE: Admit to inpatient   PATIENT DISPOSITION:  PACU - hemodynamically stable.   Delay start of Pharmacological VTE agent (>24hrs) due to surgical blood loss or risk of bleeding: yes

## 2022-05-28 NOTE — Transfer of Care (Signed)
Immediate Anesthesia Transfer of Care Note  Patient: Jeffrey Torres  Procedure(s) Performed: XI ROBOTIC ASSISTED THORACOSCOPY-RIGHT UPPER LOBECTOMY (Right: Chest) LYMPH NODE DISSECTION INTERCOSTAL NERVE BLOCK  Patient Location: PACU  Anesthesia Type:General  Level of Consciousness: awake, alert , oriented, and patient cooperative  Airway & Oxygen Therapy: Patient connected to nasal cannula oxygen  Post-op Assessment: Post -op Vital signs reviewed and stable  Post vital signs: stable  Last Vitals:  Vitals Value Taken Time  BP 132/95 05/28/22 1202  Temp    Pulse 54 05/28/22 1203  Resp 15 05/28/22 1203  SpO2 92 % 05/28/22 1203  Vitals shown include unvalidated device data.  Last Pain:  Vitals:   05/28/22 4287  TempSrc:   PainSc: 0-No pain         Complications:  Encounter Notable Events  Notable Event Outcome Phase Comment  Difficult to intubate - expected  Intraprocedure Filed from anesthesia note documentation.

## 2022-05-28 NOTE — Progress Notes (Signed)
      [address]       [place] [address]             [phone]      Creatinine 1.43 from 1.36- still elevated from 9/17 but not rising rapidly Will monitor postop WBC normal  Remo Lipps C. Roxan Hockey, MD [facility] [phone]

## 2022-05-28 NOTE — Hospital Course (Addendum)
HPI: [name] is sent for consultation regarding a right upper lobe lung mass biopsy positive for carcinoma.   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  He was admitted to the hospital at [facility] on 03/14/2022 with the dizziness.  During his work-up he was found to have a right upper lobe lung nodule.  That was confirmed with CT.  Dizziness was felt to be cardiac in origin.  He went home after 2 days.  He had a cardiac work-up as an outpatient which included an echocardiogram which showed normal left-ventricular function.  A stress perfusion study was negative for ischemia.  PET/CT showed the right upper lobe nodule was hypermetabolic.  There were 2 mildly hypermetabolic satellite nodules.  He underwent robotic bronchoscopy.  Biopsies showed carcinoma.  It could not be further classified.   He has about a 50-pack-year history of smoking although he did quit for about 5 years after he was first diagnosed with laryngeal cancer.  Currently smoking just less than a pack a day.  He has a prescription for Wellbutrin but has not set a quit date yet.  He is retired because of his vision with macular degeneration.  Otherwise he says he would still be working.  He can walk long distances without stopping or getting short of breath.  He can walk up a flight of stairs without stopping.  No change in appetite or weight loss.  No unusual headaches or seizures.  Denies chest pain, pressure, or tightness.  Dr. Roxan Hockey reviewed the patient's chart, labs and diagnostic studies and determined surgical intervention would provide this patient the best long term treatment. Dr. Roxan Hockey reviewed the patient's treatment options as well as the risks and benefits of surgery. Jeffrey Torres was agreeable to proceed with robotic assisted right upper  lobectomy.  Hospital Course: Jeffrey Torres presented to the hospital and was brought to the operating room on 05/28/22. He underwent robotic assisted right upper lobectomy and tolerated the procedure well. He was transferred to the PACU in stable condition. CXR showed a very small right apical pneumothorax as well as base and perihilar alveolar densities. Chest tube was on water seal, there was evidence of a small intermittent air leak. His creatinine was elevated prior to surgery but began trending down. He was bradycardic so Cardizem and HCTZ were held. Surgical pathology was pending.

## 2022-05-29 ENCOUNTER — Encounter (HOSPITAL_COMMUNITY): Payer: Self-pay | Admitting: Thoracic Surgery (Cardiothoracic Vascular Surgery)

## 2022-05-29 ENCOUNTER — Inpatient Hospital Stay (HOSPITAL_COMMUNITY): Payer: Medicare HMO

## 2022-05-29 LAB — BASIC METABOLIC PANEL
Anion gap: 10 (ref 5–15)
BUN: 22 mg/dL (ref 8–23)
CO2: 26 mmol/L (ref 22–32)
Calcium: 8.6 mg/dL — ABNORMAL LOW (ref 8.9–10.3)
Chloride: 96 mmol/L — ABNORMAL LOW (ref 98–111)
Creatinine, Ser: 1.32 mg/dL — ABNORMAL HIGH (ref 0.61–1.24)
GFR, Estimated: 56 mL/min — ABNORMAL LOW (ref >60)
Glucose, Bld: 193 mg/dL — ABNORMAL HIGH (ref 70–99)
Potassium: 3.9 mmol/L (ref 3.5–5.1)
Sodium: 132 mmol/L — ABNORMAL LOW (ref 135–145)

## 2022-05-29 LAB — CBC
HCT: 37.9 % — ABNORMAL LOW (ref 39.0–52.0)
Hemoglobin: 13.2 g/dL (ref 13.0–17.0)
MCH: 31.8 pg (ref 26.0–34.0)
MCHC: 34.8 g/dL (ref 30.0–36.0)
MCV: 91.3 fL (ref 80.0–100.0)
Platelets: 222 10*3/uL (ref 150–400)
RBC: 4.15 MIL/uL — ABNORMAL LOW (ref 4.22–5.81)
RDW: 12.9 % (ref 11.5–15.5)
WBC: 15 10*3/uL — ABNORMAL HIGH (ref 4.0–10.5)
nRBC: 0 % (ref 0.0–0.2)

## 2022-05-29 MED ORDER — HYDROCHLOROTHIAZIDE 25 MG PO TABS
25.0000 mg | ORAL_TABLET | Freq: Every day | ORAL | Status: DC
  Administered 2022-05-30 – 2022-05-31 (×2): 25 mg via ORAL
  Filled 2022-05-29 (×2): qty 1

## 2022-05-29 MED ORDER — CHLORHEXIDINE GLUCONATE CLOTH 2 % EX PADS: 6.0000 | MEDICATED_PAD | Freq: Every day | CUTANEOUS | Status: DC

## 2022-05-29 MED ORDER — CHLORHEXIDINE GLUCONATE CLOTH 2 % EX PADS
6.0000 | MEDICATED_PAD | Freq: Every day | CUTANEOUS | Status: DC
  Administered 2022-05-29: 6 via TOPICAL

## 2022-05-29 MED ORDER — DILTIAZEM HCL ER COATED BEADS 120 MG PO CP24
120.0000 mg | ORAL_CAPSULE | Freq: Every day | ORAL | Status: DC
  Administered 2022-05-30 – 2022-05-31 (×2): 120 mg via ORAL
  Filled 2022-05-29 (×2): qty 1

## 2022-05-29 MED ORDER — ENOXAPARIN SODIUM 40 MG/0.4ML IJ SOSY
40.0000 mg | PREFILLED_SYRINGE | INTRAMUSCULAR | Status: DC
  Administered 2022-05-29 – 2022-05-31 (×3): 40 mg via SUBCUTANEOUS
  Filled 2022-05-29 (×3): qty 0.4

## 2022-05-29 MED ORDER — BISACODYL 5 MG PO TBEC: 5.0000 mg | DELAYED_RELEASE_TABLET | Freq: Every day | ORAL | Status: DC | PRN

## 2022-05-29 NOTE — Progress Notes (Signed)
Mobility Specialist Progress Note    05/29/22 1123  Mobility  Activity Ambulated with assistance in hallway  Level of Assistance Contact guard assist, steadying assist  Assistive Device Front wheel walker  Distance Ambulated (ft) 360 ft  Activity Response Tolerated well  Mobility Referral Yes  $Mobility charge 1 Mobility   Pre-Mobility: 55 HR, 97% SpO2 During Mobility: 66 HR, >/=90% SpO2 on RA Post-Mobility: 57 HR, 95% SpO2  Pt received in bed and agreeable. No complaints on walk. Returned to chair with call bell in reach. Tolerated on RA. RN agreeable to leave on RA.   Hildred Alamin Mobility Specialist  Please Psychologist, sport and exercise or Rehab Office at [phone]

## 2022-05-29 NOTE — TOC Progression Note (Signed)
Transition of Care [facility]) - Progression Note    Patient Details  Name: Jeffrey Torres MRN: [id number] Date of Birth: [birth year]  Transition of [facility]) CM/SW Contact  Zenon Mayo, RN Phone Number: 05/29/2022, 8:38 AM  Clinical Narrative:    From home alone, POD 1 robotic lobectomy, chest tube in place.  TOC following.         Expected Discharge Plan and Services                                                 Social Determinants of Health (SDOH) Interventions    Readmission Risk Interventions     No data to display

## 2022-05-29 NOTE — Discharge Summary (Incomplete)
Physician Discharge Summary  Patient ID: Jeffrey Torres MRN: [id number] DOB/AGE: Aug 01, 1945 76 y.o.  Admit date: 05/28/2022 Discharge date: 05/31/2022  Admission Diagnoses:  Lung cancer [facility]) History of atrial fibrillation Hypertension  Discharge Diagnoses:   Lung cancer ([place]) S/P robotic-assisted right upper lobectomy with lymph node dissection History of atrial fibrillation Hypertension  PATHOLOGY: Pending at time of discharge  Discharged Condition: stable  HPI: Jeffrey Torres is sent for consultation regarding a right upper lobe lung mass biopsy positive for carcinoma.   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  He was admitted to the hospital at [facility] on 03/14/2022 with the dizziness.  During his work-up he was found to have a right upper lobe lung nodule.  That was confirmed with CT.  Dizziness was felt to be cardiac in origin.  He went home after 2 days.  He had a cardiac work-up as an outpatient which included an echocardiogram which showed normal left-ventricular function.  A stress perfusion study was negative for ischemia.  PET/CT showed the right upper lobe nodule was hypermetabolic.  There were 2 mildly hypermetabolic satellite nodules.  He underwent robotic bronchoscopy.  Biopsies showed carcinoma.  It could not be further classified.   He has about a 50-pack-year history of smoking although he did quit for about 5 years after he was first diagnosed with laryngeal cancer.  Currently smoking just less than a pack a day.  He has a prescription for Wellbutrin but has not set a quit date yet.  He is retired because of his vision with macular degeneration.  Otherwise he says he would still be working.  He can walk long distances without stopping or getting short of breath.  He can walk up a flight of stairs without  stopping.  No change in appetite or weight loss.  No unusual headaches or seizures.  Denies chest pain, pressure, or tightness.  Dr. Roxan Hockey reviewed the patient's chart, labs and diagnostic studies and determined surgical intervention would provide this patient the best long term treatment. Dr. Roxan Hockey reviewed the patient's treatment options as well as the risks and benefits of surgery. Jeffrey Torres was agreeable to proceed with robotic assisted right upper lobectomy.  Hospital Course: Jeffrey Torres presented to the hospital and was brought to the operating room on 05/28/22. He underwent robotic assisted right upper lobectomy and tolerated the procedure well. He was transferred to the PACU in stable condition. CXR showed a very small right apical pneumothorax as well as base and perihilar alveolar densities. Chest tube was on water seal, there was evidence of a small intermittent air leak. His creatinine was elevated prior to surgery but began trending down. He was bradycardic so Cardizem and HCTZ were held initially but resumed on post-op day 2.  He remained in a stable SR with stable blood pressure. Diet and activity were advanced and well tolerated. Normal bowel and bladder function resumed. He easily regained independence with mobility and transfers. He was ready for discharge on post-op day 3.  Surgical pathology was pending.   Consults: None  Significant Diagnostic Studies:  Narrative & Impression  CLINICAL DATA:  Dizziness, lung nodule   EXAM: CT CHEST WITHOUT CONTRAST   TECHNIQUE: Multidetector CT imaging of the chest was performed following the standard protocol without IV contrast.   RADIATION DOSE REDUCTION: This exam was performed according to the departmental dose-optimization  program which includes automated exposure control, adjustment of the mA and/or kV according to patient size and/or use of iterative reconstruction technique.   COMPARISON:  Chest radiographs done on  03/14/2022   FINDINGS: Cardiovascular: Scattered coronary artery calcifications are seen.   Mediastinum/Nodes: There are few slightly enlarged lymph nodes and mediastinum measuring up to 9 mm in short axis.   Lungs/Pleura: In image 35 of series 3, there is 2 x 1.7 cm noncalcified nodule in the medial right upper lobe. There are 2-3 satellite nodules anterior and superior to the larger nodule measuring up to 8 mm in diameter. In image 55 of CC, there is 5 x 8 mm nodule in right upper lobe. There are faint ground-glass densities in mid and lower lung fields. There is no focal consolidation. There is no pleural effusion or pneumothorax.   Upper Abdomen: There is increased density in gallbladder suggesting gallbladder stones. There are a few subcentimeter nodes along the lesser curvature aspect of stomach and between the portal vein and inferior vena cava. There is mild hyperplasia of left adrenal.   Musculoskeletal: No acute findings are seen.   IMPRESSION: There is 2 cm noncalcified nodule in right upper lobe. There are other smaller nodules in right upper lobe measuring up to 8 mm in size. Findings suggest possible malignant neoplasm in right upper lobe with metastatic nodules. Follow-up PET-CT and tissue sampling should be considered.   Subcentimeter nodes are seen in mediastinum and upper abdomen. Coronary artery calcifications are seen. Gallbladder stones.     Electronically Signed   By: Elmer Picker M.D.   On: 03/15/2022 09:11   CLINICAL DATA:  Status post right VATS for lung cancer   EXAM: PORTABLE CHEST 1 VIEW   COMPARISON:  05/26/2022 chest radiograph.   FINDINGS: Low lung volumes. Top-normal heart size. Mediastinal contour within normal limits. Surgical sutures overlie the right hilum. Right apical chest tube in place. Small right apical pneumothorax, approximately 5-10%. No left pneumothorax. Streaky opacities throughout the mid to lower lungs  bilaterally. Mild subcutaneous emphysema in the lower right chest wall.   IMPRESSION: 1. Small right apical pneumothorax, approximately 5-10%. Right apical chest tube in place. 2. Low lung volumes. Streaky opacities throughout the mid to lower lungs bilaterally, favor atelectasis.   These results will be called to the ordering clinician or representative by the Radiologist Assistant, and communication documented in the PACS or [employer].     Electronically Signed   By: Ilona Sorrel M.D.   On: 05/28/2022 12:44  Treatments: surgery:  NAME: [place] RECORD NO: [id number] ACCOUNT NO: [id number] DATE OF BIRTH: 1946/06/20 FACILITY[facility] LOCATION: [facility] PHYSICIAN: Revonda Standard. Roxan Hockey, MD   Operative Report    DATE OF PROCEDURE: 05/28/2022   PREOPERATIVE DIAGNOSIS:  Non-small cell carcinoma, right upper lobe, clinical stage IA versus IIB (T1 N0 versus T3 N0).   POSTOPERATIVE DIAGNOSIS:  Non-small cell carcinoma, right upper lobe, clinical stage IA versus IIB (T1 N0 versus T3 N0).   PROCEDURE:  Xi robotic-assisted right upper lobectomy, lymph node dissection and intercostal nerve blocks levels 3 through 10.   SURGEON:  Revonda Standard. Roxan Hockey, MD   ASSISTANT:  Wynelle Beckmann, PA    Discharge Exam: Blood pressure 116/66, pulse 65, temperature 97.7 F (36.5 C), temperature source Oral, resp. rate 14, height 5\' 10"  (1.778 m), weight 89.8 kg, SpO2 97 %. Cardiovascular:  sinus rhythm in the mid 60's Pulmonary: Clear to auscultation with good respiratory effort.  Chest x-ray  showing some patchy opacities in both lungs and a small rim of air below the right diaphragm.  This has minimal change Abdomen: Soft, non tender Wounds: the port incisions are clean and dry.  Expected bruising   Disposition: Discharged to home in stable condition   Allergies as of 05/31/2022       Reactions   Penicillins Anaphylaxis, Swelling   Did it involve swelling of the  face/tongue/throat, SOB, or low BP? Yes Did it involve sudden or severe rash/hives, skin peeling, or any reaction on the inside of your mouth or nose? No Did you need to seek medical attention at a hospital or doctor's office? No When did it last happen?      10 + years ago If all above answers are "NO", may proceed with cephalosporin use.   Trazodone And Nefazodone    Caused fainting spells        Medication List     TAKE these medications    aspirin 81 MG tablet Take 81 mg by mouth daily.   buPROPion 150 MG 12 hr tablet Commonly known as: WELLBUTRIN SR Take 1 tablet (150 mg total) by mouth 2 (two) times daily.   cycloSPORINE 0.05 % ophthalmic emulsion Commonly known as: RESTASIS Place 1 drop into both eyes daily as needed (dry eyes).   diltiazem 120 MG 24 hr capsule Commonly known as: CARDIZEM CD Take 1 capsule (120 mg total) by mouth daily.   fluticasone 50 MCG/ACT nasal spray Commonly known as: FLONASE Place 2 sprays into both nostrils every morning.   gabapentin 300 MG capsule Commonly known as: NEURONTIN Take 1 capsule (300 mg total) by mouth 2 (two) times daily.   hydrochlorothiazide 25 MG tablet Commonly known as: HYDRODIURIL Take 1 tablet (25 mg total) by mouth daily.   Klor-Con M20 20 MEQ tablet Generic drug: potassium chloride SA TAKE 1 TABLET BY MOUTH EVERY DAY   omeprazole 20 MG capsule Commonly known as: PRILOSEC Take 20 mg by mouth daily as needed (acid reflux).   pravastatin 20 MG tablet Commonly known as: PRAVACHOL Take 1 tablet (20 mg total) by mouth every evening.   PRESERVISION AREDS 2 PO Take 2 capsules by mouth every morning.   ramelteon 8 MG tablet Commonly known as: Rozerem Take 1 tablet (8 mg total) by mouth at bedtime.   sertraline 100 MG tablet Commonly known as: ZOLOFT Take 1 tablet (100 mg total) by mouth daily.   sildenafil 50 MG tablet Commonly known as: Viagra Take 1 tablet (50 mg total) by mouth daily as needed for  erectile dysfunction.   tamsulosin 0.4 MG Caps capsule Commonly known as: FLOMAX Take 1 capsule (0.4 mg total) by mouth daily after supper.   traMADol 50 MG tablet Commonly known as: Ultram Take 1 tablet (50 mg total) by mouth every 6 (six) hours as needed for up to 5 days.        Follow-up Information     Melrose Nakayama, MD. Go on 06/11/2022.   Specialty: Cardiothoracic Surgery Why: Please get your chest xray at [facility], located at [address] at 3:45PM, 1 hour before your appointment at 4:45PM Contact information: [place] [address] [phone]                 Signed: Antony Odea, PA-C  05/31/2022, 12:25 PM

## 2022-05-29 NOTE — Op Note (Unsigned)
Jeffrey Torres, Jeffrey Torres MEDICAL RECORD NO: [id number] ACCOUNT NO: [id number] DATE OF BIRTH: 1945-08-09 FACILITY: [facility] LOCATION: [facility] PHYSICIAN: Revonda Standard. Roxan Hockey, MD  Operative Report   DATE OF PROCEDURE: 05/28/2022  PREOPERATIVE DIAGNOSIS:  Non-small cell carcinoma, right upper lobe, clinical stage IA versus IIB (T1 N0 versus T3 N0).  POSTOPERATIVE DIAGNOSIS:  Non-small cell carcinoma, right upper lobe, clinical stage IA versus IIB (T1 N0 versus T3 N0).  PROCEDURE:  Xi robotic-assisted right upper lobectomy, lymph node dissection and intercostal nerve blocks levels 3 through 10.  SURGEON:  Revonda Standard. Roxan Hockey, MD  ASSISTANT:  Wynelle Beckmann, PA  ANESTHESIA:  General.  FINDINGS:  Extensive fatty tissue in the hilum, bled easily.  Enlarged but otherwise benign-appearing nodes.  Bronchial margin negative for tumor.  CLINICAL NOTE:  The patient a 76 year old gentleman who was found to have a right upper lobe lung nodule.  He has a history of laryngeal cancer treated 6 years ago.  The right upper lobe nodule was positive on PET and biopsy showed a non-small cell  carcinoma.  There were satellite lesions so clinical stage is either T1 N01a or T3, N02b, depending on whether or not the satellite nodules are cancerous.  He was offered the option of surgical resection.  The indications, risks, benefits, and  alternatives were discussed in detail with the patient.  After consideration, he wished to proceed with surgery.  OPERATIVE NOTE:  The patient was brought to the preoperative holding area on 05/28/2022.  Anesthesia placed an arterial blood pressure monitoring line and established intravenous access.  He was taken to the operating room and anesthetized and intubated  with a double lumen endotracheal tube.  Intravenous antibiotics were administered.  A Foley catheter was placed.  Sequential compression devices were placed on the calves for DVT prophylaxis.  He was placed in a left lateral  decubitus position.  A Bair  Hugger was placed for active warming.  The right chest was prepped and draped in the usual sterile fashion.  Single lung ventilation of the left lung was initiated and was tolerated well throughout the procedure.  A timeout was performed.  A solution containing 20 mL of liposomal bupivacaine, 30 mL of 0.5% bupivacaine and 50 mL of saline was prepared.  This solution was used for intercostal nerve blocks as well as a direct local at the incisions sites.  An  incision was made in the eighth interspace in the midaxillary line, an 8 mm port was inserted.  The thoracoscope was inserted into the chest. After confirming intrapleural placement, carbon dioxide was insufflated per protocol.  A 12 mm port was placed  in the eighth interspace anterior to the camera port.  Intercostal nerve blocks then were performed from the third to the tenth interspace injecting 10 mL of the bupivacaine solution into a subpleural plane at each level.  A 12 mm AirSeal port was placed  in the tenth interspace anteriorly.  Two additional robotic ports were placed, a 12 mm port was placed posterolaterally and then further posteriorly, an 8 mm port was placed for the retraction arm.  The robot was deployed.  The camera arm was docked,  targeting was performed.  The remaining arms were docked.  The robotic instruments were inserted with thoracoscopic visualization.  A lymph node dissection was initiated dividing the inferior pulmonary ligament.  The pleural reflection was divided at the hilum posteriorly and level 8 and level 7 nodes were removed.  There was some minor bleeding with node  removal and Surgicel was  packed into the subcarinal space after removing the nodes.  Working further superiorly, the pleural reflection was divided over the bronchus and then between the hilar structures and the azygos vein, level 10R nodes were removed.  Pleura then was incised  superior to the azygos vein and a level 4R  node was identified and removed.  Pleural reflection then was divided at the hilum anteriorly, the phrenic nerve was identified and no cautery was used in its vicinity.  The right superior pulmonary vein was  identified.  The middle lobe branch was identified and preserved.  The upper lobe branches were dissected out, encircled and divided with the robotic stapler.  Division of the minor fissure was begun from an anterior to posterior approach using the  robotic stapler.  Attention then was turned to the fissure. The pulmonary artery was immediately visible in the fissure.  The pleura overlying it was incised and enlarged, but otherwise benign-appearing level 12 nodes were removed.  The basilar and  superior segmental branches to the lower lobe was removed. As the dissection continued, the posterior ascending branch to the upper lobe was identified.  Once that was identified, the remainder of the major fissure was completed with sequential firings  of the robotic stapler.  The posterior ascending branch then was dissected out, encircled and divided with the stapler and then division of the minor fissure was continued working from a posterior to anterior approach along the pulmonary artery with both  middle lobe branches identified and preserved. As this was opened, there was a second posterior ascending branch identified.  It was smaller than the first.  It was encircled and divided with the stapler and then the minor fissure was completed.  There  was a large common anterior apical trunk, which was divided with the robotic stapler and finally, the stapler was placed across the base of the right upper lobe bronchus at its origin and closed.  A test inflation showed good aeration of the lower and  middle lobes.  The stapler was fired transecting the bronchus.  The final robotic staple line was used to attach the middle lobe to the lower lobe to prevent torsion.  The vessel loop and sponges that had been placed  during the dissection were removed.   Chest was copiously irrigated with warm saline.  A test inflation at 30 cm water and sponges that had been placed during the dissection were removed.  The right upper lobe was manipulated into a 12 mm endoscopic retrieval bag and brought down to the  inferior aspect of the chest.  Robotic instruments were removed.  The robot was undocked.  The anterior eighth interspace incision then was lengthened to 3 cm and the right upper lobe was removed in the retrieval bag through this incision.  It was sent  for frozen section of the bronchial margin, which later returned with no tumor seen.  The chest was copiously irrigated with saline.  A test inflation to 30 cm water revealed air leakage from small tear in the superior segment.  This area was stapled  using a Psychologist, clinical.  Repeat test inflation showed no residual air leak.  A 28-French Blake drain was placed through the original port incision and directed to the apex.  It was secured to the skin with a #1 silk suture.  After a final inspection  for hemostasis, dual lung ventilation was resumed.  The incisions were closed in standard fashion.  Dermabond was  applied.  The chest tube was placed to a Pleur-Evac on waterseal.  He was placed back in a supine position.  He was extubated in the  operating room and taken to the postanesthetic care unit in good condition.  All sponge, needle and instrument counts were correct at the end of the procedure.  Experienced assistance was necessary for this case due to the surgical complexity.  Wynelle Beckmann assisted with port placement, robot docking and undocking, instrument exchange, suctioning, specimen retrieval, and wound closure.   MUK D: 05/28/2022 5:31:19 pm T: 05/29/2022 1:56:00 am  JOB: [id number]/ [id number]

## 2022-05-29 NOTE — Progress Notes (Addendum)
      [address]       [place] [address]             [phone]       1 Day Post-Op Procedure(s) (LRB): XI ROBOTIC ASSISTED THORACOSCOPY-RIGHT UPPER LOBECTOMY (Right) LYMPH NODE DISSECTION INTERCOSTAL NERVE BLOCK  Subjective: Patient states pain is fairly well controlled. He ate supper without nausea or vomiting.  Objective: Vital signs in last 24 hours: Temp:  [97 F (36.1 C)-98.9 F (37.2 C)] 97.8 F (36.6 C) (12/01 0437) Pulse Rate:  [49-70] 60 (12/01 0437) Cardiac Rhythm: Sinus bradycardia (12/01 0142) Resp:  [14-24] 17 (12/01 0437) BP: (106-145)/(57-95) 122/63 (12/01 0437) SpO2:  [88 %-98 %] 98 % (12/01 0437) Arterial Line BP: (157-166)/(52-56) 157/52 (11/30 1245)     Intake/Output from previous day: 11/30 0701 - 12/01 0700 In: 2907.4 [P.O.:240; I.V.:2167.4; IV Piggyback:500] Out: 6168 [Urine:2575; Blood:400; Chest Tube:255]   Physical Exam:  Cardiovascular: Bradycardic Pulmonary: Clear to auscultation on left and rub on right (chest tube in place) Abdomen: Soft, non tender, bowel sounds present. Extremities: Mild bilateral lower extremity edema. Wounds: Clean and dry.  No erythema or signs of infection. Chest Tube:to water seal, air leak with cough  Lab Results: CBC: Recent Labs    05/28/22 0645 05/29/22 0024  WBC 7.9 15.0*  HGB 14.7 13.2  HCT 45.7 37.9*  PLT 206 222   BMET:  Recent Labs    05/28/22 0645 05/29/22 0024  NA 139 132*  K 3.4* 3.9  CL 101 96*  CO2 25 26  GLUCOSE 113* 193*  BUN 29* 22  CREATININE 1.43* 1.32*  CALCIUM 9.3 8.6*    PT/INR:  Recent Labs    05/26/22 1530  LABPROT 13.5  INR 1.0   ABG:  INR: Will add last result for INR, ABG once components are confirmed Will add last 4 CBG results once components are confirmed  Assessment/Plan:  1. CV - History of a fib. SB/SR this am. On Cardizem CD 120 mg daily and HCTZ 25 mg daily. Will hold this am as HR 48 at time of exam and creatinine slightly  elevated 2.  Pulmonary - On room air. Chest tube with 255 cc since surgery. Chest tube is to water seal, no air leak. CXR this am appears stable (? Small right apical pneumothorax, bibasilar atelectasis). Chest tube to remain today. Check CXR in am. Encourage incentive spirometer. Await final pathology 3. Will start Lovenox for DVT prophylaxis 4. Creatinine decreased from 1.43 to 1.32. Will decrease IVF and then stop later today.  Donielle M ZimmermanPA-C 05/29/2022,6:52 AM Patient seen and examined, agree with above He had a very small intermittent air leak with cough- will leave CT in today, hopefully can get out over the weekend Pain reasonably well controlled  Remo Lipps C. Roxan Hockey, MD [facility] [phone]

## 2022-05-30 ENCOUNTER — Inpatient Hospital Stay (HOSPITAL_COMMUNITY): Payer: Medicare HMO

## 2022-05-30 LAB — COMPREHENSIVE METABOLIC PANEL
ALT: 25 U/L (ref 0–44)
AST: 55 U/L — ABNORMAL HIGH (ref 15–41)
Albumin: 3 g/dL — ABNORMAL LOW (ref 3.5–5.0)
Alkaline Phosphatase: 36 U/L — ABNORMAL LOW (ref 38–126)
Anion gap: 8 (ref 5–15)
BUN: 18 mg/dL (ref 8–23)
CO2: 25 mmol/L (ref 22–32)
Calcium: 8.5 mg/dL — ABNORMAL LOW (ref 8.9–10.3)
Chloride: 104 mmol/L (ref 98–111)
Creatinine, Ser: 1.37 mg/dL — ABNORMAL HIGH (ref 0.61–1.24)
GFR, Estimated: 54 mL/min — ABNORMAL LOW (ref >60)
Glucose, Bld: 183 mg/dL — ABNORMAL HIGH (ref 70–99)
Potassium: 3.4 mmol/L — ABNORMAL LOW (ref 3.5–5.1)
Sodium: 137 mmol/L (ref 135–145)
Total Bilirubin: 0.4 mg/dL (ref 0.3–1.2)
Total Protein: 5.6 g/dL — ABNORMAL LOW (ref 6.5–8.1)

## 2022-05-30 LAB — CBC
HCT: 38.1 % — ABNORMAL LOW (ref 39.0–52.0)
Hemoglobin: 13.4 g/dL (ref 13.0–17.0)
MCH: 32.4 pg (ref 26.0–34.0)
MCHC: 35.2 g/dL (ref 30.0–36.0)
MCV: 92 fL (ref 80.0–100.0)
Platelets: 181 10*3/uL (ref 150–400)
RBC: 4.14 MIL/uL — ABNORMAL LOW (ref 4.22–5.81)
RDW: 13 % (ref 11.5–15.5)
WBC: 11.8 10*3/uL — ABNORMAL HIGH (ref 4.0–10.5)
nRBC: 0 % (ref 0.0–0.2)

## 2022-05-30 MED ORDER — SORBITOL 70 % SOLN
30.0000 mL | Freq: Once | Status: AC
  Administered 2022-05-30: 30 mL via ORAL
  Filled 2022-05-30: qty 30

## 2022-05-30 MED ORDER — POTASSIUM CHLORIDE CRYS ER 20 MEQ PO TBCR
20.0000 meq | EXTENDED_RELEASE_TABLET | Freq: Every day | ORAL | Status: DC
  Administered 2022-05-31: 20 meq via ORAL
  Filled 2022-05-30: qty 1

## 2022-05-30 MED ORDER — POTASSIUM CHLORIDE CRYS ER 20 MEQ PO TBCR
20.0000 meq | EXTENDED_RELEASE_TABLET | ORAL | Status: AC
  Administered 2022-05-30 (×2): 20 meq via ORAL
  Filled 2022-05-30 (×2): qty 1

## 2022-05-30 NOTE — Progress Notes (Signed)
Mobility Specialist Progress Note:   05/30/22 1341  Mobility  Activity Ambulated independently in hallway  Level of Assistance Independent  Assistive Device None  Distance Ambulated (ft) 720 ft  Activity Response Tolerated well  Mobility Referral Yes  $Mobility charge 1 Mobility   Pt received ambulating in room and eager. No complaints. Pt left in bed with all needs met and call bell in reach.   Andrey Campanile Mobility Specialist Please contact via SecureChat or  Rehab office at [phone]

## 2022-05-30 NOTE — Plan of Care (Signed)

## 2022-05-30 NOTE — Progress Notes (Addendum)
      [address]       [place] [address]             [phone]       2 Days Post-Op Procedure(s) (LRB): XI ROBOTIC ASSISTED THORACOSCOPY-RIGHT UPPER LOBECTOMY (Right) LYMPH NODE DISSECTION INTERCOSTAL NERVE BLOCK  Subjective: Patient states pain is fairly well controlled. He ate supper without nausea or vomiting.  Objective: Vital signs in last 24 hours: Temp:  [97.6 F (36.4 C)-98.1 F (36.7 C)] 97.7 F (36.5 C) (12/02 0714) Pulse Rate:  [52-60] 57 (12/02 0714) Cardiac Rhythm: Normal sinus rhythm (12/02 0700) Resp:  [15-19] 19 (12/02 0714) BP: (125-168)/(61-72) 137/61 (12/02 0714) SpO2:  [95 %-100 %] 95 % (12/02 0714)     Intake/Output from previous day: 12/01 0701 - 12/02 0700 In: -  Out: 8270 [Urine:4250; Chest Tube:300]   Physical Exam:  Cardiovascular: Had a brief run of SVT yesterday evening.  Otherwise sinus rhythm, currently in the mid 60s Pulmonary: Clear to auscultation with good respiratory effort.  No airleak.  Chest x-ray is stable.  Question of a small rim of air below the right diaphragm.  This is unchanged Abdomen: Soft, non tender Wounds: Clean and dry.  Expected bruising    Lab Results: CBC: Recent Labs    05/29/22 0024 05/30/22 0014  WBC 15.0* 11.8*  HGB 13.2 13.4  HCT 37.9* 38.1*  PLT 222 181    BMET:  Recent Labs    05/29/22 0024 05/30/22 0014  NA 132* 137  K 3.9 3.4*  CL 96* 104  CO2 26 25  GLUCOSE 193* 183*  BUN 22 18  CREATININE 1.32* 1.37*  CALCIUM 8.6* 8.5*     PT/INR:  No results for input(s): "LABPROT", "INR" in the last 72 hours.  ABG:  INR: Will add last result for INR, ABG once components are confirmed Will add last 4 CBG results once components are confirmed  Assessment/Plan:  1. CV - History of a fib.  Sinus rhythm in the mid 60s.  His baseline heart rate is in the 50s..  We will resume his Cardizem CD120 daily.   2.  Pulmonary - On room air. Chest tube has no air leak and minimal  drainage.  Chest x-ray is stable.  We will remove the chest tube today and get a follow-up x-ray this afternoon.  Encourage incentive spirometer. Await final pathology 3.  On Lovenox for DVT prophylaxis 4. Creatinine stable 5.  Disposition: We will advance activity this afternoon after removing the chest tube and Foley catheter.  Anticipate discharge to home tomorrow morning if progress continues.  Joline Maxcy [phone] 05/30/2022,10:30 AM  Patient examined and follow-up chest x-ray after removal of right chest tube reviewed.  No pneumothorax.  Patient should be ready for discharge tomorrow if a.m. chest x-ray shows no change.  Will give dose of sorbitol this p.m.  patient examined and medical record reviewed,agree with above note. Dahlia Byes 05/30/2022

## 2022-05-31 ENCOUNTER — Inpatient Hospital Stay (HOSPITAL_COMMUNITY): Payer: Medicare HMO

## 2022-05-31 DIAGNOSIS — R918 Other nonspecific abnormal finding of lung field: Secondary | ICD-10-CM | POA: Diagnosis not present

## 2022-05-31 LAB — BASIC METABOLIC PANEL
Anion gap: 10 (ref 5–15)
BUN: 13 mg/dL (ref 8–23)
CO2: 30 mmol/L (ref 22–32)
Calcium: 8.6 mg/dL — ABNORMAL LOW (ref 8.9–10.3)
Chloride: 98 mmol/L (ref 98–111)
Creatinine, Ser: 1.28 mg/dL — ABNORMAL HIGH (ref 0.61–1.24)
GFR, Estimated: 58 mL/min — ABNORMAL LOW (ref >60)
Glucose, Bld: 129 mg/dL — ABNORMAL HIGH (ref 70–99)
Potassium: 4.6 mmol/L (ref 3.5–5.1)
Sodium: 138 mmol/L (ref 135–145)

## 2022-05-31 MED ORDER — TRAMADOL HCL 50 MG PO TABS: 50.0000 mg | ORAL_TABLET | Freq: Four times a day (QID) | ORAL | 0 refills | Status: AC | PRN

## 2022-05-31 MED ORDER — GABAPENTIN 300 MG PO CAPS: 300.0000 mg | ORAL_CAPSULE | Freq: Two times a day (BID) | ORAL | 1 refills | Status: DC

## 2022-05-31 NOTE — Progress Notes (Signed)
      [address]       [place] [address]             [phone]       3 Days Post-Op Procedure(s) (LRB): XI ROBOTIC ASSISTED THORACOSCOPY-RIGHT UPPER LOBECTOMY (Right) LYMPH NODE DISSECTION INTERCOSTAL NERVE BLOCK  Subjective: Patient in the bedside chair eating lunch.  Says he feels well and pain is controlled with Tylenol alone.  BM yesterday.  He would like to return home.   Objective: Vital signs in last 24 hours: Temp:  [97.7 F (36.5 C)-98.2 F (36.8 C)] 97.7 F (36.5 C) (12/03 1053) Pulse Rate:  [61-68] 65 (12/03 1053) Cardiac Rhythm: Normal sinus rhythm (12/03 0700) Resp:  [14-20] 14 (12/03 1053) BP: (116-137)/(60-73) 116/66 (12/03 1053) SpO2:  [92 %-97 %] 97 % (12/03 1053)     Intake/Output from previous day: 12/02 0701 - 12/03 0700 In: 340 [P.O.:340] Out: 1370 [Urine:1350; Chest Tube:20]   Physical Exam:  Cardiovascular:  sinus rhythm in the mid 60's Pulmonary: Clear to auscultation with good respiratory effort.  Chest x-ray showing some patchy opacities in both lungs and a small rim of air below the right diaphragm.  This has minimal change Abdomen: Soft, non tender Wounds: the port incisions are clean and dry.  Expected bruising    Lab Results: CBC: Recent Labs    05/29/22 0024 05/30/22 0014  WBC 15.0* 11.8*  HGB 13.2 13.4  HCT 37.9* 38.1*  PLT 222 181    BMET:  Recent Labs    05/30/22 0014 05/31/22 0011  NA 137 138  K 3.4* 4.6  CL 104 98  CO2 25 30  GLUCOSE 183* 129*  BUN 18 13  CREATININE 1.37* 1.28*  CALCIUM 8.5* 8.6*     CLINICAL DATA:  Status post lobectomy of the lung.   EXAM: PORTABLE CHEST 1 VIEW   COMPARISON:  May 30, 2022   FINDINGS: The heart size and mediastinal contours are stable. Stable changes of prior right upper lobectomy noted. Patchy consolidation of the medial right lung base slightly worse compared to prior exam. Interval developed patchy opacity left lung base probably due  to atelectasis. Minimal right subdiaphragmatic gas is again identified slightly increased compared prior exam. The visualized skeletal structures are stable.   IMPRESSION: 1. Patchy consolidation of the medial right lung base slightly worse compared to prior exam. 2. Interval developed patchy opacity left lung base probably due to atelectasis. 3. Minimal right subdiaphragmatic gas is identified, slightly increased compared prior exam concerning for small volume pneumoperitoneum.     Electronically Signed   By: Abelardo Diesel M.D.   On: 05/31/2022 09:12  Assessment/Plan:  -POD3 robotic-assisted right upper lobectomy for NSCLC. CT removed yesterday, no PTX on CXR but has some patchy ATX.   He is comfortable and oxygenating well on RA. He understands the importance of continuing to work on pulmonary hygiene.   -CV - History of a fib.  Maintaining sinus rhythm in the mid 60s on his usual Cardizem CD.   His baseline heart rate is in the 50s.  -GI- tolerating PO's, BM yesterday.  -Disposition- discharge to home. F/U with Dr. Roxan Hockey on 12/14 at 4:45pm.  Instructions given.    Joline Maxcy [phone] 05/31/2022,11:27 AM

## 2022-05-31 NOTE — Progress Notes (Signed)
Mobility Specialist Progress Note:   05/31/22 1012  Mobility  Activity Ambulated independently in hallway  Level of Assistance Independent  Assistive Device None  Distance Ambulated (ft) 420 ft  Activity Response Tolerated well  Mobility Referral Yes  $Mobility charge 1 Mobility   Pt received in chair and agreeable. No complaints. Pt left in chair with all needs met and call bell in reach.   Andrey Campanile Mobility Specialist Please contact via SecureChat or  Rehab office at [phone]

## 2022-05-31 NOTE — Progress Notes (Signed)
RN went over d/c summary w/ pt. NT removed PIVs. Pt awaiting family to arrive for transportation.   Belongings w/ pt. Another RN transporting pt to private vehicle where pt's family will transport pt home.

## 2022-06-01 ENCOUNTER — Telehealth: Payer: Self-pay | Admitting: *Deleted

## 2022-06-01 NOTE — Patient Outreach (Signed)
  Care Coordination [facility] Note Transition Care Management Follow-up Telephone Call Date of discharge and from where: [facility] [phone] How have you been since you were released from the hospital? Doing ok Any questions or concerns? Yes I would like to know the results of the pathology report  Items Reviewed: Did the pt receive and understand the discharge instructions provided? Yes  Medications obtained and verified? Yes  Patient has started taking his Neurontin and Tramadol Other? No  Any new allergies since your discharge? No  Dietary orders reviewed? No Do you have support at home? Yes   Home Care and Equipment/Supplies: Were home health services ordered? not applicable If so, what is the name of the agency? N   Has the agency set up a time to come to the patient's home? not applicable Were any new equipment or medical supplies ordered?  No What is the name of the medical supply agency? N/a Were you able to get the supplies/equipment? no Do you have any questions related to the use of the equipment or supplies? No  Functional Questionnaire: (I = Independent and D = Dependent) ADLs: I  Bathing/Dressing- I  Meal Prep- I  Eating- I  Maintaining continence- I  Transferring/Ambulation- I  Managing Meds- I  Follow up appointments reviewed:  PCP Hospital f/u appt confirmed? n. [facility] f/u appt confirmed? Yes  .12/05 Vernard Gambles 8:30  06/11/2022 Delight Hoh 11:00  CXR 12/14/20233:45 F/u Melrose Nakayama, MD 06/11/2022 4:45 Are transportation arrangements needed? No  If their condition worsens, is the pt aware to call PCP or go to the Emergency Dept.? Yes Was the patient provided with contact information for the PCP's office or ED? Yes Was to pt encouraged to call back with questions or concerns? Yes  SDOH assessments and interventions completed:   Yes SDOH Interventions Today    Flowsheet Row Most Recent Value  SDOH Interventions   Food Insecurity  Interventions Intervention Not Indicated  Housing Interventions Intervention Not Indicated  Transportation Interventions Intervention Not Indicated  Utilities Interventions Intervention Not Indicated       Care Coordination Interventions:  Referred for Care Coordination Services:  RN Care Coordinator  Valente David  06/18/2022 10:00  Encounter Outcome:  Pt. Visit Completed    [facility] [phone]

## 2022-06-02 ENCOUNTER — Ambulatory Visit: Payer: Medicare HMO | Admitting: Pulmonary Disease

## 2022-06-02 ENCOUNTER — Encounter: Payer: Self-pay | Admitting: Pulmonary Disease

## 2022-06-02 VITALS — BP 120/78 | HR 112 | Temp 97.5°F | Ht 70.0 in | Wt 205.2 lb

## 2022-06-02 DIAGNOSIS — R000 Tachycardia, unspecified: Secondary | ICD-10-CM

## 2022-06-02 DIAGNOSIS — Z8521 Personal history of malignant neoplasm of larynx: Secondary | ICD-10-CM

## 2022-06-02 DIAGNOSIS — C3491 Malignant neoplasm of unspecified part of right bronchus or lung: Secondary | ICD-10-CM

## 2022-06-02 NOTE — Patient Instructions (Addendum)
Your heart rate was a little high this morning.  It is however irregular.  This is probably because you are somewhat dehydrated.  I recommend that you drink plenty of fluids particularly things like Pedialyte or Gatorade diluted half-and-half with water.  Avoid drinks with the diuretic effect like coffee or tea.  Your blood pressure was excellent.  Your lungs sounded good today.  You oxygen level was good today.  As soon as we know results from from the pathologist we will let you know.  I will see you back in 4 to 6 weeks call sooner should any new problems arise.  Keep your appointments with oncology (Dr. [name]) as scheduled.

## 2022-06-02 NOTE — Progress Notes (Signed)
Subjective:    Patient ID: Jeffrey Torres, male    DOB: 1946-01-25, 76 y.o.   MRN: [id number] Patient Care Team: Birdie Sons, MD as PCP - General (Family Medicine) Kate Sable, MD as PCP - Cardiology (Cardiology) Birder Robson, MD as Referring Physician (Ophthalmology) Carloyn Manner, MD as Referring Physician (Otolaryngology) Noreene Filbert, MD as Referring Physician (Radiation Oncology) Isaias Sakai, MD as Referring Physician (Ophthalmology) Telford Nab, RN as Oncology Nurse Navigator Tyler Pita, MD as Consulting Physician (Pulmonary Disease)  Chief Complaint  Patient presents with   Follow-up    Bronchoscopy on 04/27/2022. Lobectomy on 05/28/2022. SOB with exertion. Cough with clear sputum.   HPI She is a 76 year old former smoker (26 April 2022, 50 PY) who presents for follow-up after back to me on 28 May 2022 by Dr. Merilynn Finland.  Recall that the patient had robotic assisted navigational bronchoscopy on 27 April 2022 for a right upper lobe lung nodule/mass.  Patient developed postprocedural pneumothorax and was managed with a pneumothorax catheter, he did not require admission at that time.  The tumor was exceedingly hard of the and the biopsies.  The patient underwent right upper lobectomy on 28 May 2022, pathology reports are still pending.  Patient presents today for follow-up to assess his respiratory status.  He has been mostly "weak" after lobectomy.  He has mild shortness of breath with exertion.  Does not describe it as more than prior to surgery.  Cough is productive of clear sputum no hemoptysis.  No purulence.  He has had no fevers, chills or sweats since his lobectomy.  Does not endorse any other symptomatology today.  He has had no wheezing.  Does not endorse much post lobectomy pain, this is manageable.  No orthostatic symptoms.  Review of Systems A 10 point review of systems was performed and it is as noted above otherwise  negative.  Patient Active Problem List   Diagnosis Date Noted   Lung cancer ([place]) 05/28/2022   Depression 04/22/2022   Right upper lobe pulmonary nodule 03/20/2022   Dizziness 03/14/2022   Hypokalemia 03/14/2022   Hypernatremia 07/31/2020   History of colonic polyps    Severe obstructive sleep apnea 02/21/2020   Primary hypertension 08/14/2019   Atrial bigeminy 08/14/2019   History of laryngeal cancer 12/28/2017   History of adenomatous polyp of colon 10/30/2015   Inguinal hernia, bilateral 10/21/2015   PVC (premature ventricular contraction) 10/21/2015   Tobacco dependence 01/75/1025   Umbilical hernia 85/27/7824   Social History   Tobacco Use   Smoking status: Some Days    Packs/day: 1.00    Years: 50.00    Total pack years: 50.00    Types: Cigarettes    Start date: 1973   Smokeless tobacco: Never   Tobacco comments:    started age 34 yo.previously smoked carton a week    3/4 of a pack a day. 04/21/2022 khj    Quit smoking 04/26/2022 khj  Substance Use Topics   Alcohol use: Not Currently    Alcohol/week: 0.0 standard drinks of alcohol   Allergies  Allergen Reactions   Penicillins Anaphylaxis and Swelling    Did it involve swelling of the face/tongue/throat, SOB, or low BP? Yes Did it involve sudden or severe rash/hives, skin peeling, or any reaction on the inside of your mouth or nose? No Did you need to seek medical attention at a hospital or doctor's office? No When did it last happen?      10 +  years ago If all above answers are "NO", may proceed with cephalosporin use.    Trazodone And Nefazodone     Caused fainting spells   Current Meds  Medication Sig   aspirin 81 MG tablet Take 81 mg by mouth daily.   buPROPion (WELLBUTRIN SR) 150 MG 12 hr tablet Take 1 tablet (150 mg total) by mouth 2 (two) times daily.   cycloSPORINE (RESTASIS) 0.05 % ophthalmic emulsion Place 1 drop into both eyes daily as needed (dry eyes).   diltiazem (CARDIZEM CD) 120 MG 24 hr  capsule Take 1 capsule (120 mg total) by mouth daily.   fluticasone (FLONASE) 50 MCG/ACT nasal spray Place 2 sprays into both nostrils every morning.    gabapentin (NEURONTIN) 300 MG capsule Take 1 capsule (300 mg total) by mouth 2 (two) times daily.   hydrochlorothiazide (HYDRODIURIL) 25 MG tablet Take 1 tablet (25 mg total) by mouth daily.   KLOR-CON M20 20 MEQ tablet TAKE 1 TABLET BY MOUTH EVERY DAY   Multiple Vitamins-Minerals (PRESERVISION AREDS 2 PO) Take 2 capsules by mouth every morning.    omeprazole (PRILOSEC) 20 MG capsule Take 20 mg by mouth daily as needed (acid reflux).   pravastatin (PRAVACHOL) 20 MG tablet Take 1 tablet (20 mg total) by mouth every evening.   ramelteon (ROZEREM) 8 MG tablet Take 1 tablet (8 mg total) by mouth at bedtime.   sertraline (ZOLOFT) 100 MG tablet Take 1 tablet (100 mg total) by mouth daily.   sildenafil (VIAGRA) 50 MG tablet Take 1 tablet (50 mg total) by mouth daily as needed for erectile dysfunction.   traMADol (ULTRAM) 50 MG tablet Take 1 tablet (50 mg total) by mouth every 6 (six) hours as needed for up to 5 days.   Immunization History  Administered Date(s) Administered   Fluad Quad(high Dose 65+) 08/07/2020, 03/18/2021, 03/17/2022   Influenza, High Dose Seasonal PF 03/25/2017, 03/29/2018, 04/24/2019   Pneumococcal Conjugate-13 04/26/2014   Pneumococcal Polysaccharide-23 05/28/2012      Objective:   Physical Exam BP 120/78 (BP Location: Left Arm, Cuff Size: Normal)   Pulse (!) 112   Temp (!) 97.5 F (36.4 C)   Ht 5\' 10"  (1.778 m)   Wt 205 lb 3.2 oz (93.1 kg)   SpO2 96%   BMI 29.44 kg/m  GENERAL: Well-developed, overweight gentleman in no acute distress, fully ambulatory, no conversational dyspnea.  Fluent speech. HEAD: Normocephalic, atraumatic.  EYES: Pupils equal, round, reactive to light.  No scleral icterus.  MOUTH: Oral mucosa moist.  No thrush. NECK: Supple. No thyromegaly. Trachea midline. No JVD.  No adenopathy.  Mild subcu  emphysema on right neck. PULMONARY: Symmetrical lung sounds good air entry bilaterally.  No adventitious sounds. CARDIOVASCULAR: S1 and S2.  Tachycardic rate regular rhythm, occasional extrasystoles.  No rubs, murmurs or gallops heard.  Good capillary refill. ABDOMEN: Soft, nondistended, nontender. MUSCULOSKELETAL: No joint deformity, no clubbing, no edema.  NEUROLOGIC: No overt focal deficit, no gait disturbance, speech is fluent. SKIN: Intact,warm,dry.  Nailbeds pink. PSYCH: Mood and behavior normal     Assessment & Plan:     ICD-10-CM   1. Non-small cell cancer of right lung [facility])  C34.91    Pathology specimen still pending Patient had lobectomy in 11/30 Has upcoming oncology appointment    2. Tachycardia  R00.0    May represent mild volume depletion Last    3. History of laryngeal cancer  Z85.21    Status post XRT No evidence of recurrence per  last ENT evaluation     Patient's pathology specimen is still in process.  We will notify the patient results once these are available.  He was noted to be tachycardic, today was the first time that he had walked as much after surgery.  Was advised to stay well-hydrated.  Not have any orthostatic symptoms and blood pressure was stable.  He was advised to check oxygen saturations and pulse at home (he does have an oximeter) and presented to emergency room if his heart rate does not respond to hydration.  Most recent lab work did not show significant postop anemia and electrolytes were stable.  Will see the patient in follow-up in 4 to 6 weeks time he is to contact us prior to that time should any new difficulties arise.   [name], MD Advanced Bronchoscopy [facility]    *This note was dictated using voice recognition software/Dragon.  Despite best efforts to proofread, errors can occur which can change the meaning. Any transcriptional errors that result from this process are unintentional and may not be  fully corrected at the time of dictation.

## 2022-06-04 LAB — SURGICAL PATHOLOGY

## 2022-06-10 ENCOUNTER — Other Ambulatory Visit: Payer: Self-pay | Admitting: Cardiothoracic Surgery

## 2022-06-10 DIAGNOSIS — C3490 Malignant neoplasm of unspecified part of unspecified bronchus or lung: Secondary | ICD-10-CM

## 2022-06-11 ENCOUNTER — Ambulatory Visit (INDEPENDENT_AMBULATORY_CARE_PROVIDER_SITE_OTHER): Payer: Self-pay | Admitting: Thoracic Surgery (Cardiothoracic Vascular Surgery)

## 2022-06-11 ENCOUNTER — Inpatient Hospital Stay: Payer: Medicare HMO | Attending: Oncology | Admitting: Oncology

## 2022-06-11 ENCOUNTER — Encounter: Payer: Self-pay | Admitting: Oncology

## 2022-06-11 ENCOUNTER — Encounter: Payer: Self-pay | Admitting: *Deleted

## 2022-06-11 VITALS — BP 143/70 | HR 56 | Temp 99.0°F | Resp 18 | Ht 70.0 in | Wt 206.5 lb

## 2022-06-11 DIAGNOSIS — Z08 Encounter for follow-up examination after completed treatment for malignant neoplasm: Secondary | ICD-10-CM | POA: Diagnosis not present

## 2022-06-11 DIAGNOSIS — Z8521 Personal history of malignant neoplasm of larynx: Secondary | ICD-10-CM | POA: Insufficient documentation

## 2022-06-11 DIAGNOSIS — C3411 Malignant neoplasm of upper lobe, right bronchus or lung: Secondary | ICD-10-CM | POA: Diagnosis not present

## 2022-06-11 DIAGNOSIS — C3491 Malignant neoplasm of unspecified part of right bronchus or lung: Secondary | ICD-10-CM

## 2022-06-11 DIAGNOSIS — C3490 Malignant neoplasm of unspecified part of unspecified bronchus or lung: Secondary | ICD-10-CM

## 2022-06-11 NOTE — Progress Notes (Signed)
[address]       [place] [address]             [phone]    This visit was conducted as a telephone visit at the patient's request.  The person who was going to drive him to his office visit today has a respiratory illness.  2 identifiers used to confirm identity.  Patient location: Home MD location: Office   HPI:  Jeffrey Torres is a 76 year old man with a history of tobacco abuse, laryngeal cancer treated with radiation, atrial fibrillation, arthritis, anxiety, reflux, and macular degeneration.  Hospitalized in September with dizziness and found to have a right upper lobe lung nodule.  PET/CT showed the nodule was hypermetabolic.  There were 2 mildly hypermetabolic satellite nodules.  It was unclear if these were tumors or areas of postobstructive changes.  Dr. Patsey Berthold did robotic bronchoscopy which showed carcinoma.  I did a robotic assisted right upper lobectomy on 05/28/2022.  Pathology showed a poorly differentiated squamous cell carcinoma.  Pathologic stage was 1B (T2a, N0)  due to visceral pleural invasion.  His postoperative course was uncomplicated and he went home on day 3.  Since discharge he has seen Dr. Patsey Berthold.  He also had an appointment with Dr. [name] today.  He will not require any adjuvant chemo or radiation.  He has some discomfort but is not taking anything for it including Tylenol.  He has not used any narcotics since discharge.  Some mild shortness of breath with activity, but overall feels well.  Past Medical History:  Diagnosis Date   Acute metabolic encephalopathy 57/84/6962   Anxiety    Arthritis    Atrial fibrillation (HCC)    Cancer (HCC)    larynx ca, surgery and radiation   GERD (gastroesophageal reflux disease)    Heart murmur    Hernia, inguinal, bilateral    History of chicken pox    History of kidney stones    History of measles    History of mumps    History of shingles    HOH (hard of hearing)    Hypertension     Macular degeneration of both eyes    Motion sickness    ocean boat   Pneumonia due to COVID-19 virus 07/31/2020   PVC (premature ventricular contraction)     Current Outpatient Medications  Medication Sig Dispense Refill   aspirin 81 MG tablet Take 81 mg by mouth daily.     buPROPion (WELLBUTRIN SR) 150 MG 12 hr tablet Take 1 tablet (150 mg total) by mouth 2 (two) times daily. 180 tablet 2   cycloSPORINE (RESTASIS) 0.05 % ophthalmic emulsion Place 1 drop into both eyes daily as needed (dry eyes).     diltiazem (CARDIZEM CD) 120 MG 24 hr capsule Take 1 capsule (120 mg total) by mouth daily. 90 capsule 3   fluticasone (FLONASE) 50 MCG/ACT nasal spray Place 2 sprays into both nostrils every morning.      gabapentin (NEURONTIN) 300 MG capsule Take 1 capsule (300 mg total) by mouth 2 (two) times daily. 30 capsule 1   hydrochlorothiazide (HYDRODIURIL) 25 MG tablet Take 1 tablet (25 mg total) by mouth daily. 90 tablet 3   KLOR-CON M20 20 MEQ tablet TAKE 1 TABLET BY MOUTH EVERY DAY 90 tablet 4   Multiple Vitamins-Minerals (PRESERVISION AREDS 2 PO) Take 2 capsules by mouth every morning.      omeprazole (PRILOSEC) 20 MG capsule Take 20 mg by  mouth daily as needed (acid reflux).     pravastatin (PRAVACHOL) 20 MG tablet Take 1 tablet (20 mg total) by mouth every evening. 30 tablet 3   ramelteon (ROZEREM) 8 MG tablet Take 1 tablet (8 mg total) by mouth at bedtime. 30 tablet 2   sertraline (ZOLOFT) 100 MG tablet Take 1 tablet (100 mg total) by mouth daily. 90 tablet 1   sildenafil (VIAGRA) 50 MG tablet Take 1 tablet (50 mg total) by mouth daily as needed for erectile dysfunction. 30 tablet 2   tamsulosin (FLOMAX) 0.4 MG CAPS capsule Take 1 capsule (0.4 mg total) by mouth daily after supper. (Patient not taking: Reported on 05/20/2022) 30 capsule 1   No current facility-administered medications for this visit.    Physical Exam No physical exam due to telephone visit  Diagnostic Tests: Pathology  showed a T2a, N0, stage Ib poorly differentiated carcinoma consistent with squamous cell carcinoma  Impression: Jeffrey Torres is a 76 year old man with a history of tobacco abuse, laryngeal cancer treated with radiation, atrial fibrillation, arthritis, anxiety, reflux, macular degeneration, and a stage Ib squamous cell carcinoma of the right upper lobe, status post right upper lobectomy.  Stage Ib squamous cell carcinoma of the right upper lobe-he saw Dr. [name].  No adjuvant therapy indicated.  Follow-up as scheduled.  Status post right upper lobectomy-doing extremely well.  Not having to take anything for pain.  Noticeable decrease in respiratory capacity postoperatively which is to be expected.  Should improve to some degree with time although will never be 100% after the resection.   Plan: I will plan to see him back in the office with a chest x-ray in about 3 weeks to check on his progress He knows to call if he needs anything in the meantime.  I spent approximately 10 minutes in review of records and in conversation with Jeffrey Torres today.  Melrose Nakayama, MD [facility] [phone]

## 2022-06-11 NOTE — Progress Notes (Signed)
[place]  [phone] [phone]  ID: Jeffrey Torres OB: 06-22-1946  MR#: [id number]  MBW#:[id number]  Patient Care Team: Birdie Sons, MD as PCP - General (Family Medicine) Kate Sable, MD as PCP - Cardiology (Cardiology) Birder Robson, MD as Referring Physician (Ophthalmology) Carloyn Manner, MD as Referring Physician (Otolaryngology) Noreene Filbert, MD as Referring Physician (Radiation Oncology) Isaias Sakai, MD as Referring Physician (Ophthalmology) Telford Nab, RN as Oncology Nurse Navigator Tyler Pita, MD as Consulting Physician (Pulmonary Disease)  CHIEF COMPLAINT: Stage Ib adenocarcinoma of the lung.  INTERVAL HISTORY: Patient underwent surgical resection of his lung mass on May 28, 2022 revealing the above stated malignancy.  He is nearly fully recovered from his surgery.  He currently feels well. He denies any recent fevers or illnesses.  He has a good appetite and denies weight loss.  He has no chest pain, shortness of breath, cough, or hemoptysis.  He denies any nausea, vomiting, constipation, or diarrhea.  He has no urinary complaints.  Patient offers no specific complaints today.  REVIEW OF SYSTEMS:   Review of Systems  Constitutional: Negative.  Negative for fever, malaise/fatigue and weight loss.  Respiratory: Negative.  Negative for cough, hemoptysis and shortness of breath.   Cardiovascular: Negative.  Negative for chest pain and leg swelling.  Gastrointestinal: Negative.  Negative for abdominal pain.  Genitourinary: Negative.  Negative for dysuria.  Musculoskeletal: Negative.  Negative for back pain.  Skin: Negative.  Negative for rash.  Neurological: Negative.  Negative for dizziness, focal weakness, weakness and headaches.  Psychiatric/Behavioral: Negative.  The patient is not nervous/anxious.     As per HPI. Otherwise, a complete review of systems is negative.  PAST MEDICAL  HISTORY: Past Medical History:  Diagnosis Date   Acute metabolic encephalopathy 01/77/9390   Anxiety    Arthritis    Atrial fibrillation ([place])    Cancer (HCC)    larynx ca, surgery and radiation   GERD (gastroesophageal reflux disease)    Heart murmur    Hernia, inguinal, bilateral    History of chicken pox    History of kidney stones    History of measles    History of mumps    History of shingles    HOH (hard of hearing)    Hypertension    Macular degeneration of both eyes    Motion sickness    ocean boat   Pneumonia due to COVID-19 virus 07/31/2020   PVC (premature ventricular contraction)     PAST SURGICAL HISTORY: Past Surgical History:  Procedure Laterality Date   CARDIAC CATHETERIZATION  2010   normal per patient report   CATARACT EXTRACTION W/PHACO Right 07/18/2019   Procedure: CATARACT EXTRACTION PHACO AND INTRAOCULAR LENS PLACEMENT ([place]) RIGHT 4.54 00:29.7;  Surgeon: Birder Robson, MD;  Location: [place];  Service: Ophthalmology;  Laterality: Right;   CATARACT EXTRACTION W/PHACO Left 08/08/2019   Procedure: CATARACT EXTRACTION PHACO AND INTRAOCULAR LENS PLACEMENT ([place]) LEFT;  Surgeon: Birder Robson, MD;  Location: [facility];  Service: Ophthalmology;  Laterality: Left;  Lot #[id number] H [place]: 00:34.5 CDE: 4.75   COLONOSCOPY  06/24/2006   Dr. Bary Castilla. Multiple benign appearing 73mm polyps in the cecum and in the rectum. -Three 49mm polyps in the transversed colon, in the tranverse colon, proximal and in the distal transverse colon. Resected and retrieved.   COLONOSCOPY WITH PROPOFOL N/A 03/12/2020   Procedure: COLONOSCOPY WITH PROPOFOL;  Surgeon: Lucilla Lame, MD;  Location[facility] END[facility]COPY;  Service: Endoscopy;  Laterality:  N/A;   CYSTOSCOPY WITH HOLMIUM LASER LITHOTRIPSY  2001   kidney stones removed, [facility]   HERNIA REPAIR     umbilical and bilateral inguninal   INTERCOSTAL NERVE BLOCK  05/28/2022   Procedure: INTERCOSTAL NERVE BLOCK;  Surgeon:  Melrose Nakayama, MD;  Location: [place];  Service: Thoracic;;   LARYNGOSCOPY Right 04/01/2017   Procedure: SUSPENSION LARYNGOSCOPY WITH MICROFLAP EXCISION;  Surgeon: Carloyn Manner, MD;  Location: [facility];  Service: ENT;  Laterality: Right;   LYMPH NODE DISSECTION  05/28/2022   Procedure: LYMPH NODE DISSECTION;  Surgeon: Melrose Nakayama, MD;  Location: [place];  Service: Thoracic;;   sinus surgery   2002   [place]; removal of polyps   TONSILLECTOMY  1954   Tubular adenoma removed  06/24/2006    FAMILY HISTORY: Family History  Problem Relation Age of Onset   Lung cancer Mother    Heart disease Father     ADVANCED DIRECTIVES (Y/N):  N  HEALTH MAINTENANCE: Social History   Tobacco Use   Smoking status: Some Days    Packs/day: 1.00    Years: 50.00    Total pack years: 50.00    Types: Cigarettes    Start date: 1973   Smokeless tobacco: Never   Tobacco comments:    started age 70 yo.previously smoked carton a week    3/4 of a pack a day. 04/21/2022 khj    Quit smoking 04/26/2022 khj  Vaping Use   Vaping Use: Never used  Substance Use Topics   Alcohol use: Not Currently    Alcohol/week: 0.0 standard drinks of alcohol   Drug use: No     Colonoscopy:  PAP:  Bone density:  Lipid panel:  Allergies  Allergen Reactions   Penicillins Anaphylaxis and Swelling    Did it involve swelling of the face/tongue/throat, SOB, or low BP? Yes Did it involve sudden or severe rash/hives, skin peeling, or any reaction on the inside of your mouth or nose? No Did you need to seek medical attention at a hospital or doctor's office? No When did it last happen?      10 + years ago If all above answers are "NO", may proceed with cephalosporin use.    Trazodone And Nefazodone     Caused fainting spells    Current Outpatient Medications  Medication Sig Dispense Refill   aspirin 81 MG tablet Take 81 mg by mouth daily.     buPROPion (WELLBUTRIN SR) 150 MG 12 hr tablet Take 1  tablet (150 mg total) by mouth 2 (two) times daily. 180 tablet 2   cycloSPORINE (RESTASIS) 0.05 % ophthalmic emulsion Place 1 drop into both eyes daily as needed (dry eyes).     diltiazem (CARDIZEM CD) 120 MG 24 hr capsule Take 1 capsule (120 mg total) by mouth daily. 90 capsule 3   fluticasone (FLONASE) 50 MCG/ACT nasal spray Place 2 sprays into both nostrils every morning.      gabapentin (NEURONTIN) 300 MG capsule Take 1 capsule (300 mg total) by mouth 2 (two) times daily. 30 capsule 1   hydrochlorothiazide (HYDRODIURIL) 25 MG tablet Take 1 tablet (25 mg total) by mouth daily. 90 tablet 3   KLOR-CON M20 20 MEQ tablet TAKE 1 TABLET BY MOUTH EVERY DAY 90 tablet 4   Multiple Vitamins-Minerals (PRESERVISION AREDS 2 PO) Take 2 capsules by mouth every morning.      omeprazole (PRILOSEC) 20 MG capsule Take 20 mg by mouth daily as needed (acid reflux).  pravastatin (PRAVACHOL) 20 MG tablet Take 1 tablet (20 mg total) by mouth every evening. 30 tablet 3   ramelteon (ROZEREM) 8 MG tablet Take 1 tablet (8 mg total) by mouth at bedtime. 30 tablet 2   sertraline (ZOLOFT) 100 MG tablet Take 1 tablet (100 mg total) by mouth daily. 90 tablet 1   sildenafil (VIAGRA) 50 MG tablet Take 1 tablet (50 mg total) by mouth daily as needed for erectile dysfunction. 30 tablet 2   tamsulosin (FLOMAX) 0.4 MG CAPS capsule Take 1 capsule (0.4 mg total) by mouth daily after supper. (Patient not taking: Reported on 05/20/2022) 30 capsule 1   No current facility-administered medications for this visit.    OBJECTIVE: Vitals:   06/11/22 1052  BP: (!) 143/70  Pulse: (!) 56  Resp: 18  Temp: 99 F (37.2 C)  SpO2: 100%     Body mass index is 29.63 kg/m.    ECOG FS:0 - Asymptomatic  General: Well-developed, well-nourished, no acute distress. Eyes: Pink conjunctiva, anicteric sclera. HEENT: Normocephalic, moist mucous membranes. Lungs: No audible wheezing or coughing. Heart: Regular rate and rhythm. Abdomen: Soft,  nontender, no obvious distention. Musculoskeletal: No edema, cyanosis, or clubbing. Neuro: Alert, answering all questions appropriately. Cranial nerves grossly intact. Skin: No rashes or petechiae noted. Psych: Normal affect.   LAB RESULTS:  Lab Results  Component Value Date   NA 138 05/31/2022   K 4.6 05/31/2022   CL 98 05/31/2022   CO2 30 05/31/2022   GLUCOSE 129 (H) 05/31/2022   BUN 13 05/31/2022   CREATININE 1.28 (H) 05/31/2022   CALCIUM 8.6 (L) 05/31/2022   PROT 5.6 (L) 05/30/2022   ALBUMIN 3.0 (L) 05/30/2022   AST 55 (H) 05/30/2022   ALT 25 05/30/2022   ALKPHOS 36 (L) 05/30/2022   BILITOT 0.4 05/30/2022   GFRNONAA 58 (L) 05/31/2022   GFRAA 86 08/07/2020    Lab Results  Component Value Date   WBC 11.8 (H) 05/30/2022   NEUTROABS 5.3 07/07/2020   HGB 13.4 05/30/2022   HCT 38.1 (L) 05/30/2022   MCV 92.0 05/30/2022   PLT 181 05/30/2022     STUDIES: DG CHEST PORT 1 VIEW  Result Date: 05/31/2022 CLINICAL DATA:  Status post lobectomy of the lung. EXAM: PORTABLE CHEST 1 VIEW COMPARISON:  May 30, 2022 FINDINGS: The heart size and mediastinal contours are stable. Stable changes of prior right upper lobectomy noted. Patchy consolidation of the medial right lung base slightly worse compared to prior exam. Interval developed patchy opacity left lung base probably due to atelectasis. Minimal right subdiaphragmatic gas is again identified slightly increased compared prior exam. The visualized skeletal structures are stable. IMPRESSION: 1. Patchy consolidation of the medial right lung base slightly worse compared to prior exam. 2. Interval developed patchy opacity left lung base probably due to atelectasis. 3. Minimal right subdiaphragmatic gas is identified, slightly increased compared prior exam concerning for small volume pneumoperitoneum. Electronically Signed   By: Abelardo Diesel M.D.   On: 05/31/2022 09:12   DG CHEST PORT 1 VIEW  Result Date: 05/30/2022 CLINICAL DATA:   [id number] Status post lobectomy of lung [id number] EXAM: PORTABLE CHEST - 1 VIEW COMPARISON:  None Available. FINDINGS: Similar appearing postsurgical changes after right upper lobectomy. Interval removal of indwelling right thoracostomy tube. No evidence of pneumothorax. Slight interval decreased conspicuity of previously visualized subdiaphragmatic crescentic gas. Similar appearing bibasilar subsegmental atelectasis. No acute osseous abnormality. IMPRESSION: 1. Persistent but decreased conspicuity of crescentic right sub diaphragmatic gas concern  for small volume pneumoperitoneum. Again, recommend attention on follow-up. 2. No evidence of right pneumothorax after removal of right thoracostomy tube. 3. Similar appearing postsurgical changes after right upper lobectomy with persistent bibasilar subsegmental atelectasis. Electronically Signed   By: Ruthann Cancer M.D.   On: 05/30/2022 14:13   DG CHEST PORT 1 VIEW  Result Date: 05/30/2022 CLINICAL DATA:  [id number] Pneumothorax [id number] EXAM: PORTABLE CHEST - 1 VIEW COMPARISON:  05/29/2022, 05/28/2022, 05/26/2022 FINDINGS: Similar appearing postsurgical changes after right upper lobectomy. Right thoracostomy tube in unchanged position. Previously described apical pneumothorax is inconspicuous. Similar appearing suggestion of subdiaphragmatic crescentic gas, similar to most recent comparison. Bibasilar subsegmental atelectasis, slightly increased from comparison, likely due to poor inspiratory effort. No new focal consolidations. No acute osseous abnormality. IMPRESSION: 1. Concern for small pneumoperitoneum versus small volume sub pulmonic pneumothorax. Recommend attention on follow-up. CT abdomen pelvis could be considered in the appropriate clinical setting. 2. Resolution of previously visualized apical pneumothorax with unchanged position of indwelling thoracostomy tube. 3. Similar appearing postsurgical changes after right upper lobectomy with slight interval increased  bibasilar subsegmental atelectasis. These results were called by telephone at the time of interpretation on 05/30/2022 at 7:59 am to provider Dr. Nils Pyle, Who verbally acknowledged these results. Ruthann Cancer, MD [facility] Electronically Signed   By: Ruthann Cancer M.D.   On: 05/30/2022 08:09   DG Chest Port 1 View  Result Date: 05/29/2022 CLINICAL DATA:  Follow-up pneumothorax EXAM: PORTABLE CHEST 1 VIEW COMPARISON:  05/28/2022. FINDINGS: Right perihilar alveolar process. Right base consolidation. Vascular congestion. Enlarged cardiac silhouette. Calcified aorta. Tiny right apical pneumothorax has decreased in size. IMPRESSION: Base and perihilar alveolar densities and enlarged cardiac silhouette. Very small right apical pneumothorax has diminished compared to prior study. Electronically Signed   By: Sammie Bench M.D.   On: 05/29/2022 08:13   DG Chest Port 1 View  Result Date: 05/28/2022 CLINICAL DATA:  Status post right VATS for lung cancer EXAM: PORTABLE CHEST 1 VIEW COMPARISON:  05/26/2022 chest radiograph. FINDINGS: Low lung volumes. Top-normal heart size. Mediastinal contour within normal limits. Surgical sutures overlie the right hilum. Right apical chest tube in place. Small right apical pneumothorax, approximately 5-10%. No left pneumothorax. Streaky opacities throughout the mid to lower lungs bilaterally. Mild subcutaneous emphysema in the lower right chest wall. IMPRESSION: 1. Small right apical pneumothorax, approximately 5-10%. Right apical chest tube in place. 2. Low lung volumes. Streaky opacities throughout the mid to lower lungs bilaterally, favor atelectasis. These results will be called to the ordering clinician or representative by the Radiologist Assistant, and communication documented in the PACS or [employer]. Electronically Signed   By: Ilona Sorrel M.D.   On: 05/28/2022 12:44   DG Chest 2 View  Result Date:  05/27/2022 CLINICAL DATA:  Preop chest x-ray. EXAM: CHEST - 2 VIEW COMPARISON:  Chest x-ray April 30, 2022, chest CT April 24, 2022 FINDINGS: The heart size and mediastinal contours are within normal limits. 2 cm right upper lobe nodule is unchanged compared to prior CT chest. There is no pulmonary edema, focal pneumonia or pleural effusion. The visualized skeletal structures are stable. Chronic posttraumatic changes of several right ribs are stable. IMPRESSION: No active cardiopulmonary disease. 2 cm right upper lobe nodule is unchanged compared to prior CT chest. Patient had recent bronchoscopy with biopsy. Management on clinical basis. Electronically Signed   By: Abelardo Diesel M.D.   On: 05/27/2022 10:58    ASSESSMENT: Stage  Ib adenocarcinoma of the lung.  PLAN:    Stage Ib adenocarcinoma of the lung: Patient underwent surgical resection on May 28, 2022.  Patient has slight invasion of pleura, but no metastatic disease noted in lymph nodes.  No intervention is needed at this time.  Patient does not require adjuvant XRT or chemotherapy.  Return to clinic in 3 months with repeat CT scan of chest and further evaluation.  If everything remains negative at that point, patient can be transition to imaging and evaluation every 6 months. Laryngeal cancer: Patient diagnosed with a stage I squamous cell carcinoma in 2019 that was treated with XRT only.  Patient reports his most recent endoscopy revealed no evidence of disease. Cardiac disease: Continue close follow-up with cardiology.  I spent a total of 30 minutes reviewing chart data, face-to-face evaluation with the patient, counseling and coordination of care as detailed above.   Patient expressed understanding and was in agreement with this plan. He also understands that He can call clinic at any time with any questions, concerns, or complaints.    Cancer Staging  Lung cancer [facility]) Staging form: Lung, AJCC 8th Edition - Clinical stage from  06/11/2022: Stage IB (cT2a, cN0, cM0) - Signed by Lloyd Huger, MD on 06/11/2022 Stage prefix: Initial diagnosis   Lloyd Huger, MD   06/11/2022 1:27 PM

## 2022-06-12 ENCOUNTER — Other Ambulatory Visit: Payer: Self-pay

## 2022-06-12 NOTE — Progress Notes (Signed)
Disregard entry

## 2022-06-18 ENCOUNTER — Ambulatory Visit: Payer: Self-pay | Admitting: *Deleted

## 2022-06-18 NOTE — Patient Instructions (Signed)
Visit Information  Thank you for taking time to visit with me today. Please don't hesitate to contact me if I can be of assistance to you before our next scheduled telephone appointment.  Following are the goals we discussed today:  Continue taking medications as instructed. Monitor blood pressure, notify provider if pain does not get better.   Our next appointment is by telephone on 3/22  Please call the care guide team at [phone] if you need to cancel or reschedule your appointment.   Please call the Suicide and Crisis Lifeline: 988 call the [place] National Suicide Prevention Lifeline: (863) 095-8945 or TTY: (319) 588-4089 TTY 757-718-0127) to talk to a trained counselor call 1-800-273-TALK (toll free, 24 hour hotline) call 911 if you are experiencing a Mental Health or [place] or need someone to talk to.  Patient verbalizes understanding of instructions and care plan provided today and agrees to view in [place]. Active MyChart status and patient understanding of how to access instructions and care plan via MyChart confirmed with patient.     The patient has been provided with contact information for the care management team and has been advised to call with any health related questions or concerns.   Valente David, RN, MSN, [place] Care Management Care Management Coordinator [phone]

## 2022-06-18 NOTE — Patient Outreach (Signed)
  Care Coordination   Initial Visit Note   06/18/2022 Name: Jeffrey Torres MRN: [id number] DOB: 12/26/1945  Jeffrey Torres is a 76 y.o. year old male who sees Fisher, Kirstie Peri, MD for primary care. I spoke with  [name] by phone today.  What matters to the patients health and wellness today?  Status post right upper lobe lobectomy, state pain is better and bearable.  Some shortness of breath with activity but this is expected.     Goals Addressed             This Visit's Progress    Adjust to New diagnosis of lung cancer   On track    Care Coordination Interventions: Assessed patient understanding of cancer diagnosis and recommended treatment plan Reviewed upcoming provider appointments and treatment appointments Assessed available transportation to appointments and treatments. Has consistent/reliable transportation: Yes Assessed support system. Has consistent/reliable family or other support: Yes Received results of lung biopsy/surgery, will not need chemo or radiation. He will continue follow up with oncology and pulmonary providers.  Appointment with thoracic surgeon on 1/4, with pulmonary on 1/17, and repeat CT scan on 3/11.  PCP office will call to schedule 6 month follow up, last seen on 10/25          SDOH assessments and interventions completed:  No     Care Coordination Interventions:  Yes, provided   Follow up plan: Follow up call scheduled for 3/22    Encounter Outcome:  Pt. Visit Completed   Valente David, RN, MSN, [place] Care Management Care Management Coordinator [phone]

## 2022-06-22 ENCOUNTER — Observation Stay (HOSPITAL_COMMUNITY)
Admission: EM | Admit: 2022-06-22 | Discharge: 2022-06-24 | Disposition: A | Discharge status: Home / Self Care | Payer: Medicare HMO | Attending: Internal Medicine | Admitting: Internal Medicine

## 2022-06-22 ENCOUNTER — Encounter (HOSPITAL_COMMUNITY): Payer: Self-pay

## 2022-06-22 ENCOUNTER — Emergency Department (HOSPITAL_COMMUNITY): Payer: Medicare HMO

## 2022-06-22 ENCOUNTER — Other Ambulatory Visit: Payer: Self-pay

## 2022-06-22 DIAGNOSIS — C3490 Malignant neoplasm of unspecified part of unspecified bronchus or lung: Secondary | ICD-10-CM | POA: Diagnosis present

## 2022-06-22 DIAGNOSIS — F17210 Nicotine dependence, cigarettes, uncomplicated: Secondary | ICD-10-CM | POA: Insufficient documentation

## 2022-06-22 DIAGNOSIS — Z85118 Personal history of other malignant neoplasm of bronchus and lung: Secondary | ICD-10-CM | POA: Diagnosis not present

## 2022-06-22 DIAGNOSIS — I129 Hypertensive chronic kidney disease with stage 1 through stage 4 chronic kidney disease, or unspecified chronic kidney disease: Secondary | ICD-10-CM | POA: Diagnosis not present

## 2022-06-22 DIAGNOSIS — N1831 Chronic kidney disease, stage 3a: Secondary | ICD-10-CM | POA: Diagnosis not present

## 2022-06-22 DIAGNOSIS — I10 Essential (primary) hypertension: Secondary | ICD-10-CM | POA: Diagnosis present

## 2022-06-22 DIAGNOSIS — R4182 Altered mental status, unspecified: Secondary | ICD-10-CM | POA: Diagnosis not present

## 2022-06-22 DIAGNOSIS — I480 Paroxysmal atrial fibrillation: Secondary | ICD-10-CM | POA: Diagnosis not present

## 2022-06-22 DIAGNOSIS — R55 Syncope and collapse: Secondary | ICD-10-CM | POA: Diagnosis not present

## 2022-06-22 DIAGNOSIS — R000 Tachycardia, unspecified: Secondary | ICD-10-CM | POA: Diagnosis not present

## 2022-06-22 DIAGNOSIS — Z8616 Personal history of COVID-19: Secondary | ICD-10-CM | POA: Insufficient documentation

## 2022-06-22 DIAGNOSIS — R69 Illness, unspecified: Secondary | ICD-10-CM | POA: Diagnosis not present

## 2022-06-22 DIAGNOSIS — I959 Hypotension, unspecified: Secondary | ICD-10-CM | POA: Diagnosis not present

## 2022-06-22 DIAGNOSIS — F418 Other specified anxiety disorders: Secondary | ICD-10-CM | POA: Diagnosis present

## 2022-06-22 DIAGNOSIS — Z79899 Other long term (current) drug therapy: Secondary | ICD-10-CM | POA: Diagnosis not present

## 2022-06-22 DIAGNOSIS — Z7982 Long term (current) use of aspirin: Secondary | ICD-10-CM | POA: Diagnosis not present

## 2022-06-22 DIAGNOSIS — Z902 Acquired absence of lung [part of]: Secondary | ICD-10-CM | POA: Diagnosis not present

## 2022-06-22 DIAGNOSIS — F32A Depression, unspecified: Secondary | ICD-10-CM | POA: Diagnosis present

## 2022-06-22 DIAGNOSIS — J9811 Atelectasis: Secondary | ICD-10-CM | POA: Diagnosis not present

## 2022-06-22 DIAGNOSIS — R42 Dizziness and giddiness: Secondary | ICD-10-CM | POA: Insufficient documentation

## 2022-06-22 DIAGNOSIS — W19XXXA Unspecified fall, initial encounter: Secondary | ICD-10-CM | POA: Diagnosis not present

## 2022-06-22 DIAGNOSIS — F17200 Nicotine dependence, unspecified, uncomplicated: Secondary | ICD-10-CM | POA: Diagnosis present

## 2022-06-22 DIAGNOSIS — Z743 Need for continuous supervision: Secondary | ICD-10-CM | POA: Diagnosis not present

## 2022-06-22 DIAGNOSIS — E785 Hyperlipidemia, unspecified: Secondary | ICD-10-CM | POA: Diagnosis present

## 2022-06-22 LAB — TROPONIN I (HIGH SENSITIVITY): Troponin I (High Sensitivity): 24 ng/L — ABNORMAL HIGH (ref <18)

## 2022-06-22 LAB — BASIC METABOLIC PANEL
Anion gap: 10 (ref 5–15)
BUN: 29 mg/dL — ABNORMAL HIGH (ref 8–23)
CO2: 25 mmol/L (ref 22–32)
Calcium: 8.9 mg/dL (ref 8.9–10.3)
Chloride: 105 mmol/L (ref 98–111)
Creatinine, Ser: 1.9 mg/dL — ABNORMAL HIGH (ref 0.61–1.24)
GFR, Estimated: 36 mL/min — ABNORMAL LOW (ref >60)
Glucose, Bld: 163 mg/dL — ABNORMAL HIGH (ref 70–99)
Potassium: 3.6 mmol/L (ref 3.5–5.1)
Sodium: 140 mmol/L (ref 135–145)

## 2022-06-22 LAB — HEPATIC FUNCTION PANEL
ALT: 22 U/L (ref 0–44)
AST: 34 U/L (ref 15–41)
Albumin: 3.5 g/dL (ref 3.5–5.0)
Alkaline Phosphatase: 43 U/L (ref 38–126)
Bilirubin, Direct: 0.2 mg/dL (ref 0.0–0.2)
Indirect Bilirubin: 0.8 mg/dL (ref 0.3–0.9)
Total Bilirubin: 1 mg/dL (ref 0.3–1.2)
Total Protein: 6.2 g/dL — ABNORMAL LOW (ref 6.5–8.1)

## 2022-06-22 LAB — CBC
HCT: 42.9 % (ref 39.0–52.0)
Hemoglobin: 14.5 g/dL (ref 13.0–17.0)
MCH: 31.8 pg (ref 26.0–34.0)
MCHC: 33.8 g/dL (ref 30.0–36.0)
MCV: 94.1 fL (ref 80.0–100.0)
Platelets: 279 10*3/uL (ref 150–400)
RBC: 4.56 MIL/uL (ref 4.22–5.81)
RDW: 12.9 % (ref 11.5–15.5)
WBC: 9.2 10*3/uL (ref 4.0–10.5)
nRBC: 0 % (ref 0.0–0.2)

## 2022-06-22 LAB — CBG MONITORING, ED: Glucose-Capillary: 144 mg/dL — ABNORMAL HIGH (ref 70–99)

## 2022-06-22 MED ORDER — SODIUM CHLORIDE 0.9 % IV BOLUS
500.0000 mL | Freq: Once | INTRAVENOUS | Status: AC
  Administered 2022-06-22: 500 mL via INTRAVENOUS

## 2022-06-22 MED ORDER — SODIUM CHLORIDE 0.9 % IV BOLUS
500.0000 mL | Freq: Once | INTRAVENOUS | Status: AC
  Administered 2022-06-23: 500 mL via INTRAVENOUS

## 2022-06-22 MED ORDER — ACETAMINOPHEN 325 MG PO TABS
650.0000 mg | ORAL_TABLET | Freq: Once | ORAL | Status: AC
  Administered 2022-06-23: 650 mg via ORAL
  Filled 2022-06-22: qty 2

## 2022-06-22 NOTE — ED Provider Notes (Signed)
Emergency Department Provider Note   I have reviewed the triage vital signs and the nursing notes.   HISTORY  Chief Complaint Loss of Consciousness (BIB EMS from home for syncopal episode, initially hypotensive, c/o pain in the head, had a lobectomu 3 weeks ago, afib hx, given 364ml with EMS and pressure systolic back up into the 120s, no complaint of chest pain, no complaint of SOB, not on blood thinners)   HPI CLEAVEN DEMARIO is a 76 y.o. male past history of A-fib (on ASA), HTN, and recent right upper lobectomy (3 weeks prior) for non-small cell lung cancer presents to the emergency department for evaluation of syncope.  Patient states he got up from the table and felt lightheaded.  He paused briefly in the began to walk across the room at which point he suddenly lost consciousness.  No chest pain, shortness of breath.  No diaphoresis or vomiting.  He states he next awoke finding his girlfriend trying to revive him.  EMS was called and reported low blood pressure in the field which improved with IV fluids and route. Patient notes he is currently feeling well.    Past Medical History:  Diagnosis Date   Acute metabolic encephalopathy 65/78/4696   Anxiety    Arthritis    Atrial fibrillation (HCC)    Cancer (HCC)    larynx ca, surgery and radiation   GERD (gastroesophageal reflux disease)    Heart murmur    Hernia, inguinal, bilateral    History of chicken pox    History of kidney stones    History of measles    History of mumps    History of shingles    HOH (hard of hearing)    Hypertension    Macular degeneration of both eyes    Motion sickness    ocean boat   Pneumonia due to COVID-19 virus 07/31/2020   PVC (premature ventricular contraction)     Review of Systems  Constitutional: No fever/chills Eyes: No visual changes. ENT: No sore throat. Cardiovascular: Denies chest pain. Positive syncope.  Respiratory: Denies shortness of breath. Gastrointestinal: No  abdominal pain.  Genitourinary: Negative for dysuria. Musculoskeletal: Negative for back pain. Skin: Negative for rash. Neurological: Mild posterior HA from fall. ____________________________________________   PHYSICAL EXAM:  VITAL SIGNS: ED Triage Vitals [06/22/22 2206]  Enc Vitals Group     BP      Pulse      Resp      Temp      Temp src      SpO2      Weight      Height      Head Circumference      Peak Flow      Pain Score 6     Pain Loc      Pain Edu?      Excl. in [place]?    Constitutional: Alert and oriented. Well appearing and in no acute distress. Eyes: Conjunctivae are normal.  Head: Faint abrasion to the occipital scalp. No laceration.  Nose: No congestion/rhinnorhea. Mouth/Throat: Mucous membranes are moist.  Neck: No stridor.  No cervical spine tenderness to palpation. Cardiovascular: Normal rate, regular rhythm. Good peripheral circulation. Grossly normal heart sounds.   Respiratory: Normal respiratory effort.  No retractions. Lungs CTAB. Gastrointestinal: Soft and nontender. No distention.  Musculoskeletal: No lower extremity tenderness nor edema. No gross deformities of extremities. Neurologic:  Normal speech and language. No gross focal neurologic deficits are appreciated.  Skin:  Skin  is warm, dry and intact. No rash noted.   ____________________________________________   LABS (all labs ordered are listed, but only abnormal results are displayed)  Labs Reviewed  BASIC METABOLIC PANEL  CBC  URINALYSIS, ROUTINE W REFLEX MICROSCOPIC  HEPATIC FUNCTION PANEL  CBG MONITORING, ED  TROPONIN I (HIGH SENSITIVITY)   ____________________________________________  EKG   EKG Interpretation  Date/Time:  Monday June 22 2022 22:23:05 EST Ventricular Rate:  70 PR Interval:  181 QRS Duration: 97 QT Interval:  403 QTC Calculation: 435 R Axis:   -20 Text Interpretation: Sinus rhythm Probable left atrial enlargement Borderline left axis deviation Low  voltage, precordial leads Baseline wander in lead(s) V1 Similar to Sep 2023 tracing Confirmed by Nanda Quinton [phone]) on 06/22/2022 10:52:11 PM        ____________________________________________  RADIOLOGY  No results found.  ____________________________________________   PROCEDURES  Procedure(s) performed:   Procedures   ____________________________________________   INITIAL IMPRESSION / ASSESSMENT AND PLAN / ED COURSE  Pertinent labs & imaging results that were available during my care of the patient were reviewed by me and considered in my medical decision making (see chart for details).   This patient is Presenting for Evaluation of syncope, which does require a range of treatment options, and is a complaint that involves a high risk of morbidity and mortality.  The Differential Diagnoses include vasovagal syncope, dehydration, arrhythmia, ACS, PE, etc.  Critical Interventions-    Medications  sodium chloride 0.9 % bolus 500 mL (has no administration in time range)    Reassessment after intervention:     I decided to review pertinent External Data, and in summary patient with lobectomy in early December.   Clinical Laboratory Tests Ordered, included ***  Radiologic Tests Ordered, included CXR. I independently interpreted the images and agree with radiology interpretation.   Cardiac Monitor Tracing which shows NSR. No ectopy.    Social Determinants of Health Risk no smoking history.   Consult complete with  Medical Decision Making: Summary:  Patient presents to the emergency department with syncope and lightheadedness upon standing.  No chest pain or shortness of breath.  Symptoms resolved prior to arrival and blood pressure, reportedly low with EMS, has improved to normal after gentle IV fluid bolus.  Plan for orthostatic vitals, labs, chest x-ray.  Considered PE especially in the setting of cancer history and recent surgery.  Plan to follow labs and  reassess.   Reevaluation with update and discussion with   ***Considered admission***  Patient's presentation is most consistent with {EM COPA:[id number]}   Disposition:   ____________________________________________  FINAL CLINICAL IMPRESSION(S) / ED DIAGNOSES  Final diagnoses:  None     NEW OUTPATIENT MEDICATIONS STARTED DURING THIS VISIT:  New Prescriptions   No medications on file    Note:  This document was prepared using Dragon voice recognition software and may include unintentional dictation errors.  Nanda Quinton, MD, [facility] Emergency Medicine

## 2022-06-23 ENCOUNTER — Observation Stay (HOSPITAL_COMMUNITY): Payer: Medicare HMO

## 2022-06-23 ENCOUNTER — Emergency Department (HOSPITAL_COMMUNITY): Payer: Medicare HMO

## 2022-06-23 DIAGNOSIS — R55 Syncope and collapse: Secondary | ICD-10-CM

## 2022-06-23 DIAGNOSIS — R4182 Altered mental status, unspecified: Secondary | ICD-10-CM | POA: Diagnosis not present

## 2022-06-23 DIAGNOSIS — N1831 Chronic kidney disease, stage 3a: Secondary | ICD-10-CM | POA: Diagnosis present

## 2022-06-23 DIAGNOSIS — R911 Solitary pulmonary nodule: Secondary | ICD-10-CM | POA: Diagnosis not present

## 2022-06-23 DIAGNOSIS — J90 Pleural effusion, not elsewhere classified: Secondary | ICD-10-CM | POA: Diagnosis not present

## 2022-06-23 DIAGNOSIS — I480 Paroxysmal atrial fibrillation: Secondary | ICD-10-CM | POA: Diagnosis present

## 2022-06-23 DIAGNOSIS — E785 Hyperlipidemia, unspecified: Secondary | ICD-10-CM | POA: Diagnosis present

## 2022-06-23 LAB — URINALYSIS, ROUTINE W REFLEX MICROSCOPIC
Bilirubin Urine: NEGATIVE
Glucose, UA: NEGATIVE mg/dL
Hgb urine dipstick: NEGATIVE
Ketones, ur: NEGATIVE mg/dL
Leukocytes,Ua: NEGATIVE
Nitrite: NEGATIVE
Protein, ur: NEGATIVE mg/dL
Specific Gravity, Urine: 1.02 (ref 1.005–1.030)
pH: 5 (ref 5.0–8.0)

## 2022-06-23 LAB — TROPONIN I (HIGH SENSITIVITY): Troponin I (High Sensitivity): 27 ng/L — ABNORMAL HIGH (ref <18)

## 2022-06-23 MED ORDER — ACETAMINOPHEN 325 MG PO TABS
650.0000 mg | ORAL_TABLET | Freq: Four times a day (QID) | ORAL | Status: DC | PRN
  Administered 2022-06-23: 650 mg via ORAL
  Filled 2022-06-23: qty 2

## 2022-06-23 MED ORDER — ASPIRIN 81 MG PO TBEC
81.0000 mg | DELAYED_RELEASE_TABLET | Freq: Every day | ORAL | Status: DC
  Administered 2022-06-23 – 2022-06-24 (×2): 81 mg via ORAL
  Filled 2022-06-23 (×2): qty 1

## 2022-06-23 MED ORDER — PRAVASTATIN SODIUM 10 MG PO TABS
20.0000 mg | ORAL_TABLET | Freq: Every evening | ORAL | Status: DC
  Administered 2022-06-23: 20 mg via ORAL
  Filled 2022-06-23: qty 2

## 2022-06-23 MED ORDER — SERTRALINE HCL 100 MG PO TABS
100.0000 mg | ORAL_TABLET | Freq: Every day | ORAL | Status: DC
  Administered 2022-06-23 – 2022-06-24 (×2): 100 mg via ORAL
  Filled 2022-06-23 (×2): qty 1

## 2022-06-23 MED ORDER — BUPROPION HCL ER (SR) 150 MG PO TB12
150.0000 mg | ORAL_TABLET | Freq: Every day | ORAL | Status: DC
  Administered 2022-06-23 – 2022-06-24 (×2): 150 mg via ORAL
  Filled 2022-06-23 (×2): qty 1

## 2022-06-23 MED ORDER — DILTIAZEM HCL ER COATED BEADS 120 MG PO CP24
120.0000 mg | ORAL_CAPSULE | Freq: Every day | ORAL | Status: DC
  Administered 2022-06-24: 120 mg via ORAL
  Filled 2022-06-23: qty 1

## 2022-06-23 MED ORDER — PANTOPRAZOLE SODIUM 40 MG PO TBEC
40.0000 mg | DELAYED_RELEASE_TABLET | Freq: Every day | ORAL | Status: DC
  Administered 2022-06-23 – 2022-06-24 (×2): 40 mg via ORAL
  Filled 2022-06-23 (×2): qty 1

## 2022-06-23 MED ORDER — ONDANSETRON HCL 4 MG PO TABS: 4.0000 mg | ORAL_TABLET | Freq: Four times a day (QID) | ORAL | Status: DC | PRN

## 2022-06-23 MED ORDER — FLUTICASONE PROPIONATE 50 MCG/ACT NA SUSP
2.0000 | NASAL | Status: DC
  Administered 2022-06-24: 2 via NASAL
  Filled 2022-06-23: qty 16

## 2022-06-23 MED ORDER — CYCLOSPORINE 0.05 % OP EMUL: 1.0000 [drp] | Freq: Every day | OPHTHALMIC | Status: DC | PRN

## 2022-06-23 MED ORDER — ACETAMINOPHEN 650 MG RE SUPP: 650.0000 mg | Freq: Four times a day (QID) | RECTAL | Status: DC | PRN

## 2022-06-23 MED ORDER — RAMELTEON 8 MG PO TABS
8.0000 mg | ORAL_TABLET | Freq: Every day | ORAL | Status: DC
  Administered 2022-06-23 – 2022-06-24 (×2): 8 mg via ORAL
  Filled 2022-06-23 (×3): qty 1

## 2022-06-23 MED ORDER — ENOXAPARIN SODIUM 40 MG/0.4ML IJ SOSY
40.0000 mg | PREFILLED_SYRINGE | INTRAMUSCULAR | Status: DC
  Administered 2022-06-23 – 2022-06-24 (×2): 40 mg via SUBCUTANEOUS
  Filled 2022-06-23 (×2): qty 0.4

## 2022-06-23 MED ORDER — SODIUM CHLORIDE 0.9% FLUSH
3.0000 mL | Freq: Two times a day (BID) | INTRAVENOUS | Status: DC
  Administered 2022-06-23 – 2022-06-24 (×2): 3 mL via INTRAVENOUS

## 2022-06-23 MED ORDER — LACTATED RINGERS IV SOLN: INTRAVENOUS | Status: DC

## 2022-06-23 MED ORDER — ASPIRIN 81 MG PO TABS: 81.0000 mg | ORAL_TABLET | Freq: Every day | ORAL | Status: DC

## 2022-06-23 MED ORDER — GABAPENTIN 300 MG PO CAPS
300.0000 mg | ORAL_CAPSULE | Freq: Two times a day (BID) | ORAL | Status: DC
  Administered 2022-06-23 – 2022-06-24 (×2): 300 mg via ORAL
  Filled 2022-06-23 (×2): qty 1

## 2022-06-23 MED ORDER — ONDANSETRON HCL 4 MG/2ML IJ SOLN: 4.0000 mg | Freq: Four times a day (QID) | INTRAMUSCULAR | Status: DC | PRN

## 2022-06-23 MED ORDER — NICOTINE 7 MG/24HR TD PT24
7.0000 mg | MEDICATED_PATCH | Freq: Every day | TRANSDERMAL | Status: DC
  Filled 2022-06-23 (×2): qty 1

## 2022-06-23 NOTE — ED Notes (Signed)
Pt ambulated to restroom with a steady gait

## 2022-06-23 NOTE — Progress Notes (Signed)
Patient arrived to [place] room 16 alert and oriented x4. Pain level 0/10. Patient ambulated from stretcher to bed with no issues. Bed in lowest position. Call light in reach. Family at bedside. Will continue to monitor pt.

## 2022-06-23 NOTE — H&P (Signed)
History and Physical    Patient: Jeffrey Torres:[id number] DOB: 04/21/1946 DOA: 06/22/2022 DOS: the patient was seen and examined on 06/23/2022 PCP: Birdie Sons, MD  Patient coming from: Home - lives alone; NOK: Significant other, Crist Infante, [phone]   Chief Complaint: Syncope  HPI: Jeffrey Torres is a 76 y.o. male with medical history significant of RUL lobectomy for lung cancer on 11/30 (pathology with poorly differentiated adeoncarcinoma with negative margins and LN), afib  not on AC, remote laryngeal CA s/p surgery and radiation, and HTN presenting with syncope.  He reports that he has been slowly but appropriately recovering fm his recent surgery.  He is eating and drinking well.  He reports that he stood up from his chair yesterday, intending to take the garbage out.  He felt dizzy upon standing, held onto the arms of the chair, and awoke to his girlfriend calling his name while he was unconscious in the floor.  He did previously have dizziness while taking trazodone but he stopped taking that.  He feels fine now.    ER Course:  Carryover, per Dr. Josephine Cables:  Patient with recent history of right upper lobectomy due to lung cancer and doing well postoperatively had a syncopal episode yesterday when he got up from a sitting position, as he was ambulating, he passed out.  Wife eventually noticed that patient was breathing, though pale and diaphoretic.  He was initially hypotensive per EMS.      Review of Systems: As mentioned in the history of present illness. All other systems reviewed and are negative. Past Medical History:  Diagnosis Date   Acute metabolic encephalopathy 56/21/3086   Anxiety    Arthritis    Atrial fibrillation ([place])    Cancer (HCC)    larynx ca, surgery and radiation   GERD (gastroesophageal reflux disease)    Heart murmur    Hernia, inguinal, bilateral    History of chicken pox    History of kidney stones    History of measles    History of  mumps    History of shingles    HOH (hard of hearing)    Hypertension    Macular degeneration of both eyes    Motion sickness    ocean boat   Pneumonia due to COVID-19 virus 07/31/2020   PVC (premature ventricular contraction)    Past Surgical History:  Procedure Laterality Date   CARDIAC CATHETERIZATION  2010   normal per patient report   CATARACT EXTRACTION W/PHACO Right 07/18/2019   Procedure: CATARACT EXTRACTION PHACO AND INTRAOCULAR LENS PLACEMENT ([place]) RIGHT 4.54 00:29.7;  Surgeon: Birder Robson, MD;  Location: [place];  Service: Ophthalmology;  Laterality: Right;   CATARACT EXTRACTION W/PHACO Left 08/08/2019   Procedure: CATARACT EXTRACTION PHACO AND INTRAOCULAR LENS PLACEMENT ([place]) LEFT;  Surgeon: Birder Robson, MD;  Location: [facility];  Service: Ophthalmology;  Laterality: Left;  Lot #[id number] H [place]: 00:34.5 CDE: 4.75   COLONOSCOPY  06/24/2006   Dr. Bary Castilla. Multiple benign appearing 79mm polyps in the cecum and in the rectum. -Three 33mm polyps in the transversed colon, in the tranverse colon, proximal and in the distal transverse colon. Resected and retrieved.   COLONOSCOPY WITH PROPOFOL N/A 03/12/2020   Procedure: COLONOSCOPY WITH PROPOFOL;  Surgeon: Lucilla Lame, MD;  Location: [facility];  Service: Endoscopy;  Laterality: N/A;   CYSTOSCOPY WITH HOLMIUM LASER LITHOTRIPSY  2001   kidney stones removed, [facility]   HERNIA REPAIR     umbilical and bilateral inguninal  INTERCOSTAL NERVE BLOCK  05/28/2022   Procedure: INTERCOSTAL NERVE BLOCK;  Surgeon: Melrose Nakayama, MD;  Location: [place];  Service: Thoracic;;   LARYNGOSCOPY Right 04/01/2017   Procedure: SUSPENSION LARYNGOSCOPY WITH MICROFLAP EXCISION;  Surgeon: Carloyn Manner, MD;  Location: [facility];  Service: ENT;  Laterality: Right;   LYMPH NODE DISSECTION  05/28/2022   Procedure: LYMPH NODE DISSECTION;  Surgeon: Melrose Nakayama, MD;  Location: [place];  Service: Thoracic;;   sinus surgery    2002   [place]; removal of polyps   TONSILLECTOMY  1954   Tubular adenoma removed  06/24/2006   Social History:  reports that he has been smoking cigarettes. He started smoking about 51 years ago. He has a 50.00 pack-year smoking history. He has never used smokeless tobacco. He reports that he does not currently use alcohol. He reports that he does not use drugs.  Allergies  Allergen Reactions   Penicillins Anaphylaxis and Swelling    Did it involve swelling of the face/tongue/throat, SOB, or low BP? Yes Did it involve sudden or severe rash/hives, skin peeling, or any reaction on the inside of your mouth or nose? No Did you need to seek medical attention at a hospital or doctor's office? No When did it last happen?      10 + years ago If all above answers are "NO", may proceed with cephalosporin use.    Atorvastatin Other (See Comments)    myalgia   Trazodone And Nefazodone     Caused fainting spells    Family History  Problem Relation Age of Onset   Lung cancer Mother    Heart disease Father     Prior to Admission medications   Medication Sig Start Date End Date Taking? Authorizing Provider  aspirin 81 MG tablet Take 81 mg by mouth daily.   Yes [provider]  buPROPion (WELLBUTRIN SR) 150 MG 12 hr tablet Take 1 tablet (150 mg total) by mouth 2 (two) times daily. Patient taking differently: Take 150 mg by mouth daily. 05/16/22  Yes Birdie Sons, MD  cycloSPORINE (RESTASIS) 0.05 % ophthalmic emulsion Place 1 drop into both eyes daily as needed (dry eyes).   Yes [provider]  diltiazem (CARDIZEM CD) 120 MG 24 hr capsule Take 1 capsule (120 mg total) by mouth daily. 10/08/21  Yes Agbor-Etang, Aaron Edelman, MD  fluticasone (FLONASE) 50 MCG/ACT nasal spray Place 2 sprays into both nostrils every morning.    Yes [provider]  gabapentin (NEURONTIN) 300 MG capsule Take 1 capsule (300 mg total) by mouth 2 (two) times daily. 05/31/22  Yes Roddenberry, Arlis Porta, PA-C  hydrochlorothiazide (HYDRODIURIL) 25 MG tablet Take 1 tablet (25 mg total) by mouth daily. 09/29/21  Yes Kate Sable, MD  KLOR-CON M20 20 MEQ tablet TAKE 1 TABLET BY MOUTH EVERY DAY 12/29/21  Yes Birdie Sons, MD  Multiple Vitamins-Minerals (PRESERVISION AREDS 2 PO) Take 2 capsules by mouth every morning.    Yes [provider]  omeprazole (PRILOSEC) 20 MG capsule Take 20 mg by mouth daily as needed (acid reflux).   Yes [provider]  pravastatin (PRAVACHOL) 20 MG tablet Take 1 tablet (20 mg total) by mouth every evening. 04/17/22 12/15/22 Yes Agbor-Etang, Aaron Edelman, MD  ramelteon (ROZEREM) 8 MG tablet Take 1 tablet (8 mg total) by mouth at bedtime. 04/22/22  Yes Birdie Sons, MD  sertraline (ZOLOFT) 100 MG tablet Take 1 tablet (100 mg total) by mouth daily. 01/06/22  Yes Birdie Sons, MD  sildenafil (VIAGRA) 50 MG tablet Take 1 tablet (50 mg total) by mouth daily as needed for erectile dysfunction. 04/22/22  Yes Birdie Sons, MD  tamsulosin (FLOMAX) 0.4 MG CAPS capsule Take 1 capsule (0.4 mg total) by mouth daily after supper. 07/11/20  Yes Ezekiel Slocumb, DO    Physical Exam: Vitals:   06/23/22 1045 06/23/22 1400 06/23/22 1500 06/23/22 1700  BP: (!) 141/63 (!) 135/56 (!) 147/59 (!) 141/76  Pulse: (!) 57 (!) 57 (!) 56 (!) 50  Resp: 20 17 19 15   Temp: 98 F (36.7 C)  98 F (36.7 C)   TempSrc: Oral  Oral   SpO2: 95% 96% 96% 94%  Weight:      Height:       General:  Appears calm and comfortable and is in NAD Eyes:  PERRL, EOMI, normal lids, iris ENT:  grossly normal hearing, lips & tongue, mmm; appropriate dentition Neck:  no LAD, masses or thyromegaly Cardiovascular:  RR with mild bradycardia, no m/r/g. No LE edema.  Respiratory:   CTA bilaterally with no wheezes/rales/rhonchi.  Normal respiratory effort. Abdomen:  soft, NT, ND Skin:  no rash or induration seen on limited exam Musculoskeletal:  grossly normal tone BUE/BLE, good ROM, no bony  abnormality Psychiatric:  grossly normal mood and affect, speech fluent and appropriate, AOx3 Neurologic:  CN 2-12 grossly intact, moves all extremities in coordinated fashion   Radiological Exams on Admission: Independently reviewed - see discussion in A/P where applicable  CT Chest Wo Contrast  Result Date: 06/23/2022 CLINICAL DATA:  Abnormal chest radiograph.  Lung nodule. EXAM: CT CHEST WITHOUT CONTRAST TECHNIQUE: Multidetector CT imaging of the chest was performed following the standard protocol without IV contrast. RADIATION DOSE REDUCTION: This exam was performed according to the departmental dose-optimization program which includes automated exposure control, adjustment of the mA and/or kV according to patient size and/or use of iterative reconstruction technique. COMPARISON:  Chest radiograph dated 06/22/2022. chest CT dated 04/24/2022. FINDINGS: Evaluation of this exam is limited in the absence of intravenous contrast. Cardiovascular: There is no cardiomegaly or pericardial effusion. There is coronary vascular calcification. Mild atherosclerotic calcification of the thoracic aorta. No aneurysmal dilatation. The central pulmonary arteries are grossly unremarkable on this noncontrast CT. Mediastinum/Nodes: No obvious hilar or mediastinal adenopathy. Evaluation however is limited in the absence of contrast and postsurgical changes of the right lung. The esophagus is grossly unremarkable. No mediastinal fluid collection. Lungs/Pleura: Postsurgical changes of right upper lobe with surgical sutures. There is overall decreased volume of the right lung with slight shift of the mediastinum. There is a small right pleural effusion. There is postsurgical changes and scarring adjacent to the right hilum. There is a 6 mm right lower lobe pulmonary nodule (89/4), present on the prior CT. The left lung is clear. There is no pneumothorax. The central airways remain patent. Upper Abdomen: No acute abnormality.  Musculoskeletal: Osteopenia.  No acute osseous pathology. IMPRESSION: 1. Postsurgical changes of right upper lobe. Small right pleural effusion. 2. A 6 mm right lower lobe pulmonary nodule, present on the prior CT. 3.  Aortic Atherosclerosis (ICD10-I70.0). Electronically Signed   By: Anner Crete M.D.   On: 06/23/2022 03:37   CT Head Wo Contrast  Result Date: 06/23/2022 CLINICAL DATA:  Altered mental status. EXAM: CT HEAD WITHOUT CONTRAST TECHNIQUE: Contiguous axial images were obtained from the base of the skull through the vertex without intravenous contrast. RADIATION DOSE REDUCTION: This exam was  performed according to the departmental dose-optimization program which includes automated exposure control, adjustment of the mA and/or kV according to patient size and/or use of iterative reconstruction technique. COMPARISON:  Head CT dated 12/04/2020. FINDINGS: Brain: Mild age-related atrophy and chronic microvascular ischemic changes. Subcentimeter hypodense focus in the right basal ganglia, likely an old lacunar infarct. There is no acute intracranial hemorrhage. No mass effect or midline shift. No extra-axial fluid collection. Vascular: No hyperdense vessel or unexpected calcification. Skull: Normal. Negative for fracture or focal lesion. Sinuses/Orbits: Mild mucoperiosteal thickening of paranasal sinuses and postsurgical changes of the right maxillary sinus. No air-fluid level. Other: None IMPRESSION: 1. No acute intracranial pathology. 2. Mild age-related atrophy and chronic microvascular ischemic changes. Electronically Signed   By: Anner Crete M.D.   On: 06/23/2022 02:22   DG Chest 2 View  Result Date: 06/22/2022 CLINICAL DATA:  Right upper lobe partial or complete lobectomy 05/28/2022, presenting with syncopal episode. EXAM: CHEST - 2 VIEW COMPARISON:  Portable chest 05/31/22. FINDINGS: 10:34 p.m. PA Lat views. Changes and volume loss of right partial pneumonectomy again noted. There is an  increasingly tented appearance of the right hemidiaphragm centered anteriorly, most likely postsurgical and there could be a small loculated subpulmonic effusion. There is increased perihilar opacity in the right upper lung field which could be atelectasis or a small pneumonia. Linear atelectasis is again noted in the lateral left base. The remaining lungs are clear. The left sulci are sharp. Stable mediastinum with patchy aortic calcification and mild tortuosity. The cardiac size is normal. Vascular markings are normal caliber. No measurable pneumothorax. There are multiple overlying monitor wires. No acute osseous findings. IMPRESSION: 1. Increasingly tented appearance of the right hemidiaphragm centered anteriorly, most likely postsurgical and there could be a small loculated right subpulmonic effusion. 2. Increased perihilar opacity in the right upper lung field which could be atelectasis or a small pneumonia. Follow-up as indicated. 3. Stable linear atelectasis in the lateral left base. 4. Aortic atherosclerosis. Electronically Signed   By: Telford Nab M.D.   On: 06/22/2022 22:57    EKG: Independently reviewed.   2223 - NSR with rate 70; no evidence of acute ischemia 2340 - NSR with rate 67; no evidence of acute ischemia   Labs on Admission: I have personally reviewed the available labs and imaging studies at the time of the admission.  Pertinent labs:    K+ 3.4 Glucose 183 BUN 18/Creatinine 1.37/GFR 54 - stable Albumin 3.0 WBC 11.8 Unremarkable UA HS troponin 24, 27   Assessment and Plan: Principal Problem:   Syncope and collapse Active Problems:   Primary hypertension   Tobacco dependence   Depression   Lung cancer (HCC)   PAF (paroxysmal atrial fibrillation) (HCC)   Dyslipidemia   Chronic kidney disease, stage 3a (HCC)    Syncope -Etiology is most likely orthostatic syncope, as he has had dizziness in the past and was dizzy upon standing and then had syncope -This  patient is at mild to moderate risk for serious outcome; he was offered hospital discharge but preferred overnight monitoring on telemetry in the hospital. -Orthostatic vital signs now and in AM -Recent echo with bubble study unremarkable -Recent nuclear stress test also reassuring -Neuro checks  -Hold tamsulosin -Continue ASA -Anticipate dc in AM  Lung cancer s/p recent lobectomy -Stage 1b adenocarcinoma of the lung s/p lobectomy recently with clean margins -Saw oncology on 12/14, does not need adjuvant XRT or chemo -He is due for a repeat CT in  3 months and then can be transitioned to q76m -Saw CT surgery on 12/14 and was planned for repeat CXR in 3 weeks  Afib -Not on AC -Rate controlled with diltiazem  HTN -Continue diltiazem -Hold HCTZ, tamsulosin  HLD -Continue pravastatin  Mood d/o -Continue sertraline, ramelteon -He was recently started on bupropion for smoking cessation - will continue for now  Stage 3a CKD -Appears to be stable at this time -Attempt to avoid nephrotoxic medications -Recheck BMP in AM   Tobacco dependence -Encourage cessation.   -He has been cutting down and is smoking far less than he used to, praise provided. -Patch ordered     Advance Care Planning:   Code Status: Full Code - Code status was discussed with the patient at the time of admission.  The patient would want to receive full resuscitative measures at this time.   Consults: None  DVT Prophylaxis: Lovenox  Family Communication: None present; he did not request that I contact family at the time of admission  Severity of Illness: The appropriate patient status for this patient is OBSERVATION. Observation status is judged to be reasonable and necessary in order to provide the required intensity of service to ensure the patient's safety. The patient's presenting symptoms, physical exam findings, and initial radiographic and laboratory data in the context of their medical condition is  felt to place them at decreased risk for further clinical deterioration. Furthermore, it is anticipated that the patient will be medically stable for discharge from the hospital within 2 midnights of admission.   Author: Karmen Bongo, MD 06/23/2022 5:09 PM  For on call review [email or web address].

## 2022-06-23 NOTE — ED Provider Notes (Signed)
Signed out to me by Dr. Laverta Baltimore.  Patient being evaluated after a syncopal episode.  Complicated recent history including right upper lobectomy secondary to lung cancer.  Patient and significant other reports that he has been doing well postoperatively.  He had a syncopal episode tonight.  Patient was ambulating in his home when he suddenly lost consciousness.  Significant other reports that she saw him on the ground and he "looked dead".  She was then able to ascertain that he was breathing, was pale and diaphoretic.  Patient was hypotensive initially for EMS, possible vasovagal episode. Physical Exam  BP 130/63   Pulse (!) 59   Temp 97.6 F (36.4 C) (Axillary)   Resp 18   Ht 5\' 11"  (1.803 m)   Wt 91.6 kg   SpO2 100%   BMI 28.17 kg/m   Physical Exam Vitals and nursing note reviewed.  Constitutional:      General: He is not in acute distress.    Appearance: He is well-developed.  HENT:     Head: Normocephalic and atraumatic.     Mouth/Throat:     Mouth: Mucous membranes are moist.  Eyes:     General: Vision grossly intact. Gaze aligned appropriately.     Extraocular Movements: Extraocular movements intact.     Conjunctiva/sclera: Conjunctivae normal.  Cardiovascular:     Rate and Rhythm: Normal rate and regular rhythm.     Pulses: Normal pulses.     Heart sounds: Normal heart sounds, S1 normal and S2 normal. No murmur heard.    No friction rub. No gallop.  Pulmonary:     Effort: Pulmonary effort is normal. No respiratory distress.     Breath sounds: Normal breath sounds.  Abdominal:     Palpations: Abdomen is soft.     Tenderness: There is no abdominal tenderness. There is no guarding or rebound.     Hernia: No hernia is present.  Musculoskeletal:        General: No swelling.     Cervical back: Full passive range of motion without pain, normal range of motion and neck supple. No pain with movement, spinous process tenderness or muscular tenderness. Normal range of motion.      Right lower leg: No edema.     Left lower leg: No edema.  Skin:    General: Skin is warm and dry.     Capillary Refill: Capillary refill takes less than 2 seconds.     Findings: No ecchymosis, erythema, lesion or wound.  Neurological:     Mental Status: He is alert and oriented to person, place, and time.     GCS: GCS eye subscore is 4. GCS verbal subscore is 5. GCS motor subscore is 6.     Cranial Nerves: Cranial nerves 2-12 are intact.     Sensory: Sensation is intact.     Motor: Motor function is intact. No weakness or abnormal muscle tone.     Coordination: Coordination is intact.  Psychiatric:        Mood and Affect: Mood normal.        Speech: Speech normal.        Behavior: Behavior normal.     Procedures  Procedures  ED Course / MDM    Medical Decision Making Amount and/or Complexity of Data Reviewed Labs: ordered. Radiology: ordered.  Risk OTC drugs. Decision regarding hospitalization.   Patient appears well upon reevaluation.  All of his data is now returned.  He did undergo CT head because  of the syncopal episode and hitting his head.  CT does not show any acute abnormality.  Review of his chest x-ray difficult to interpret.  These could be postsurgical changes, cannot rule out pneumonia.  Patient with acute kidney injury which could explain his syncopal episode.  Would not want to give IV contrast at this point.  Could be further hydrated and then receive IV contrast later today for further delineation.  Alternative would be to do a noncontrast CT now.  Patient with slightly elevated troponins.  No active chest pain.  No obvious heart failure.  He does not appear septic.  He is not tachycardic and does not feel short of breath currently.  He is 3 weeks postoperative, PE is in the differential diagnosis although felt to be less likely than multiple other diagnoses.  Cannot receive angiography at this time.  Patient likely had a vasovagal episode secondary to some  degree of dehydration.  Will continue IV hydration.  Will perform noncontrast CT so that cardiothoracic can review later and determine if anything needs to be done from their standpoint.  Hospitalist will opt the patient for further hydration and management.       Orpah Greek, MD 06/23/22 [phone]

## 2022-06-23 NOTE — ED Notes (Signed)
Pt in room A&O x3 c/o head pain from when he fell but reports better than before. No CP or SOB at this time. Updated on plan of care. Pt using urinal in room

## 2022-06-24 DIAGNOSIS — R55 Syncope and collapse: Secondary | ICD-10-CM | POA: Diagnosis not present

## 2022-06-24 LAB — CBC
HCT: 38.1 % — ABNORMAL LOW (ref 39.0–52.0)
Hemoglobin: 13.6 g/dL (ref 13.0–17.0)
MCH: 32.3 pg (ref 26.0–34.0)
MCHC: 35.7 g/dL (ref 30.0–36.0)
MCV: 90.5 fL (ref 80.0–100.0)
Platelets: 208 10*3/uL (ref 150–400)
RBC: 4.21 MIL/uL — ABNORMAL LOW (ref 4.22–5.81)
RDW: 12.8 % (ref 11.5–15.5)
WBC: 7.3 10*3/uL (ref 4.0–10.5)
nRBC: 0 % (ref 0.0–0.2)

## 2022-06-24 LAB — BASIC METABOLIC PANEL
Anion gap: 8 (ref 5–15)
BUN: 19 mg/dL (ref 8–23)
CO2: 26 mmol/L (ref 22–32)
Calcium: 8.6 mg/dL — ABNORMAL LOW (ref 8.9–10.3)
Chloride: 106 mmol/L (ref 98–111)
Creatinine, Ser: 1.25 mg/dL — ABNORMAL HIGH (ref 0.61–1.24)
GFR, Estimated: >60 mL/min (ref >60)
Glucose, Bld: 102 mg/dL — ABNORMAL HIGH (ref 70–99)
Potassium: 3.1 mmol/L — ABNORMAL LOW (ref 3.5–5.1)
Sodium: 140 mmol/L (ref 135–145)

## 2022-06-24 MED ORDER — POTASSIUM CHLORIDE CRYS ER 20 MEQ PO TBCR
40.0000 meq | EXTENDED_RELEASE_TABLET | Freq: Once | ORAL | Status: AC
  Administered 2022-06-24: 40 meq via ORAL
  Filled 2022-06-24: qty 2

## 2022-06-24 NOTE — Progress Notes (Signed)
Patient's family is upset and requesting to speak to a Doctor.  According to patient's son he has not seen any Doctor since the ED MD spoke with them. I informed patient and son that, there is a  note from Dr Lorin Mercy at 8:11 am but they said she was not present at the bedside. Patient's son later told me that he came to visit his dad after 09:00 and had probably missed Dr Lorin Mercy rounds. I tried to address their concerns but they just want to speak to a physician . On call provider notified but I was informed he was working remotely and will get someone to the bedside to address family concerns.

## 2022-06-24 NOTE — Progress Notes (Signed)
Discharge instructions given to pt. Pt verbalized understanding to all teaching and had no further questions. Patient currently in room waiting for son to pick him up.

## 2022-06-24 NOTE — Progress Notes (Signed)
Patient discharged to home with son with all belongings and paperwork

## 2022-06-24 NOTE — Evaluation (Signed)
Physical Therapy Evaluation Patient Details Name: Jeffrey Torres MRN: [id number] DOB: 18-Jun-1946 Today's Date: 06/24/2022  History of Present Illness  Jeffrey Torres is a 76 y.o. male with medical history significant of RUL lobectomy for lung cancer on 11/30,afib , remote laryngeal CA s/p surgery and radiation, and HTN presenting with syncope.  He reports that he has been slowly but appropriately recovering from his recent surgery.  He is eating and drinking well.  Pt states that he stood up from his chair intending to take the garbage out.  He felt dizzy upon standing, held onto the arms of the chair, and awoke to his girlfriend calling his name while he was unconscious in the floor.  He did previously have dizziness while taking trazodone but he stopped taking that.  PMH: Acute metabolic encephalopathy, anxiety, arthritis, A fib, cancer, GERD, heart murmur, hernia inguinal, HOH, HTN, macular degeneration bil, PVC  Clinical Impression  Pt is at baseline of functioning. He is independent with all activities. Pt is reporting no more dizziness/lightheadedness with position changes. No further recommended skilled physical therapy services at this time.        Recommendations for follow up therapy are one component of a multi-disciplinary discharge planning process, led by the attending physician.  Recommendations may be updated based on patient status, additional functional criteria and insurance authorization.  Follow Up Recommendations No PT follow up      Assistance Recommended at Discharge    Patient can return home with the following       Equipment Recommendations None recommended by PT  Recommendations for Other Services       Functional Status Assessment Patient has not had a recent decline in their functional status     Precautions / Restrictions Precautions Precautions: Fall Restrictions Weight Bearing Restrictions: No      Mobility  Bed Mobility Overal bed mobility:  Independent               Patient Response: Cooperative  Transfers Overall transfer level: Independent                      Ambulation/Gait Ambulation/Gait assistance: Independent   Assistive device: None Gait Pattern/deviations: WFL(Within Functional Limits)   Gait velocity interpretation: 1.31 - 2.62 ft/sec, indicative of limited community ambulator      Stairs Stairs:  (not applicable pt has no stairs)          Wheelchair Mobility    Modified Rankin (Stroke Patients Only)       Balance Overall balance assessment: Independent           Pertinent Vitals/Pain Pain Assessment Pain Assessment: No/denies pain    Home Living Family/patient expects to be discharged to:: Private residence Living Arrangements: Alone Available Help at Discharge: Family;Available 24 hours/day Type of Home: House Home Access: Level entry       Home Layout: One level Home Equipment: Conservation officer, nature (2 wheels)      Prior Function Prior Level of Function : Independent/Modified Independent             Mobility Comments: Driving, pt is retired. ADLs Comments: no difficulties with ADL's.     Hand Dominance   Dominant Hand: Right    Extremity/Trunk Assessment   Upper Extremity Assessment Upper Extremity Assessment: Overall WFL for tasks assessed    Lower Extremity Assessment Lower Extremity Assessment: Overall WFL for tasks assessed       Communication   Communication: No difficulties  Cognition Arousal/Alertness: Awake/alert Behavior During Therapy: WFL for tasks assessed/performed Overall Cognitive Status: Within Functional Limits for tasks assessed                   Assessment/Plan    PT Assessment Patient does not need any further PT services  PT Problem List         PT Treatment Interventions      PT Goals (Current goals can be found in the Care Plan section)  Acute Rehab PT Goals PT Goal Formulation: All assessment and  education complete, DC therapy     AM-PAC PT "6 Clicks" Mobility  Outcome Measure Help needed turning from your back to your side while in a flat bed without using bedrails?: None Help needed moving from lying on your back to sitting on the side of a flat bed without using bedrails?: None Help needed moving to and from a bed to a chair (including a wheelchair)?: None Help needed standing up from a chair using your arms (e.g., wheelchair or bedside chair)?: None Help needed to walk in hospital room?: None Help needed climbing 3-5 steps with a railing? : None 6 Click Score: 24    End of Session Equipment Utilized During Treatment: Gait belt Activity Tolerance: Patient tolerated treatment well Patient left: in bed;with bed alarm set;with call Jeffrey/phone within reach Nurse Communication: Mobility status      Time: 5916-3846 PT Time Calculation (min) (ACUTE ONLY): 10 min   Charges:   PT Evaluation $PT Eval Low Complexity: [place], DPT, [place] Office: [phone] (Secure chat preferred)   Ander Purpura 06/24/2022, 9:07 AM

## 2022-06-24 NOTE — Discharge Summary (Signed)
Physician Discharge Summary   Patient: Jeffrey Torres MRN: [id number] DOB: [birth year]  Admit date:     06/22/2022  Discharge date: 06/24/22  Discharge Physician: Elmarie Shiley   PCP: Birdie Sons, MD   Recommendations at discharge:    Needs follow up kidney function and BP.    Discharge Diagnoses: Principal Problem:   Syncope and collapse Active Problems:   Primary hypertension   Tobacco dependence   Depression   Lung cancer (HCC)   PAF (paroxysmal atrial fibrillation) (HCC)   Dyslipidemia   Chronic kidney disease, stage 3a ([place])  Resolved Problems:   * No resolved hospital problems. [facility] Course: 76 year old with past medical history significant for right upper lobe lobectomy for lung cancer 11/30 pathology with poorly differentiated adenocarcinoma with negative margins and lymph nodes, A-fib not on anticoagulation, remote laryngeal cancer status postsurgery and radiation, hypertension presents after a syncope episode.  The day prior to admission he had some dizziness.  On standing.  He was found on the floor unconscious by his girlfriend.  He presented to the ED for further evaluation.  Presented  with a creatinine of 1.9, prior creatinine 1.2. The patient likely related to dehydration.  Hydrochlorothiazide has been discontinued.   Assessment and Plan: 1-Syncope:  SPECT related to hypovolemia, dehydration.  Presented with a creatinine of 1.9 prior creatinine 1.2 Dizziness has resolved. He received IV fluids. Stable for discharge home today. Orthostatic  vitals negative  AKI on Stage 3a CKD: Prior creatinine range 1.1-1.2. Presents with creatinine of 1.9 Improved with IV fluids   Lung cancer s/p recent lobectomy -Stage 1b adenocarcinoma of the lung s/p lobectomy recently with clean margins -Saw oncology on 12/14, does not need adjuvant XRT or chemo -He is due for a repeat CT in 3 months and then can be transitioned to q49m -Saw CT surgery on 12/14  and was planned for repeat CXR in 3 weeks   Afib -Not on AC -Rate controlled with diltiazem   HTN -Continue diltiazem -Hold HCTZ due to AKI   HLD -Continue pravastatin   Mood d/o -Continue sertraline, ramelteon -He was recently started on bupropion for smoking cessation - will continue for now      Tobacco dependence -Encourage cessation.   -He has been cutting down and is smoking far less than he used to, praise provided. -Patch ordered           Consultants: none Procedures performed: none  Disposition: Home Diet recommendation:  Discharge Diet Orders (From admission, onward)     Start     Ordered   06/24/22 0000  Diet - low sodium heart healthy        06/24/22 0942           Cardiac diet DISCHARGE MEDICATION: Allergies as of 06/24/2022       Reactions   Penicillins Anaphylaxis, Swelling   Did it involve swelling of the face/tongue/throat, SOB, or low BP? Yes Did it involve sudden or severe rash/hives, skin peeling, or any reaction on the inside of your mouth or nose? No Did you need to seek medical attention at a hospital or doctor's office? No When did it last happen?      10 + years ago If all above answers are "NO", may proceed with cephalosporin use.   Atorvastatin Other (See Comments)   myalgia   Trazodone And Nefazodone    Caused fainting spells        Medication List  STOP taking these medications    hydrochlorothiazide 25 MG tablet Commonly known as: HYDRODIURIL   Klor-Con M20 20 MEQ tablet Generic drug: potassium chloride SA   sildenafil 50 MG tablet Commonly known as: Viagra       TAKE these medications    aspirin 81 MG tablet Take 81 mg by mouth daily.   buPROPion 150 MG 12 hr tablet Commonly known as: WELLBUTRIN SR Take 1 tablet (150 mg total) by mouth 2 (two) times daily. What changed: when to take this   cycloSPORINE 0.05 % ophthalmic emulsion Commonly known as: RESTASIS Place 1 drop into both eyes daily  as needed (dry eyes).   diltiazem 120 MG 24 hr capsule Commonly known as: CARDIZEM CD Take 1 capsule (120 mg total) by mouth daily.   fluticasone 50 MCG/ACT nasal spray Commonly known as: FLONASE Place 2 sprays into both nostrils every morning.   gabapentin 300 MG capsule Commonly known as: NEURONTIN Take 1 capsule (300 mg total) by mouth 2 (two) times daily.   omeprazole 20 MG capsule Commonly known as: PRILOSEC Take 20 mg by mouth daily as needed (acid reflux).   pravastatin 20 MG tablet Commonly known as: PRAVACHOL Take 1 tablet (20 mg total) by mouth every evening.   PRESERVISION AREDS 2 PO Take 2 capsules by mouth every morning.   ramelteon 8 MG tablet Commonly known as: Rozerem Take 1 tablet (8 mg total) by mouth at bedtime.   sertraline 100 MG tablet Commonly known as: ZOLOFT Take 1 tablet (100 mg total) by mouth daily.   tamsulosin 0.4 MG Caps capsule Commonly known as: FLOMAX Take 1 capsule (0.4 mg total) by mouth daily after supper.        Follow-up Information     Birdie Sons, MD Follow up in 1 week(s).   Specialty: Family Medicine Contact information: [address] [place] [address] [phone]                Discharge Exam: [name] Weights   06/22/22 2347 06/24/22 0422  Weight: 91.6 kg 92.7 kg   General; NAD  Condition at discharge: stable  The results of significant diagnostics from this hospitalization (including imaging, microbiology, ancillary and laboratory) are listed below for reference.   Imaging Studies: CT Chest Wo Contrast  Result Date: 06/23/2022 CLINICAL DATA:  Abnormal chest radiograph.  Lung nodule. EXAM: CT CHEST WITHOUT CONTRAST TECHNIQUE: Multidetector CT imaging of the chest was performed following the standard protocol without IV contrast. RADIATION DOSE REDUCTION: This exam was performed according to the departmental dose-optimization program which includes automated exposure control,  adjustment of the mA and/or kV according to patient size and/or use of iterative reconstruction technique. COMPARISON:  Chest radiograph dated 06/22/2022. chest CT dated 04/24/2022. FINDINGS: Evaluation of this exam is limited in the absence of intravenous contrast. Cardiovascular: There is no cardiomegaly or pericardial effusion. There is coronary vascular calcification. Mild atherosclerotic calcification of the thoracic aorta. No aneurysmal dilatation. The central pulmonary arteries are grossly unremarkable on this noncontrast CT. Mediastinum/Nodes: No obvious hilar or mediastinal adenopathy. Evaluation however is limited in the absence of contrast and postsurgical changes of the right lung. The esophagus is grossly unremarkable. No mediastinal fluid collection. Lungs/Pleura: Postsurgical changes of right upper lobe with surgical sutures. There is overall decreased volume of the right lung with slight shift of the mediastinum. There is a small right pleural effusion. There is postsurgical changes and scarring adjacent to the right hilum. There is a  6 mm right lower lobe pulmonary nodule (89/4), present on the prior CT. The left lung is clear. There is no pneumothorax. The central airways remain patent. Upper Abdomen: No acute abnormality. Musculoskeletal: Osteopenia.  No acute osseous pathology. IMPRESSION: 1. Postsurgical changes of right upper lobe. Small right pleural effusion. 2. A 6 mm right lower lobe pulmonary nodule, present on the prior CT. 3.  Aortic Atherosclerosis (ICD10-I70.0). Electronically Signed   By: Anner Crete M.D.   On: 06/23/2022 03:37   CT Head Wo Contrast  Result Date: 06/23/2022 CLINICAL DATA:  Altered mental status. EXAM: CT HEAD WITHOUT CONTRAST TECHNIQUE: Contiguous axial images were obtained from the base of the skull through the vertex without intravenous contrast. RADIATION DOSE REDUCTION: This exam was performed according to the departmental dose-optimization program which  includes automated exposure control, adjustment of the mA and/or kV according to patient size and/or use of iterative reconstruction technique. COMPARISON:  Head CT dated 12/04/2020. FINDINGS: Brain: Mild age-related atrophy and chronic microvascular ischemic changes. Subcentimeter hypodense focus in the right basal ganglia, likely an old lacunar infarct. There is no acute intracranial hemorrhage. No mass effect or midline shift. No extra-axial fluid collection. Vascular: No hyperdense vessel or unexpected calcification. Skull: Normal. Negative for fracture or focal lesion. Sinuses/Orbits: Mild mucoperiosteal thickening of paranasal sinuses and postsurgical changes of the right maxillary sinus. No air-fluid level. Other: None IMPRESSION: 1. No acute intracranial pathology. 2. Mild age-related atrophy and chronic microvascular ischemic changes. Electronically Signed   By: Anner Crete M.D.   On: 06/23/2022 02:22   DG Chest 2 View  Result Date: 06/22/2022 CLINICAL DATA:  Right upper lobe partial or complete lobectomy 05/28/2022, presenting with syncopal episode. EXAM: CHEST - 2 VIEW COMPARISON:  Portable chest 05/31/22. FINDINGS: 10:34 p.m. PA Lat views. Changes and volume loss of right partial pneumonectomy again noted. There is an increasingly tented appearance of the right hemidiaphragm centered anteriorly, most likely postsurgical and there could be a small loculated subpulmonic effusion. There is increased perihilar opacity in the right upper lung field which could be atelectasis or a small pneumonia. Linear atelectasis is again noted in the lateral left base. The remaining lungs are clear. The left sulci are sharp. Stable mediastinum with patchy aortic calcification and mild tortuosity. The cardiac size is normal. Vascular markings are normal caliber. No measurable pneumothorax. There are multiple overlying monitor wires. No acute osseous findings. IMPRESSION: 1. Increasingly tented appearance of the  right hemidiaphragm centered anteriorly, most likely postsurgical and there could be a small loculated right subpulmonic effusion. 2. Increased perihilar opacity in the right upper lung field which could be atelectasis or a small pneumonia. Follow-up as indicated. 3. Stable linear atelectasis in the lateral left base. 4. Aortic atherosclerosis. Electronically Signed   By: Telford Nab M.D.   On: 06/22/2022 22:57   DG CHEST PORT 1 VIEW  Result Date: 05/31/2022 CLINICAL DATA:  Status post lobectomy of the lung. EXAM: PORTABLE CHEST 1 VIEW COMPARISON:  May 30, 2022 FINDINGS: The heart size and mediastinal contours are stable. Stable changes of prior right upper lobectomy noted. Patchy consolidation of the medial right lung base slightly worse compared to prior exam. Interval developed patchy opacity left lung base probably due to atelectasis. Minimal right subdiaphragmatic gas is again identified slightly increased compared prior exam. The visualized skeletal structures are stable. IMPRESSION: 1. Patchy consolidation of the medial right lung base slightly worse compared to prior exam. 2. Interval developed patchy opacity left lung base probably  due to atelectasis. 3. Minimal right subdiaphragmatic gas is identified, slightly increased compared prior exam concerning for small volume pneumoperitoneum. Electronically Signed   By: Abelardo Diesel M.D.   On: 05/31/2022 09:12   DG CHEST PORT 1 VIEW  Result Date: 05/30/2022 CLINICAL DATA:  [id number] Status post lobectomy of lung [id number] EXAM: PORTABLE CHEST - 1 VIEW COMPARISON:  None Available. FINDINGS: Similar appearing postsurgical changes after right upper lobectomy. Interval removal of indwelling right thoracostomy tube. No evidence of pneumothorax. Slight interval decreased conspicuity of previously visualized subdiaphragmatic crescentic gas. Similar appearing bibasilar subsegmental atelectasis. No acute osseous abnormality. IMPRESSION: 1. Persistent but  decreased conspicuity of crescentic right sub diaphragmatic gas concern for small volume pneumoperitoneum. Again, recommend attention on follow-up. 2. No evidence of right pneumothorax after removal of right thoracostomy tube. 3. Similar appearing postsurgical changes after right upper lobectomy with persistent bibasilar subsegmental atelectasis. Electronically Signed   By: Ruthann Cancer M.D.   On: 05/30/2022 14:13   DG CHEST PORT 1 VIEW  Result Date: 05/30/2022 CLINICAL DATA:  [id number] Pneumothorax [id number] EXAM: PORTABLE CHEST - 1 VIEW COMPARISON:  05/29/2022, 05/28/2022, 05/26/2022 FINDINGS: Similar appearing postsurgical changes after right upper lobectomy. Right thoracostomy tube in unchanged position. Previously described apical pneumothorax is inconspicuous. Similar appearing suggestion of subdiaphragmatic crescentic gas, similar to most recent comparison. Bibasilar subsegmental atelectasis, slightly increased from comparison, likely due to poor inspiratory effort. No new focal consolidations. No acute osseous abnormality. IMPRESSION: 1. Concern for small pneumoperitoneum versus small volume sub pulmonic pneumothorax. Recommend attention on follow-up. CT abdomen pelvis could be considered in the appropriate clinical setting. 2. Resolution of previously visualized apical pneumothorax with unchanged position of indwelling thoracostomy tube. 3. Similar appearing postsurgical changes after right upper lobectomy with slight interval increased bibasilar subsegmental atelectasis. These results were called by telephone at the time of interpretation on 05/30/2022 at 7:59 am to provider Dr. Nils Pyle, Who verbally acknowledged these results. Ruthann Cancer, MD [facility] Electronically Signed   By: Ruthann Cancer M.D.   On: 05/30/2022 08:09   DG Chest Port 1 View  Result Date: 05/29/2022 CLINICAL DATA:  Follow-up pneumothorax EXAM: PORTABLE CHEST 1 VIEW  COMPARISON:  05/28/2022. FINDINGS: Right perihilar alveolar process. Right base consolidation. Vascular congestion. Enlarged cardiac silhouette. Calcified aorta. Tiny right apical pneumothorax has decreased in size. IMPRESSION: Base and perihilar alveolar densities and enlarged cardiac silhouette. Very small right apical pneumothorax has diminished compared to prior study. Electronically Signed   By: Sammie Bench M.D.   On: 05/29/2022 08:13   DG Chest Port 1 View  Result Date: 05/28/2022 CLINICAL DATA:  Status post right VATS for lung cancer EXAM: PORTABLE CHEST 1 VIEW COMPARISON:  05/26/2022 chest radiograph. FINDINGS: Low lung volumes. Top-normal heart size. Mediastinal contour within normal limits. Surgical sutures overlie the right hilum. Right apical chest tube in place. Small right apical pneumothorax, approximately 5-10%. No left pneumothorax. Streaky opacities throughout the mid to lower lungs bilaterally. Mild subcutaneous emphysema in the lower right chest wall. IMPRESSION: 1. Small right apical pneumothorax, approximately 5-10%. Right apical chest tube in place. 2. Low lung volumes. Streaky opacities throughout the mid to lower lungs bilaterally, favor atelectasis. These results will be called to the ordering clinician or representative by the Radiologist Assistant, and communication documented in the PACS or [employer]. Electronically Signed   By: Ilona Sorrel M.D.   On: 05/28/2022 12:44   DG Chest 2 View  Result Date: 05/27/2022 CLINICAL DATA:  Preop chest x-ray. EXAM: CHEST - 2 VIEW COMPARISON:  Chest x-ray April 30, 2022, chest CT April 24, 2022 FINDINGS: The heart size and mediastinal contours are within normal limits. 2 cm right upper lobe nodule is unchanged compared to prior CT chest. There is no pulmonary edema, focal pneumonia or pleural effusion. The visualized skeletal structures are stable. Chronic posttraumatic changes of several right ribs are stable. IMPRESSION: No  active cardiopulmonary disease. 2 cm right upper lobe nodule is unchanged compared to prior CT chest. Patient had recent bronchoscopy with biopsy. Management on clinical basis. Electronically Signed   By: Abelardo Diesel M.D.   On: 05/27/2022 10:58    Microbiology: Results for orders placed or performed during the hospital encounter of 05/28/22  SARS CORONAVIRUS 2 (TAT 6-24 HRS)     Status: None   Collection Time: 05/26/22  2:47 PM  Result Value Ref Range Status   SARS Coronavirus 2 NEGATIVE NEGATIVE Final    Comment: (NOTE) SARS-CoV-2 target nucleic acids are NOT DETECTED.  The SARS-CoV-2 RNA is generally detectable in upper and lower respiratory specimens during the acute phase of infection. Negative results do not preclude SARS-CoV-2 infection, do not rule out co-infections with other pathogens, and should not be used as the sole basis for treatment or other patient management decisions. Negative results must be combined with clinical observations, patient history, and epidemiological information. The expected result is Negative.  Fact Sheet for Patients: SugarRoll.be  Fact Sheet for Healthcare Providers: https://www.woods-mathews.com/  This test is not yet approved or cleared by the [place] FDA and  has been authorized for detection and/or diagnosis of SARS-CoV-2 by FDA under an Emergency Use Authorization (EUA). This EUA will remain  in effect (meaning this test can be used) for the duration of the COVID-19 declaration under Se ction 564(b)(1) of the Act, 21 U.S.C. section 360bbb-3(b)(1), unless the authorization is terminated or revoked sooner.  Performed at [facility], [place] [address], [place], [place] [address]     Labs: CBC: Recent Labs  Lab 06/22/22 2228 06/24/22 0308  WBC 9.2 7.3  HGB 14.5 13.6  HCT 42.9 38.1*  MCV 94.1 90.5  PLT 279 528   Basic Metabolic Panel: Recent Labs  Lab 06/22/22 2228  06/24/22 0308  NA 140 140  K 3.6 3.1*  CL 105 106  CO2 25 26  GLUCOSE 163* 102*  BUN 29* 19  CREATININE 1.90* 1.25*  CALCIUM 8.9 8.6*   Liver Function Tests: Recent Labs  Lab 06/22/22 2228  AST 34  ALT 22  ALKPHOS 43  BILITOT 1.0  PROT 6.2*  ALBUMIN 3.5   CBG: Recent Labs  Lab 06/22/22 2346  GLUCAP 144*    Discharge time spent: greater than 30 minutes.  Signed: Elmarie Shiley, MD [facility] 06/24/2022

## 2022-06-24 NOTE — Care Management (Signed)
  Transition of Care [facility]) Screening Note   Patient Details  Name: Jeffrey Torres Date of Birth: 13-May-1946   Transition of Care [facility]) CM/SW Contact:    Carles Collet, RN Phone Number: 06/24/2022, 8:47 AM    Transition of Care Department [facility]) has reviewed patient and no TOC needs have been identified at this time. We will continue to monitor patient advancement through interdisciplinary progression rounds. If new patient transition needs arise, please place a TOC consult.

## 2022-06-24 NOTE — Care Management Obs Status (Signed)
[place] NOTIFICATION   Patient Details  Name: Jeffrey Torres MRN: [id number] Date of Birth: May 01, 1946   Medicare Observation Status Notification Given:  Yes    Carles Collet, RN 06/24/2022, 11:35 AM

## 2022-06-26 NOTE — Progress Notes (Deleted)
      Established patient visit   Patient: Jeffrey Torres   DOB: May 24, 1946   76 y.o. Male  MRN: [id number] Visit Date: 07/01/2022  Today's healthcare provider: Debera Lat, PA-C   No chief complaint on file.  Subjective    HPI  ***  Medications: Outpatient Medications Prior to Visit  Medication Sig   aspirin 81 MG tablet Take 81 mg by mouth daily.   buPROPion (WELLBUTRIN SR) 150 MG 12 hr tablet Take 1 tablet (150 mg total) by mouth 2 (two) times daily. (Patient taking differently: Take 150 mg by mouth daily.)   cycloSPORINE (RESTASIS) 0.05 % ophthalmic emulsion Place 1 drop into both eyes daily as needed (dry eyes).   diltiazem (CARDIZEM CD) 120 MG 24 hr capsule Take 1 capsule (120 mg total) by mouth daily.   fluticasone (FLONASE) 50 MCG/ACT nasal spray Place 2 sprays into both nostrils every morning.    gabapentin (NEURONTIN) 300 MG capsule Take 1 capsule (300 mg total) by mouth 2 (two) times daily.   Multiple Vitamins-Minerals (PRESERVISION AREDS 2 PO) Take 2 capsules by mouth every morning.    omeprazole (PRILOSEC) 20 MG capsule Take 20 mg by mouth daily as needed (acid reflux).   pravastatin (PRAVACHOL) 20 MG tablet Take 1 tablet (20 mg total) by mouth every evening.   ramelteon (ROZEREM) 8 MG tablet Take 1 tablet (8 mg total) by mouth at bedtime.   sertraline (ZOLOFT) 100 MG tablet Take 1 tablet (100 mg total) by mouth daily.   tamsulosin (FLOMAX) 0.4 MG CAPS capsule Take 1 capsule (0.4 mg total) by mouth daily after supper.   No facility-administered medications prior to visit.    Review of Systems  {Labs  Heme  Chem  Endocrine  Serology  Results Review (optional):23779}   Objective    There were no vitals taken for this visit. {Show previous vital signs (optional):23777}  Physical Exam  ***  No results found for any visits on 07/01/22.  Assessment & Plan     ***

## 2022-07-01 ENCOUNTER — Inpatient Hospital Stay: Payer: Medicare HMO | Admitting: Physician Assistant

## 2022-07-02 ENCOUNTER — Encounter: Payer: Self-pay | Admitting: Thoracic Surgery (Cardiothoracic Vascular Surgery)

## 2022-07-02 ENCOUNTER — Ambulatory Visit (INDEPENDENT_AMBULATORY_CARE_PROVIDER_SITE_OTHER): Payer: Self-pay | Admitting: Thoracic Surgery (Cardiothoracic Vascular Surgery)

## 2022-07-02 ENCOUNTER — Ambulatory Visit (HOSPITAL_COMMUNITY)
Admission: RE | Admit: 2022-07-02 | Discharge: 2022-07-02 | Disposition: A | Discharge status: Home / Self Care | Payer: Medicare HMO | Source: Ambulatory Visit | Attending: Cardiothoracic Surgery | Admitting: Cardiothoracic Surgery

## 2022-07-02 VITALS — BP 153/72 | HR 61 | Resp 20 | Wt 202.0 lb

## 2022-07-02 DIAGNOSIS — C3490 Malignant neoplasm of unspecified part of unspecified bronchus or lung: Secondary | ICD-10-CM | POA: Insufficient documentation

## 2022-07-02 DIAGNOSIS — R911 Solitary pulmonary nodule: Secondary | ICD-10-CM

## 2022-07-02 DIAGNOSIS — Z85118 Personal history of other malignant neoplasm of bronchus and lung: Secondary | ICD-10-CM | POA: Diagnosis not present

## 2022-07-02 DIAGNOSIS — Z9889 Other specified postprocedural states: Secondary | ICD-10-CM | POA: Diagnosis not present

## 2022-07-02 NOTE — Progress Notes (Signed)
[address]       [place] [address]             [phone]     HPI: [name] returns for a scheduled follow-up visit  Jeffrey Torres is a 77 year old man with a history of tobacco abuse, laryngeal cancer treated with radiation, atrial fibrillation, arthritis, anxiety, reflux, macular degeneration, and a stage Ib squamous cell carcinoma of the lung.  He was hospitalized in September with dizziness and found to have a right upper lobe lung nodule.  On PET/CT the nodule was hypermetabolic.  Dr. Patsey Berthold did robotic bronchoscopy which showed carcinoma.  He underwent a robotic assisted right upper lobectomy on 05/28/2022.  Final pathology was a T2a, N0, stage Ib squamous cell carcinoma.  His postoperative course was uncomplicated and he went home on day 3.  I did a telephone visit with him on 06/11/2022.  He was feeling well at that time with the exception of decreased respiratory capacity.  On Christmas day he had a syncopal episode and was readmitted to the hospital.  He turned out to be dehydrated.  Lasix and potassium were discontinued.  He has not had any more dizziness or syncopal episodes since he was discharged.  Past Medical History:  Diagnosis Date   Acute metabolic encephalopathy 27/51/7001   Anxiety    Arthritis    Atrial fibrillation (HCC)    Cancer (HCC)    larynx ca, surgery and radiation   GERD (gastroesophageal reflux disease)    Heart murmur    Hernia, inguinal, bilateral    History of chicken pox    History of kidney stones    History of measles    History of mumps    History of shingles    HOH (hard of hearing)    Hypertension    Macular degeneration of both eyes    Motion sickness    ocean boat   Pneumonia due to COVID-19 virus 07/31/2020   PVC (premature ventricular contraction)     Current Outpatient Medications  Medication Sig Dispense Refill   aspirin 81 MG tablet Take 81 mg by mouth daily.     buPROPion (WELLBUTRIN SR) 150  MG 12 hr tablet Take 1 tablet (150 mg total) by mouth 2 (two) times daily. (Patient taking differently: Take 150 mg by mouth daily.) 180 tablet 2   cycloSPORINE (RESTASIS) 0.05 % ophthalmic emulsion Place 1 drop into both eyes daily as needed (dry eyes).     diltiazem (CARDIZEM CD) 120 MG 24 hr capsule Take 1 capsule (120 mg total) by mouth daily. 90 capsule 3   fluticasone (FLONASE) 50 MCG/ACT nasal spray Place 2 sprays into both nostrils every morning.      gabapentin (NEURONTIN) 300 MG capsule Take 1 capsule (300 mg total) by mouth 2 (two) times daily. 30 capsule 1   Multiple Vitamins-Minerals (PRESERVISION AREDS 2 PO) Take 2 capsules by mouth every morning.      omeprazole (PRILOSEC) 20 MG capsule Take 20 mg by mouth daily as needed (acid reflux).     pravastatin (PRAVACHOL) 20 MG tablet Take 1 tablet (20 mg total) by mouth every evening. 30 tablet 3   ramelteon (ROZEREM) 8 MG tablet Take 1 tablet (8 mg total) by mouth at bedtime. 30 tablet 2   sertraline (ZOLOFT) 100 MG tablet Take 1 tablet (100 mg total) by mouth daily. 90 tablet 1   tamsulosin (FLOMAX) 0.4 MG CAPS capsule Take 1 capsule (0.4  mg total) by mouth daily after supper. 30 capsule 1   No current facility-administered medications for this visit.    Physical Exam BP (!) 153/72 (BP Location: Left Arm, Patient Position: Sitting, Cuff Size: Normal)   Pulse 61   Resp 20   Wt 202 lb (91.6 kg)   SpO2 95% Comment: RA  BMI 28.31 kg/m  77 year old man in no acute distress Alert and oriented x 3 with no focal deficits Lungs slightly diminished right base but otherwise clear Cardiac regular Incisions clean dry and intact with some mild erythema around chest tube site  Diagnostic Tests: CHEST - 2 VIEW   COMPARISON:  June 22, 2022.   FINDINGS: Stable cardiomediastinal silhouette. Left lung is clear. Stable postsurgical changes are seen involving the right upper and lower lobes. Bony thorax is unremarkable. No significant  change compared to prior exam.   IMPRESSION: Stable probable postsurgical changes seen involving right upper and lower lobes. No significant change compared to prior exam.     Electronically Signed   By: Marijo Conception M.D.   On: 07/02/2022 15:09  Impression: Jeffrey Torres is a 77 year old man with a history of tobacco abuse, laryngeal cancer treated with radiation, atrial fibrillation, arthritis, anxiety, reflux, macular degeneration, and a stage Ib squamous cell carcinoma of the lung.  Stage Ib squamous cell carcinoma of the lung (T2a, N0)-status post right upper lobectomy.  He has seen Dr. [name].  No indication for adjuvant therapy.  Scheduled to have a follow-up scan in the spring.  From surgical standpoint he is doing well.  He is not taking any narcotics.  There is a little local irritation around the chest tube site where the suture was still in place.  I removed the suture and that should clear on its own.  He may begin driving.  There are no restrictions on his activities but he was cautioned to build into new activities gradually.  Plan: Follow-up with Dr. Patsey Berthold and Dr. [name] I will be happy to see Jeffrey Torres back again anytime in the future if I can be of further assistance with his care  Melrose Nakayama, MD [facility] [phone]

## 2022-07-03 ENCOUNTER — Encounter: Payer: Self-pay | Admitting: Family Medicine

## 2022-07-03 ENCOUNTER — Ambulatory Visit (INDEPENDENT_AMBULATORY_CARE_PROVIDER_SITE_OTHER): Payer: Medicare HMO | Admitting: Family Medicine

## 2022-07-03 ENCOUNTER — Other Ambulatory Visit: Payer: Self-pay | Admitting: Family Medicine

## 2022-07-03 VITALS — BP 166/63 | HR 54 | Temp 97.8°F | Resp 16 | Ht 71.0 in | Wt 208.1 lb

## 2022-07-03 DIAGNOSIS — R7303 Prediabetes: Secondary | ICD-10-CM | POA: Diagnosis not present

## 2022-07-03 DIAGNOSIS — I480 Paroxysmal atrial fibrillation: Secondary | ICD-10-CM

## 2022-07-03 DIAGNOSIS — I10 Essential (primary) hypertension: Secondary | ICD-10-CM

## 2022-07-03 DIAGNOSIS — R7989 Other specified abnormal findings of blood chemistry: Secondary | ICD-10-CM | POA: Diagnosis not present

## 2022-07-03 DIAGNOSIS — R55 Syncope and collapse: Secondary | ICD-10-CM | POA: Diagnosis not present

## 2022-07-03 DIAGNOSIS — D649 Anemia, unspecified: Secondary | ICD-10-CM | POA: Insufficient documentation

## 2022-07-03 DIAGNOSIS — N1831 Chronic kidney disease, stage 3a: Secondary | ICD-10-CM

## 2022-07-03 DIAGNOSIS — E876 Hypokalemia: Secondary | ICD-10-CM

## 2022-07-03 NOTE — Assessment & Plan Note (Signed)
Chronic, stable with use of diet/exercise Repeat A1c Continue to recommend balanced, lower carb meals. Smaller meal size, adding snacks. Choosing water as drink of choice and increasing purposeful exercise.

## 2022-07-03 NOTE — Assessment & Plan Note (Signed)
06/22/22- was seen at ER with admission given AKI on CKD and dehydration Labs had stabilized prior to d/c

## 2022-07-03 NOTE — Assessment & Plan Note (Signed)
Acute, in setting of fluid shifts Repeat CMP today given d/c of oral supplemental K

## 2022-07-03 NOTE — Assessment & Plan Note (Signed)
Chronic, RC today with use of Dilt 120 Not on AC Continue to monitor BP given elevation in weight and BP s/p d/c of lasix with concern for AKI on CKD with dehydration Will repeat CMP today Followed by cardiology

## 2022-07-03 NOTE — Assessment & Plan Note (Signed)
Chronic, elevated Notes acute fluid shifts since discontinuation of lasix CTM Goal <140/<90

## 2022-07-03 NOTE — Assessment & Plan Note (Signed)
Acute on chronic, confirm CBC given change recent syncope and collapse

## 2022-07-03 NOTE — Assessment & Plan Note (Signed)
Acute on chronic, continue to prioritize water to assist Pt notes "that's all I'm drinking"

## 2022-07-03 NOTE — Assessment & Plan Note (Signed)
Chronic, previously elevated with AKI Repeat CMP today Now off lasix

## 2022-07-03 NOTE — Progress Notes (Signed)
I,Joseline E Rosas,acting as a scribe for Gwyneth Sprout, FNP.,have documented all relevant documentation on the behalf of Gwyneth Sprout, FNP,as directed by  Gwyneth Sprout, FNP while in the presence of Gwyneth Sprout, FNP.   Established patient visit  Patient: Jeffrey Torres   DOB: [birth year]   77 y.o. Male  MRN: [id number] Visit Date: 07/03/2022  Today's healthcare provider: Gwyneth Sprout, FNP  Introduced to nurse practitioner role and practice setting.  All questions answered.  Discussed provider/patient relationship and expectations.  Chief Complaint  Patient presents with   Hospitalization Follow-up   Subjective    HPI  Follow up Hospitalization  Patient was admitted to [facility] on 06/22/2022 and discharged on 06/24/2022. He was treated for syncope and collapse. Telephone follow up was done on N/A He reports excellent compliance with treatment. He reports this condition is improved.  ----------------------------------------------------------------------------------------- -   Medications: Outpatient Medications Prior to Visit  Medication Sig   aspirin 81 MG tablet Take 81 mg by mouth daily.   buPROPion (WELLBUTRIN SR) 150 MG 12 hr tablet Take 1 tablet (150 mg total) by mouth 2 (two) times daily. (Patient taking differently: Take 150 mg by mouth daily.)   cycloSPORINE (RESTASIS) 0.05 % ophthalmic emulsion Place 1 drop into both eyes daily as needed (dry eyes).   diltiazem (CARDIZEM CD) 120 MG 24 hr capsule Take 1 capsule (120 mg total) by mouth daily.   fluticasone (FLONASE) 50 MCG/ACT nasal spray Place 2 sprays into both nostrils every morning.    gabapentin (NEURONTIN) 300 MG capsule Take 1 capsule (300 mg total) by mouth 2 (two) times daily.   Multiple Vitamins-Minerals (PRESERVISION AREDS 2 PO) Take 2 capsules by mouth every morning.    omeprazole (PRILOSEC) 20 MG capsule Take 20 mg by mouth daily as needed (acid reflux).   pravastatin (PRAVACHOL) 20 MG tablet Take 1 tablet  (20 mg total) by mouth every evening.   ramelteon (ROZEREM) 8 MG tablet Take 1 tablet (8 mg total) by mouth at bedtime.   sertraline (ZOLOFT) 100 MG tablet TAKE 1 TABLET BY MOUTH EVERY DAY   tamsulosin (FLOMAX) 0.4 MG CAPS capsule Take 1 capsule (0.4 mg total) by mouth daily after supper.   No facility-administered medications prior to visit.    Review of Systems    Objective    BP (!) 166/63 (BP Location: Right Arm, Patient Position: Sitting, Cuff Size: Normal)   Pulse (!) 54   Temp 97.8 F (36.6 C) (Oral)   Resp 16   Ht 5\' 11"  (1.803 m)   Wt 208 lb 1.6 oz (94.4 kg)   SpO2 100%   BMI 29.02 kg/m   Physical Exam   No results found for any visits on 07/03/22.  Assessment & Plan     Problem List Items Addressed This Visit       Cardiovascular and Mediastinum   PAF (paroxysmal atrial fibrillation) (HCC) (Chronic)    Chronic, RC today with use of Dilt 120 Not on AC Continue to monitor BP given elevation in weight and BP s/p d/c of lasix with concern for AKI on CKD with dehydration Will repeat CMP today Followed by cardiology       Primary hypertension    Chronic, elevated Notes acute fluid shifts since discontinuation of lasix CTM Goal <140/<90        Genitourinary   Chronic kidney disease, stage 3a (HCC) (Chronic)    Chronic, previously elevated with AKI Repeat CMP  today Now off lasix         Other   Anemia    Acute on chronic, confirm CBC given change recent syncope and collapse       Relevant Orders   CBC   Elevated serum creatinine    Acute on chronic, continue to prioritize water to assist Pt notes "that's all I'm drinking"      Relevant Orders   Comprehensive Metabolic Panel (CMET)   Hypokalemia    Acute, in setting of fluid shifts Repeat CMP today given d/c of oral supplemental K      Relevant Orders   Comprehensive Metabolic Panel (CMET)   Prediabetes    Chronic, stable with use of diet/exercise Repeat A1c Continue to recommend  balanced, lower carb meals. Smaller meal size, adding snacks. Choosing water as drink of choice and increasing purposeful exercise.       Relevant Orders   Hemoglobin A1c   Syncope and collapse - Primary    06/22/22- was seen at ER with admission given AKI on CKD and dehydration Labs had stabilized prior to d/c        Relevant Orders   Comprehensive Metabolic Panel (CMET)   CBC   Return in about 4 months (around 11/01/2022) for annual examination.     Vonna Kotyk, FNP, have reviewed all documentation for this visit. The documentation on 07/03/22 for the exam, diagnosis, procedures, and orders are all accurate and complete.  Gwyneth Sprout, [place] [phone] (phone) [phone] (fax)  [place]

## 2022-07-04 LAB — CBC
Hematocrit: 43.1 % (ref 37.5–51.0)
Hemoglobin: 14.4 g/dL (ref 13.0–17.7)
MCH: 30.6 pg (ref 26.6–33.0)
MCHC: 33.4 g/dL (ref 31.5–35.7)
MCV: 92 fL (ref 79–97)
Platelets: 272 10*3/uL (ref 150–450)
RBC: 4.7 x10E6/uL (ref 4.14–5.80)
RDW: 12.4 % (ref 11.6–15.4)
WBC: 7.9 10*3/uL (ref 3.4–10.8)

## 2022-07-04 LAB — COMPREHENSIVE METABOLIC PANEL
ALT: 18 IU/L (ref 0–44)
AST: 23 IU/L (ref 0–40)
Albumin/Globulin Ratio: 2 (ref 1.2–2.2)
Albumin: 4.4 g/dL (ref 3.8–4.8)
Alkaline Phosphatase: 62 IU/L (ref 44–121)
BUN/Creatinine Ratio: 14 (ref 10–24)
BUN: 17 mg/dL (ref 8–27)
Bilirubin Total: <0.2 mg/dL (ref 0.0–1.2)
CO2: 24 mmol/L (ref 20–29)
Calcium: 8.7 mg/dL (ref 8.6–10.2)
Chloride: 102 mmol/L (ref 96–106)
Creatinine, Ser: 1.25 mg/dL (ref 0.76–1.27)
Globulin, Total: 2.2 g/dL (ref 1.5–4.5)
Glucose: 173 mg/dL — ABNORMAL HIGH (ref 70–99)
Potassium: 3.8 mmol/L (ref 3.5–5.2)
Sodium: 141 mmol/L (ref 134–144)
Total Protein: 6.6 g/dL (ref 6.0–8.5)
eGFR: 60 mL/min/{1.73_m2} (ref >59)

## 2022-07-04 LAB — HEMOGLOBIN A1C
Est. average glucose Bld gHb Est-mCnc: 120 mg/dL
Hgb A1c MFr Bld: 5.8 % — ABNORMAL HIGH (ref 4.8–5.6)

## 2022-07-06 NOTE — Progress Notes (Signed)
Electrolytes and cell counts have stabilized. Stable A1c. Continue to recommend balanced, lower carb meals. Smaller meal size, adding snacks. Choosing water as drink of choice and increasing purposeful exercise.

## 2022-07-14 ENCOUNTER — Other Ambulatory Visit: Payer: Self-pay | Admitting: Family Medicine

## 2022-07-15 ENCOUNTER — Ambulatory Visit: Payer: Medicare HMO | Admitting: Pulmonary Disease

## 2022-07-15 ENCOUNTER — Encounter: Payer: Self-pay | Admitting: Pulmonary Disease

## 2022-07-15 VITALS — BP 174/82 | HR 50 | Temp 97.6°F | Ht 71.0 in | Wt 209.4 lb

## 2022-07-15 DIAGNOSIS — C3491 Malignant neoplasm of unspecified part of right bronchus or lung: Secondary | ICD-10-CM | POA: Diagnosis not present

## 2022-07-15 DIAGNOSIS — R062 Wheezing: Secondary | ICD-10-CM | POA: Diagnosis not present

## 2022-07-15 DIAGNOSIS — J449 Chronic obstructive pulmonary disease, unspecified: Secondary | ICD-10-CM | POA: Diagnosis not present

## 2022-07-15 DIAGNOSIS — R69 Illness, unspecified: Secondary | ICD-10-CM | POA: Diagnosis not present

## 2022-07-15 DIAGNOSIS — F17210 Nicotine dependence, cigarettes, uncomplicated: Secondary | ICD-10-CM | POA: Diagnosis not present

## 2022-07-15 MED ORDER — TRELEGY ELLIPTA 100-62.5-25 MCG/ACT IN AEPB: 1.0000 | INHALATION_SPRAY | Freq: Every day | RESPIRATORY_TRACT | 11 refills | Status: DC

## 2022-07-15 MED ORDER — ALBUTEROL SULFATE HFA 108 (90 BASE) MCG/ACT IN AERS: 2.0000 | INHALATION_SPRAY | Freq: Four times a day (QID) | RESPIRATORY_TRACT | 2 refills | Status: DC | PRN

## 2022-07-15 MED ORDER — TRELEGY ELLIPTA 100-62.5-25 MCG/ACT IN AEPB: 1.0000 | INHALATION_SPRAY | Freq: Every day | RESPIRATORY_TRACT | 0 refills | Status: DC

## 2022-07-15 NOTE — Progress Notes (Addendum)
Subjective:    Patient ID: Jeffrey Torres, male    DOB: 09/29/1945, 77 y.o.   MRN: [id number] Patient Care Team: Birdie Sons, MD as PCP - General (Family Medicine) Kate Sable, MD as PCP - Cardiology (Cardiology) Birder Robson, MD as Referring Physician (Ophthalmology) Carloyn Manner, MD as Referring Physician (Otolaryngology) Noreene Filbert, MD as Referring Physician (Radiation Oncology) Isaias Sakai, MD as Referring Physician (Ophthalmology) Telford Nab, RN as Oncology Nurse Navigator Tyler Pita, MD as Consulting Physician (Pulmonary Disease) Valente David, RN as IXL Management  Chief Complaint  Patient presents with   Follow-up    Little SOB when he is outside in the cool air. Occasional wheezing. No cough.    HPI Patient is a 77 year old occasional smoker (62 PY) who presents for follow-up on the issue of right upper lobe non-small cell cancer of the lung status post lobectomy on 28 May 2022 by Dr. Merilynn Finland.  Recall that the patient had robotic assisted navigational bronchoscopy on 27 April 2022 for a right upper lobe lung nodule/mass.  Patient developed postprocedural pneumothorax and was managed with a pneumothorax catheter, he did not require admission at that time.  The patient underwent right upper lobectomy on 28 May 2022, pathology showed poorly differentiated carcinoma more consistent with a poorly differentiated squamous cell carcinoma.  Patient was last seen here on 02 June 2022 and at that time was having minor issues postop, he appeared mildly dehydrated and was encouraged to increase his p.o. intake of fluids.  He did not exhibit orthostatic symptoms at that time.  He was admitted on 25 December after an episode of syncope at home and was noted to be volume depleted.  The was admitted briefly for volume resuscitation and then discharged without sequela.  He has not had any other issues since that  incident.  Respiratory wise, he has mild to moderate shortness of breath with exertion.  It has become more pronounced since prior to surgery. Cough is productive of clear sputum no hemoptysis.  No purulence.  He has had no fevers, chills or sweats since his lobectomy.  Does not endorse any other symptomatology today.  He has had no wheezing.  No issues with chest pain.    Review of Systems A 10 point review of systems was performed and it is as noted above otherwise negative.  Patient Active Problem List   Diagnosis Date Noted   Elevated serum creatinine 07/03/2022   Prediabetes 07/03/2022   Anemia 07/03/2022   Syncope and collapse 06/23/2022   PAF (paroxysmal atrial fibrillation) ([place]) 06/23/2022   Dyslipidemia 06/23/2022   Chronic kidney disease, stage 3a ([place]) 06/23/2022   Lung cancer ([place]) 05/28/2022   Depression 04/22/2022   Right upper lobe pulmonary nodule 03/20/2022   Dizziness 03/14/2022   Hypokalemia 03/14/2022   Hypernatremia 07/31/2020   History of colonic polyps    Severe obstructive sleep apnea 02/21/2020   Primary hypertension 08/14/2019   Atrial bigeminy 08/14/2019   History of laryngeal cancer 12/28/2017   History of adenomatous polyp of colon 10/30/2015   Inguinal hernia, bilateral 10/21/2015   PVC (premature ventricular contraction) 10/21/2015   Tobacco dependence 83/41/9622   Umbilical hernia 29/79/8921   Social History   Tobacco Use   Smoking status: Some Days    Packs/day: 1.00    Years: 50.00    Total pack years: 50.00    Types: Cigarettes    Start date: 8   Smokeless tobacco: Never  Tobacco comments:    Smokes 1-2 cigarettes some days- 07/15/2022  Substance Use Topics   Alcohol use: Not Currently    Alcohol/week: 0.0 standard drinks of alcohol   Allergies  Allergen Reactions   Penicillins Anaphylaxis and Swelling    Did it involve swelling of the face/tongue/throat, SOB, or low BP? Yes Did it involve sudden or severe rash/hives, skin  peeling, or any reaction on the inside of your mouth or nose? No Did you need to seek medical attention at a hospital or doctor's office? No When did it last happen?      10 + years ago If all above answers are "NO", may proceed with cephalosporin use.    Atorvastatin Other (See Comments)    myalgia   Trazodone And Nefazodone     Caused fainting spells   Current Meds  Medication Sig   aspirin 81 MG tablet Take 81 mg by mouth daily.   cycloSPORINE (RESTASIS) 0.05 % ophthalmic emulsion Place 1 drop into both eyes daily as needed (dry eyes).   diltiazem (CARDIZEM CD) 120 MG 24 hr capsule Take 1 capsule (120 mg total) by mouth daily.   fluticasone (FLONASE) 50 MCG/ACT nasal spray Place 2 sprays into both nostrils every morning.    Multiple Vitamins-Minerals (PRESERVISION AREDS 2 PO) Take 2 capsules by mouth every morning.    tamsulosin (FLOMAX) 0.4 MG CAPS capsule Take 1 capsule (0.4 mg total) by mouth daily after supper.   Immunization History  Administered Date(s) Administered   Fluad Quad(high Dose 65+) 08/07/2020, 03/18/2021, 03/17/2022   Influenza, High Dose Seasonal PF 03/25/2017, 03/29/2018, 04/24/2019   Pneumococcal Conjugate-13 04/26/2014   Pneumococcal Polysaccharide-23 05/28/2012       Objective:   Physical Exam BP (!) 174/82 (BP Location: Left Arm, Cuff Size: Normal)   Pulse (!) 50   SpO2 97%   SpO2: 97 % O2 Device: None (Room air)  GENERAL: Well-developed, overweight gentleman in no acute distress, fully ambulatory, no conversational dyspnea.  Fluent speech. HEAD: Normocephalic, atraumatic.  EYES: Pupils equal, round, reactive to light.  No scleral icterus.  MOUTH: Poor dentition, oral mucosa moist.  No thrush. NECK: Supple. No thyromegaly. Trachea midline. No JVD.  No adenopathy. PULMONARY: Symmetrical lung sounds good air entry bilaterally.  No adventitious sounds.   CARDIOVASCULAR: S1 and S2.  Bradycardic with regular rate and rhythm, occasional extrasystoles.   No rubs, murmurs or gallops heard. ABDOMEN: Benign. MUSCULOSKELETAL: No joint deformity, no clubbing, no edema.  NEUROLOGIC: No overt focal deficit, no gait disturbance, speech is fluent. SKIN: Intact,warm,dry. PSYCH: Mood and behavior normal   Recent Results (from the past 2160 hour(s))  NM Myocar Multi W/Spect W/Wall Motion / EF     Status: None   Collection Time: 04/23/22 10:46 AM  Result Value Ref Range   Rest HR 47.0 bpm   Rest BP 133/67 mmHg   Peak HR 86 bpm   Peak BP 133/63 mmHg   ST Depression (mm) 0 mm   Rest Nuclear Isotope Dose 10.3 mCi   Stress Nuclear Isotope Dose 31.5 mCi   SSS 1.0    SRS 0.0    SDS 1.0    TID 1.00    LV sys vol 31.0 mL   LV dias vol 100.0 62 - 150 mL   Nuc Stress EF 69 %  SARS CORONAVIRUS 2 (TAT 6-24 HRS) Anterior Nasal Swab     Status: None   Collection Time: 04/24/22  8:55 AM   Specimen: Anterior Nasal  Swab  Result Value Ref Range   SARS Coronavirus 2 NEGATIVE NEGATIVE    Comment: (NOTE) SARS-CoV-2 target nucleic acids are NOT DETECTED.  The SARS-CoV-2 RNA is generally detectable in upper and lower respiratory specimens during the acute phase of infection. Negative results do not preclude SARS-CoV-2 infection, do not rule out co-infections with other pathogens, and should not be used as the sole basis for treatment or other patient management decisions. Negative results must be combined with clinical observations, patient history, and epidemiological information. The expected result is Negative.  Fact Sheet for Patients: SugarRoll.be  Fact Sheet for Healthcare Providers: https://www.woods-mathews.com/  This test is not yet approved or cleared by the [place] FDA and  has been authorized for detection and/or diagnosis of SARS-CoV-2 by FDA under an Emergency Use Authorization (EUA). This EUA will remain  in effect (meaning this test can be used) for the duration of the COVID-19  declaration under Se ction 564(b)(1) of the Act, 21 U.S.C. section 360bbb-3(b)(1), unless the authorization is terminated or revoked sooner.  Performed at [facility], [place] [address] [place], [place] [address]   Cytology - Non PAP; RUL     Status: None   Collection Time: 04/27/22  1:25 PM  Result Value Ref Range   CYTOLOGY - NON GYN      CYTOLOGY - NON PAP CASE: [id number] PATIENT: [facility] Non-Gynecological Cytology Report     Specimen Submitted: A. Lung, RUL; FNA  Clinical History: 77 year old with prior laryngeal cancer s/p XRT now with RUL lung nodule/mass.      DIAGNOSIS: A. LUNG, RIGHT UPPER LOBE; ENB-ASSISTED FNA: - POSITIVE FOR MALIGNANCY. - CARCINOMA IS PRESENT, SEE COMMENT.  Specimen interpretation is limited by crush artifact and limited diagnostic material.  There is insufficient material for ancillary molecular testing.  Comment: A tiny fragment of crushed hyperchromatic cells is present in the cell block. This fragment is positive for superpancytokeratin and negative for CD56 and chromogranin. IHC performed on concurrent specimen [phone] with similar morphology demonstrates the cells are negative for p40, CD45, and synaptophysin. Given the limited amount of diagnostic material, prominent crush artifact, and non-conclusive pattern of immunoreactivity further sub classification of the carcinoma is not possible.  See concurrent cases [id number], -[id number], and [id number].  IHC slides were prepared by [facility] for Molecular Biology and Pathology, RTP, [place]. All controls stained appropriately.  This test was developed and its performance characteristics determined by [facility]. It has not been cleared or approved by the [place] Food and Drug Administration. The FDA does not require this test to go through premarket FDA review. This test is used for clinical purposes. It should not be regarded as investigational or for research. This  laboratory is certified under the Clinical Laboratory Improvement Amendments (CLIA) as qualified to perform high complexity clinical laboratory testing.   GROSS DESCRIPTION: A. Site: RUL Procedure: ENB Cytology specimen: FNA/needle biopsy and rinse Cytotechnologist(s): Chauncey Cruel, Juanetta Beets Specimen material collected and submitted for: 1 diff Quik stained slides 1 Pap stained slides Specimen ma terial submitted for: Cell block and ThinPrep  The cell block material is fixed in formalin for 6 hours prior to processing.  Specimen description: Fixative: CytoLyt Volume: Approximately 30 mL Color: Pink Transparency: Slightly cloudy Tissue fragments present: Yes Collection brush(es) present: No  CM 04/27/2022    Final Diagnosis performed by Quay Burow, MD.   Electronically signed 04/30/2022 2:58:29PM The electronic signature indicates that the named Attending Pathologist has evaluated the specimen  Technical component performed at [facility], [address], [place], [place] [address] Lab: [phone] Dir: Rush Farmer, MD, MMM  Professional component performed at [facility], [facility], [place], [place], [place] [address] Lab: [phone] Dir: Kathi Simpers, MD   Surgical pathology     Status: None   Collection Time: 04/27/22  1:32 PM  Result Value Ref Range   SURGICAL PATHOLOGY      SURGICAL PATHOLOGY CASE: [id number] PATIENT: [facility] Surgical Pathology Report     Specimen Submitted: A. Lung, RUL  Clinical History: 77 year old with prior laryngeal cancer s/p XRT now with RUL lung nodule/mass.      DIAGNOSIS: A. LUNG, RIGHT UPPER LOBE; ENB-ASSISTED BIOPSY: - POSITIVE FOR MALIGNANCY. - CARCINOMA IS PRESENT, SEE COMMENT.  See concurrent cases [id number], -[id number], and -868.  There is insufficient material for ancillary molecular testing.  Comment: The specimen contains a few tiny fragments of crushed  hyperchromatic cells. The malignant cells are negative for p40, CD45, and synaptophysin. IHC performed on concurrent specimen [id number] with similar morphology demonstrates the cells are positive for superpancytokeratin and negative for CD56 and chromogranin. Given the limited amount of diagnostic material, prominent crush artifact, and non-conclusive pattern of immunoreactivity further subclassification of the carcinoma is not possi ble.  IHC slides were prepared by [facility] for Molecular Biology and Pathology, RTP, [place]. All controls stained appropriately.  This test was developed and its performance characteristics determined by [facility]. It has not been cleared or approved by the [place] Food and Drug Administration. The FDA does not require this test to go through premarket FDA review. This test is used for clinical purposes. It should not be regarded as investigational or for research. This laboratory is certified under the Clinical Laboratory Improvement Amendments (CLIA) as qualified to perform high complexity clinical laboratory testing.   GROSS DESCRIPTION: A. Labeled: RUL biopsy Received: Formalin Collection time: 1:32 PM on 04/27/2022 Placed into formalin time: 1:32 PM on 04/27/2022 Tissue fragment(s): Multiple Size: Aggregate, 1.3 x 0.4 x 0.1 cm Description: Pink to red soft tissue fragments Entirely submitted in 1 cassette.  CM 04/27/2022  Final Diagnosis performed by Fernande Boyden, MD.   Electronically signed 04/30/2022 3:01:12PM The electronic signature indicates that the named Attending Pathologist has evaluated the specimen Technical component performed at [facility], [address], [place], [place] [address] Lab: [phone] Dir: Rush Farmer, MD, MMM  Professional component performed at [facility], [facility], [place], [place], [place] [address] Lab: [phone] Dir: Kathi Simpers, MD   Cytology - Non PAP; Transbronchial needle asp      Status: None   Collection Time: 04/27/22  1:51 PM  Result Value Ref Range   CYTOLOGY - NON GYN      CYTOLOGY - NON PAP CASE: [id number] PATIENT: [facility] Non-Gynecological Cytology Report     Specimen Submitted: A. Lung, RUL; brushing  Clinical History: 77 year old with prior laryngeal cancer s/p XRT now with RUL lung nodule/mass.      DIAGNOSIS: A. LUNG, RIGHT UPPER LOBE; ENB-ASSISTED BRUSHING: - ATYPICAL.  Specimen is adequate for interpretation.  See concurrent cases [id number], -[id number], and [id number].  Comment: Rare atypical cells are present in the ThinPrep and cell block slides.  GROSS DESCRIPTION: A. Site: RUL Procedure: ENB Cytology specimen: Brushing Cytotechnologist(s): Chauncey Cruel, Juanetta Beets Specimen material collected and submitted for: 2 diff Quik stained slides 0 Pap stained slides Specimen material submitted for: Cell block and ThinPrep  The cell block  material is fixed in formalin for 6 hours prior to processing.  Specimen description: Fixative: CytoLyt Volume: Approximately 25 mL Color: Pink Transparency: Sligh tly cloudy Tissue fragments present: Yes Collection brush(es) present: Yes, 1  CM 04/27/2022    Final Diagnosis performed by Quay Burow, MD.   Electronically signed 04/30/2022 2:59:45PM The electronic signature indicates that the named Attending Pathologist has evaluated the specimen Technical component performed at [facility], [address], [place], [place] [address] Lab: [phone] Dir: Rush Farmer, MD, MMM  Professional component performed at [facility], [facility], [place], [place], [place] [address] Lab: [phone] Dir: Kathi Simpers, MD   Cytology - Non PAP; RUL     Status: None   Collection Time: 04/27/22  2:01 PM  Result Value Ref Range   CYTOLOGY - NON GYN      CYTOLOGY - NON PAP CASE: [id number] PATIENT: [facility] Non-Gynecological Cytology  Report     Specimen Submitted: A. Lung, RUL; lavage  Clinical History: 77 year old with prior laryngeal cancer s/p XRT now with RUL lung nodule/mass.      DIAGNOSIS: A. LUNG, RIGHT UPPER LOBE; ENB-ASSISTED LAVAGE: - ATYPICAL.  Specimen is adequate for interpretation.  See concurrent cases [id number], -868, and [id number].  Comment: Rare atypical cells are present in the ThinPrep and cell block slides.  GROSS DESCRIPTION: A. Site: RUL Procedure: ENB Cytology specimen: Lavage Cytotechnologist(s): Chauncey Cruel, Juanetta Beets Specimen material collected and submitted for: 0 diff Quik stained slides 0 Pap stained slides Specimen material submitted for: Cell block and ThinPrep  The cell block material is fixed in formalin for 6 hours prior to processing.  Specimen description: Fixative: CytoLyt Volume: Approximately 35 mL Color: Red Transparency: Slightly clo udy Tissue fragments present: Yes Collection brush(es) present: No  CM 04/27/2022    Final Diagnosis performed by Quay Burow, MD.   Electronically signed 04/30/2022 3:00:42PM The electronic signature indicates that the named Attending Pathologist has evaluated the specimen Technical component performed at [facility] [place], [address] [place], [place] [address] Lab: [phone] Dir: Rush Farmer, MD, MMM  Professional component performed at [facility], [facility], [place], [place], [place] [address] Lab: [phone] Dir: Kathi Simpers, MD   Surgical pcr screen     Status: None   Collection Time: 05/26/22  2:47 PM   Specimen: Nasal Mucosa; Nasal Swab  Result Value Ref Range   MRSA, PCR NEGATIVE NEGATIVE   Staphylococcus aureus NEGATIVE NEGATIVE    Comment: (NOTE) The Xpert SA Assay (FDA approved for NASAL specimens in patients 76 years of age and older), is one component of a comprehensive surveillance program. It is not intended to diagnose infection nor to guide or monitor  treatment. Performed at [facility], [place] [address] [place], [place] [address]   SARS CORONAVIRUS 2 (TAT 6-24 HRS)     Status: None   Collection Time: 05/26/22  2:47 PM  Result Value Ref Range   SARS Coronavirus 2 NEGATIVE NEGATIVE    Comment: (NOTE) SARS-CoV-2 target nucleic acids are NOT DETECTED.  The SARS-CoV-2 RNA is generally detectable in upper and lower respiratory specimens during the acute phase of infection. Negative results do not preclude SARS-CoV-2 infection, do not rule out co-infections with other pathogens, and should not be used as the sole basis for treatment or other patient management decisions. Negative results must be combined with clinical observations, patient history, and epidemiological information. The expected result is Negative.  Fact Sheet for Patients: SugarRoll.be  Fact Sheet for Healthcare Providers: https://www.woods-mathews.com/  This test is not yet approved or cleared by the [place] FDA and  has been authorized for detection and/or diagnosis of SARS-CoV-2 by FDA under an Emergency Use Authorization (EUA). This EUA will remain  in effect (meaning this test can be used) for the duration of the COVID-19 declaration under Se ction 564(b)(1) of the Act, 21 U.S.C. section 360bbb-3(b)(1), unless the authorization is terminated or revoked sooner.  Performed at [facility], [place] [address] [place], [place] [address]   Blood gas, arterial on room air     Status: Abnormal   Collection Time: 05/26/22  3:00 PM  Result Value Ref Range   pH, Arterial 7.45 7.35 - 7.45   pCO2 arterial 39 32 - 48 mmHg   pO2, Arterial 97 83 - 108 mmHg   Bicarbonate 27.1 20.0 - 28.0 mmol/L   Acid-Base Excess 3.0 (H) 0.0 - 2.0 mmol/L   O2 Saturation 98.3 %   Patient temperature 37.0    Collection site LEFT BRACHIAL    Drawn by [id number]     Comment: Performed at [facility] [address]  [place], [place] [address]  Type and screen     Status: None   Collection Time: 05/26/22  3:00 PM  Result Value Ref Range   ABO/RH(D) AB NEG    Antibody Screen NEG    Sample Expiration 06/09/2022,2359    Extend sample reason      NO TRANSFUSIONS OR PREGNANCY IN THE PAST 3 MONTHS Performed at [facility], [place] [address], [place], [place] [address]   Urinalysis, Routine w reflex microscopic     Status: Abnormal   Collection Time: 05/26/22  3:21 PM  Result Value Ref Range   Color, Urine YELLOW YELLOW   APPearance HAZY (A) CLEAR   Specific Gravity, Urine 1.012 1.005 - 1.030   pH 5.0 5.0 - 8.0   Glucose, UA NEGATIVE NEGATIVE mg/dL   Hgb urine dipstick NEGATIVE NEGATIVE   Bilirubin Urine NEGATIVE NEGATIVE   Ketones, ur NEGATIVE NEGATIVE mg/dL   Protein, ur NEGATIVE NEGATIVE mg/dL   Nitrite NEGATIVE NEGATIVE   Leukocytes,Ua SMALL (A) NEGATIVE   RBC / HPF 0-5 0 - 5 RBC/hpf   WBC, UA 6-10 0 - 5 WBC/hpf   Bacteria, UA FEW (A) NONE SEEN   Mucus PRESENT    Sperm, UA PRESENT     Comment: Performed at [facility] [address] [place], [place] [address]  CBC     Status: Abnormal   Collection Time: 05/26/22  3:30 PM  Result Value Ref Range   WBC 11.5 (H) 4.0 - 10.5 K/uL   RBC 4.88 4.22 - 5.81 MIL/uL   Hemoglobin 15.7 13.0 - 17.0 g/dL   HCT 45.7 39.0 - 52.0 %   MCV 93.6 80.0 - 100.0 fL   MCH 32.2 26.0 - 34.0 pg   MCHC 34.4 30.0 - 36.0 g/dL   RDW 12.9 11.5 - 15.5 %   Platelets 251 150 - 400 K/uL   nRBC 0.0 0.0 - 0.2 %    Comment: Performed at [facility], [place] [address] [place], [place] [address]  Comprehensive metabolic panel     Status: Abnormal   Collection Time: 05/26/22  3:30 PM  Result Value Ref Range   Sodium 138 135 - 145 mmol/L   Potassium 3.9 3.5 - 5.1 mmol/L   Chloride 101 98 - 111 mmol/L  CO2 23 22 - 32 mmol/L   Glucose, Bld 78 70 - 99 mg/dL    Comment: Glucose reference range applies only to samples taken after fasting for at least 8 hours.   BUN  20 8 - 23 mg/dL   Creatinine, Ser 1.36 (H) 0.61 - 1.24 mg/dL   Calcium 9.3 8.9 - 10.3 mg/dL   Total Protein 6.8 6.5 - 8.1 g/dL   Albumin 4.0 3.5 - 5.0 g/dL   AST 25 15 - 41 U/L   ALT 21 0 - 44 U/L   Alkaline Phosphatase 48 38 - 126 U/L   Total Bilirubin 0.6 0.3 - 1.2 mg/dL   GFR, Estimated 54 (L) >60 mL/min    Comment: (NOTE) Calculated using the CKD-EPI Creatinine Equation (2021)    Anion gap 14 5 - 15    Comment: Performed at [facility] [address], [place], [place] [address]  Protime-INR     Status: None   Collection Time: 05/26/22  3:30 PM  Result Value Ref Range   Prothrombin Time 13.5 11.4 - 15.2 seconds   INR 1.0 0.8 - 1.2    Comment: (NOTE) INR goal varies based on device and disease states. Performed at [facility], [place] [address], [place] [address]   APTT     Status: None   Collection Time: 05/26/22  3:30 PM  Result Value Ref Range   aPTT 30 24 - 36 seconds    Comment: Performed at [facility] [address] [place], [place] [address]  ABO/Rh     Status: None   Collection Time: 05/28/22  6:45 AM  Result Value Ref Range   ABO/RH(D)      AB NEG Performed at [facility] [address], [place], [place] [id number]   Basic metabolic panel     Status: Abnormal   Collection Time: 05/28/22  6:45 AM  Result Value Ref Range   Sodium 139 135 - 145 mmol/L   Potassium 3.4 (L) 3.5 - 5.1 mmol/L   Chloride 101 98 - 111 mmol/L   CO2 25 22 - 32 mmol/L   Glucose, Bld 113 (H) 70 - 99 mg/dL    Comment: Glucose reference range applies only to samples taken after fasting for at least 8 hours.   BUN 29 (H) 8 - 23 mg/dL   Creatinine, Ser 1.43 (H) 0.61 - 1.24 mg/dL   Calcium 9.3 8.9 - 10.3 mg/dL   GFR, Estimated 51 (L) >60 mL/min    Comment: (NOTE) Calculated using the CKD-EPI Creatinine Equation (2021)    Anion gap 13 5 - 15    Comment: Performed at [facility] [address] [place] [address]  CBC     Status: None    Collection Time: 05/28/22  6:45 AM  Result Value Ref Range   WBC 7.9 4.0 - 10.5 K/uL   RBC 4.62 4.22 - 5.81 MIL/uL   Hemoglobin 14.7 13.0 - 17.0 g/dL   HCT 45.7 39.0 - 52.0 %   MCV 98.9 80.0 - 100.0 fL   MCH 31.8 26.0 - 34.0 pg   MCHC 32.2 30.0 - 36.0 g/dL   RDW 13.1 11.5 - 15.5 %   Platelets 206 150 - 400 K/uL   nRBC 0.0 0.0 - 0.2 %    Comment: Performed at [facility], [place] [address] [place], [place] [address]  Surgical pathology     Status: None  Collection Time: 05/28/22  8:36 AM  Result Value Ref Range   SURGICAL PATHOLOGY      SURGICAL PATHOLOGY CASE: [id number] PATIENT: [facility] Surgical Pathology Report     Clinical History: RUL lung cancer (cm)     FINAL MICROSCOPIC DIAGNOSIS:  A. LUNG, RIGHT UPPER LOBE, LOBECTOMY: - Poorly differentiated carcinoma, 2.7 cm.  See comment - Carcinoma focally involves the visceral pleural surface - Resection margins are negative for carcinoma - Fifteen benign lymph nodes (0/15) - See oncology table  B. LYMPH NODE, 8, EXCISION: - Lymph node, negative for carcinoma (0/1)  C. LYMPH NODE, LEVEL 7, EXCISION: - Lymph node, negative for carcinoma (0/1)  D. LYMPH NODE, LEVEL 10, EXCISION: - Lymph node, negative for carcinoma (0/1)  E. LYMPH NODE, LEVEL 10 #2, EXCISION: - Lymph node, negative for carcinoma (0/1)  F. LYMPH NODE, 4R, EXCISION: - Lymph node, negative for carcinoma (0/1)  G. LYMPH NODE, LEVEL 12, EXCISION: - Lymph node, negative for carcinoma (0/1)  H. LYMPH NODE, LEVEL 12 #2, EXCISION: - Lymph node, negative for carcinoma  (0/1)  I. LYMPH NODE, LEVEL 11, EXCISION: - Lymph node, negative for carcinoma (0/1)  J. LYMPH NODE, LEVEL 11 #2, EXCISION: - Lymph node, negative for carcinoma (0/1)  K. LYMPH NODE, LEVEL 11 #3, EXCISION: - Lymph node, negative for carcinoma (0/1)  L. LYMPH NODE, LEVEL 12 #3, EXCISION: - Lymph node, negative for carcinoma  (0/1)      COMMENT:  Immunohistochemical stains show that the tumor cells are diffusely positive for cytokeratin AE1/AE3 with patchy labeling for CK5/6.  The tumor cells are negative for synaptophysin, chromogranin, CD56, TTF-1 and p53.  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 Differential diagnosis for the lung tumor includes a primary lung squamous carcinoma and metastasis from the laryngeal carcinoma. Clinical correlation is suggested.  Dr. Arby Barrette reviewed the case and concurs with the above dia gnosis.  Please note that the oncology table is based on the possibility of this tumor being a primary lung carcinoma.     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 Visceral Pleura Invasion: Present Direct Invasion of Adjacent Structures: No adjacent structures present Lymphovascular Invasion: Not identified Margins: All margins negative for invasive carcinoma      Closest Margin(s) to Invasive Carcinoma: Bronchovascular margin      Margin(s) Involved by Invasive Carcinoma: Not applicable       Margin Status for Non-Invasive Tumor: Not applicable Treatment Effect: No known presurgical therapy Regional Lymph Nodes:      Number of Lymph Nodes Involved: 0                            Nodal Sites with Tumor: Not applicable      Number of Lymph Nodes Examined: 26                      Nodal Sites Examined: Levels 7, 8, 10, 11, 12 Distant Metastasis:      Distant Site(s) Involved: Not applicable Pathologic Stage Classification (pTNM, AJCC 8th  Edition): pT2, pN0 Ancillary Studies: Can be performed upon request Representative Tumor  Block: A2  (v4.2.0.1)      INTRAOPERATIVE DIAGNOSIS:  A. Right Upper Lobe for Bronchial Margin: "Benign." Intraoperative diagnosis rendered by Dr. Saralyn Pilar at 11:28 on 28 May 2022.  GROSS DESCRIPTION:  A. Received fresh for intraoperative consultation labeled with the patient's name and "Right upper lobe" is a 16.7 x 13.0 x 4.9 cm lung lobe covered in pleura the black webbing. The bronchial margin is submitted en face on a single chuck for frozen section diagnosis and subsequently in cassette A1 for permanent. Sectioning reveals a 2.7 x 2.4 x 1.7 cm well-defined, white-tan, lobulated mass locate d 1.9 cm from the bronchial margin and 0.1 cm subjacent to the pleura. The remaining, uninvolved parenchyma is red-tan and spongy without additional masses or lesions. Along the bronchial tree, 20 possible lymph nodes ranging from 0.2-2.1 cm in greatest dimension are identified. Block Summary A1: frozen section remnant A2-A3: mass with overlying pleura (inked black) A4-A5: mass with bronchial tree A6: mass with adjacent normal parenchyma A7, A8: parenchyma away from mass A9: one sectioned node A10: one bisected node A11: five whole nodes A12: six whole nodes A13: six whole nodes  B. Received in saline labeled with the patient's name and "Level 8" is a 1.8 x 1.5 x 0.3 cm tan, rubbery possible lymph node that is submitted in toto in cassette B1.  C. Received in saline labeled with the patient's name and "Level 7 node" is a 1.8 x 1.6 x 0.2 cm aggregate of tan soft tissue fragments, submitted in toto in cassette C1.  D. Received in saline labeled with the patient's  name and "Level 10 node" is a 1.2 x 1.0 x 0.3 cm tan, rubbery possible lymph node that is submitted in toto in cassette D1.  E. Received in saline labeled with the patient's name and "Level 10 node #2" is a 0.5 x 0.5 x 0.2  cm tan, rubbery possible lymph node that is submitted in toto in cassette E1.  F. Received in saline labeled with the patient's name and "4R node" is a 0.2 x 0.2 x 0.2 cm tan, rubbery possible lymph node that is submitted in toto in cassette F1.  G. Received in saline labeled with the patient's name and "Level 12 node" is a 0.8 x 0.8 x 0.5 cm tan, rubbery possible lymph node that is sectioned and entirely submitted in cassette G1.  H. Received in saline labeled with the patient's name and "Level 12 node #2" is a 0.5 x 0.4 x 0.3 cm tan, rubbery possible lymph node that is submitted in toto in cassette H1.  I. Received in saline labeled with the patient's name and "Level 11 node" is a 0.5 x 0.4 x 0.2 cm tan, rubbery possible lymph node that is su bmitted in toto in cassette I1.  J. Received in saline labeled with the patient's name and "Level 11 node #2" is a 0.6 x 0.5 x 0.2 cm tan, rubbery possible lymph node that is submitted in toto in cassette J1.  K. Received in saline labeled with the patient's name and "Level 11 node #2" is a 0.3 x 0.3 x 0.2 cm tan, rubbery possible lymph node that is submitted in toto in cassette K1.  L. Received in saline labeled with the patient's name and "Level 12 node #3" is a 0.8 x 0.7 x 0.2 cm aggregate of tan soft tissue fragments, submitted in toto in cassette L1.  (LEF 05/29/2022)     Final Diagnosis performed by Jaquita Folds,  MD.   Electronically signed 06/04/2022 Technical component performed at [employer]. [facility], [place] [address] [place], [place] [address].  Professional component performed at [facility], [place] [address] [place], [place] [address].  Immunohistochemistry Technical component (if applicable) was performed at [employer]. [address] [place], [place], [place] [address].   IMMUNOHISTOCHEMISTRY DISCLAIMER (if applicable): Some of these immunohistochemical stains may  have been developed and the performance characteristics determine by [facility]. Some may not have been cleared or approved by the U.S. Food and Drug Administration. The FDA has determined that such clearance or approval is not necessary. This test is used for clinical purposes. It should not be regarded as investigational or for research. This laboratory is certified under the [place] (CLIA-88) as qualified to perform high complexity clinical laboratory testing.  The controls stained appropriately.   CBC     Status: Abnormal   Collection Time: 05/29/22 12:24 AM  Result Value Ref Range   WBC 15.0 (H) 4.0 - 10.5 K/uL   RBC 4.15 (L) 4.22 - 5.81 MIL/uL   Hemoglobin 13.2 13.0 - 17.0 g/dL   HCT 37.9 (L) 39.0 - 52.0 %   MCV 91.3 80.0 - 100.0 fL    Comment: DELTA CHECK NOTED   MCH 31.8 26.0 - 34.0 pg   MCHC 34.8 30.0 - 36.0 g/dL   RDW 12.9 11.5 - 15.5 %   Platelets 222 150 - 400 K/uL   nRBC 0.0 0.0 - 0.2 %    Comment: Performed at [facility], [place] [address], [place], [place] [id number]  Basic metabolic panel     Status: Abnormal   Collection Time: 05/29/22 12:24 AM  Result Value Ref Range   Sodium 132 (L) 135 - 145 mmol/L    Comment: DELTA CHECK NOTED   Potassium 3.9 3.5 - 5.1 mmol/L   Chloride 96 (L) 98 - 111 mmol/L   CO2 26 22 - 32 mmol/L   Glucose, Bld 193 (H) 70 - 99 mg/dL    Comment: Glucose reference range applies only to samples taken after fasting for at least 8 hours.   BUN 22 8 - 23 mg/dL   Creatinine, Ser 1.32 (H) 0.61 - 1.24 mg/dL   Calcium 8.6 (L) 8.9 - 10.3 mg/dL   GFR, Estimated 56 (L) >60 mL/min    Comment: (NOTE) Calculated using the CKD-EPI Creatinine Equation (2021)    Anion gap 10 5 - 15    Comment: Performed at [facility] [address], [place], [place] [address]  CBC     Status: Abnormal   Collection Time: 05/30/22 12:14 AM  Result Value Ref Range   WBC 11.8 (H) 4.0 - 10.5 K/uL    RBC 4.14 (L) 4.22 - 5.81 MIL/uL   Hemoglobin 13.4 13.0 - 17.0 g/dL   HCT 38.1 (L) 39.0 - 52.0 %   MCV 92.0 80.0 - 100.0 fL   MCH 32.4 26.0 - 34.0 pg   MCHC 35.2 30.0 - 36.0 g/dL   RDW 13.0 11.5 - 15.5 %   Platelets 181 150 - 400 K/uL   nRBC 0.0 0.0 - 0.2 %    Comment: Performed at [facility], [place] [address], [place], [place] [address]  Comprehensive metabolic panel     Status: Abnormal   Collection Time: 05/30/22 12:14 AM  Result Value Ref Range   Sodium 137 135 - 145 mmol/L  Potassium 3.4 (L) 3.5 - 5.1 mmol/L   Chloride 104 98 - 111 mmol/L   CO2 25 22 - 32 mmol/L   Glucose, Bld 183 (H) 70 - 99 mg/dL    Comment: Glucose reference range applies only to samples taken after fasting for at least 8 hours.   BUN 18 8 - 23 mg/dL   Creatinine, Ser 1.37 (H) 0.61 - 1.24 mg/dL   Calcium 8.5 (L) 8.9 - 10.3 mg/dL   Total Protein 5.6 (L) 6.5 - 8.1 g/dL   Albumin 3.0 (L) 3.5 - 5.0 g/dL   AST 55 (H) 15 - 41 U/L   ALT 25 0 - 44 U/L   Alkaline Phosphatase 36 (L) 38 - 126 U/L   Total Bilirubin 0.4 0.3 - 1.2 mg/dL   GFR, Estimated 54 (L) >60 mL/min    Comment: (NOTE) Calculated using the CKD-EPI Creatinine Equation (2021)    Anion gap 8 5 - 15    Comment: Performed at [facility], [place] [address] [place], [place] [id number]  Basic metabolic panel     Status: Abnormal   Collection Time: 05/31/22 12:11 AM  Result Value Ref Range   Sodium 138 135 - 145 mmol/L   Potassium 4.6 3.5 - 5.1 mmol/L   Chloride 98 98 - 111 mmol/L   CO2 30 22 - 32 mmol/L   Glucose, Bld 129 (H) 70 - 99 mg/dL    Comment: Glucose reference range applies only to samples taken after fasting for at least 8 hours.   BUN 13 8 - 23 mg/dL   Creatinine, Ser 1.28 (H) 0.61 - 1.24 mg/dL   Calcium 8.6 (L) 8.9 - 10.3 mg/dL   GFR, Estimated 58 (L) >60 mL/min    Comment: (NOTE) Calculated using the CKD-EPI Creatinine Equation (2021)    Anion gap 10 5 - 15    Comment: Performed at [facility] [address] [place], [place] [id number]  Basic metabolic panel     Status: Abnormal   Collection Time: 06/22/22 10:28 PM  Result Value Ref Range   Sodium 140 135 - 145 mmol/L   Potassium 3.6 3.5 - 5.1 mmol/L   Chloride 105 98 - 111 mmol/L   CO2 25 22 - 32 mmol/L   Glucose, Bld 163 (H) 70 - 99 mg/dL    Comment: Glucose reference range applies only to samples taken after fasting for at least 8 hours.   BUN 29 (H) 8 - 23 mg/dL   Creatinine, Ser 1.90 (H) 0.61 - 1.24 mg/dL   Calcium 8.9 8.9 - 10.3 mg/dL   GFR, Estimated 36 (L) >60 mL/min    Comment: (NOTE) Calculated using the CKD-EPI Creatinine Equation (2021)    Anion gap 10 5 - 15    Comment: Performed at [facility] [address] [place] [address]  CBC     Status: None   Collection Time: 06/22/22 10:28 PM  Result Value Ref Range   WBC 9.2 4.0 - 10.5 K/uL   RBC 4.56 4.22 - 5.81 MIL/uL   Hemoglobin 14.5 13.0 - 17.0 g/dL   HCT 42.9 39.0 - 52.0 %   MCV 94.1 80.0 - 100.0 fL   MCH 31.8 26.0 - 34.0 pg   MCHC 33.8 30.0 - 36.0 g/dL   RDW 12.9 11.5 - 15.5 %   Platelets 279 150 - 400 K/uL   nRBC 0.0 0.0 - 0.2 %    Comment: Performed at [facility]  [facility], [place] [address], [place], [place] [address]  Hepatic function panel     Status: Abnormal   Collection Time: 06/22/22 10:28 PM  Result Value Ref Range   Total Protein 6.2 (L) 6.5 - 8.1 g/dL   Albumin 3.5 3.5 - 5.0 g/dL   AST 34 15 - 41 U/L   ALT 22 0 - 44 U/L   Alkaline Phosphatase 43 38 - 126 U/L   Total Bilirubin 1.0 0.3 - 1.2 mg/dL   Bilirubin, Direct 0.2 0.0 - 0.2 mg/dL   Indirect Bilirubin 0.8 0.3 - 0.9 mg/dL    Comment: Performed at [facility] [address], [place], [place] [address]  Troponin I (High Sensitivity)     Status: Abnormal   Collection Time: 06/22/22 10:28 PM  Result Value Ref Range   Troponin I (High Sensitivity) 24 (H) <18 ng/L    Comment: (NOTE) Elevated high sensitivity troponin I (hsTnI) values and significant  changes across serial  measurements may suggest ACS but many other  chronic and acute conditions are known to elevate hsTnI results.  Refer to the "Links" section for chest pain algorithms and additional  guidance. Performed at [facility], [place] [address] [place], [place] [address]   CBG monitoring, ED     Status: Abnormal   Collection Time: 06/22/22 11:46 PM  Result Value Ref Range   Glucose-Capillary 144 (H) 70 - 99 mg/dL    Comment: Glucose reference range applies only to samples taken after fasting for at least 8 hours.  Urinalysis, Routine w reflex microscopic Urine, Clean Catch     Status: Abnormal   Collection Time: 06/23/22 12:04 AM  Result Value Ref Range   Color, Urine YELLOW YELLOW   APPearance HAZY (A) CLEAR   Specific Gravity, Urine 1.020 1.005 - 1.030   pH 5.0 5.0 - 8.0   Glucose, UA NEGATIVE NEGATIVE mg/dL   Hgb urine dipstick NEGATIVE NEGATIVE   Bilirubin Urine NEGATIVE NEGATIVE   Ketones, ur NEGATIVE NEGATIVE mg/dL   Protein, ur NEGATIVE NEGATIVE mg/dL   Nitrite NEGATIVE NEGATIVE   Leukocytes,Ua NEGATIVE NEGATIVE    Comment: Performed at [facility] [address], [place], [place] [address]  Troponin I (High Sensitivity)     Status: Abnormal   Collection Time: 06/23/22 12:26 AM  Result Value Ref Range   Troponin I (High Sensitivity) 27 (H) <18 ng/L    Comment: (NOTE) Elevated high sensitivity troponin I (hsTnI) values and significant  changes across serial measurements may suggest ACS but many other  chronic and acute conditions are known to elevate hsTnI results.  Refer to the "Links" section for chest pain algorithms and additional  guidance. Performed at [facility], [place] [address] [place], [place] [id number]   Basic metabolic panel     Status: Abnormal   Collection Time: 06/24/22  3:08 AM  Result Value Ref Range   Sodium 140 135 - 145 mmol/L   Potassium 3.1 (L) 3.5 - 5.1 mmol/L   Chloride 106 98 - 111 mmol/L   CO2 26 22 - 32 mmol/L   Glucose, Bld  102 (H) 70 - 99 mg/dL    Comment: Glucose reference range applies only to samples taken after fasting for at least 8 hours.   BUN 19 8 - 23 mg/dL   Creatinine, Ser 1.25 (H) 0.61 - 1.24 mg/dL   Calcium 8.6 (L) 8.9 - 10.3 mg/dL   GFR, Estimated >60 >60 mL/min  Comment: (NOTE) Calculated using the CKD-EPI Creatinine Equation (2021)    Anion gap 8 5 - 15    Comment: Performed at [facility], [place] [address] [place], [place] [address]  CBC     Status: Abnormal   Collection Time: 06/24/22  3:08 AM  Result Value Ref Range   WBC 7.3 4.0 - 10.5 K/uL   RBC 4.21 (L) 4.22 - 5.81 MIL/uL   Hemoglobin 13.6 13.0 - 17.0 g/dL   HCT 38.1 (L) 39.0 - 52.0 %   MCV 90.5 80.0 - 100.0 fL   MCH 32.3 26.0 - 34.0 pg   MCHC 35.7 30.0 - 36.0 g/dL   RDW 12.8 11.5 - 15.5 %   Platelets 208 150 - 400 K/uL   nRBC 0.0 0.0 - 0.2 %    Comment: Performed at [facility], [place] [address], [place],  [address]  Comprehensive Metabolic Panel (CMET)     Status: Abnormal   Collection Time: 07/03/22 11:47 AM  Result Value Ref Range   Glucose 173 (H) 70 - 99 mg/dL   BUN 17 8 - 27 mg/dL   Creatinine, Ser 1.25 0.76 - 1.27 mg/dL   eGFR 60 >59 mL/min/1.73   BUN/Creatinine Ratio 14 10 - 24   Sodium 141 134 - 144 mmol/L   Potassium 3.8 3.5 - 5.2 mmol/L   Chloride 102 96 - 106 mmol/L   CO2 24 20 - 29 mmol/L   Calcium 8.7 8.6 - 10.2 mg/dL   Total Protein 6.6 6.0 - 8.5 g/dL   Albumin 4.4 3.8 - 4.8 g/dL   Globulin, Total 2.2 1.5 - 4.5 g/dL   Albumin/Globulin Ratio 2.0 1.2 - 2.2   Bilirubin Total <0.2 0.0 - 1.2 mg/dL   Alkaline Phosphatase 62 44 - 121 IU/L   AST 23 0 - 40 IU/L   ALT 18 0 - 44 IU/L  CBC     Status: None   Collection Time: 07/03/22 11:47 AM  Result Value Ref Range   WBC 7.9 3.4 - 10.8 x10E3/uL   RBC 4.70 4.14 - 5.80 x10E6/uL   Hemoglobin 14.4 13.0 - 17.7 g/dL   Hematocrit 43.1 37.5 - 51.0 %   MCV 92 79 - 97 fL   MCH 30.6 26.6 - 33.0 pg   MCHC 33.4 31.5 - 35.7 g/dL   RDW 12.4 11.6 -  15.4 %   Platelets 272 150 - 450 x10E3/uL  Hemoglobin A1c     Status: Abnormal   Collection Time: 07/03/22 11:47 AM  Result Value Ref Range   Hgb A1c MFr Bld 5.8 (H) 4.8 - 5.6 %    Comment:          Prediabetes: 5.7 - 6.4          Diabetes: >6.4          Glycemic control for adults with diabetes: <7.0    Est. average glucose Bld gHb Est-mCnc 120 mg/dL   SPIROMETRY performed today: FEV1 1.46 L or 46% predicted, FVC 2.39 L or 54% predicted, FEV1/FVC 61%, mid flows are reduced.  Consistent with moderate to severe COPD.    Assessment & Plan:     ICD-10-CM   1. Stage 2 moderate COPD by GOLD classification ([place])  J44.9 Spirometry with Graph    Pulmonary Function Test [facility] Only   Trial of Trelegy Ellipta 100, 1 puff daily Albuterol as needed Formal PFTs prior to return Follow-up in 3 months or as needed  2. Non-small cell cancer of right lung (HCC)  C34.91    Continues to follow-up with oncology Stage Ib Status post lobectomy 28 May 2022    3. Tobacco dependence due to cigarettes  F17.210    He has relapsed into smoking Smoking no more than 3 cigarettes a day Counseled regards to total abstinence necessary     Orders Placed This Encounter  Procedures   Pulmonary Function Test [facility] Only    Standing Status:   Future    Standing Expiration Date:   07/16/2023    Order Specific Question:   Full PFT: includes the following: basic spirometry, spirometry pre & post bronchodilator, diffusion capacity (DLCO), lung volumes    Answer:   Full PFT    Order Specific Question:   This test can only be performed at    Answer:   [facility]   Spirometry with Graph    Order Specific Question:   Where should this test be performed?    Answer:   [facility]   Meds ordered this encounter  Medications   Fluticasone-Umeclidin-Vilant (TRELEGY ELLIPTA) 100-62.5-25 MCG/ACT AEPB    Sig: Inhale 1 puff into the lungs daily.    Dispense:  28 each    Refill:  0    Order Specific  Question:   Lot Number?    Answer:   [id number]    Order Specific Question:   Expiration Date?    Answer:   10/28/2023    Order Specific Question:   Quantity    Answer:   2   albuterol (VENTOLIN HFA) 108 (90 Base) MCG/ACT inhaler    Sig: Inhale 2 puffs into the lungs every 6 (six) hours as needed.    Dispense:  8 g    Refill:  2   Fluticasone-Umeclidin-Vilant (TRELEGY ELLIPTA) 100-62.5-25 MCG/ACT AEPB    Sig: Inhale 1 puff into the lungs daily.    Dispense:  28 each    Refill:  11   We will see the patient in follow-up in 3 months time he is to contact us prior to that time should any new difficulties arise.   [name], MD Advanced Bronchoscopy [facility]    *This note was dictated using voice recognition software/Dragon.  Despite best efforts to proofread, errors can occur which can change the meaning. Any transcriptional errors that result from this process are unintentional and may not be fully corrected at the time of dictation.

## 2022-07-15 NOTE — Patient Instructions (Signed)
You have COPD which is moderate.  You will need inhalers to help you with your breathing.  We have given you a prescription for an inhaler that is a "emergency" inhaler which is puffs 4 times a day as needed only for shortness of breath or wheezing.  The other inhaler is Trelegy Ellipta this is 1 puff daily you use this inhaler whether you think you need it or not.  It is what we call a maintenance inhaler.  We are going to schedule you for formal breathing tests.  These can be done prior to your follow-up visit which will be in 3 months time call sooner should any new problems arise.

## 2022-08-10 DIAGNOSIS — H353221 Exudative age-related macular degeneration, left eye, with active choroidal neovascularization: Secondary | ICD-10-CM | POA: Diagnosis not present

## 2022-09-01 ENCOUNTER — Telehealth: Payer: Self-pay | Admitting: Pulmonary Disease

## 2022-09-01 MED ORDER — METHYLPREDNISOLONE 4 MG PO TBPK: ORAL_TABLET | ORAL | 0 refills | Status: DC

## 2022-09-01 MED ORDER — AZITHROMYCIN 250 MG PO TABS: ORAL_TABLET | ORAL | 0 refills | Status: AC

## 2022-09-01 MED ORDER — ALBUTEROL SULFATE HFA 108 (90 BASE) MCG/ACT IN AERS: 2.0000 | INHALATION_SPRAY | Freq: Four times a day (QID) | RESPIRATORY_TRACT | 2 refills | Status: DC | PRN

## 2022-09-01 NOTE — Telephone Encounter (Signed)
PT has upcoming appts but suffering w/Congestion, esp when he lays down. He wonders if we can call in Antibx or something to relive his symptoms. His # is [phone]

## 2022-09-01 NOTE — Telephone Encounter (Signed)
We can send in a Medrol Dosepak and an Azithromycin Z-Pak.  Also send in for an albuterol rescue inhaler.

## 2022-09-01 NOTE — Telephone Encounter (Signed)
Called and spoke to patient.  He stated that he developed a head cold last Friday. Cold has now moved into his chest.  C/o chest congestion, prod cough with clear sputum, wheezing and increased SOB.  Denied f/c/s or additional sx.  He is using trelegy once daily. He stated that he does not have a rescue inhaler.  Has not tested for covid or flu.  Dr. Patsey Berthold, please advise. Thanks

## 2022-09-01 NOTE — Telephone Encounter (Signed)
Patient is aware of recommendations and voiced his understanding.  Zpak, medrol and albuterol sent to preferred pharmacy. Nothing further needed.

## 2022-09-07 ENCOUNTER — Ambulatory Visit
Admission: RE | Admit: 2022-09-07 | Discharge: 2022-09-07 | Disposition: A | Discharge status: Home / Self Care | Payer: Medicare HMO | Source: Ambulatory Visit | Attending: Oncology | Admitting: Oncology

## 2022-09-07 DIAGNOSIS — C3490 Malignant neoplasm of unspecified part of unspecified bronchus or lung: Secondary | ICD-10-CM | POA: Diagnosis not present

## 2022-09-07 DIAGNOSIS — C3411 Malignant neoplasm of upper lobe, right bronchus or lung: Secondary | ICD-10-CM | POA: Diagnosis not present

## 2022-09-07 DIAGNOSIS — J90 Pleural effusion, not elsewhere classified: Secondary | ICD-10-CM | POA: Diagnosis not present

## 2022-09-17 ENCOUNTER — Ambulatory Visit: Payer: Medicare HMO | Attending: Family Medicine

## 2022-09-17 ENCOUNTER — Inpatient Hospital Stay: Payer: Medicare HMO | Attending: Oncology | Admitting: Oncology

## 2022-09-17 ENCOUNTER — Encounter: Payer: Self-pay | Admitting: Oncology

## 2022-09-17 VITALS — BP 149/69 | HR 60 | Temp 98.2°F | Resp 18 | Wt 214.0 lb

## 2022-09-17 DIAGNOSIS — Z08 Encounter for follow-up examination after completed treatment for malignant neoplasm: Secondary | ICD-10-CM | POA: Insufficient documentation

## 2022-09-17 DIAGNOSIS — Z8521 Personal history of malignant neoplasm of larynx: Secondary | ICD-10-CM | POA: Insufficient documentation

## 2022-09-17 DIAGNOSIS — J449 Chronic obstructive pulmonary disease, unspecified: Secondary | ICD-10-CM | POA: Insufficient documentation

## 2022-09-17 DIAGNOSIS — C3490 Malignant neoplasm of unspecified part of unspecified bronchus or lung: Secondary | ICD-10-CM

## 2022-09-17 DIAGNOSIS — F17210 Nicotine dependence, cigarettes, uncomplicated: Secondary | ICD-10-CM | POA: Diagnosis not present

## 2022-09-17 DIAGNOSIS — Z85118 Personal history of other malignant neoplasm of bronchus and lung: Secondary | ICD-10-CM | POA: Insufficient documentation

## 2022-09-17 DIAGNOSIS — R69 Illness, unspecified: Secondary | ICD-10-CM | POA: Diagnosis not present

## 2022-09-17 LAB — PULMONARY FUNCTION TEST ARMC ONLY
DL/VA % pred: 86 %
DL/VA: 3.43 ml/min/mmHg/L
DLCO unc % pred: 69 %
DLCO unc: 17.99 ml/min/mmHg
FEF 25-75 Post: 2.01 L/sec
FEF 25-75 Pre: 2.19 L/sec
FEF2575-%Change-Post: -7 %
FEF2575-%Pred-Post: 86 %
FEF2575-%Pred-Pre: 94 %
FEV1-%Change-Post: 0 %
FEV1-%Pred-Post: 73 %
FEV1-%Pred-Pre: 73 %
FEV1-Post: 2.35 L
FEV1-Pre: 2.33 L
FEV1FVC-%Change-Post: 4 %
FEV1FVC-%Pred-Pre: 106 %
FEV6-%Change-Post: -1 %
FEV6-%Pred-Post: 70 %
FEV6-%Pred-Pre: 71 %
FEV6-Post: 2.91 L
FEV6-Pre: 2.95 L
FEV6FVC-%Change-Post: 0 %
FEV6FVC-%Pred-Post: 106 %
FEV6FVC-%Pred-Pre: 105 %
FVC-%Change-Post: -3 %
FVC-%Pred-Post: 66 %
FVC-%Pred-Pre: 68 %
FVC-Post: 2.91 L
FVC-Pre: 3.03 L
Post FEV1/FVC ratio: 81 %
Post FEV6/FVC ratio: 100 %
Pre FEV1/FVC ratio: 77 %
Pre FEV6/FVC Ratio: 99 %
RV % pred: 65 %
RV: 1.71 L
TLC % pred: 64 %
TLC: 4.65 L

## 2022-09-17 MED ORDER — ALBUTEROL SULFATE (2.5 MG/3ML) 0.083% IN NEBU
2.5000 mg | INHALATION_SOLUTION | Freq: Once | RESPIRATORY_TRACT | Status: AC
  Administered 2022-09-17: 2.5 mg via RESPIRATORY_TRACT
  Filled 2022-09-17: qty 3

## 2022-09-17 NOTE — Progress Notes (Signed)
[place]  [phone] [phone]  ID: [name] OB: August 04, 1945  MR#: [id number]  [id number]  Patient Care Team: Birdie Sons, MD as PCP - General (Family Medicine) Kate Sable, MD as PCP - Cardiology (Cardiology) Birder Robson, MD as Referring Physician (Ophthalmology) Carloyn Manner, MD as Referring Physician (Otolaryngology) Noreene Filbert, MD as Referring Physician (Radiation Oncology) Isaias Sakai, MD as Referring Physician (Ophthalmology) Telford Nab, RN as Oncology Nurse Navigator Tyler Pita, MD as Consulting Physician (Pulmonary Disease) Valente David, RN as [place] Management  CHIEF COMPLAINT: Stage Ib adenocarcinoma of the lung.  INTERVAL HISTORY: Patient returns to clinic today for routine 71-month evaluation and discussion of his imaging results.  He continues to have occasional dyspnea on exertion, but otherwise feels well.  He has no neurologic complaints.  He denies any recent fevers or illnesses.  He has a good appetite and denies weight loss.  He has no chest pain, shortness of breath, cough, or hemoptysis.  He denies any nausea, vomiting, constipation, or diarrhea.  He has no urinary complaints.  Patient offers no further specific complaints today.  REVIEW OF SYSTEMS:   Review of Systems  Constitutional: Negative.  Negative for fever, malaise/fatigue and weight loss.  Respiratory: Negative.  Negative for cough, hemoptysis and shortness of breath.   Cardiovascular: Negative.  Negative for chest pain and leg swelling.  Gastrointestinal: Negative.  Negative for abdominal pain.  Genitourinary: Negative.  Negative for dysuria.  Musculoskeletal: Negative.  Negative for back pain.  Skin: Negative.  Negative for rash.  Neurological: Negative.  Negative for dizziness, focal weakness, weakness and headaches.  Psychiatric/Behavioral: Negative.  The patient is not nervous/anxious.      As per HPI. Otherwise, a complete review of systems is negative.  PAST MEDICAL HISTORY: Past Medical History:  Diagnosis Date   Acute metabolic encephalopathy [id number]   Anxiety    Arthritis    Atrial fibrillation ([place])    Cancer (HCC)    larynx ca, surgery and radiation   GERD (gastroesophageal reflux disease)    Heart murmur    Hernia, inguinal, bilateral    History of chicken pox    History of kidney stones    History of measles    History of mumps    History of shingles    HOH (hard of hearing)    Hypertension    Macular degeneration of both eyes    Motion sickness    ocean boat   Pneumonia due to COVID-19 virus 07/31/2020   PVC (premature ventricular contraction)     PAST SURGICAL HISTORY: Past Surgical History:  Procedure Laterality Date   CARDIAC CATHETERIZATION  2010   normal per patient report   CATARACT EXTRACTION W/PHACO Right 07/18/2019   Procedure: CATARACT EXTRACTION PHACO AND INTRAOCULAR LENS PLACEMENT ([place]) RIGHT 4.54 00:29.7;  Surgeon: Birder Robson, MD;  Location: [place];  Service: Ophthalmology;  Laterality: Right;   CATARACT EXTRACTION W/PHACO Left 08/08/2019   Procedure: CATARACT EXTRACTION PHACO AND INTRAOCULAR LENS PLACEMENT ([place]) LEFT;  Surgeon: Birder Robson, MD;  Location: [facility];  Service: Ophthalmology;  Laterality: Left;  Lot [id number] H [place]: 00:34.5 CDE: 4.75   COLONOSCOPY  06/24/2006   Dr. Bary Castilla. Multiple benign appearing 38mm polyps in the cecum and in the rectum. -Three 28mm polyps in the transversed colon, in the tranverse colon, proximal and in the distal transverse colon. Resected and retrieved.   COLONOSCOPY WITH PROPOFOL N/A 03/12/2020   Procedure: COLONOSCOPY WITH PROPOFOL;  Surgeon: Lucilla Lame, MD;  Location: [facility] END[facility];  Service: Endoscopy;  Laterality: N/A;   CYSTOSCOPY WITH HOLMIUM LASER LITHOTRIPSY  2001   kidney stones removed, [facility]   HERNIA REPAIR     umbilical and bilateral inguninal    INTERCOSTAL NERVE BLOCK  05/28/2022   Procedure: INTERCOSTAL NERVE BLOCK;  Surgeon: Melrose Nakayama, MD;  Location: [place];  Service: Thoracic;;   LARYNGOSCOPY Right 04/01/2017   Procedure: SUSPENSION LARYNGOSCOPY WITH MICROFLAP EXCISION;  Surgeon: Carloyn Manner, MD;  Location: [facility];  Service: ENT;  Laterality: Right;   LYMPH NODE DISSECTION  05/28/2022   Procedure: LYMPH NODE DISSECTION;  Surgeon: Melrose Nakayama, MD;  Location: [place];  Service: Thoracic;;   sinus surgery   2002   [place]; removal of polyps   TONSILLECTOMY  1954   Tubular adenoma removed  06/24/2006    FAMILY HISTORY: Family History  Problem Relation Age of Onset   Lung cancer Mother    Heart disease Father     ADVANCED DIRECTIVES (Y/N):  N  HEALTH MAINTENANCE: Social History   Tobacco Use   Smoking status: Some Days    Packs/day: 1.00    Years: 50.00    Additional pack years: 0.00    Total pack years: 50.00    Types: Cigarettes    Start date: 1973   Smokeless tobacco: Never   Tobacco comments:    Smokes 1-2 cigarettes some days- 07/15/2022  Vaping Use   Vaping Use: Never used  Substance Use Topics   Alcohol use: Not Currently    Alcohol/week: 0.0 standard drinks of alcohol   Drug use: No     Colonoscopy:  PAP:  Bone density:  Lipid panel:  Allergies  Allergen Reactions   Penicillins Anaphylaxis and Swelling    Did it involve swelling of the face/tongue/throat, SOB, or low BP? Yes Did it involve sudden or severe rash/hives, skin peeling, or any reaction on the inside of your mouth or nose? No Did you need to seek medical attention at a hospital or doctor's office? No When did it last happen?      10 + years ago If all above answers are "NO", may proceed with cephalosporin use.    Atorvastatin Other (See Comments)    myalgia   Trazodone And Nefazodone     Caused fainting spells    Current Outpatient Medications  Medication Sig Dispense Refill   albuterol (VENTOLIN  HFA) 108 (90 Base) MCG/ACT inhaler Inhale 2 puffs into the lungs every 6 (six) hours as needed. 18 g 2   aspirin 81 MG tablet Take 81 mg by mouth daily.     buPROPion (WELLBUTRIN SR) 150 MG 12 hr tablet Take 1 tablet (150 mg total) by mouth 2 (two) times daily. 180 tablet 2   cycloSPORINE (RESTASIS) 0.05 % ophthalmic emulsion Place 1 drop into both eyes daily as needed (dry eyes).     diltiazem (CARDIZEM CD) 120 MG 24 hr capsule Take 1 capsule (120 mg total) by mouth daily. 90 capsule 3   fluticasone (FLONASE) 50 MCG/ACT nasal spray Place 2 sprays into both nostrils every morning.      Fluticasone-Umeclidin-Vilant (TRELEGY ELLIPTA) 100-62.5-25 MCG/ACT AEPB Inhale 1 puff into the lungs daily. 28 each 0   Fluticasone-Umeclidin-Vilant (TRELEGY ELLIPTA) 100-62.5-25 MCG/ACT AEPB Inhale 1 puff into the lungs daily. 28 each 11   Multiple Vitamins-Minerals (PRESERVISION AREDS 2 PO) Take 2 capsules by mouth every morning.      omeprazole (PRILOSEC)  20 MG capsule Take 20 mg by mouth daily as needed (acid reflux).     gabapentin (NEURONTIN) 300 MG capsule Take 1 capsule (300 mg total) by mouth 2 (two) times daily. (Patient not taking: Reported on 07/15/2022) 30 capsule 1   methylPREDNISolone (MEDROL DOSEPAK) 4 MG TBPK tablet Use as directed. (Patient not taking: Reported on 09/17/2022) 21 each 0   pravastatin (PRAVACHOL) 20 MG tablet Take 1 tablet (20 mg total) by mouth every evening. (Patient not taking: Reported on 07/15/2022) 30 tablet 3   ramelteon (ROZEREM) 8 MG tablet Take 1 tablet (8 mg total) by mouth at bedtime. (Patient not taking: Reported on 07/15/2022) 30 tablet 2   sertraline (ZOLOFT) 100 MG tablet TAKE 1 TABLET BY MOUTH EVERY DAY (Patient not taking: Reported on 07/15/2022) 90 tablet 0   tamsulosin (FLOMAX) 0.4 MG CAPS capsule Take 1 capsule (0.4 mg total) by mouth daily after supper. (Patient not taking: Reported on 09/17/2022) 30 capsule 1   No current facility-administered medications for this  visit.    OBJECTIVE: Vitals:   09/17/22 1045  BP: (!) 149/69  Pulse: 60  Resp: 18  Temp: 98.2 F (36.8 C)  SpO2: 99%     Body mass index is 29.85 kg/m.    ECOG FS:0 - Asymptomatic  General: Well-developed, well-nourished, no acute distress. Eyes: Pink conjunctiva, anicteric sclera. HEENT: Normocephalic, moist mucous membranes. Lungs: No audible wheezing or coughing. Heart: Regular rate and rhythm. Abdomen: Soft, nontender, no obvious distention. Musculoskeletal: No edema, cyanosis, or clubbing. Neuro: Alert, answering all questions appropriately. Cranial nerves grossly intact. Skin: No rashes or petechiae noted. Psych: Normal affect.  LAB RESULTS:  Lab Results  Component Value Date   NA 141 07/03/2022   K 3.8 07/03/2022   CL 102 07/03/2022   CO2 24 07/03/2022   GLUCOSE 173 (H) 07/03/2022   BUN 17 07/03/2022   CREATININE 1.25 07/03/2022   CALCIUM 8.7 07/03/2022   PROT 6.6 07/03/2022   ALBUMIN 4.4 07/03/2022   AST 23 07/03/2022   ALT 18 07/03/2022   ALKPHOS 62 07/03/2022   BILITOT <0.2 07/03/2022   GFRNONAA >60 06/24/2022   GFRAA 86 08/07/2020    Lab Results  Component Value Date   WBC 7.9 07/03/2022   NEUTROABS 5.3 07/07/2020   HGB 14.4 07/03/2022   HCT 43.1 07/03/2022   MCV 92 07/03/2022   PLT 272 07/03/2022     STUDIES: CT Chest Wo Contrast  Result Date: 09/07/2022 CLINICAL DATA:  Stage I non-small cell right upper lobe lung cancer status post right upper lobectomy 05/28/2022. Restaging. * Tracking Code: BO * EXAM: CT CHEST WITHOUT CONTRAST TECHNIQUE: Multidetector CT imaging of the chest was performed following the standard protocol without IV contrast. RADIATION DOSE REDUCTION: This exam was performed according to the departmental dose-optimization program which includes automated exposure control, adjustment of the mA and/or kV according to patient size and/or use of iterative reconstruction technique. COMPARISON:  06/23/2022 chest CT. FINDINGS:  Cardiovascular: Normal heart size. No significant pericardial effusion/thickening. Three-vessel coronary atherosclerosis. Atherosclerotic nonaneurysmal thoracic aorta. Normal caliber pulmonary arteries. Mediastinum/Nodes: No significant thyroid nodules. Unremarkable esophagus. No pathologically enlarged axillary, mediastinal or hilar lymph nodes, noting limited sensitivity for the detection of hilar adenopathy on this noncontrast study. Lungs/Pleura: No pneumothorax. Small dependent right pleural effusion, slightly decreased. No left pleural effusion. Status post right upper lobectomy. Suture line noted in the posterior right middle lobe. Solid 0.6 cm posterior right lower lobe pulmonary nodule (series 3/image 90), stable since  baseline 03/15/2022 chest CT. No acute consolidative airspace disease, lung masses or new significant pulmonary nodules. Upper abdomen: No acute abnormality. Musculoskeletal: No aggressive appearing focal osseous lesions. Mild thoracic spondylosis. IMPRESSION: 1. No evidence of local tumor recurrence status post right upper lobectomy. 2. No findings suspicious for metastatic disease in the chest. Right lower lobe 0.6 cm solid pulmonary nodule is stable since baseline 03/15/2022 chest CT and warrants continued chest CT follow-up. 3. Small dependent right pleural effusion, slightly decreased. 4. Three-vessel coronary atherosclerosis. 5.  Aortic Atherosclerosis (ICD10-I70.0). Electronically Signed   By: Ilona Sorrel M.D.   On: 09/07/2022 15:18    ASSESSMENT: Stage Ib adenocarcinoma of the lung.  PLAN:    Stage Ib adenocarcinoma of the lung: Patient underwent surgical resection on May 28, 2022.  Patient has slight invasion of pleura, but no metastatic disease noted in lymph nodes.  He did not require adjuvant XRT or chemotherapy.  CT scan results from September 06, 2021 reviewed independently and report as above with no obvious evidence of recurrent or progressive disease.  No intervention  is needed at this time.  Return to clinic in 6 months with repeat imaging and further evaluation.   Laryngeal cancer: Patient diagnosed with a stage I squamous cell carcinoma in 2019 that was treated with XRT only.  Patient reports his most recent endoscopy revealed no evidence of disease. Cardiac disease: Continue close follow-up with cardiology.  I spent a total of 20 minutes reviewing chart data, face-to-face evaluation with the patient, counseling and coordination of care as detailed above.   Patient expressed understanding and was in agreement with this plan. He also understands that He can call clinic at any time with any questions, concerns, or complaints.    Cancer Staging  Lung cancer [facility]) Staging form: Lung, AJCC 8th Edition - Clinical stage from 06/11/2022: Stage IB (cT2a, cN0, cM0) - Signed by Lloyd Huger, MD on 06/11/2022 Stage prefix: Initial diagnosis   Lloyd Huger, MD   09/17/2022 11:12 AM

## 2022-09-17 NOTE — Progress Notes (Signed)
No new concerns today 

## 2022-09-18 ENCOUNTER — Ambulatory Visit: Payer: Self-pay | Admitting: *Deleted

## 2022-09-18 NOTE — Patient Outreach (Signed)
  Care Coordination   Follow Up Visit Note   09/18/2022 Name: Jeffrey Torres MRN: [id number] DOB: 01-04-1946  Jeffrey Torres is a 77 y.o. year old male who sees Fisher, Kirstie Peri, MD for primary care. I spoke with  Jeffrey Torres by phone today.  What matters to the patients health and wellness today?  Completing course of antibiotics for recent cough and congestion.  Also state blood pressure has been elevated, will start monitoring.  Concerned that his wrist cuff is not accurate, will have office nurse look at it and notify this RNCM if he needs a new machine.     Goals Addressed             This Visit's Progress    Adjust to New diagnosis of lung cancer   On track    Care Coordination Interventions: Assessed patient understanding of cancer diagnosis and recommended treatment plan Reviewed upcoming provider appointments and treatment appointments Assessed available transportation to appointments and treatments. Has consistent/reliable transportation: Yes Assessed support system. Has consistent/reliable family or other support: Yes Appointment for CT went well, no further issues with cancer, will have 6 month follow up.  Was seen by pulmonary yesterday for cough/congestion, given antibiotics and will follow up on 3/28.  PCP office will call to schedule 6 month follow up, last seen on 10/25          SDOH assessments and interventions completed:  No     Care Coordination Interventions:  Yes, provided   Interventions Today    Flowsheet Row Most Recent Value  Chronic Disease   Chronic disease during today's visit Other  [lung cancer]  General Interventions   General Interventions Discussed/Reviewed General Interventions Reviewed, Doctor Visits  Doctor Visits Discussed/Reviewed Doctor Visits Reviewed, Specialist  PCP/Specialist Visits Compliance with follow-up visit  Education Interventions   Education Provided Provided Education  Provided Verbal Education On When to see the  doctor, Other  [blood pressure]       Follow up plan: Follow up call scheduled for 3/29    Encounter Outcome:  Pt. Visit Completed   Jeffrey David, RN, MSN, [place] Care Management Care Management Coordinator [phone]

## 2022-09-18 NOTE — Patient Instructions (Signed)
Visit Information  Thank you for taking time to visit with me today. Please don't hesitate to contact me if I can be of assistance to you before our next scheduled telephone appointment.  Following are the goals we discussed today:  Have office look at wrist machine, if not working, let this care manager know or purchase new one.   Our next appointment is by telephone on 3/29  Please call the care guide team at [phone] if you need to cancel or reschedule your appointment.   Please call the Suicide and Crisis Lifeline: 988 call the [place] National Suicide Prevention Lifeline: 8140120797 or TTY: 684-320-2586 TTY (434) 749-0377) to talk to a trained counselor call 1-800-273-TALK (toll free, 24 hour hotline) call 911 if you are experiencing a Mental Health or [place] or need someone to talk to.  Patient verbalizes understanding of instructions and care plan provided today and agrees to view in [place]. Active MyChart status and patient understanding of how to access instructions and care plan via MyChart confirmed with patient.     The patient has been provided with contact information for the care management team and has been advised to call with any health related questions or concerns.   Valente David, RN, MSN, [place] Care Management Care Management Coordinator [phone]

## 2022-09-24 ENCOUNTER — Encounter: Payer: Self-pay | Admitting: Pulmonary Disease

## 2022-09-24 ENCOUNTER — Ambulatory Visit: Payer: Medicare HMO | Admitting: Pulmonary Disease

## 2022-09-24 VITALS — BP 120/60 | HR 64 | Temp 97.9°F | Ht 71.0 in | Wt 213.4 lb

## 2022-09-24 DIAGNOSIS — F17210 Nicotine dependence, cigarettes, uncomplicated: Secondary | ICD-10-CM | POA: Diagnosis not present

## 2022-09-24 DIAGNOSIS — C3491 Malignant neoplasm of unspecified part of right bronchus or lung: Secondary | ICD-10-CM | POA: Diagnosis not present

## 2022-09-24 DIAGNOSIS — J449 Chronic obstructive pulmonary disease, unspecified: Secondary | ICD-10-CM | POA: Diagnosis not present

## 2022-09-24 DIAGNOSIS — R69 Illness, unspecified: Secondary | ICD-10-CM | POA: Diagnosis not present

## 2022-09-24 NOTE — Progress Notes (Signed)
Subjective:    Patient ID: ABDURRAHIM GOZMAN, male    DOB: [birth year], 77 y.o.   MRN: [id number] Patient Care Team: Birdie Sons, MD as PCP - General (Family Medicine) Kate Sable, MD as PCP - Cardiology (Cardiology) Birder Robson, MD as Referring Physician (Ophthalmology) Carloyn Manner, MD as Referring Physician (Otolaryngology) Noreene Filbert, MD as Referring Physician (Radiation Oncology) Isaias Sakai, MD as Referring Physician (Ophthalmology) Telford Nab, RN as Oncology Nurse Navigator Tyler Pita, MD as Consulting Physician (Pulmonary Disease) Valente David, RN as [place] Management  Chief Complaint  Patient presents with   Follow-up    COPD    HPI Patient is a 77 year old occasional smoker (44 PY) who presents for follow-up on the issue of right upper lobe non-small cell cancer of the lung status post lobectomy on 28 May 2022 by Dr. Merilynn Finland. Pathology showed poorly differentiated carcinoma more consistent with a poorly differentiated squamous cell carcinoma.  Patient was last seen here on 15 July 2022 at that time patient had issues with dyspnea on exertion.  He had spirometry performed that showed severe obstruction.  He was started on Trelegy Ellipta 100 and full pulmonary function was ordered.  PFTs showed marked improvement on his FEV1 from 1.46 L (46%) noted on spirometry to 2.33 L on formal PFT (73%).  PFT of course done after he had been on Trelegy Ellipta.  These are consistent with restrictive and obstructive physiology and a "canceling out effect.  Restrictive physiology likely due to prior lobectomy.  Mild diffusion capacity impairment which corrects to alveolar volume.  He did require therapy with Medrol Dosepak and azithromycin on 01 September 2022 due to upper respiratory infection/illness.  Patient states that he responded really well to this and also that the Trelegy helped him during this time.  Presents today  for follow-up he notes that he is doing well.  He has not no fevers, chills or sweats since his illness.  No cough or sputum production.  He does not endorse any weight loss or anorexia.  No chest pain, no orthopnea paroxysmal nocturnal dyspnea nor lower extremity edema.  No calf tenderness.  Patient does not endorse any other symptomatology today.  Overall he feels well and looks well.  Review of Systems A 10 point review of systems was performed and it is as noted above otherwise negative.  Patient Active Problem List   Diagnosis Date Noted   Elevated serum creatinine 07/03/2022   Prediabetes 07/03/2022   Anemia 07/03/2022   Syncope and collapse 06/23/2022   PAF (paroxysmal atrial fibrillation) ([place]) 06/23/2022   Dyslipidemia 06/23/2022   Chronic kidney disease, stage 3a ([place]) 06/23/2022   Lung cancer ([place]) 05/28/2022   Depression 04/22/2022   Right upper lobe pulmonary nodule 03/20/2022   Dizziness 03/14/2022   Hypokalemia 03/14/2022   Hypernatremia 07/31/2020   History of colonic polyps    Severe obstructive sleep apnea 02/21/2020   Primary hypertension 08/14/2019   Atrial bigeminy 08/14/2019   History of laryngeal cancer 12/28/2017   History of adenomatous polyp of colon 10/30/2015   Inguinal hernia, bilateral 10/21/2015   PVC (premature ventricular contraction) 10/21/2015   Tobacco dependence [id number]   Umbilical hernia [id number]   Social History   Tobacco Use   Smoking status: Some Days    Packs/day: 1.00    Years: 50.00    Additional pack years: 0.00    Total pack years: 50.00    Types: Cigarettes    Start date:  1973   Smokeless tobacco: Never   Tobacco comments:    Smokes 1-2 cigarettes some days- 09/24/2022  Substance Use Topics   Alcohol use: Not Currently    Alcohol/week: 0.0 standard drinks of alcohol   Allergies  Allergen Reactions   Penicillins Anaphylaxis and Swelling    Did it involve swelling of the face/tongue/throat, SOB, or low BP? Yes Did  it involve sudden or severe rash/hives, skin peeling, or any reaction on the inside of your mouth or nose? No Did you need to seek medical attention at a hospital or doctor's office? No When did it last happen?      10 + years ago If all above answers are "NO", may proceed with cephalosporin use.    Atorvastatin Other (See Comments)    myalgia   Trazodone And Nefazodone     Caused fainting spells   Current Meds  Medication Sig   albuterol (VENTOLIN HFA) 108 (90 Base) MCG/ACT inhaler Inhale 2 puffs into the lungs every 6 (six) hours as needed.   aspirin 81 MG tablet Take 81 mg by mouth daily.   buPROPion (WELLBUTRIN SR) 150 MG 12 hr tablet Take 1 tablet (150 mg total) by mouth 2 (two) times daily.   cycloSPORINE (RESTASIS) 0.05 % ophthalmic emulsion Place 1 drop into both eyes daily as needed (dry eyes).   diltiazem (CARDIZEM CD) 120 MG 24 hr capsule Take 1 capsule (120 mg total) by mouth daily.   fluticasone (FLONASE) 50 MCG/ACT nasal spray Place 2 sprays into both nostrils every morning.    Fluticasone-Umeclidin-Vilant (TRELEGY ELLIPTA) 100-62.5-25 MCG/ACT AEPB Inhale 1 puff into the lungs daily.   Multiple Vitamins-Minerals (PRESERVISION AREDS 2 PO) Take 2 capsules by mouth every morning.    omeprazole (PRILOSEC) 20 MG capsule Take 20 mg by mouth daily as needed (acid reflux).   Immunization History  Administered Date(s) Administered   Fluad Quad(high Dose 65+) 08/07/2020, 03/18/2021, 03/17/2022   Influenza, High Dose Seasonal PF 03/25/2017, 03/29/2018, 04/24/2019   Pneumococcal Conjugate-13 04/26/2014   Pneumococcal Polysaccharide-23 05/28/2012       Objective:   Physical Exam BP 120/60 (BP Location: Left Arm, Cuff Size: Normal)   Pulse 64   Temp 97.9 F (36.6 C) (Temporal)   Ht 5\' 11"  (1.803 m)   Wt 213 lb 6.4 oz (96.8 kg)   SpO2 99%   BMI 29.76 kg/m   SpO2: 99 % O2 Device: None (Room air)  GENERAL: Well-developed, overweight gentleman in no acute distress, fully  ambulatory, no conversational dyspnea. Fluent speech. HEAD: Normocephalic, atraumatic.  EYES: Pupils equal, round, reactive to light.  No scleral icterus.  MOUTH: Poor dentition, oral mucosa moist.  No thrush. NECK: Supple. No thyromegaly. Trachea midline. No JVD.  No adenopathy. PULMONARY: Symmetrical lung sounds good air entry bilaterally.  No adventitious sounds.  CARDIOVASCULAR: S1 and S2.  Rate and rhythm.  No rubs, murmurs or gallops heard. ABDOMEN: Benign. MUSCULOSKELETAL: No joint deformity, no clubbing, no edema.  NEUROLOGIC: No overt focal deficit, no gait disturbance, speech is fluent. SKIN: Intact,warm,dry. PSYCH: Mood and behavior normal     Assessment & Plan:     ICD-10-CM   1. Stage 2 moderate COPD by GOLD classification ([place])  J44.9    Has significant bronchodilator response BD response: difference between spirometry/PFTs Restrictive physiology due to prior lobectomy Continue Trelegy    2. Non-small cell cancer of right lung [facility])  C34.91    This issue adds complexity to his management Follows with oncology  Dr. [name]    3. Tobacco dependence due to cigarettes  F17.210    Patient was counseled regards to discontinuation of smoking Total counseling time 3 to 5 minutes     Patient is to continue current medications.  He appears well compensated today.  Will see him in follow-up in 4 months time he is to call sooner should any new problems arise.  [name], MD Advanced Bronchoscopy PCCM [facility]    *This note was dictated using voice recognition software/Dragon.  Despite best efforts to proofread, errors can occur which can change the meaning. Any transcriptional errors that result from this process are unintentional and may not be fully corrected at the time of dictation.

## 2022-09-24 NOTE — Patient Instructions (Addendum)
Your lungs sounded very clear today.  Continue your efforts to quit smoking.  We will continue the inhaler for now, you still have refills in the pharmacy.  We will see you in follow-up in 4 months time call sooner should any new problems arise.

## 2022-09-25 ENCOUNTER — Ambulatory Visit: Payer: Self-pay | Admitting: *Deleted

## 2022-09-25 NOTE — Patient Outreach (Signed)
  Care Coordination   Follow Up Visit Note   09/25/2022 Name: Jeffrey Torres MRN: [id number] DOB: [birth year]  Jeffrey Torres is a 77 y.o. year old male who sees Fisher, Kirstie Peri, MD for primary care. I spoke with  [name] by phone today.  What matters to the patients health and wellness today?  Keeping blood pressure under control.    Goals Addressed             This Visit's Progress    Adjust to New diagnosis of lung cancer   On track    Care Coordination Interventions: Assessed patient understanding of cancer diagnosis and recommended treatment plan Reviewed upcoming provider appointments and treatment appointments Assessed available transportation to appointments and treatments. Has consistent/reliable transportation: Yes Assessed support system. Has consistent/reliable family or other support: Yes Appointment for CT went well, no further issues with cancer, will have 6 month follow up.  Was seen by pulmonary yesterday for cough/congestion, given antibiotics and will follow up on 3/28.  PCP office will call to schedule 6 month follow up, last seen on 10/25       Effective management of HTN       Care Coordination Interventions: Evaluation of current treatment plan related to hypertension self management and patient's adherence to plan as established by provider Provided assistance with obtaining home blood pressure monitor via [facility] - requested administrative assisted to send omonitor to patient ; Reviewed scheduled/upcoming provider appointments including:  Discussed continuing to hold antihypertensives according to most recent discharge instructions until he is able to speak with provider.  Advised to monitor blood pressure, record readings and share with provider during next visit. Cardiology on 5/10 and PCP on 5/28         SDOH assessments and interventions completed:  No     Care Coordination Interventions:  Yes, provided   Interventions Today    Flowsheet  Row Most Recent Value  Chronic Disease   Chronic disease during today's visit Hypertension (HTN)  General Interventions   General Interventions Discussed/Reviewed General Interventions Reviewed, Doctor Visits, Communication with  Doctor Visits Discussed/Reviewed Doctor Visits Reviewed, PCP, Specialist  PCP/Specialist Visits Compliance with follow-up visit  Communication with --  [Admin assistant]  Education Interventions   Education Provided Provided Education  Provided Verbal Education On When to see the doctor, Other  [BP monitoring]        Follow up plan: Follow up call scheduled for 4/10    Encounter Outcome:  Pt. Visit Completed   Valente David, RN, MSN, [facility] Care Management Coordinator [phone]

## 2022-09-28 ENCOUNTER — Other Ambulatory Visit: Payer: Self-pay

## 2022-09-28 ENCOUNTER — Other Ambulatory Visit: Payer: Self-pay | Admitting: Family Medicine

## 2022-09-28 MED ORDER — DILTIAZEM HCL ER COATED BEADS 120 MG PO CP24: 120.0000 mg | ORAL_CAPSULE | Freq: Every day | ORAL | 1 refills | Status: DC

## 2022-09-29 ENCOUNTER — Telehealth: Payer: Self-pay | Admitting: Pulmonary Disease

## 2022-09-29 MED ORDER — ALBUTEROL SULFATE HFA 108 (90 BASE) MCG/ACT IN AERS: 2.0000 | INHALATION_SPRAY | Freq: Four times a day (QID) | RESPIRATORY_TRACT | 2 refills | Status: AC | PRN

## 2022-09-29 NOTE — Telephone Encounter (Signed)
Pt states he needs refills of his inhaler and the rescue inhaler sent in to [facility] on [address]

## 2022-09-29 NOTE — Telephone Encounter (Signed)
I spoke with the patient. He needs a refill on Trelegy and Albuterol. I told him he has 11 refills on his Trelegy at his pharmacy and he needs to contact them to get it refilled. I did go ahead and refill his albuterol and notified the patient.  Nothing further needed.

## 2022-09-29 NOTE — Telephone Encounter (Signed)
ATC patient. LVM for patient to return my call. 

## 2022-10-05 ENCOUNTER — Telehealth: Payer: Self-pay | Admitting: Family Medicine

## 2022-10-05 NOTE — Telephone Encounter (Signed)
Called patient to schedule Medicare Annual Wellness Visit (AWV). Left message for patient to call back and schedule Medicare Annual Wellness Visit (AWV).  Last date of AWV: 11/18/2021  Please schedule an appointment at any time with [facility].  If any questions, please contact me at [phone].  Thank you ,  Bernice Cicero Care Guide [facility] AWV TEAM Direct Dial: [phone]   

## 2022-10-07 ENCOUNTER — Ambulatory Visit: Payer: Self-pay | Admitting: *Deleted

## 2022-10-07 NOTE — Patient Outreach (Signed)
  Care Coordination   Follow Up Visit Note   10/07/2022 Name: Jeffrey Torres MRN: [id number] DOB: 10-13-1945  GOERGE LOBODA is a 77 y.o. year old male who sees Fisher, Demetrios Isaacs, MD for primary care. I spoke with  [name] by phone today.  What matters to the patients health and wellness today?  Keeping blood pressure controlled.    Goals Addressed             This Visit's Progress    Effective management of HTN   On track    Care Coordination Interventions: Evaluation of current treatment plan related to hypertension self management and patient's adherence to plan as established by provider Provided assistance with obtaining home blood pressure monitor via confirmed machine has been received; Reviewed scheduled/upcoming provider appointments including:  Advised to monitor blood pressure, record readings and share with provider during next visit. Cardiology on 5/10 and PCP on 5/28.  Advised to call to schedule AWV. Discussed medications, state he has restarted antihypertensives         SDOH assessments and interventions completed:  No     Care Coordination Interventions:  Yes, provided   Interventions Today    Flowsheet Row Most Recent Value  Chronic Disease   Chronic disease during today's visit Hypertension (HTN)  General Interventions   General Interventions Discussed/Reviewed General Interventions Reviewed, Doctor Visits  Doctor Visits Discussed/Reviewed Doctor Visits Reviewed, Annual Wellness Visits  PCP/Specialist Visits Compliance with follow-up visit  Education Interventions   Provided Verbal Education On When to see the doctor       Follow up plan: Follow up call scheduled for 5/30    Encounter Outcome:  Pt. Visit Completed   Kemper Durie, RN, MSN, [facility] Care Management Care Management Coordinator [phone]

## 2022-10-29 DIAGNOSIS — H02831 Dermatochalasis of right upper eyelid: Secondary | ICD-10-CM | POA: Diagnosis not present

## 2022-10-29 DIAGNOSIS — H353221 Exudative age-related macular degeneration, left eye, with active choroidal neovascularization: Secondary | ICD-10-CM | POA: Diagnosis not present

## 2022-10-29 DIAGNOSIS — H353190 Nonexudative age-related macular degeneration, unspecified eye, stage unspecified: Secondary | ICD-10-CM | POA: Diagnosis not present

## 2022-11-06 ENCOUNTER — Ambulatory Visit: Payer: Medicare HMO | Attending: Cardiology | Admitting: Cardiology

## 2022-11-06 ENCOUNTER — Encounter: Payer: Self-pay | Admitting: Cardiology

## 2022-11-06 VITALS — BP 134/70 | HR 60 | Ht 71.0 in | Wt 209.4 lb

## 2022-11-06 DIAGNOSIS — I10 Essential (primary) hypertension: Secondary | ICD-10-CM

## 2022-11-06 DIAGNOSIS — I2510 Atherosclerotic heart disease of native coronary artery without angina pectoris: Secondary | ICD-10-CM | POA: Diagnosis not present

## 2022-11-06 NOTE — Patient Instructions (Signed)
Medication Instructions:   STOP HCTZ STOP Potassium START Losartan - Take one tablet (50mg ) by mouth daily.   *If you need a refill on your cardiac medications before your next appointment, please call your pharmacy*   Lab Work:  Your physician recommends that you return for lab work in: 2 weeks at the medical mall.  No appt is needed. Hours are M-F 7AM- 6 PM.  If you have labs (blood work) drawn today and your tests are completely normal, you will receive your results only by: MyChart Message (if you have MyChart) OR A paper copy in the mail If you have any lab test that is abnormal or we need to change your treatment, we will call you to review the results.   Testing/Procedures:  2. None Order    Follow-Up: At [facility], you and your health needs are our priority.  As part of our continuing mission to provide you with exceptional heart care, we have created designated Provider Care Teams.  These Care Teams include your primary Cardiologist (physician) and Advanced Practice Providers (APPs -  Physician Assistants and Nurse Practitioners) who all work together to provide you with the care you need, when you need it.  We recommend signing up for the patient portal called "MyChart".  Sign up information is provided on this After Visit Summary.  MyChart is used to connect with patients for Virtual Visits (Telemedicine).  Patients are able to view lab/test results, encounter notes, upcoming appointments, etc.  Non-urgent messages can be sent to your provider as well.   To learn more about what you can do with MyChart, go to [email or web address].    Your next appointment:   2 month(s)  Provider:   You may see Debbe Odea, MD or one of the following Advanced Practice Providers on your designated Care Team:   Nicolasa Ducking, NP Eula Listen, PA-C Cadence Fransico Michael, PA-C Charlsie Quest, NP

## 2022-11-06 NOTE — Progress Notes (Signed)
Cardiology Office Note:    Date:  11/06/2022   ID:  Jeffrey Torres, DOB July 13, 1945, MRN [id number]  PCP:  Malva Limes, MD  [facility] Cardiologist:  Debbe Odea, MD  [facility] Electrophysiologist:  None   Referring MD: Malva Limes, MD   Chief Complaint  Patient presents with   Follow-up    Patient denies new or acute cardiac problems/concerns today.  Patient has been prescribed Potassium 20 meq QD and HCTZ 25mg  QD since last visit.    History of Present Illness:    Jeffrey Torres is a 77 y.o. male with a hx of CAD (LAD and RCA calcifications on chest CT 10/23), hypertension, OSA, former smoker x35+ years, COPD, non-small cell right lung cancer s/p surgical resection who presents for follow-up.    Being seen for coronary calcifications, echocardiogram and Lexiscan Myoview showed no significant abnormalities.  Denies chest pain, tolerating aspirin, Pravachol.  Underwent a resection for his lung cancer, did not require chemo or radiation therapy.  Follows up with oncology.  Had an episode of dehydration and hypokalemia several months ago.  HCTZ was stopped.  Recently restarted.  Doing okay, no new concerns.  Prior notes.   Lexiscan Myoview 10/23, low risk study, no evidence of ischemia Echo 02/2022 EF 60 to 65%, aortic valve sclerosis Patient on Cardizem due to history of PACs, PVCs, atrial bigeminy and hypertension. diagnosed with OSA, CPAP mask recommended but patient does not like to wear a mask.  He will try to lose weight to see if this will help with his sleep apnea.     Past Medical History:  Diagnosis Date   Acute metabolic encephalopathy 07/31/2020   Anxiety    Arthritis    Atrial fibrillation (HCC)    Cancer (HCC)    larynx ca, surgery and radiation   GERD (gastroesophageal reflux disease)    Heart murmur    Hernia, inguinal, bilateral    History of chicken pox    History of kidney stones    History of measles    History of mumps    History  of shingles    HOH (hard of hearing)    Hypertension    Macular degeneration of both eyes    Motion sickness    ocean boat   Pneumonia due to COVID-19 virus 07/31/2020   PVC (premature ventricular contraction)     Past Surgical History:  Procedure Laterality Date   CARDIAC CATHETERIZATION  2010   normal per patient report   CATARACT EXTRACTION W/PHACO Right 07/18/2019   Procedure: CATARACT EXTRACTION PHACO AND INTRAOCULAR LENS PLACEMENT (IOC) RIGHT 4.54 00:29.7;  Surgeon: Galen Manila, MD;  Location: [facility];  Service: Ophthalmology;  Laterality: Right;   CATARACT EXTRACTION W/PHACO Left 08/08/2019   Procedure: CATARACT EXTRACTION PHACO AND INTRAOCULAR LENS PLACEMENT (IOC) LEFT;  Surgeon: Galen Manila, MD;  Location: [facility] ORS;  Service: Ophthalmology;  Laterality: Left;  Lot #[id number] H [place]: 00:34.5 CDE: 4.75   COLONOSCOPY  06/24/2006   Dr. Lemar Livings. Multiple benign appearing 5mm polyps in the cecum and in the rectum. -Three 8mm polyps in the transversed colon, in the tranverse colon, proximal and in the distal transverse colon. Resected and retrieved.   COLONOSCOPY WITH PROPOFOL N/A 03/12/2020   Procedure: COLONOSCOPY WITH PROPOFOL;  Surgeon: Midge Minium, MD;  Location: [facility] ENDOSCOPY;  Service: Endoscopy;  Laterality: N/A;   CYSTOSCOPY WITH HOLMIUM LASER LITHOTRIPSY  2001   kidney stones removed, [facility]   HERNIA REPAIR  umbilical and bilateral inguninal   INTERCOSTAL NERVE BLOCK  05/28/2022   Procedure: INTERCOSTAL NERVE BLOCK;  Surgeon: Loreli Slot, MD;  Location: [facility] OR;  Service: Thoracic;;   LARYNGOSCOPY Right 04/01/2017   Procedure: SUSPENSION LARYNGOSCOPY WITH MICROFLAP EXCISION;  Surgeon: Bud Face, MD;  Location: [facility] ORS;  Service: ENT;  Laterality: Right;   LYMPH NODE DISSECTION  05/28/2022   Procedure: LYMPH NODE DISSECTION;  Surgeon: Loreli Slot, MD;  Location: [facility] OR;  Service: Thoracic;;   sinus surgery   2002   [facility]; removal of polyps   TONSILLECTOMY  1954   Tubular adenoma removed  06/24/2006    Current Medications: Current Meds  Medication Sig   albuterol (VENTOLIN HFA) 108 (90 Base) MCG/ACT inhaler Inhale 2 puffs into the lungs every 6 (six) hours as needed.   aspirin 81 MG tablet Take 81 mg by mouth daily.   cycloSPORINE (RESTASIS) 0.05 % ophthalmic emulsion Place 1 drop into both eyes daily as needed (dry eyes).   diltiazem (CARDIZEM CD) 120 MG 24 hr capsule Take 1 capsule (120 mg total) by mouth daily.   hydrochlorothiazide (HYDRODIURIL) 25 MG tablet Take 25 mg by mouth daily.   KLOR-CON M20 20 MEQ tablet Take 20 mEq by mouth daily.   Multiple Vitamins-Minerals (PRESERVISION AREDS 2 PO) Take 2 capsules by mouth every morning.    omeprazole (PRILOSEC) 20 MG capsule Take 20 mg by mouth daily as needed (acid reflux).   sertraline (ZOLOFT) 100 MG tablet TAKE 1 TABLET BY MOUTH EVERY DAY     Allergies:   Penicillins, Atorvastatin, and Trazodone and nefazodone   Social History   Socioeconomic History   Marital status: Legally Separated    Spouse name: Not on file   Number of children: 2   Years of education: Not on file   Highest education level: 12th grade  Occupational History   Occupation: [employer] doctor    Comment: unemployed  Tobacco Use   Smoking status: Some Days    Packs/day: 1.00    Years: 50.00    Additional pack years: 0.00    Total pack years: 50.00    Types: Cigarettes    Start date: 1973   Smokeless tobacco: Never   Tobacco comments:    Smokes 1-2 cigarettes some days- 09/24/2022  Vaping Use   Vaping Use: Never used  Substance and Sexual Activity   Alcohol use: Not Currently    Alcohol/week: 0.0 standard drinks of alcohol   Drug use: No   Sexual activity: Not on file  Other Topics Concern   Not on file  Social History Narrative   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   Social Determinants of Health   Financial Resource Strain: Medium Risk (07/23/2020)    Overall Financial Resource Strain (CARDIA)    Difficulty of Paying Living Expenses: Somewhat hard  Food Insecurity: No Food Insecurity (06/01/2022)   Hunger Vital Sign    Worried About Running Out of Food in the Last Year: Never true    Ran Out of Food in the Last Year: Never true  Transportation Needs: No Transportation Needs (06/01/2022)   PRAPARE - Administrator, Civil Service (Medical): No    Lack of Transportation (Non-Medical): No  Physical Activity: Inactive (07/23/2020)   Exercise Vital Sign    Days of Exercise per Week: 0 days    Minutes of Exercise per Session: 0 min  Stress: No Stress Concern Present (07/23/2020)   [employer] of  Occupational Health - Occupational Stress Questionnaire    Feeling of Stress : Not at all  Social Connections: Socially Isolated (07/23/2020)   Social Connection and Isolation Panel [NHANES]    Frequency of Communication with Friends and Family: More than three times a week    Frequency of Social Gatherings with Friends and Family: More than three times a week    Attends Religious Services: Never    Database administrator or Organizations: No    Attends Banker Meetings: Never    Marital Status: Separated     Family History: The patient's family history includes Heart disease in his father; Lung cancer in his mother.  ROS:   Please see the history of present illness.     All other systems reviewed and are negative.  EKGs/Labs/Other Studies Reviewed:    The following studies were reviewed today:   EKG:  EKG is  ordered today.  The ekg ordered today demonstrates normal sinus rhythm, PACs.  Recent Labs: 03/14/2022: Magnesium 2.2; TSH 4.914 07/03/2022: ALT 18; BUN 17; Creatinine, Ser 1.25; Hemoglobin 14.4; Platelets 272; Potassium 3.8; Sodium 141  Recent Lipid Panel    Component Value Date/Time   CHOL 154 11/21/2021 0848   TRIG 76 11/21/2021 0848   HDL 49 11/21/2021 0848   CHOLHDL 3.1 11/21/2021 0848   LDLCALC  90 11/21/2021 0848    Physical Exam:    VS:  BP 134/70 (BP Location: Left Arm, Patient Position: Sitting, Cuff Size: Normal)   Pulse 60   Ht 5\' 11"  (1.803 m)   Wt 209 lb 6.4 oz (95 kg)   SpO2 95%   BMI 29.21 kg/m     Wt Readings from Last 3 Encounters:  11/06/22 209 lb 6.4 oz (95 kg)  09/24/22 213 lb 6.4 oz (96.8 kg)  09/17/22 214 lb (97.1 kg)     GEN:  Well nourished, well developed in no acute distress HEENT: Normal NECK: No JVD; No carotid bruits CARDIAC: RRR, no murmurs, rubs, gallops RESPIRATORY:  Clear to auscultation without rales, wheezing or rhonchi  ABDOMEN: Soft, non-tender, non-distended MUSCULOSKELETAL:  No edema; No deformity  SKIN: Warm and dry NEUROLOGIC:  Alert and oriented x 3 PSYCHIATRIC:  Normal affect   ASSESSMENT:    1. Coronary artery disease involving native coronary artery of native heart without angina pectoris   2. Primary hypertension    PLAN:    In order of problems listed above:  CAD, RCA and LAD calcifications on chest CT. denies chest pain, echo 9/23 EF 60 to 65%, Lexiscan Myoview 10/23 no ischemia, low risk study.  Continue aspirin 81 mg, Pravachol 20 mg daily.  Hypertension, blood pressure controlled.  Continue Cardizem 120mg  qd.  Stop HCTZ due to dehydration, hypokalemia.  Stop potassium.  Start losartan 50 mg daily.  Check BMP in 2 weeks.  Follow-up in 2 months.   Medication Adjustments/Labs and Tests Ordered: Current medicines are reviewed at length with the patient today.  Concerns regarding medicines are outlined above.  Orders Placed This Encounter  Procedures   Basic Metabolic Panel (BMET)   EKG 12-Lead    No orders of the defined types were placed in this encounter.    Patient Instructions  Medication Instructions:   STOP HCTZ STOP Potassium START Losartan - Take one tablet (50mg ) by mouth daily.   *If you need a refill on your cardiac medications before your next appointment, please call your pharmacy*   Lab  Work:  Your physician recommends  that you return for lab work in: 2 weeks at the medical mall.  No appt is needed. Hours are M-F 7AM- 6 PM.  If you have labs (blood work) drawn today and your tests are completely normal, you will receive your results only by: MyChart Message (if you have MyChart) OR A paper copy in the mail If you have any lab test that is abnormal or we need to change your treatment, we will call you to review the results.   Testing/Procedures:  2. None Order    Follow-Up: At [facility], you and your health needs are our priority.  As part of our continuing mission to provide you with exceptional heart care, we have created designated Provider Care Teams.  These Care Teams include your primary Cardiologist (physician) and Advanced Practice Providers (APPs -  Physician Assistants and Nurse Practitioners) who all work together to provide you with the care you need, when you need it.  We recommend signing up for the patient portal called "MyChart".  Sign up information is provided on this After Visit Summary.  MyChart is used to connect with patients for Virtual Visits (Telemedicine).  Patients are able to view lab/test results, encounter notes, upcoming appointments, etc.  Non-urgent messages can be sent to your provider as well.   To learn more about what you can do with MyChart, go to [email or web address].    Your next appointment:   2 month(s)  Provider:   You may see Debbe Odea, MD or one of the following Advanced Practice Providers on your designated Care Team:   Nicolasa Ducking, NP Eula Listen, PA-C Cadence Fransico Michael, PA-C Charlsie Quest, NP   Signed, Debbe Odea, MD  11/06/2022 9:11 AM    [facility]

## 2022-11-11 ENCOUNTER — Telehealth: Payer: Self-pay | Admitting: Cardiology

## 2022-11-11 MED ORDER — LOSARTAN POTASSIUM 50 MG PO TABS: 50.0000 mg | ORAL_TABLET | Freq: Every day | ORAL | 3 refills | Status: DC

## 2022-11-11 NOTE — Telephone Encounter (Signed)
Losartan 50 mg once daily has been sent in for the patient. He has been made aware.   Per office note on 5/10: Hypertension, blood pressure controlled.  Continue Cardizem 120mg  qd.  Stop HCTZ due to dehydration, hypokalemia.  Stop potassium.  Start losartan 50 mg daily.  Check BMP in 2 weeks.

## 2022-11-11 NOTE — Telephone Encounter (Signed)
Pt c/o medication issue:  1. Name of Medication: Losartan 50mg    2. How are you currently taking this medication (dosage and times per day)?  1 tablet daily   3. Are you having a reaction (difficulty breathing--STAT)? no  4. What is your medication issue? Patient states when he was in earlier this week this script was suppose to be sent to the pharmacy for him but it wasn't.  He would like for it to be sent to  [id number], [place] - [address]

## 2022-11-24 ENCOUNTER — Ambulatory Visit (INDEPENDENT_AMBULATORY_CARE_PROVIDER_SITE_OTHER): Payer: Medicare HMO | Admitting: Family Medicine

## 2022-11-24 ENCOUNTER — Encounter: Payer: Self-pay | Admitting: Family Medicine

## 2022-11-24 VITALS — BP 131/58 | HR 54 | Temp 98.3°F | Resp 12 | Ht 71.0 in | Wt 211.0 lb

## 2022-11-24 DIAGNOSIS — R7303 Prediabetes: Secondary | ICD-10-CM

## 2022-11-24 DIAGNOSIS — Z125 Encounter for screening for malignant neoplasm of prostate: Secondary | ICD-10-CM | POA: Diagnosis not present

## 2022-11-24 DIAGNOSIS — G4733 Obstructive sleep apnea (adult) (pediatric): Secondary | ICD-10-CM

## 2022-11-24 DIAGNOSIS — R7989 Other specified abnormal findings of blood chemistry: Secondary | ICD-10-CM

## 2022-11-24 DIAGNOSIS — I10 Essential (primary) hypertension: Secondary | ICD-10-CM

## 2022-11-24 DIAGNOSIS — Z0000 Encounter for general adult medical examination without abnormal findings: Secondary | ICD-10-CM

## 2022-11-24 DIAGNOSIS — Z289 Immunization not carried out for unspecified reason: Secondary | ICD-10-CM

## 2022-11-24 DIAGNOSIS — Z23 Encounter for immunization: Secondary | ICD-10-CM

## 2022-11-24 DIAGNOSIS — H353221 Exudative age-related macular degeneration, left eye, with active choroidal neovascularization: Secondary | ICD-10-CM | POA: Diagnosis not present

## 2022-11-24 DIAGNOSIS — H3530 Unspecified macular degeneration: Secondary | ICD-10-CM | POA: Insufficient documentation

## 2022-11-24 DIAGNOSIS — F17200 Nicotine dependence, unspecified, uncomplicated: Secondary | ICD-10-CM

## 2022-11-24 MED ORDER — SHINGRIX 50 MCG/0.5ML IM SUSR
0.5000 mL | Freq: Once | INTRAMUSCULAR | 0 refills | Status: AC
Start: 2022-11-24 — End: 2022-11-24

## 2022-11-24 MED ORDER — TETANUS-DIPHTH-ACELL PERTUSSIS 5-2.5-18.5 LF-MCG/0.5 IM SUSY
0.5000 mL | PREFILLED_SYRINGE | Freq: Once | INTRAMUSCULAR | 0 refills | Status: AC
Start: 2022-11-24 — End: 2022-11-24

## 2022-11-24 NOTE — Progress Notes (Signed)
I,Sulibeya S Dimas,acting as a scribe for Mila Merry, MD.,have documented all relevant documentation on the behalf of Mila Merry, MD,as directed by  Mila Merry, MD while in the presence of Mila Merry, MD.    Complete Physical Exam      Patient: Jeffrey Torres, Male    DOB: February 22, 1946, 77 y.o.   MRN: [id number] Visit Date: 11/24/2022  Today's Provider: Mila Merry, MD   Chief Complaint  Patient presents with   Medicare Wellness   Subjective    WALEED NAIFEH is a 77 y.o. male who presents today for his complete physical examination. He reports consuming a general diet. Home exercise routine includes yard work. He generally feels well. He reports sleeping poorly. He does not have additional problems to discuss today.    Medications: Outpatient Medications Prior to Visit  Medication Sig   albuterol (VENTOLIN HFA) 108 (90 Base) MCG/ACT inhaler Inhale 2 puffs into the lungs every 6 (six) hours as needed.   aspirin 81 MG tablet Take 81 mg by mouth daily.   buPROPion (WELLBUTRIN SR) 150 MG 12 hr tablet Take 1 tablet (150 mg total) by mouth 2 (two) times daily. (Patient not taking: Reported on 11/06/2022)   cycloSPORINE (RESTASIS) 0.05 % ophthalmic emulsion Place 1 drop into both eyes daily as needed (dry eyes).   diltiazem (CARDIZEM CD) 120 MG 24 hr capsule Take 1 capsule (120 mg total) by mouth daily.   fluticasone (FLONASE) 50 MCG/ACT nasal spray Place 2 sprays into both nostrils every morning.  (Patient not taking: Reported on 11/06/2022)   Fluticasone-Umeclidin-Vilant (TRELEGY ELLIPTA) 100-62.5-25 MCG/ACT AEPB Inhale 1 puff into the lungs daily. (Patient not taking: Reported on 11/06/2022)   gabapentin (NEURONTIN) 300 MG capsule Take 1 capsule (300 mg total) by mouth 2 (two) times daily. (Patient not taking: Reported on 07/15/2022)   KLOR-CON M20 20 MEQ tablet Take 20 mEq by mouth daily.   losartan (COZAAR) 50 MG tablet Take 1 tablet (50 mg total) by mouth daily.    Multiple Vitamins-Minerals (PRESERVISION AREDS 2 PO) Take 2 capsules by mouth every morning.    omeprazole (PRILOSEC) 20 MG capsule Take 20 mg by mouth daily as needed (acid reflux).   ramelteon (ROZEREM) 8 MG tablet Take 1 tablet (8 mg total) by mouth at bedtime. (Patient not taking: Reported on 07/15/2022)   sertraline (ZOLOFT) 100 MG tablet TAKE 1 TABLET BY MOUTH EVERY DAY   tamsulosin (FLOMAX) 0.4 MG CAPS capsule Take 1 capsule (0.4 mg total) by mouth daily after supper. (Patient not taking: Reported on 09/17/2022)   No facility-administered medications prior to visit.    Allergies  Allergen Reactions   Penicillins Anaphylaxis and Swelling    Did it involve swelling of the face/tongue/throat, SOB, or low BP? Yes Did it involve sudden or severe rash/hives, skin peeling, or any reaction on the inside of your mouth or nose? No Did you need to seek medical attention at a hospital or doctor's office? No When did it last happen?      10 + years ago If all above answers are "NO", may proceed with cephalosporin use.    Atorvastatin Other (See Comments)    myalgia   Trazodone And Nefazodone     Caused fainting spells    Patient Care Team: Malva Limes, MD as PCP - General (Family Medicine) Debbe Odea, MD as PCP - Cardiology (Cardiology) Galen Manila, MD as Referring Physician (Ophthalmology) Bud Face, MD as Referring Physician (Otolaryngology) Carmina Miller,  MD as Referring Physician (Radiation Oncology) Nicholaus Corolla, MD as Referring Physician (Ophthalmology) Glory Buff, RN as Oncology Nurse Navigator Salena Saner, MD as Consulting Physician (Pulmonary Disease) Kemper Durie, RN as [facility] Care Management  Review of Systems  Constitutional:  Negative for chills, diaphoresis and fever.  HENT:  Negative for congestion, ear discharge, ear pain, hearing loss, nosebleeds, sore throat and tinnitus.   Eyes:  Negative for photophobia, pain,  discharge and redness.  Respiratory:  Negative for cough, shortness of breath, wheezing and stridor.   Cardiovascular:  Negative for chest pain, palpitations and leg swelling.  Gastrointestinal:  Negative for abdominal pain, blood in stool, constipation, diarrhea, nausea and vomiting.  Endocrine: Negative for polydipsia.  Genitourinary:  Negative for dysuria, flank pain, frequency, hematuria and urgency.  Musculoskeletal:  Negative for back pain, myalgias and neck pain.  Skin:  Negative for rash.  Allergic/Immunologic: Negative for environmental allergies.  Neurological:  Negative for dizziness, tremors, seizures, weakness and headaches.  Hematological:  Does not bruise/bleed easily.  Psychiatric/Behavioral:  Positive for sleep disturbance. Negative for hallucinations and suicidal ideas. The patient is nervous/anxious.   All other systems reviewed and are negative.   Last CBC Lab Results  Component Value Date   WBC 7.9 07/03/2022   HGB 14.4 07/03/2022   HCT 43.1 07/03/2022   MCV 92 07/03/2022   MCH 30.6 07/03/2022   RDW 12.4 07/03/2022   PLT 272 07/03/2022   Last metabolic panel Lab Results  Component Value Date   GLUCOSE 173 (H) 07/03/2022   NA 141 07/03/2022   K 3.8 07/03/2022   CL 102 07/03/2022   CO2 24 07/03/2022   BUN 17 07/03/2022   CREATININE 1.25 07/03/2022   EGFR 60 07/03/2022   CALCIUM 8.7 07/03/2022   PHOS 3.5 08/07/2020   PROT 6.6 07/03/2022   ALBUMIN 4.4 07/03/2022   LABGLOB 2.2 07/03/2022   AGRATIO 2.0 07/03/2022   BILITOT <0.2 07/03/2022   ALKPHOS 62 07/03/2022   AST 23 07/03/2022   ALT 18 07/03/2022   ANIONGAP 8 06/24/2022   Last lipids Lab Results  Component Value Date   CHOL 154 11/21/2021   HDL 49 11/21/2021   LDLCALC 90 11/21/2021   TRIG 76 11/21/2021   CHOLHDL 3.1 11/21/2021   Last hemoglobin A1c Lab Results  Component Value Date   HGBA1C 5.8 (H) 07/03/2022   Last thyroid functions Lab Results  Component Value Date   TSH 4.914  (H) 03/14/2022     Lab Results  Component Value Date   TSH 4.914 (H) 03/14/2022      Objective    Vitals: There were no vitals taken for this visit. BP Readings from Last 3 Encounters:  11/06/22 134/70  09/24/22 120/60  09/17/22 (!) 149/69   Wt Readings from Last 3 Encounters:  11/06/22 209 lb 6.4 oz (95 kg)  09/24/22 213 lb 6.4 oz (96.8 kg)  09/17/22 214 lb (97.1 kg)    Physical Exam  General Appearance:    Well developed, well nourished male. Alert, cooperative, in no acute distress, appears stated age  Head:    Normocephalic, without obvious abnormality, atraumatic  Eyes:    PERRL, conjunctiva/corneas clear, EOM's intact, fundi    benign, both eyes       Ears:    Normal TM's and external ear canals, both ears  Nose:   Nares normal, septum midline, mucosa normal, no drainage   or sinus tenderness  Throat:   Lips, mucosa, and tongue  normal; teeth and gums normal  Neck:   Supple, symmetrical, trachea midline, no adenopathy;       thyroid:  No enlargement/tenderness/nodules; no carotid   bruit or JVD  Back:     Symmetric, no curvature, ROM normal, no CVA tenderness  Lungs:     Clear to auscultation bilaterally, respirations unlabored  Chest wall:    No tenderness or deformity  Heart:    Bradycardic. Regularly irregular rhythm. No murmurs, rubs, or gallops.  S1 and S2 normal  Abdomen:     Soft, non-tender, bowel sounds active all four quadrants,    no masses, no organomegaly  Genitalia:    deferred  Rectal:    deferred  Extremities:   All extremities are intact. No cyanosis or edema  Pulses:   2+ and symmetric all extremities  Skin:   Skin color, texture, turgor normal, no rashes or lesions  Lymph nodes:   Cervical, supraclavicular, and axillary nodes normal  Neurologic:   CNII-XII intact. Normal strength, sensation and reflexes      throughout      Assessment & Plan    1. Annual physical exam Reminded he will be due for colonoscopy in the fall. Will place  referral a referral to Dr. Servando Snare a few months before.   2. Prostate cancer screening  - PSA Total (Reflex To Free) ([facility] only)  3. Prescription for Tdap. Vaccine not administered in office.   - Tdap (BOOSTRIX) 5-2.5-18.5 LF-MCG/0.5 injection; Inject 0.5 mLs into the muscle once for 1 dose.  Dispense: 0.5 mL; Refill: 0  4. Prescription for Shingrix. Vaccine not administered in office.   - Zoster Vaccine Adjuvanted [facility]) injection; Inject 0.5 mLs into the muscle once for 1 dose.  Dispense: 0.5 mL; Refill: 0  5. Primary hypertension Well controlled.  Continue current medications.   - Comprehensive metabolic panel - Lipid panel  6. Prediabetes  - Hemoglobin A1c  7. Severe obstructive sleep apnea Intolerant to CPAP  8. Tobacco dependence Encourage smoking cessation.   9. Elevated TSH  - TSH - T4, free     The entirety of the information documented in the History of Present Illness, Review of Systems and Physical Exam were personally obtained by me. Portions of this information were initially documented by the CMA and reviewed by me for thoroughness and accuracy.     Mila Merry, MD  [facility] [phone] (phone) [phone] (fax)  [facility]

## 2022-11-24 NOTE — Progress Notes (Signed)
Annual Wellness Visit     Patient: Jeffrey Torres, Male    DOB: 02-Feb-1946, 77 y.o.   MRN: [id number] Visit Date: 11/24/2022  Today's Provider: Mila Merry, MD    Subjective    Jeffrey Torres is a 77 y.o. male who presents today for his Annual Wellness Visit.   Medications: Outpatient Medications Prior to Visit  Medication Sig Note   albuterol (VENTOLIN HFA) 108 (90 Base) MCG/ACT inhaler Inhale 2 puffs into the lungs every 6 (six) hours as needed.    aspirin 81 MG tablet Take 81 mg by mouth daily.    diltiazem (CARDIZEM CD) 120 MG 24 hr capsule Take 1 capsule (120 mg total) by mouth daily.    fluticasone (FLONASE) 50 MCG/ACT nasal spray Place 2 sprays into both nostrils every morning.    losartan (COZAAR) 50 MG tablet Take 1 tablet (50 mg total) by mouth daily.    Multiple Vitamins-Minerals (PRESERVISION AREDS 2 PO) Take 2 capsules by mouth every morning.     sertraline (ZOLOFT) 100 MG tablet TAKE 1 TABLET BY MOUTH EVERY DAY    [DISCONTINUED] buPROPion (WELLBUTRIN SR) 150 MG 12 hr tablet Take 1 tablet (150 mg total) by mouth 2 (two) times daily. 11/24/2022: patient previously stopped taking   [DISCONTINUED] cycloSPORINE (RESTASIS) 0.05 % ophthalmic emulsion Place 1 drop into both eyes daily as needed (dry eyes). 11/24/2022: stopped by eye doctor per patient   [DISCONTINUED] Fluticasone-Umeclidin-Vilant (TRELEGY ELLIPTA) 100-62.5-25 MCG/ACT AEPB Inhale 1 puff into the lungs daily. (Patient not taking: Reported on 11/06/2022)    [DISCONTINUED] gabapentin (NEURONTIN) 300 MG capsule Take 1 capsule (300 mg total) by mouth 2 (two) times daily.    [DISCONTINUED] KLOR-CON M20 20 MEQ tablet Take 20 mEq by mouth daily. 11/24/2022: per patient was discontinued by his cardiologist   [DISCONTINUED] omeprazole (PRILOSEC) 20 MG capsule Take 20 mg by mouth daily as needed (acid reflux). 11/24/2022: stopped a long time   [DISCONTINUED] ramelteon (ROZEREM) 8 MG tablet Take 1 tablet (8 mg total) by  mouth at bedtime. 11/24/2022: didn't work   [DISCONTINUED] tamsulosin (FLOMAX) 0.4 MG CAPS capsule Take 1 capsule (0.4 mg total) by mouth daily after supper. 11/24/2022: stopped taking over a year ago by patient report   No facility-administered medications prior to visit.    Allergies  Allergen Reactions   Penicillins Anaphylaxis and Swelling    Did it involve swelling of the face/tongue/throat, SOB, or low BP? Yes Did it involve sudden or severe rash/hives, skin peeling, or any reaction on the inside of your mouth or nose? No Did you need to seek medical attention at a hospital or doctor's office? No When did it last happen?      10 + years ago If all above answers are "NO", may proceed with cephalosporin use.    Atorvastatin Other (See Comments)    myalgia   Trazodone And Nefazodone     Caused fainting spells   Social History   Socioeconomic History   Marital status: Divorced    Spouse name: Not on file   Number of children: 2   Years of education: Not on file   Highest education level: 12th grade  Occupational History   Occupation: Truck driver    Comment: unemployed  Tobacco Use   Smoking status: Some Days    Packs/day: 1.00    Years: 50.00    Additional pack years: 0.00    Total pack years: 50.00    Types: Cigarettes  Start date: 1973   Smokeless tobacco: Never   Tobacco comments:    Smokes 1-2 cigarettes some days- 09/24/2022  Vaping Use   Vaping Use: Never used  Substance and Sexual Activity   Alcohol use: Not Currently    Alcohol/week: 0.0 standard drinks of alcohol   Drug use: No   Sexual activity: Not on file  Other Topics Concern   Not on file  Social History Narrative   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   Social Determinants of Health   Financial Resource Strain: Medium Risk (07/23/2020)   Overall Financial Resource Strain (CARDIA)    Difficulty of Paying Living Expenses: Somewhat hard  Food Insecurity: No Food Insecurity (06/01/2022)    Hunger Vital Sign    Worried About Running Out of Food in the Last Year: Never true    Ran Out of Food in the Last Year: Never true  Transportation Needs: No Transportation Needs (06/01/2022)   PRAPARE - Administrator, Civil Service (Medical): No    Lack of Transportation (Non-Medical): No  Physical Activity: Inactive (07/23/2020)   Exercise Vital Sign    Days of Exercise per Week: 0 days    Minutes of Exercise per Session: 0 min  Stress: No Stress Concern Present (07/23/2020)   [employer] of Occupational Health - Occupational Stress Questionnaire    Feeling of Stress : Not at all  Social Connections: Socially Isolated (07/23/2020)   Social Connection and Isolation Panel [NHANES]    Frequency of Communication with Friends and Family: More than three times a week    Frequency of Social Gatherings with Friends and Family: More than three times a week    Attends Religious Services: Never    Database administrator or Organizations: No    Attends Banker Meetings: Never    Marital Status: Separated    Patient Care Team: Malva Limes, MD as PCP - General (Family Medicine) Debbe Odea, MD as PCP - Cardiology (Cardiology) Galen Manila, MD as Referring Physician (Ophthalmology) Bud Face, MD as Referring Physician (Otolaryngology) Carmina Miller, MD as Referring Physician (Radiation Oncology) Nicholaus Corolla, MD as Referring Physician (Ophthalmology) Glory Buff, RN as Oncology Nurse Navigator Salena Saner, MD as Consulting Physician (Pulmonary Disease) Kemper Durie, RN as [facility] Care Management  Objective     Most recent functional status assessment:    11/24/2022    9:06 AM  In your present state of health, do you have any difficulty performing the following activities:  Hearing? 1  Vision? 1  Difficulty concentrating or making decisions? 0  Walking or climbing stairs? 0  Dressing or bathing? 0  Doing  errands, shopping? 0   Most recent fall risk assessment:    11/24/2022    9:05 AM  Fall Risk   Falls in the past year? 0  Number falls in past yr: 0  Injury with Fall? 0  Risk for fall due to : No Fall Risks  Follow up Falls evaluation completed    Most recent depression screenings:    11/24/2022    9:06 AM 07/03/2022   10:58 AM  PHQ 2/9 Scores  PHQ - 2 Score 0 0  PHQ- 9 Score 4 1   Most recent cognitive screening:    07/20/2019    2:57 PM  6CIT Screen  What Year? 0 points  What month? 0 points  What time? 0 points  Count back from 20 0 points  Months in reverse  0 points  Repeat phrase 4 points  Total Score 4 points   Most recent Audit-C alcohol use screening    11/24/2022    9:06 AM  Alcohol Use Disorder Test (AUDIT)  1. How often do you have a drink containing alcohol? 3  2. How many drinks containing alcohol do you have on a typical day when you are drinking? 0  3. How often do you have six or more drinks on one occasion? 0  AUDIT-C Score 3   A score of 3 or more in women, and 4 or more in men indicates increased risk for alcohol abuse, EXCEPT if all of the points are from question 1    Assessment & Plan     Annual wellness visit done today including the all of the following: Reviewed patient's Family Medical History Reviewed and updated list of patient's medical providers Assessment of cognitive impairment was done Assessed patient's functional ability Established a written schedule for health screening services Health Risk Assessent Completed and Reviewed  Exercise Activities and Dietary recommendations  Goals      Adjust to New diagnosis of lung cancer     Care Coordination Interventions: Assessed patient understanding of cancer diagnosis and recommended treatment plan Reviewed upcoming provider appointments and treatment appointments Assessed available transportation to appointments and treatments. Has consistent/reliable transportation: Yes Assessed  support system. Has consistent/reliable family or other support: Yes Appointment for CT went well, no further issues with cancer, will have 6 month follow up.  Was seen by pulmonary yesterday for cough/congestion, given antibiotics and will follow up on 3/28.  PCP office will call to schedule 6 month follow up, last seen on 10/25       DIET - REDUCE CALORIE INTAKE     Recommend continuing current diet plan of avoiding fried foods and junk food to help aid in weight loss.       Effective management of HTN     Care Coordination Interventions: Evaluation of current treatment plan related to hypertension self management and patient's adherence to plan as established by provider Provided assistance with obtaining home blood pressure monitor via confirmed machine has been received; Reviewed scheduled/upcoming provider appointments including:  Advised to monitor blood pressure, record readings and share with provider during next visit. Cardiology on 5/10 and PCP on 5/28.  Advised to call to schedule AWV. Discussed medications, state he has restarted antihypertensives         Immunization History  Administered Date(s) Administered   Fluad Quad(high Dose 65+) 08/07/2020, 03/18/2021, 03/17/2022   Influenza, High Dose Seasonal PF 03/25/2017, 03/29/2018, 04/24/2019   Pneumococcal Conjugate-13 04/26/2014   Pneumococcal Polysaccharide-23 05/28/2012    Health Maintenance  Topic Date Due   COVID-19 Vaccine (1) Never done   DTaP/Tdap/Td (1 - Tdap) Never done   Zoster Vaccines- Shingrix (1 of 2) Never done   Colonoscopy  03/13/2023   INFLUENZA VACCINE  01/28/2023   Lung Cancer Screening  03/10/2023   Medicare Annual Wellness (AWV)  11/24/2023   Pneumonia Vaccine 72+ Years old  Completed   Hepatitis C Screening  Completed   HPV VACCINES  Aged Out     Discussed health benefits of physical activity, and encouraged him to engage in regular exercise appropriate for his age and condition.            Mila Merry, MD  [facility] [phone] (phone) [phone] (fax)  [facility]

## 2022-11-24 NOTE — Patient Instructions (Signed)
Please review the attached list of medications and notify my office if there are any errors.   You will be due for a follow up colonoscopy in the fall. You should get a call from [facility] in a few months to have it scheduled  The CDC recommends two doses of Shingrix (the shingles vaccine) separated by 2 to 6 months for adults age 76 years and older. I recommend checking with your pharmacy plan regarding coverage for this vaccine.     You are due for a Tdap (tetanus-diptheria-pertussis vaccine) which protects you from tetanus and whooping cough. Please check with your insurance plan or pharmacy regarding coverage for this vaccine.

## 2022-11-25 ENCOUNTER — Other Ambulatory Visit: Payer: Self-pay

## 2022-11-25 LAB — LIPID PANEL
Chol/HDL Ratio: 3.5 ratio (ref 0.0–5.0)
Cholesterol, Total: 136 mg/dL (ref 100–199)
HDL: 39 mg/dL — ABNORMAL LOW (ref >39)
LDL Chol Calc (NIH): 78 mg/dL (ref 0–99)
Triglycerides: 104 mg/dL (ref 0–149)
VLDL Cholesterol Cal: 19 mg/dL (ref 5–40)

## 2022-11-25 LAB — COMPREHENSIVE METABOLIC PANEL
ALT: 17 IU/L (ref 0–44)
AST: 23 IU/L (ref 0–40)
Albumin/Globulin Ratio: 1.9 (ref 1.2–2.2)
Albumin: 4.3 g/dL (ref 3.8–4.8)
Alkaline Phosphatase: 64 IU/L (ref 44–121)
BUN/Creatinine Ratio: 12 (ref 10–24)
BUN: 14 mg/dL (ref 8–27)
Bilirubin Total: 0.3 mg/dL (ref 0.0–1.2)
CO2: 22 mmol/L (ref 20–29)
Calcium: 9 mg/dL (ref 8.6–10.2)
Chloride: 103 mmol/L (ref 96–106)
Creatinine, Ser: 1.17 mg/dL (ref 0.76–1.27)
Globulin, Total: 2.3 g/dL (ref 1.5–4.5)
Glucose: 98 mg/dL (ref 70–99)
Potassium: 4.2 mmol/L (ref 3.5–5.2)
Sodium: 141 mmol/L (ref 134–144)
Total Protein: 6.6 g/dL (ref 6.0–8.5)
eGFR: 65 mL/min/{1.73_m2} (ref >59)

## 2022-11-25 LAB — PSA TOTAL (REFLEX TO FREE): Prostate Specific Ag, Serum: 3 ng/mL (ref 0.0–4.0)

## 2022-11-25 LAB — T4, FREE: Free T4: 1.03 ng/dL (ref 0.82–1.77)

## 2022-11-25 LAB — HEMOGLOBIN A1C
Est. average glucose Bld gHb Est-mCnc: 126 mg/dL
Hgb A1c MFr Bld: 6 % — ABNORMAL HIGH (ref 4.8–5.6)

## 2022-11-25 LAB — TSH: TSH: 3.59 u[IU]/mL (ref 0.450–4.500)

## 2022-11-25 MED ORDER — DILTIAZEM HCL ER COATED BEADS 120 MG PO CP24: 120.0000 mg | ORAL_CAPSULE | Freq: Every day | ORAL | 0 refills | Status: DC

## 2022-11-25 NOTE — Telephone Encounter (Signed)
Requested Prescriptions   Signed Prescriptions Disp Refills   diltiazem (CARDIZEM CD) 120 MG 24 hr capsule 90 capsule 0    Sig: Take 1 capsule (120 mg total) by mouth daily.    Authorizing Provider: Debbe Odea    Ordering User: Guerry Minors

## 2022-11-26 ENCOUNTER — Ambulatory Visit: Payer: Self-pay | Admitting: *Deleted

## 2022-11-26 NOTE — Patient Instructions (Signed)
Visit Information  Thank you for taking time to visit with me today. Please don't hesitate to contact me if I can be of assistance to you.  Please call the Suicide and Crisis Lifeline: 988 call the [place] National Suicide Prevention Lifeline: 1-800-273-8255 or TTY: 1-800-799-4 TTY (1-800-799-4889) to talk to a trained counselor call 1-800-273-TALK (toll free, 24 hour hotline) call 911 if you are experiencing a Mental Health or Behavioral Health Crisis or need someone to talk to.  Patient verbalizes understanding of instructions and care plan provided today and agrees to view in MyChart. Active MyChart status and patient understanding of how to access instructions and care plan via MyChart confirmed with patient.     The patient has been provided with contact information for the care management team and has been advised to call with any health related questions or concerns.   Tylicia Sherman, RN, MSN, CCM [facility] Care Management Care Management Coordinator [phone]     

## 2022-11-26 NOTE — Patient Outreach (Signed)
  Care Coordination   Follow Up Visit Note   11/26/2022 Name: Jeffrey Torres MRN: [id number] DOB: 1946/06/12  Jeffrey Torres is a 77 y.o. year old male who sees Fisher, Demetrios Isaacs, MD for primary care. I spoke with  [name] by phone today.  What matters to the patients health and wellness today?  Ongoing management of HTN and DM.  Does not feel follow up is needed at this but he will call with questions.     Goals Addressed             This Visit's Progress    Effective management of HTN   On track    Care Coordination Interventions: Evaluation of current treatment plan related to hypertension self management and patient's adherence to plan as established by provider Provided assistance with obtaining home blood pressure monitor via confirmed machine has been received; Reviewed scheduled/upcoming provider appointments including:  Advised to monitor blood pressure, record readings and share with provider during next visit. Cardiology on 5/10 and PCP on 5/28.  Advised to call to schedule AWV. Discussed medications, state he has restarted antihypertensives  5/30 - goal met, HTN currently controlled        SDOH assessments and interventions completed:  No     Care Coordination Interventions:  Yes, provided   Interventions Today    Flowsheet Row Most Recent Value  Chronic Disease   Chronic disease during today's visit Hypertension (HTN)  General Interventions   General Interventions Discussed/Reviewed General Interventions Reviewed, Doctor Visits  Doctor Visits Discussed/Reviewed Doctor Visits Reviewed  [cardiology 7/10 and pulmonary 8/5]  Education Interventions   Education Provided Provided Education  Provided Verbal Education On Medication, Blood Sugar Monitoring, When to see the doctor, Other  [Encouraged to continue daily monitoring of BP and blood sugar. Today was 133/79 and 129]        Follow up plan: No further intervention required.   Encounter Outcome:   Pt. Visit Completed   Kemper Durie, RN,  MSN, [facility] Care Management Care Management Coordinator [phone]

## 2023-01-06 ENCOUNTER — Ambulatory Visit: Payer: Medicare HMO | Attending: Cardiology | Admitting: Cardiology

## 2023-01-06 ENCOUNTER — Encounter: Payer: Self-pay | Admitting: Cardiology

## 2023-01-06 VITALS — BP 138/62 | HR 57 | Ht 71.0 in | Wt 212.6 lb

## 2023-01-06 DIAGNOSIS — I2510 Atherosclerotic heart disease of native coronary artery without angina pectoris: Secondary | ICD-10-CM | POA: Diagnosis not present

## 2023-01-06 DIAGNOSIS — I10 Essential (primary) hypertension: Secondary | ICD-10-CM | POA: Diagnosis not present

## 2023-01-06 NOTE — Progress Notes (Signed)
Cardiology Office Note:    Date:  01/06/2023   ID:  Jeffrey Torres, DOB 04/26/1946, MRN [id number]  PCP:  Jeffrey Limes, MD  [facility] Cardiologist:  Jeffrey Odea, MD  [facility] Electrophysiologist:  None   Referring MD: Jeffrey Limes, MD   Chief Complaint  Patient presents with   Follow-up    Patient denies new or acute cardiac problems/concerns today.      History of Present Illness:    Jeffrey Torres is a 77 y.o. male with a hx of CAD (LAD and RCA calcifications on chest CT 10/23), hypertension, OSA, former smoker x35+ years, COPD, non-small cell right lung cancer s/p surgical resection who presents for follow-up.    Being seen for CAD and hypertension, medication adjusted after last visit due to hypokalemia and history of renal dysfunction.  HCTZ was stopped, losartan was started.  Follow-up CBC showed normal potassium and renal function.  He feels well, denies chest pain, tolerating medications as prescribed.  Denies any concerns at this time.  Prior notes.   Lexiscan Myoview 10/23, low risk study, no evidence of ischemia Echo 02/2022 EF 60 to 65%, aortic valve sclerosis Patient on Cardizem due to history of PACs, PVCs, atrial bigeminy and hypertension. diagnosed with OSA, CPAP mask recommended but patient does not like to wear a mask.  He will try to lose weight to see if this will help with his sleep apnea.     Past Medical History:  Diagnosis Date   Acute metabolic encephalopathy 07/31/2020   Anxiety    Arthritis    Atrial fibrillation (HCC)    Cancer (HCC)    larynx ca, surgery and radiation   GERD (gastroesophageal reflux disease)    Heart murmur    Hernia, inguinal, bilateral    History of chicken pox    History of kidney stones    History of measles    History of mumps    History of shingles    HOH (hard of hearing)    Hypertension    Macular degeneration of both eyes    Motion sickness    ocean boat   Pneumonia due to COVID-19 virus  07/31/2020   PVC (premature ventricular contraction)     Past Surgical History:  Procedure Laterality Date   CARDIAC CATHETERIZATION  2010   normal per patient report   CATARACT EXTRACTION W/PHACO Right 07/18/2019   Procedure: CATARACT EXTRACTION PHACO AND INTRAOCULAR LENS PLACEMENT (IOC) RIGHT 4.54 00:29.7;  Surgeon: Galen Manila, MD;  Location: [facility];  Service: Ophthalmology;  Laterality: Right;   CATARACT EXTRACTION W/PHACO Left 08/08/2019   Procedure: CATARACT EXTRACTION PHACO AND INTRAOCULAR LENS PLACEMENT (IOC) LEFT;  Surgeon: Galen Manila, MD;  Location: [facility] ORS;  Service: Ophthalmology;  Laterality: Left;  Lot #[id number] H [place]: 00:34.5 CDE: 4.75   COLONOSCOPY  06/24/2006   Dr. Lemar Livings. Multiple benign appearing 5mm polyps in the cecum and in the rectum. -Three 8mm polyps in the transversed colon, in the tranverse colon, proximal and in the distal transverse colon. Resected and retrieved.   COLONOSCOPY WITH PROPOFOL N/A 03/12/2020   Procedure: COLONOSCOPY WITH PROPOFOL;  Surgeon: Midge Minium, MD;  Location: [facility] ENDOSCOPY;  Service: Endoscopy;  Laterality: N/A;   CYSTOSCOPY WITH HOLMIUM LASER LITHOTRIPSY  2001   kidney stones removed, [facility]   HERNIA REPAIR     umbilical and bilateral inguninal   INTERCOSTAL NERVE BLOCK  05/28/2022   Procedure: INTERCOSTAL NERVE BLOCK;  Surgeon: Loreli Slot, MD;  Location: [facility] OR;  Service: Thoracic;;   LARYNGOSCOPY Right 04/01/2017   Procedure: SUSPENSION LARYNGOSCOPY WITH MICROFLAP EXCISION;  Surgeon: Bud Face, MD;  Location: [facility] ORS;  Service: ENT;  Laterality: Right;   LYMPH NODE DISSECTION  05/28/2022   Procedure: LYMPH NODE DISSECTION;  Surgeon: Loreli Slot, MD;  Location: [facility] OR;  Service: Thoracic;;   sinus surgery   2002   [place]; removal of polyps   TONSILLECTOMY  1954   Tubular adenoma removed  06/24/2006    Current Medications: Current Meds  Medication Sig   albuterol  (VENTOLIN HFA) 108 (90 Base) MCG/ACT inhaler Inhale 2 puffs into the lungs every 6 (six) hours as needed.   aspirin 81 MG tablet Take 81 mg by mouth daily.   diltiazem (CARDIZEM CD) 120 MG 24 hr capsule Take 1 capsule (120 mg total) by mouth daily.   fluticasone (FLONASE) 50 MCG/ACT nasal spray Place 2 sprays into both nostrils every morning.   losartan (COZAAR) 50 MG tablet Take 1 tablet (50 mg total) by mouth daily.   Multiple Vitamins-Minerals (PRESERVISION AREDS 2 PO) Take 2 capsules by mouth every morning.    sertraline (ZOLOFT) 100 MG tablet TAKE 1 TABLET BY MOUTH EVERY DAY     Allergies:   Penicillins, Atorvastatin, and Trazodone and nefazodone   Social History   Socioeconomic History   Marital status: Divorced    Spouse name: Not on file   Number of children: 2   Years of education: Not on file   Highest education level: 12th grade  Occupational History   Occupation: [employer] doctor    Comment: unemployed  Tobacco Use   Smoking status: Some Days    Packs/day: 1.00    Years: 50.00    Additional pack years: 0.00    Total pack years: 50.00    Types: Cigarettes    Start date: 1973   Smokeless tobacco: Never   Tobacco comments:    Smokes 1-2 cigarettes some days- 09/24/2022  Vaping Use   Vaping Use: Never used  Substance and Sexual Activity   Alcohol use: Not Currently    Alcohol/week: 0.0 standard drinks of alcohol   Drug use: No   Sexual activity: Not on file  Other Topics Concern   Not on file  Social History Narrative   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   Social Determinants of Health   Financial Resource Strain: Medium Risk (07/23/2020)   Overall Financial Resource Strain (CARDIA)    Difficulty of Paying Living Expenses: Somewhat hard  Food Insecurity: No Food Insecurity (06/01/2022)   Hunger Vital Sign    Worried About Running Out of Food in the Last Year: Never true    Ran Out of Food in the Last Year: Never true  Transportation Needs: No  Transportation Needs (06/01/2022)   PRAPARE - Administrator, Civil Service (Medical): No    Lack of Transportation (Non-Medical): No  Physical Activity: Inactive (07/23/2020)   Exercise Vital Sign    Days of Exercise per Week: 0 days    Minutes of Exercise per Session: 0 min  Stress: No Stress Concern Present (07/23/2020)   [employer] of Occupational Health - Occupational Stress Questionnaire    Feeling of Stress : Not at all  Social Connections: Socially Isolated (07/23/2020)   Social Connection and Isolation Panel [NHANES]    Frequency of Communication with Friends and Family: More than three times a week    Frequency of Social Gatherings with Friends  and Family: More than three times a week    Attends Religious Services: Never    Active Member of Clubs or Organizations: No    Attends Banker Meetings: Never    Marital Status: Separated     Family History: The patient's family history includes Heart disease in his father; Lung cancer in his mother.  ROS:   Please see the history of present illness.     All other systems reviewed and are negative.  EKGs/Labs/Other Studies Reviewed:    The following studies were reviewed today:   EKG:  EKG not ordered today.    Recent Labs: 03/14/2022: Magnesium 2.2 07/03/2022: Hemoglobin 14.4; Platelets 272 11/24/2022: ALT 17; BUN 14; Creatinine, Ser 1.17; Potassium 4.2; Sodium 141; TSH 3.590  Recent Lipid Panel    Component Value Date/Time   CHOL 136 11/24/2022 0938   TRIG 104 11/24/2022 0938   HDL 39 (L) 11/24/2022 0938   CHOLHDL 3.5 11/24/2022 0938   LDLCALC 78 11/24/2022 0938    Physical Exam:    VS:  BP 138/62 (BP Location: Left Arm, Patient Position: Sitting, Cuff Size: Normal)   Pulse (!) 57   Ht 5\' 11"  (1.803 m)   Wt 212 lb 9.6 oz (96.4 kg)   SpO2 98%   BMI 29.65 kg/m     Wt Readings from Last 3 Encounters:  01/06/23 212 lb 9.6 oz (96.4 kg)  11/24/22 211 lb (95.7 kg)  11/06/22 209 lb 6.4  oz (95 kg)     GEN:  Well nourished, well developed in no acute distress HEENT: Normal NECK: No JVD; No carotid bruits CARDIAC: RRR, no murmurs, rubs, gallops RESPIRATORY:  Clear to auscultation without rales, wheezing or rhonchi  ABDOMEN: Soft, non-tender, non-distended MUSCULOSKELETAL:  No edema; No deformity  SKIN: Warm and dry NEUROLOGIC:  Alert and oriented x 3 PSYCHIATRIC:  Normal affect   ASSESSMENT:    1. Coronary artery disease involving native coronary artery of native heart without angina pectoris   2. Primary hypertension    PLAN:    In order of problems listed above:  CAD, RCA and LAD calcifications on chest CT. denies chest pain, echo 9/23 EF 60 to 65%, Lexiscan Myoview 10/23 no ischemia, low risk study.  Continue aspirin 81 mg, Pravachol 20 mg daily.  Hypertension, blood pressure controlled.  Continue Cardizem 120mg  every day, losartan 50 mg daily.    Follow-up in 1 year.   Medication Adjustments/Labs and Tests Ordered: Current medicines are reviewed at length with the patient today.  Concerns regarding medicines are outlined above.  No orders of the defined types were placed in this encounter.   No orders of the defined types were placed in this encounter.    Patient Instructions  Medication Instructions:   Your physician recommends that you continue on your current medications as directed. Please refer to the Current Medication list given to you today.  *If you need a refill on your cardiac medications before your next appointment, please call your pharmacy*   Lab Work:  None Ordered  If you have labs (blood work) drawn today and your tests are completely normal, you will receive your results only by: MyChart Message (if you have MyChart) OR A paper copy in the mail If you have any lab test that is abnormal or we need to change your treatment, we will call you to review the results.   Testing/Procedures:  None Ordered    Follow-Up: At  [facility], you and  your health needs are our priority.  As part of our continuing mission to provide you with exceptional heart care, we have created designated Provider Care Teams.  These Care Teams include your primary Cardiologist (physician) and Advanced Practice Providers (APPs -  Physician Assistants and Nurse Practitioners) who all work together to provide you with the care you need, when you need it.  We recommend signing up for the patient portal called "MyChart".  Sign up information is provided on this After Visit Summary.  MyChart is used to connect with patients for Virtual Visits (Telemedicine).  Patients are able to view lab/test results, encounter notes, upcoming appointments, etc.  Non-urgent messages can be sent to your provider as well.   To learn more about what you can do with MyChart, go to [email or web address].    Your next appointment:   12 month(s)  Provider:   You may see Jeffrey Odea, MD or one of the following Advanced Practice Providers on your designated Care Team:   Nicolasa Ducking, NP Eula Listen, PA-C Cadence Fransico Michael, PA-C Charlsie Quest, NP   Signed, Jeffrey Odea, MD  01/06/2023 8:54 AM    [facility]

## 2023-01-06 NOTE — Patient Instructions (Signed)
Medication Instructions:   Your physician recommends that you continue on your current medications as directed. Please refer to the Current Medication list given to you today.  *If you need a refill on your cardiac medications before your next appointment, please call your pharmacy*   Lab Work:  None Ordered  If you have labs (blood work) drawn today and your tests are completely normal, you will receive your results only by: MyChart Message (if you have MyChart) OR A paper copy in the mail If you have any lab test that is abnormal or we need to change your treatment, we will call you to review the results.   Testing/Procedures:  None Ordered    Follow-Up: At [facility], you and your health needs are our priority.  As part of our continuing mission to provide you with exceptional heart care, we have created designated Provider Care Teams.  These Care Teams include your primary Cardiologist (physician) and Advanced Practice Providers (APPs -  Physician Assistants and Nurse Practitioners) who all work together to provide you with the care you need, when you need it.  We recommend signing up for the patient portal called "MyChart".  Sign up information is provided on this After Visit Summary.  MyChart is used to connect with patients for Virtual Visits (Telemedicine).  Patients are able to view lab/test results, encounter notes, upcoming appointments, etc.  Non-urgent messages can be sent to your provider as well.   To learn more about what you can do with MyChart, go to [email or web address].    Your next appointment:   12 month(s)  Provider:   You may see Brian Agbor-Etang, MD or one of the following Advanced Practice Providers on your designated Care Team:   Christopher Berge, NP Ryan Dunn, PA-C Cadence Furth, PA-C Sheri Hammock, NP  

## 2023-01-27 ENCOUNTER — Other Ambulatory Visit: Payer: Self-pay | Admitting: Family Medicine

## 2023-01-27 DIAGNOSIS — Z86010 Personal history of colonic polyps: Secondary | ICD-10-CM

## 2023-01-27 DIAGNOSIS — Z1211 Encounter for screening for malignant neoplasm of colon: Secondary | ICD-10-CM

## 2023-02-01 ENCOUNTER — Encounter: Payer: Self-pay | Admitting: Pulmonary Disease

## 2023-02-01 ENCOUNTER — Ambulatory Visit: Payer: Medicare HMO | Admitting: Pulmonary Disease

## 2023-02-01 VITALS — BP 136/78 | HR 59 | Temp 97.9°F | Ht 71.0 in | Wt 216.0 lb

## 2023-02-01 DIAGNOSIS — J449 Chronic obstructive pulmonary disease, unspecified: Secondary | ICD-10-CM | POA: Diagnosis not present

## 2023-02-01 DIAGNOSIS — C3491 Malignant neoplasm of unspecified part of right bronchus or lung: Secondary | ICD-10-CM | POA: Diagnosis not present

## 2023-02-01 DIAGNOSIS — Z8521 Personal history of malignant neoplasm of larynx: Secondary | ICD-10-CM

## 2023-02-01 DIAGNOSIS — F17210 Nicotine dependence, cigarettes, uncomplicated: Secondary | ICD-10-CM

## 2023-02-01 MED ORDER — TRELEGY ELLIPTA 100-62.5-25 MCG/ACT IN AEPB: 1.0000 | INHALATION_SPRAY | Freq: Every day | RESPIRATORY_TRACT | Status: AC

## 2023-02-01 MED ORDER — TRELEGY ELLIPTA 100-62.5-25 MCG/ACT IN AEPB: 1.0000 | INHALATION_SPRAY | Freq: Every day | RESPIRATORY_TRACT | 11 refills | Status: DC

## 2023-02-01 NOTE — Patient Instructions (Addendum)
We have started you back on Trelegy Ellipta 1 inhalation daily.  Make sure you rinse your mouth well after use it.  Your next CT scan ordered by Dr. Orlie Dakin is on 23 September.  We will see her in follow-up in 4 to 6 months time call sooner should any new problems arise.

## 2023-02-01 NOTE — Progress Notes (Signed)
Subjective:    Patient ID: Jeffrey Torres, male    DOB: Mar 25, 1946, 77 y.o.   MRN: [id number]  Patient Care Team: Malva Limes, MD as PCP - General (Family Medicine) Debbe Odea, MD as PCP - Cardiology (Cardiology) Galen Manila, MD as Referring Physician (Ophthalmology) Bud Face, MD as Referring Physician (Otolaryngology) Carmina Miller, MD as Referring Physician (Radiation Oncology) Nicholaus Corolla, MD as Referring Physician (Ophthalmology) Glory Buff, RN as Oncology Nurse Navigator Salena Saner, MD as Consulting Physician (Pulmonary Disease) Kemper Durie, RN as [facility] Management  Chief Complaint  Patient presents with   Follow-up    DOE. Occasional wheezing at night. Dry cough.    HPI Patient is a 77 year old occasional smoker (51 PY) who presents for follow-up on the issue of right upper lobe non-small cell cancer of the lung, and COPD stage II.  Patient underwent right upper lobectomy on 28 May 2022 and pathology showed poorly differentiated carcinoma more consistent with a poorly differentiated squamous cell carcinoma.  Patient was last seen here on 24 September 2022 at that time he was doing well on Trelegy Ellipta and advised to continue.  Inexplicably, he discontinued the medication.  He is using albuterol once to twice a day which helps with his shortness of breath.  Overall he feels that his dyspnea is improved after his thoracotomy.  He does note wheezing more frequently since he discontinued Trelegy.  He has occasional dry cough, no hemoptysis.  He has not no fevers, chills or sweats since his last visit.  No cough or sputum production.  He does not endorse any weight loss or anorexia.  No chest pain, no orthopnea paroxysmal nocturnal dyspnea nor lower extremity edema.  No calf tenderness.   Patient does not endorse any other symptomatology today.  Overall he feels well and looks well.  He is to have follow-up CT chest  ordered by Dr. Orlie Dakin on 23 September.  DATA 02/12/2020 spirometry: FEV1 3.04 L or 86% predicted, FVC 3.55 L or 79% predicted, FEV1/FVC 86%.  No evidence of obstruction. 04/21/2022 spirometry: FEV1 2.76 L or 81% predicted FVC 3.41 L or 81% predicted FEV1/FVC 81%. 07/15/2022 spirometry: FEV1 1.46 L or 46% predicted, FVC 2.39 L or 54%, FEV1/FVC 61% 09/17/2022 full PFT: FEV1 2.33 L or 73% predicted FVC 3.03 L or 68% obstructed, FEV1/FVC 77%, lung volumes moderately reduced.  No bronchodilator response, mild diffusion capacity impairment.  Overall combined obstruction/restriction with "canceling out" effect.  Restriction due to prior thoracic surgery.   Review of Systems A 10 point review of systems was performed and it is as noted above otherwise negative.   Patient Active Problem List   Diagnosis Date Noted   Macular degeneration 11/24/2022   Elevated serum creatinine 07/03/2022   Prediabetes 07/03/2022   Anemia 07/03/2022   Syncope and collapse 06/23/2022   PAF (paroxysmal atrial fibrillation) (HCC) 06/23/2022   Chronic kidney disease, stage 3a (HCC) 06/23/2022   Lung cancer (HCC) 05/28/2022   Depression 04/22/2022   Right upper lobe pulmonary nodule 03/20/2022   Dizziness 03/14/2022   Hypokalemia 03/14/2022   Hypernatremia 07/31/2020   History of colonic polyps    Severe obstructive sleep apnea 02/21/2020   Primary hypertension 08/14/2019   Atrial bigeminy 08/14/2019   History of laryngeal cancer 12/28/2017   History of adenomatous polyp of colon 10/30/2015   Inguinal hernia, bilateral 10/21/2015   PVC (premature ventricular contraction) 10/21/2015   Tobacco dependence 04/19/2013   Umbilical hernia 07/20/2012  Social History   Tobacco Use   Smoking status: Every Day    Current packs/day: 1.00    Average packs/day: 1 pack/day for 51.6 years (51.6 ttl pk-yrs)    Types: Cigarettes    Start date: 1973   Smokeless tobacco: Never   Tobacco comments:    Smokes 3-4  cigarettes some days- 02/01/2023  Substance Use Topics   Alcohol use: Not Currently    Alcohol/week: 0.0 standard drinks of alcohol    Allergies  Allergen Reactions   Penicillins Anaphylaxis and Swelling    Did it involve swelling of the face/tongue/throat, SOB, or low BP? Yes Did it involve sudden or severe rash/hives, skin peeling, or any reaction on the inside of your mouth or nose? No Did you need to seek medical attention at a hospital or doctor's office? No When did it last happen?      10 + years ago If all above answers are "NO", may proceed with cephalosporin use.    Atorvastatin Other (See Comments)    myalgia   Trazodone And Nefazodone     Caused fainting spells    Current Meds  Medication Sig   albuterol (VENTOLIN HFA) 108 (90 Base) MCG/ACT inhaler Inhale 2 puffs into the lungs every 6 (six) hours as needed.   aspirin 81 MG tablet Take 81 mg by mouth daily.   diltiazem (CARDIZEM CD) 120 MG 24 hr capsule Take 1 capsule (120 mg total) by mouth daily.   fluticasone (FLONASE) 50 MCG/ACT nasal spray Place 2 sprays into both nostrils every morning.   losartan (COZAAR) 50 MG tablet Take 1 tablet (50 mg total) by mouth daily.   Multiple Vitamins-Minerals (PRESERVISION AREDS 2 PO) Take 2 capsules by mouth every morning.    sertraline (ZOLOFT) 100 MG tablet TAKE 1 TABLET BY MOUTH EVERY DAY    Immunization History  Administered Date(s) Administered   Fluad Quad(high Dose 65+) 08/07/2020, 03/18/2021, 03/17/2022   Influenza, High Dose Seasonal PF 03/25/2017, 03/29/2018, 04/24/2019   Pneumococcal Conjugate-13 04/26/2014   Pneumococcal Polysaccharide-23 05/28/2012   Tdap 11/27/2022   Zoster Recombinant(Shingrix) 11/27/2022        Objective:   BP 136/78 (BP Location: Left Arm, Cuff Size: Large)   Pulse (!) 59   Temp 97.9 F (36.6 C)   Ht 5\' 11"  (1.803 m)   Wt 216 lb (98 kg)   SpO2 96%   BMI 30.13 kg/m   SpO2: 96 % O2 Device: None (Room air)  GENERAL:  Well-developed, overweight gentleman in no acute distress, fully ambulatory, no conversational dyspnea. Fluent speech. HEAD: Normocephalic, atraumatic.  EYES: Pupils equal, round, reactive to light.  No scleral icterus.  MOUTH: Poor dentition, oral mucosa moist.  No thrush. NECK: Supple. No thyromegaly. Trachea midline. No JVD.  No adenopathy. PULMONARY: Symmetrical lung sounds good air entry bilaterally.  Few rhonchi noted bilaterally.  No wheezes.  CARDIOVASCULAR: S1 and S2.  Rate and rhythm.  No rubs, murmurs or gallops heard. ABDOMEN: Benign. MUSCULOSKELETAL: No joint deformity, no clubbing, no edema.  NEUROLOGIC: No overt focal deficit, no gait disturbance, speech is fluent. SKIN: Intact,warm,dry. PSYCH: Mood and behavior normal  Assessment & Plan:     ICD-10-CM   1. Stage 2 moderate COPD by GOLD classification (HCC)  J44.9    Poorly compensated Restart Trelegy 100, 1 inhalation daily Continue as needed albuterol    2. Non-small cell cancer of right lung [facility])  C34.91    Status post lobectomy 28 May 2022 No post  chemo/XRT required Follows with oncology Repeat CT scan 23 September per Dr. Orlie Dakin    3. History of laryngeal cancer  Z85.21    Stage I squamous cell carcinoma 2019 Received XRT only Follows with Dr. Orlie Dakin    4. Tobacco dependence due to cigarettes  F17.210    Still smoking 3 to 5 cigarettes on occasion Counseled with regards to discontinuation of smoking     Meds ordered this encounter  Medications   Fluticasone-Umeclidin-Vilant (TRELEGY ELLIPTA) 100-62.5-25 MCG/ACT AEPB    Sig: Inhale 1 puff into the lungs daily.    Order Specific Question:   Lot Number?    Answer:   [id number]    Order Specific Question:   Expiration Date?    Answer:   04/29/2024    Order Specific Question:   Quantity    Answer:   2   Fluticasone-Umeclidin-Vilant (TRELEGY ELLIPTA) 100-62.5-25 MCG/ACT AEPB    Sig: Inhale 1 puff into the lungs daily.    Dispense:  60 each    Refill:   11   Will reinstitute Trelegy Ellipta 100, 1 inhalation daily.  Will see the patient in follow-up in 4 to 6 months time   C. Danice Goltz, MD Advanced Bronchoscopy PCCM [facility]    *This note was dictated using voice recognition software/Dragon.  Despite best efforts to proofread, errors can occur which can change the meaning. Any transcriptional errors that result from this process are unintentional and may not be fully corrected at the time of dictation.

## 2023-02-08 ENCOUNTER — Encounter: Payer: Self-pay | Admitting: *Deleted

## 2023-02-22 ENCOUNTER — Other Ambulatory Visit: Payer: Self-pay

## 2023-02-22 MED ORDER — DILTIAZEM HCL ER COATED BEADS 120 MG PO CP24: 120.0000 mg | ORAL_CAPSULE | Freq: Every day | ORAL | 3 refills | Status: DC

## 2023-02-22 NOTE — Telephone Encounter (Signed)
Requested Prescriptions   Signed Prescriptions Disp Refills   diltiazem (CARDIZEM CD) 120 MG 24 hr capsule 90 capsule 3    Sig: Take 1 capsule (120 mg total) by mouth daily.    Authorizing Provider: Debbe Odea    Ordering User: Guerry Minors

## 2023-03-02 DIAGNOSIS — H353213 Exudative age-related macular degeneration, right eye, with inactive scar: Secondary | ICD-10-CM | POA: Diagnosis not present

## 2023-03-02 DIAGNOSIS — H353221 Exudative age-related macular degeneration, left eye, with active choroidal neovascularization: Secondary | ICD-10-CM | POA: Diagnosis not present

## 2023-03-02 DIAGNOSIS — H353190 Nonexudative age-related macular degeneration, unspecified eye, stage unspecified: Secondary | ICD-10-CM | POA: Diagnosis not present

## 2023-03-02 DIAGNOSIS — H35341 Macular cyst, hole, or pseudohole, right eye: Secondary | ICD-10-CM | POA: Diagnosis not present

## 2023-03-22 ENCOUNTER — Ambulatory Visit
Admission: RE | Admit: 2023-03-22 | Discharge: 2023-03-22 | Disposition: A | Discharge status: Home / Self Care | Payer: Medicare HMO | Source: Ambulatory Visit | Attending: Oncology | Admitting: Oncology

## 2023-03-22 DIAGNOSIS — C3490 Malignant neoplasm of unspecified part of unspecified bronchus or lung: Secondary | ICD-10-CM | POA: Diagnosis not present

## 2023-03-22 DIAGNOSIS — I700 Atherosclerosis of aorta: Secondary | ICD-10-CM | POA: Diagnosis not present

## 2023-03-22 MED ORDER — IOHEXOL 300 MG/ML  SOLN
75.0000 mL | Freq: Once | INTRAMUSCULAR | Status: AC | PRN
  Administered 2023-03-22: 75 mL via INTRAVENOUS

## 2023-03-25 ENCOUNTER — Inpatient Hospital Stay: Payer: Medicare HMO | Admitting: Oncology

## 2023-03-28 ENCOUNTER — Other Ambulatory Visit: Payer: Self-pay | Admitting: Family Medicine

## 2023-03-28 DIAGNOSIS — E876 Hypokalemia: Secondary | ICD-10-CM

## 2023-03-29 ENCOUNTER — Encounter: Payer: Self-pay | Admitting: Oncology

## 2023-03-29 ENCOUNTER — Inpatient Hospital Stay: Payer: Medicare HMO | Attending: Oncology | Admitting: Oncology

## 2023-03-29 VITALS — BP 166/81 | HR 54 | Temp 97.9°F | Resp 16 | Ht 71.0 in | Wt 220.0 lb

## 2023-03-29 DIAGNOSIS — Z801 Family history of malignant neoplasm of trachea, bronchus and lung: Secondary | ICD-10-CM | POA: Diagnosis not present

## 2023-03-29 DIAGNOSIS — Z8616 Personal history of COVID-19: Secondary | ICD-10-CM | POA: Insufficient documentation

## 2023-03-29 DIAGNOSIS — C3490 Malignant neoplasm of unspecified part of unspecified bronchus or lung: Secondary | ICD-10-CM

## 2023-03-29 DIAGNOSIS — Z85118 Personal history of other malignant neoplasm of bronchus and lung: Secondary | ICD-10-CM | POA: Diagnosis not present

## 2023-03-29 DIAGNOSIS — Z8521 Personal history of malignant neoplasm of larynx: Secondary | ICD-10-CM | POA: Diagnosis not present

## 2023-03-29 DIAGNOSIS — F17210 Nicotine dependence, cigarettes, uncomplicated: Secondary | ICD-10-CM | POA: Insufficient documentation

## 2023-03-29 DIAGNOSIS — I519 Heart disease, unspecified: Secondary | ICD-10-CM | POA: Diagnosis not present

## 2023-03-29 NOTE — Progress Notes (Signed)
[facility]  Telephone:(336) [phone] Fax:(336) [phone]  ID: Jeffrey Torres OB: 09-01-1945  MR#: [id number]  AOZ#:[id number]  Patient Care Team: Malva Limes, MD as PCP - General (Family Medicine) Debbe Odea, MD as PCP - Cardiology (Cardiology) Galen Manila, MD as Referring Physician (Ophthalmology) Bud Face, MD as Referring Physician (Otolaryngology) Carmina Miller, MD as Referring Physician (Radiation Oncology) Nicholaus Corolla, MD as Referring Physician (Ophthalmology) Glory Buff, RN as Oncology Nurse Navigator Salena Saner, MD as Consulting Physician (Pulmonary Disease) Kemper Durie, RN as [facility] Care Management  CHIEF COMPLAINT: Stage Ib adenocarcinoma of the lung.  INTERVAL HISTORY: Patient returns to clinic today for routine 21-month evaluation and discussion of his imaging results.  He currently feels well and is asymptomatic.  He has no neurologic complaints.  He denies any recent fevers or illnesses.  He has a good appetite and denies weight loss.  He has no chest pain, shortness of breath, cough, or hemoptysis.  He denies any nausea, vomiting, constipation, or diarrhea.  He has no urinary complaints.  Patient offers no specific complaints today.  REVIEW OF SYSTEMS:   Review of Systems  Constitutional: Negative.  Negative for fever, malaise/fatigue and weight loss.  Respiratory: Negative.  Negative for cough, hemoptysis and shortness of breath.   Cardiovascular: Negative.  Negative for chest pain and leg swelling.  Gastrointestinal: Negative.  Negative for abdominal pain.  Genitourinary: Negative.  Negative for dysuria.  Musculoskeletal: Negative.  Negative for back pain.  Skin: Negative.  Negative for rash.  Neurological: Negative.  Negative for dizziness, focal weakness, weakness and headaches.  Psychiatric/Behavioral: Negative.  The patient is not nervous/anxious.     As per HPI. Otherwise, a complete  review of systems is negative.  PAST MEDICAL HISTORY: Past Medical History:  Diagnosis Date   Acute metabolic encephalopathy 07/31/2020   Anxiety    Arthritis    Atrial fibrillation (HCC)    Cancer (HCC)    larynx ca, surgery and radiation   GERD (gastroesophageal reflux disease)    Heart murmur    Hernia, inguinal, bilateral    History of chicken pox    History of kidney stones    History of measles    History of mumps    History of shingles    HOH (hard of hearing)    Hypertension    Macular degeneration of both eyes    Motion sickness    ocean boat   Pneumonia due to COVID-19 virus 07/31/2020   PVC (premature ventricular contraction)     PAST SURGICAL HISTORY: Past Surgical History:  Procedure Laterality Date   CARDIAC CATHETERIZATION  2010   normal per patient report   CATARACT EXTRACTION W/PHACO Right 07/18/2019   Procedure: CATARACT EXTRACTION PHACO AND INTRAOCULAR LENS PLACEMENT (IOC) RIGHT 4.54 00:29.7;  Surgeon: Galen Manila, MD;  Location: [facility];  Service: Ophthalmology;  Laterality: Right;   CATARACT EXTRACTION W/PHACO Left 08/08/2019   Procedure: CATARACT EXTRACTION PHACO AND INTRAOCULAR LENS PLACEMENT (IOC) LEFT;  Surgeon: Galen Manila, MD;  Location: [facility] ORS;  Service: Ophthalmology;  Laterality: Left;  Lot #[id number] H [place]: 00:34.5 CDE: 4.75   COLONOSCOPY  06/24/2006   Dr. Lemar Livings. Multiple benign appearing 5mm polyps in the cecum and in the rectum. -Three 8mm polyps in the transversed colon, in the tranverse colon, proximal and in the distal transverse colon. Resected and retrieved.   COLONOSCOPY WITH PROPOFOL N/A 03/12/2020   Procedure: COLONOSCOPY WITH PROPOFOL;  Surgeon: Midge Minium, MD;  Location: [facility];  Service: Endoscopy;  Laterality: N/A;   CYSTOSCOPY WITH HOLMIUM LASER LITHOTRIPSY  2001   kidney stones removed, [facility]   HERNIA REPAIR     umbilical and bilateral inguninal   INTERCOSTAL NERVE BLOCK  05/28/2022    Procedure: INTERCOSTAL NERVE BLOCK;  Surgeon: Loreli Slot, MD;  Location[facility];  Service: Thoracic;;   LARYNGOSCOPY Right 04/01/2017   Procedure: SUSPENSION LARYNGOSCOPY WITH MICROFLAP EXCISION;  Surgeon: Bud Face, MD;  Location: [facility] ORS;  Service: ENT;  Laterality: Right;   LYMPH NODE DISSECTION  05/28/2022   Procedure: LYMPH NODE DISSECTION;  Surgeon: Loreli Slot, MD;  Location: [facility] OR;  Service: Thoracic;;   sinus surgery   2002   [facility]; removal of polyps   TONSILLECTOMY  1954   Tubular adenoma removed  06/24/2006    FAMILY HISTORY: Family History  Problem Relation Age of Onset   Lung cancer Mother    Heart disease Father     ADVANCED DIRECTIVES (Y/N):  N  HEALTH MAINTENANCE: Social History   Tobacco Use   Smoking status: Every Day    Current packs/day: 1.00    Average packs/day: 1 pack/day for 51.7 years (51.7 ttl pk-yrs)    Types: Cigarettes    Start date: 1973   Smokeless tobacco: Never   Tobacco comments:    Smokes 3-4 cigarettes some days- 02/01/2023  Vaping Use   Vaping status: Never Used  Substance Use Topics   Alcohol use: Not Currently    Alcohol/week: 0.0 standard drinks of alcohol   Drug use: No     Colonoscopy:  PAP:  Bone density:  Lipid panel:  Allergies  Allergen Reactions   Penicillins Anaphylaxis and Swelling    Did it involve swelling of the face/tongue/throat, SOB, or low BP? Yes Did it involve sudden or severe rash/hives, skin peeling, or any reaction on the inside of your mouth or nose? No Did you need to seek medical attention at a hospital or doctor's office? No When did it last happen?      10 + years ago If all above answers are "NO", may proceed with cephalosporin use.    Atorvastatin Other (See Comments)    myalgia   Trazodone And Nefazodone     Caused fainting spells    Current Outpatient Medications  Medication Sig Dispense Refill   albuterol (VENTOLIN HFA) 108 (90 Base) MCG/ACT inhaler  Inhale 2 puffs into the lungs every 6 (six) hours as needed. 18 g 2   aspirin 81 MG tablet Take 81 mg by mouth daily.     diltiazem (CARDIZEM CD) 120 MG 24 hr capsule Take 1 capsule (120 mg total) by mouth daily. 90 capsule 3   fluticasone (FLONASE) 50 MCG/ACT nasal spray Place 2 sprays into both nostrils every morning.     Fluticasone-Umeclidin-Vilant (TRELEGY ELLIPTA) 100-62.5-25 MCG/ACT AEPB Inhale 1 puff into the lungs daily.     Fluticasone-Umeclidin-Vilant (TRELEGY ELLIPTA) 100-62.5-25 MCG/ACT AEPB Inhale 1 puff into the lungs daily. 60 each 11   losartan (COZAAR) 50 MG tablet Take 1 tablet (50 mg total) by mouth daily. 90 tablet 3   Multiple Vitamins-Minerals (PRESERVISION AREDS 2 PO) Take 2 capsules by mouth every morning.      sertraline (ZOLOFT) 100 MG tablet TAKE 1 TABLET BY MOUTH EVERY DAY 90 tablet 4   No current facility-administered medications for this visit.    OBJECTIVE: Vitals:   03/29/23 1307  BP: (!) 166/81  Pulse: (!) 54  Resp: 16  Temp: 97.9 F (36.6 C)  SpO2: 99%     Body mass index is 30.68 kg/m.    ECOG FS:0 - Asymptomatic  General: Well-developed, well-nourished, no acute distress. Eyes: Pink conjunctiva, anicteric sclera. HEENT: Normocephalic, moist mucous membranes. Lungs: No audible wheezing or coughing. Heart: Regular rate and rhythm. Abdomen: Soft, nontender, no obvious distention. Musculoskeletal: No edema, cyanosis, or clubbing. Neuro: Alert, answering all questions appropriately. Cranial nerves grossly intact. Skin: No rashes or petechiae noted. Psych: Normal affect.  LAB RESULTS:  Lab Results  Component Value Date   NA 141 11/24/2022   K 4.2 11/24/2022   CL 103 11/24/2022   CO2 22 11/24/2022   GLUCOSE 98 11/24/2022   BUN 14 11/24/2022   CREATININE 1.17 11/24/2022   CALCIUM 9.0 11/24/2022   PROT 6.6 11/24/2022   ALBUMIN 4.3 11/24/2022   AST 23 11/24/2022   ALT 17 11/24/2022   ALKPHOS 64 11/24/2022   BILITOT 0.3 11/24/2022    GFRNONAA >60 06/24/2022   GFRAA 86 08/07/2020    Lab Results  Component Value Date   WBC 7.9 07/03/2022   NEUTROABS 5.3 07/07/2020   HGB 14.4 07/03/2022   HCT 43.1 07/03/2022   MCV 92 07/03/2022   PLT 272 07/03/2022     STUDIES: CT Chest W Contrast  Result Date: 03/29/2023 CLINICAL DATA:  Restaging lung cancer. Previous right lung partial resection. * Tracking Code: BO * EXAM: CT CHEST WITH CONTRAST TECHNIQUE: Multidetector CT imaging of the chest was performed during intravenous contrast administration. RADIATION DOSE REDUCTION: This exam was performed according to the departmental dose-optimization program which includes automated exposure control, adjustment of the mA and/or kV according to patient size and/or use of iterative reconstruction technique. CONTRAST:  75mL OMNIPAQUE IOHEXOL 300 MG/ML  SOLN COMPARISON:  Chest CT 09/07/2022 and 06/23/2022. FINDINGS: Cardiovascular: Atherosclerosis of the aorta, great vessels and coronary arteries. No acute vascular findings. The heart size is normal. There is no pericardial effusion. Mediastinum/Nodes: There are no enlarged mediastinal, hilar or axillary lymph nodes. The thyroid gland, trachea and esophagus demonstrate no significant findings. Lungs/Pleura: No pleural effusion or pneumothorax. Stable postsurgical changes from previous right upper lobectomy and right middle lobe wedge resection. 6 mm solid right lower lobe nodule on image 105/3 is unchanged. Additional scattered small pulmonary nodules are stable. There are no new, enlarging or suspicious nodules. Scattered pulmonary scarring bilaterally. Upper abdomen: No acute findings are seen in the visualized upper abdomen. Possible small calcified gallstone, incompletely visualized. Small lymph nodes in the porta hepatis appear unchanged. Musculoskeletal/Chest wall: There is no chest wall mass or suspicious osseous finding. Unless specific follow-up recommendations are mentioned in the findings or  impression sections, no imaging follow-up of any mentioned incidental findings is recommended. IMPRESSION: 1. Stable postoperative chest CT status post right upper lobectomy and right middle lobe wedge resection. 2. No evidence of local recurrence or metastatic disease. 3. Stable scattered small pulmonary nodules bilaterally from baseline study of 03/15/2022, most consistent with benign findings. Continued CT follow-up recommended. 4. Possible small gallstone. 5.  Aortic Atherosclerosis (ICD10-I70.0). Electronically Signed   By: Carey Bullocks M.D.   On: 03/29/2023 13:37    ASSESSMENT: Stage Ib adenocarcinoma of the lung.  PLAN:    Stage Ib adenocarcinoma of the lung: Patient underwent surgical resection on May 28, 2022.  Patient has slight invasion of pleura, but no metastatic disease noted in lymph nodes.  He did not require adjuvant XRT or chemotherapy.  His most recent  imaging on March 29, 2023 reviewed independently and reported as above with no obvious evidence of recurrent or progressive disease.  No intervention is needed at this time.  Return to clinic in 6 months with repeat imaging and further evaluation.    Laryngeal cancer: Patient diagnosed with a stage I squamous cell carcinoma in 2019 that was treated with XRT only.  Continue follow-up with ENT as needed.   Cardiac disease: Continue close follow-up with cardiology.  I spent a total of 20 minutes reviewing chart data, face-to-face evaluation with the patient, counseling and coordination of care as detailed above.    Patient expressed understanding and was in agreement with this plan. He also understands that He can call clinic at any time with any questions, concerns, or complaints.    Cancer Staging  Lung cancer [facility]) Staging form: Lung, AJCC 8th Edition - Clinical stage from 06/11/2022: Stage IB (cT2a, cN0, cM0) - Signed by Jeralyn Ruths, MD on 06/11/2022 Stage prefix: Initial diagnosis   Jeralyn Ruths, MD    03/29/2023 4:20 PM

## 2023-04-08 DIAGNOSIS — R7303 Prediabetes: Secondary | ICD-10-CM | POA: Diagnosis not present

## 2023-04-08 DIAGNOSIS — H5360 Unspecified night blindness: Secondary | ICD-10-CM | POA: Diagnosis not present

## 2023-04-08 DIAGNOSIS — K219 Gastro-esophageal reflux disease without esophagitis: Secondary | ICD-10-CM | POA: Diagnosis not present

## 2023-04-08 DIAGNOSIS — E669 Obesity, unspecified: Secondary | ICD-10-CM | POA: Diagnosis not present

## 2023-04-08 DIAGNOSIS — I129 Hypertensive chronic kidney disease with stage 1 through stage 4 chronic kidney disease, or unspecified chronic kidney disease: Secondary | ICD-10-CM | POA: Diagnosis not present

## 2023-04-08 DIAGNOSIS — I2510 Atherosclerotic heart disease of native coronary artery without angina pectoris: Secondary | ICD-10-CM | POA: Diagnosis not present

## 2023-04-08 DIAGNOSIS — N529 Male erectile dysfunction, unspecified: Secondary | ICD-10-CM | POA: Diagnosis not present

## 2023-04-08 DIAGNOSIS — N182 Chronic kidney disease, stage 2 (mild): Secondary | ICD-10-CM | POA: Diagnosis not present

## 2023-04-08 DIAGNOSIS — M1990 Unspecified osteoarthritis, unspecified site: Secondary | ICD-10-CM | POA: Diagnosis not present

## 2023-04-08 DIAGNOSIS — D6869 Other thrombophilia: Secondary | ICD-10-CM | POA: Diagnosis not present

## 2023-04-08 DIAGNOSIS — Z8249 Family history of ischemic heart disease and other diseases of the circulatory system: Secondary | ICD-10-CM | POA: Diagnosis not present

## 2023-04-08 DIAGNOSIS — I4891 Unspecified atrial fibrillation: Secondary | ICD-10-CM | POA: Diagnosis not present

## 2023-06-27 ENCOUNTER — Other Ambulatory Visit: Payer: Self-pay | Admitting: Family Medicine

## 2023-06-27 DIAGNOSIS — E876 Hypokalemia: Secondary | ICD-10-CM

## 2023-08-30 ENCOUNTER — Ambulatory Visit: Admission: RE | Admit: 2023-08-30 | Payer: Medicare HMO | Source: Ambulatory Visit

## 2023-10-07 ENCOUNTER — Telehealth: Payer: Self-pay | Admitting: Oncology

## 2023-10-07 ENCOUNTER — Inpatient Hospital Stay: Payer: Medicare HMO | Admitting: Oncology

## 2023-10-07 NOTE — Telephone Encounter (Signed)
 Left VM for patient to call us back. He no-showed for his CT scan on 3/3 and per RN appointment with Dr. Orlie Dakin this afternoon needs to be cancelled due to not having the results of this imaging.

## 2023-11-04 ENCOUNTER — Other Ambulatory Visit: Payer: Self-pay

## 2023-11-04 MED ORDER — LOSARTAN POTASSIUM 50 MG PO TABS: 50.0000 mg | ORAL_TABLET | Freq: Every day | ORAL | 3 refills | Status: DC

## 2023-11-17 ENCOUNTER — Other Ambulatory Visit: Payer: Self-pay | Admitting: Family Medicine

## 2023-12-13 ENCOUNTER — Other Ambulatory Visit: Payer: Self-pay | Admitting: Family Medicine

## 2023-12-17 ENCOUNTER — Other Ambulatory Visit: Payer: Self-pay | Admitting: Family Medicine

## 2024-01-11 DIAGNOSIS — C009 Malignant neoplasm of lip, unspecified: Secondary | ICD-10-CM | POA: Diagnosis not present

## 2024-01-16 ENCOUNTER — Other Ambulatory Visit: Payer: Self-pay | Admitting: Family Medicine

## 2024-02-10 ENCOUNTER — Other Ambulatory Visit: Payer: Self-pay | Admitting: Family Medicine

## 2024-02-10 NOTE — Telephone Encounter (Signed)
 Patient has not been seen in over a year. Please tell them to schedule office visit for refills.

## 2024-02-12 ENCOUNTER — Other Ambulatory Visit: Payer: Self-pay | Admitting: Family Medicine

## 2024-02-17 ENCOUNTER — Ambulatory Visit: Attending: Nurse Practitioner | Admitting: Physician Assistant

## 2024-02-17 ENCOUNTER — Encounter: Payer: Self-pay | Admitting: Nurse Practitioner

## 2024-02-17 VITALS — BP 158/72 | HR 51 | Ht 70.0 in | Wt 198.4 lb

## 2024-02-17 DIAGNOSIS — I10 Essential (primary) hypertension: Secondary | ICD-10-CM | POA: Diagnosis not present

## 2024-02-17 DIAGNOSIS — I2510 Atherosclerotic heart disease of native coronary artery without angina pectoris: Secondary | ICD-10-CM

## 2024-02-17 MED ORDER — LOSARTAN POTASSIUM 100 MG PO TABS: 100.0000 mg | ORAL_TABLET | Freq: Every day | ORAL | 0 refills | Status: DC

## 2024-02-17 NOTE — Patient Instructions (Addendum)
 Medication Instructions:  INCREASE Losartan  100 mg once daily  *If you need a refill on your cardiac medications before your next appointment, please call your pharmacy*  Lab Work: Your provider would like for you to return in 2 weeks to have the following labs drawn: BMET.   Please go to [facility] [address] ([facility] [address], [place] [address] You do not need an appointment.  They are open from 8 am- 4:30 pm.  Lunch from 1:00 pm- 2:00 pm You will not need to be fasting.   You may also go to one of the following [facility]:  [address] [place], [place] [address] Phone: [phone] Lab hours: Mon-Fri 8 am- 5 pm    Lunch 12 pm- 1 pm  [address] [place],  [place]  [address]  [place]  Phone: [phone] Lab hours: 7 am- 4 pm Lunch 12 pm-1 pm   [address] [place],  [place]  [address]  [place]  Phone: [phone] Lab hours: Mon-Fri 8 am- 5 pm    Lunch 12 pm- 1 pm  If you have labs (blood work) drawn today and your tests are completely normal, you will receive your results only by: MyChart Message (if you have MyChart) OR A paper copy in the mail If you have any lab test that is abnormal or we need to change your treatment, we will call you to review the results.  Testing/Procedures: None ordered  Follow-Up: At [facility], you and your health needs are our priority.  As part of our continuing mission to provide you with exceptional heart care, our providers are all part of one team.  This team includes your primary Cardiologist (physician) and Advanced Practice Providers or APPs (Physician Assistants and Nurse Practitioners) who all work together to provide you with the care you need, when you need it.  Your next appointment:   3 month(s)  Provider:   You may see Redell Cave, MD or one of the following Advanced Practice Providers on your designated Care Team:   Lesley Maffucci, PA-C   We recommend signing up for the patient  portal called MyChart.  Sign up information is provided on this After Visit Summary.  MyChart is used to connect with patients for Virtual Visits (Telemedicine).  Patients are able to view lab/test results, encounter notes, upcoming appointments, etc.  Non-urgent messages can be sent to your provider as well.   To learn more about what you can do with MyChart, go to [email or web address].

## 2024-02-17 NOTE — Progress Notes (Signed)
 Cardiology Office Note    Date:  02/17/2024   ID:  Jeffrey, Torres 1946-03-12, MRN [id number]  PCP:  [name], MD  Cardiologist:  Redell Cave, MD  Electrophysiologist:  None   Chief Complaint: Follow up  History of Present Illness:   Jeffrey Torres is a 78 y.o. male with history of CAD (LAD and RCA calcifications on chest CT 03/2022), hypertension, OSA, former smoker x 35+ years, COPD, non-small cell R lung cancer s/p surgical resection who presents for follow up on CAD and hypertension.    Echo completed 02/2022 showed normal systolic function with EF 60 to 65%, chest CT 02/2022 showed LAD and RCA calcifications.  Lexiscan  Myoview  completed 03/2022 revealed no significant ischemia and overall a low risk study with normal EF.   Patient was most recently seen 12/2022 overall doing well from a cardiac perspective.  Blood pressure was well-controlled.  No further testing or medication changes were indicated at that time.  Patient presents today overall doing well from a cardiac perspective.  He reports 2 weeks dyspnea and wheezing for which he was using inhalers prescribed by his PCP with improvement.  Symptoms have resolved over the past 2 to 3 days.  He does not consistently take his blood pressure at home.  Reports the batteries have been dead in his For quite some time.  He is without symptoms of angina and cardiac decompensation.  He denies chest pain, palpitations, lightheadedness, dizziness, and lower extremity swelling.  Labs independently reviewed: 10/2022 BUN 14, creatinine 1.17, sodium 141, potassium 4.2, normal LFTs, normal TSH/T4, TC 136, TG 104, HDL 39, LDL 78  Objective   Past Medical History:  Diagnosis Date   Acute metabolic encephalopathy 07/31/2020   Anxiety    Arthritis    Atrial fibrillation (HCC)    CAD (coronary artery disease)    a. 02/2022 Chest CT: LAD/RCA Ca2+; b. 03/2022 Lexi MV: no ischemia/infarct; c. 02/2022 Echo: EF 60-65%, AoV sclerosis.    Cancer [facility])    larynx ca, surgery and radiation   COPD (chronic obstructive pulmonary disease) (HCC)    GERD (gastroesophageal reflux disease)    Heart murmur    a. 02/2022 Echo: AoV sclerosis.   Hernia, inguinal, bilateral    History of chicken pox    History of kidney stones    History of measles    History of mumps    History of shingles    History of tobacco abuse    HOH (hard of hearing)    Hypertension    Macular degeneration of both eyes    Motion sickness    ocean boat   OSA (obstructive sleep apnea)    a. Does not tolerate CPAP.   Pneumonia due to COVID-19 virus 07/31/2020   PVC (premature ventricular contraction)     Current Medications: Current Meds  Medication Sig   albuterol  (VENTOLIN  HFA) 108 (90 Base) MCG/ACT inhaler Inhale 2 puffs into the lungs every 6 (six) hours as needed.   aspirin  81 MG tablet Take 81 mg by mouth daily.   diltiazem  (CARDIZEM  CD) 120 MG 24 hr capsule Take 1 capsule (120 mg total) by mouth daily.   fluticasone  (FLONASE ) 50 MCG/ACT nasal spray Place 2 sprays into both nostrils every morning.   Fluticasone -Umeclidin-Vilant (TRELEGY ELLIPTA ) 100-62.5-25 MCG/ACT AEPB Inhale 1 puff into the lungs daily.   sertraline  (ZOLOFT ) 100 MG tablet TAKE 1 TABLET BY MOUTH EVERY DAY   [DISCONTINUED] losartan  (COZAAR ) 50 MG tablet Take  1 tablet (50 mg total) by mouth daily.    Allergies:   Penicillins, Atorvastatin, and Trazodone  and nefazodone   Social History   Socioeconomic History   Marital status: Divorced    Spouse name: Not on file   Number of children: 2   Years of education: Not on file   Highest education level: 12th grade  Occupational History   Occupation: Truck driver    Comment: unemployed  Tobacco Use   Smoking status: Every Day    Current packs/day: 1.00    Average packs/day: 1 pack/day for 52.6 years (52.6 ttl pk-yrs)    Types: Cigarettes    Start date: 1973   Smokeless tobacco: Never   Tobacco comments:    Smokes 3-4  cigarettes some days- 02/01/2023  Vaping Use   Vaping status: Never Used  Substance and Sexual Activity   Alcohol use: Not Currently    Alcohol/week: 0.0 standard drinks of alcohol   Drug use: No   Sexual activity: Not on file  Other Topics Concern   Not on file  Social History Narrative   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   Social Drivers of Health   Financial Resource Strain: Medium Risk (07/23/2020)   Overall Financial Resource Strain (CARDIA)    Difficulty of Paying Living Expenses: Somewhat hard  Food Insecurity: No Food Insecurity (06/01/2022)   Hunger Vital Sign    Worried About Running Out of Food in the Last Year: Never true    Ran Out of Food in the Last Year: Never true  Transportation Needs: No Transportation Needs (06/01/2022)   PRAPARE - Administrator, Civil Service (Medical): No    Lack of Transportation (Non-Medical): No  Physical Activity: Inactive (07/23/2020)   Exercise Vital Sign    Days of Exercise per Week: 0 days    Minutes of Exercise per Session: 0 min  Stress: No Stress Concern Present (07/23/2020)   [employer] of Occupational Health - Occupational Stress Questionnaire    Feeling of Stress : Not at all  Social Connections: Socially Isolated (07/23/2020)   Social Connection and Isolation Panel    Frequency of Communication with Friends and Family: More than three times a week    Frequency of Social Gatherings with Friends and Family: More than three times a week    Attends Religious Services: Never    Database administrator or Organizations: No    Attends Banker Meetings: Never    Marital Status: Separated     Family History:  The patient's family history includes Heart disease in his father; Lung cancer in his mother.  ROS:   12-point review of systems is negative unless otherwise noted in the HPI.  EKGs/Other Studies Reviewed:    Studies reviewed were summarized above. The additional studies were reviewed  today:  04/23/2022 Lexiscan  myoview  Pharmacological myocardial perfusion imaging study with no significant  ischemia Normal wall motion, EF estimated at 65% No EKG changes concerning for ischemia at peak stress or in recovery. CT attenuation correction with mild three-vessel coronary calcification and aortic atherosclerosis Low risk scan  03/15/2022 Echo complete 1. Left ventricular ejection fraction, by estimation, is 60 to 65%. The  left ventricle has normal function. The left ventricle has no regional  wall motion abnormalities. Left ventricular diastolic parameters are  indeterminate.   2. Right ventricular systolic function is normal. The right ventricular  size is normal. There is normal pulmonary artery systolic pressure. The  estimated right ventricular  systolic pressure is 25.1 mmHg.   3. The mitral valve is normal in structure. No evidence of mitral valve  regurgitation. No evidence of mitral stenosis.   4. The aortic valve is tricuspid. Aortic valve regurgitation is not  visualized. Aortic valve sclerosis/calcification is present, without any  evidence of aortic stenosis.   5. The inferior vena cava is normal in size with greater than 50%  respiratory variability, suggesting right atrial pressure of 3 mmHg.    EKG:  EKG personally reviewed by me today EKG Interpretation Date/Time:  Thursday February 17 2024 10:14:17 EDT Ventricular Rate:  51 PR Interval:  170 QRS Duration:  84 QT Interval:  444 QTC Calculation: 409 R Axis:   -2  Text Interpretation: Sinus bradycardia with Premature atrial complexes When compared with ECG of 22-Jun-2022 23:40, PREVIOUS ECG IS PRESENT Confirmed by Lorene Sinclair ([id number]) on 02/17/2024 10:15:51 AM  PHYSICAL EXAM:    VS:  BP (!) 158/72 (BP Location: Left Arm, Patient Position: Sitting, Cuff Size: Large)   Pulse (!) 51   Ht 5' 10 (1.778 m)   Wt 198 lb 6 oz (90 kg)   SpO2 98%   BMI 28.46 kg/m   BMI: Body mass index is 28.46 kg/m.  GEN:  Well nourished, well developed in no acute distress NECK: No JVD; No carotid bruits CARDIAC: RRR, no murmurs, rubs, gallops RESPIRATORY:  Clear to auscultation without rales, wheezing or rhonchi  ABDOMEN: Soft, non-tender, non-distended EXTREMITIES: No edema; No deformity  Wt Readings from Last 3 Encounters:  02/17/24 198 lb 6 oz (90 kg)  03/29/23 220 lb (99.8 kg)  02/01/23 216 lb (98 kg)       ASSESSMENT & PLAN:   Coronary artery disease - RCA and LAD calcifications noted on chest CT.  Lexiscan  Myoview  03/2022 overall low risk with no evidence of ischemia.  Echo 02/2022 with preserved LV systolic function.  No chest pain.  No indication for further testing at this time.  Hypertension - Blood pressure is moderately elevated today in past readings.  Continue Cardizem  120 mg daily.  Recommend increasing losartan  to 100 mg daily.  Check BMP in 1 to 2 weeks.  Recommend patient keep a blood pressure log for the next 2 weeks and reporting values back to us .   Disposition: Increase losartan  to 100 mg daily.  Recheck BMP in 1 to 2 weeks.  F/u with Dr. Darliss or an APP in 3 months.   Medication Adjustments/Labs and Tests Ordered: Current medicines are reviewed at length with the patient today.  Concerns regarding medicines are outlined above. Medication changes, Labs and Tests ordered today are summarized above and listed in the Patient Instructions accessible in Encounters.   Bonney Sinclair Lorene, PA-C 02/17/2024 10:39 AM     [facility] [place] [address] [place], [place] [address] [phone]

## 2024-02-23 ENCOUNTER — Other Ambulatory Visit: Payer: Self-pay | Admitting: Family Medicine

## 2024-02-25 DIAGNOSIS — Z008 Encounter for other general examination: Secondary | ICD-10-CM | POA: Diagnosis not present

## 2024-02-26 ENCOUNTER — Other Ambulatory Visit: Payer: Self-pay | Admitting: Cardiology

## 2024-03-01 DIAGNOSIS — I10 Essential (primary) hypertension: Secondary | ICD-10-CM | POA: Diagnosis not present

## 2024-03-02 ENCOUNTER — Ambulatory Visit: Payer: Self-pay | Admitting: Physician Assistant

## 2024-03-02 LAB — BASIC METABOLIC PANEL WITH GFR
BUN/Creatinine Ratio: 13 (ref 10–24)
BUN: 17 mg/dL (ref 8–27)
CO2: 23 mmol/L (ref 20–29)
Calcium: 9.2 mg/dL (ref 8.6–10.2)
Chloride: 103 mmol/L (ref 96–106)
Creatinine, Ser: 1.26 mg/dL (ref 0.76–1.27)
Glucose: 98 mg/dL (ref 70–99)
Potassium: 4.1 mmol/L (ref 3.5–5.2)
Sodium: 142 mmol/L (ref 134–144)
eGFR: 59 mL/min/1.73 — ABNORMAL LOW (ref >59)

## 2024-03-02 NOTE — Progress Notes (Signed)
 Letter Sent.

## 2024-03-05 ENCOUNTER — Other Ambulatory Visit: Payer: Self-pay | Admitting: Family Medicine

## 2024-03-31 ENCOUNTER — Other Ambulatory Visit: Payer: Self-pay | Admitting: Family Medicine

## 2024-04-07 ENCOUNTER — Encounter: Admitting: Family Medicine

## 2024-04-13 NOTE — Progress Notes (Signed)
 SHALIK SANFILIPPO                                          MRN: [id number]   04/13/2024   The [facility] Quality Team Specialist reviewed this patient medical record for the purposes of chart review for care gap closure. The following were reviewed: chart review for care gap closure-controlling blood pressure.    [facility]BCI Quality Team

## 2024-04-19 ENCOUNTER — Encounter: Payer: Self-pay | Admitting: Family Medicine

## 2024-04-19 ENCOUNTER — Ambulatory Visit (INDEPENDENT_AMBULATORY_CARE_PROVIDER_SITE_OTHER): Admitting: Family Medicine

## 2024-04-19 VITALS — BP 138/73 | HR 56 | Resp 16 | Ht 71.0 in | Wt 203.5 lb

## 2024-04-19 DIAGNOSIS — F32A Depression, unspecified: Secondary | ICD-10-CM | POA: Diagnosis not present

## 2024-04-19 DIAGNOSIS — Z860101 Personal history of adenomatous and serrated colon polyps: Secondary | ICD-10-CM

## 2024-04-19 DIAGNOSIS — R7303 Prediabetes: Secondary | ICD-10-CM

## 2024-04-19 DIAGNOSIS — I10 Essential (primary) hypertension: Secondary | ICD-10-CM

## 2024-04-19 DIAGNOSIS — Z0000 Encounter for general adult medical examination without abnormal findings: Secondary | ICD-10-CM

## 2024-04-19 DIAGNOSIS — I480 Paroxysmal atrial fibrillation: Secondary | ICD-10-CM | POA: Diagnosis not present

## 2024-04-19 DIAGNOSIS — G4733 Obstructive sleep apnea (adult) (pediatric): Secondary | ICD-10-CM

## 2024-04-19 DIAGNOSIS — D649 Anemia, unspecified: Secondary | ICD-10-CM

## 2024-04-19 DIAGNOSIS — Z125 Encounter for screening for malignant neoplasm of prostate: Secondary | ICD-10-CM

## 2024-04-19 DIAGNOSIS — C3490 Malignant neoplasm of unspecified part of unspecified bronchus or lung: Secondary | ICD-10-CM | POA: Diagnosis not present

## 2024-04-20 ENCOUNTER — Ambulatory Visit: Payer: Self-pay | Admitting: Family Medicine

## 2024-04-20 LAB — PSA TOTAL (REFLEX TO FREE): Prostate Specific Ag, Serum: 3.2 ng/mL (ref 0.0–4.0)

## 2024-04-20 LAB — CBC
Hematocrit: 46.9 % (ref 37.5–51.0)
Hemoglobin: 15.7 g/dL (ref 13.0–17.7)
MCH: 31.7 pg (ref 26.6–33.0)
MCHC: 33.5 g/dL (ref 31.5–35.7)
MCV: 95 fL (ref 79–97)
Platelets: 271 x10E3/uL (ref 150–450)
RBC: 4.95 x10E6/uL (ref 4.14–5.80)
RDW: 12.9 % (ref 11.6–15.4)
WBC: 9.1 x10E3/uL (ref 3.4–10.8)

## 2024-04-20 LAB — COMPREHENSIVE METABOLIC PANEL WITH GFR
ALT: 14 IU/L (ref 0–44)
AST: 20 IU/L (ref 0–40)
Albumin: 4.4 g/dL (ref 3.8–4.8)
Alkaline Phosphatase: 61 IU/L (ref 47–123)
BUN/Creatinine Ratio: 11 (ref 10–24)
BUN: 14 mg/dL (ref 8–27)
Bilirubin Total: 0.4 mg/dL (ref 0.0–1.2)
CO2: 23 mmol/L (ref 20–29)
Calcium: 9.6 mg/dL (ref 8.6–10.2)
Chloride: 99 mmol/L (ref 96–106)
Creatinine, Ser: 1.25 mg/dL (ref 0.76–1.27)
Globulin, Total: 2.5 g/dL (ref 1.5–4.5)
Glucose: 89 mg/dL (ref 70–99)
Potassium: 4.7 mmol/L (ref 3.5–5.2)
Sodium: 137 mmol/L (ref 134–144)
Total Protein: 6.9 g/dL (ref 6.0–8.5)
eGFR: 59 mL/min/1.73 — ABNORMAL LOW (ref >59)

## 2024-04-20 LAB — LIPID PANEL
Chol/HDL Ratio: 2.8 ratio (ref 0.0–5.0)
Cholesterol, Total: 128 mg/dL (ref 100–199)
HDL: 45 mg/dL (ref >39)
LDL Chol Calc (NIH): 60 mg/dL (ref 0–99)
Triglycerides: 128 mg/dL (ref 0–149)
VLDL Cholesterol Cal: 23 mg/dL (ref 5–40)

## 2024-04-20 LAB — HEMOGLOBIN A1C
Est. average glucose Bld gHb Est-mCnc: 117 mg/dL
Hgb A1c MFr Bld: 5.7 % — ABNORMAL HIGH (ref 4.8–5.6)

## 2024-05-09 NOTE — Patient Instructions (Signed)
 [name]  Please review the attached list of medications and notify my office if there are any errors.   . Please bring all of your medications to every appointment so we can make sure that our medication list is the same as yours.

## 2024-05-10 NOTE — Progress Notes (Signed)
 Chief Complaint  Patient presents with   Annual Exam   Medicare Wellness   Flu Vaccine    Declined today.     Subjective:   Jeffrey Torres is a 78 y.o. male who presents for a Medicare Annual Wellness Visit.  Allergies (verified) Penicillins, Atorvastatin, and Trazodone  and nefazodone   History: Past Medical History:  Diagnosis Date   Acute metabolic encephalopathy 07/31/2020   Anxiety    Arthritis    CAD (coronary artery disease)    a. 02/2022 Chest CT: LAD/RCA Ca2+; b. 03/2022 Lexi MV: no ischemia/infarct; c. 02/2022 Echo: EF 60-65%, AoV sclerosis.   Cancer [facility])    larynx ca, surgery and radiation   COPD (chronic obstructive pulmonary disease) (HCC)    GERD (gastroesophageal reflux disease)    Heart murmur    a. 02/2022 Echo: AoV sclerosis.   Hernia, inguinal, bilateral    History of chicken pox    History of kidney stones    History of measles    History of mumps    History of shingles    History of tobacco abuse    HOH (hard of hearing)    Hypertension    Macular degeneration of both eyes    Motion sickness    ocean boat   OSA (obstructive sleep apnea)    a. Does not tolerate CPAP.   Pneumonia due to COVID-19 virus 07/31/2020   PVC (premature ventricular contraction)    Past Surgical History:  Procedure Laterality Date   CARDIAC CATHETERIZATION  2010   normal per patient report   CATARACT EXTRACTION W/PHACO Right 07/18/2019   Procedure: CATARACT EXTRACTION PHACO AND INTRAOCULAR LENS PLACEMENT (IOC) RIGHT 4.54 00:29.7;  Surgeon: Jaye Fallow, MD;  Location: [facility];  Service: Ophthalmology;  Laterality: Right;   CATARACT EXTRACTION W/PHACO Left 08/08/2019   Procedure: CATARACT EXTRACTION PHACO AND INTRAOCULAR LENS PLACEMENT (IOC) LEFT;  Surgeon: Jaye Fallow, MD;  Location: [facility];  Service: Ophthalmology;  Laterality: Left;  Lot #[id number] H US : 00:34.5 CDE: 4.75   COLONOSCOPY  06/24/2006   Dr. Dessa. Multiple benign appearing 5mm  polyps in the cecum and in the rectum. -Three 8mm polyps in the transversed colon, in the tranverse colon, proximal and in the distal transverse colon. Resected and retrieved.   COLONOSCOPY WITH PROPOFOL  N/A 03/12/2020   Procedure: COLONOSCOPY WITH PROPOFOL ;  Surgeon: Jinny Carmine, MD;  Location: [facility];  Service: Endoscopy;  Laterality: N/A;   CYSTOSCOPY WITH HOLMIUM LASER LITHOTRIPSY  2001   kidney stones removed, [facility]   HERNIA REPAIR     umbilical and bilateral inguninal   INTERCOSTAL NERVE BLOCK  05/28/2022   Procedure: INTERCOSTAL NERVE BLOCK;  Surgeon: Kerrin Elspeth BROCKS, MD;  Location: [facility] OR;  Service: Thoracic;;   LARYNGOSCOPY Right 04/01/2017   Procedure: SUSPENSION LARYNGOSCOPY WITH MICROFLAP EXCISION;  Surgeon: Milissa Hamming, MD;  Location: [facility];  Service: ENT;  Laterality: Right;   LYMPH NODE DISSECTION  05/28/2022   Procedure: LYMPH NODE DISSECTION;  Surgeon: Kerrin Elspeth BROCKS, MD;  Location[facility];  Service: Thoracic;;   sinus surgery   2002   [place]; removal of polyps   TONSILLECTOMY  1954   Tubular adenoma removed  06/24/2006   Family History  Problem Relation Age of Onset   Lung cancer Mother    Heart disease Father    Social History   Occupational History   Occupation: Truck driver    Comment: unemployed  Tobacco Use   Smoking status: Every  Day    Current packs/day: 1.00    Average packs/day: 1 pack/day for 52.9 years (52.9 ttl pk-yrs)    Types: Cigarettes    Start date: 1973   Smokeless tobacco: Never   Tobacco comments:    Smokes 3-4 cigarettes some days- 02/01/2023  Vaping Use   Vaping status: Never Used  Substance and Sexual Activity   Alcohol use: Not Currently    Alcohol/week: 0.0 standard drinks of alcohol   Drug use: No   Sexual activity: Not on file   Tobacco Counseling Ready to quit: No Counseling given: Yes Tobacco comments: Smokes 3-4 cigarettes some days- 02/01/2023  SDOH Screenings   Food Insecurity: Food  Insecurity Present (04/19/2024)  Housing: Low Risk  (04/19/2024)  Transportation Needs: No Transportation Needs (04/19/2024)  Utilities: Not At Risk (04/19/2024)  Alcohol Screen: Low Risk  (04/19/2024)  Depression (PHQ2-9): High Risk (04/19/2024)  Financial Resource Strain: High Risk (04/19/2024)  Physical Activity: Sufficiently Active (04/19/2024)  Social Connections: Moderately Isolated (04/19/2024)  Stress: No Stress Concern Present (04/19/2024)  Tobacco Use: High Risk (04/19/2024)  Health Literacy: Adequate Health Literacy (04/19/2024)   Depression Screen    04/19/2024    9:31 AM 11/24/2022    9:06 AM 07/03/2022   10:58 AM 12/31/2021    8:21 AM 11/18/2021    9:13 AM 11/18/2021    9:12 AM 11/05/2020    9:45 AM  PHQ 2/9 Scores  PHQ - 2 Score 2 0 0 3 5 5 2   PHQ- 9 Score 12  4  1  10  17  17  8       Data saved with a previous flowsheet row definition      Goals Addressed   None    Fall Screening Falls in the past year?: 0 Number of falls in past year: 0 Was there an injury with Fall?: 0 Fall Risk Category Calculator: 0 Patient Fall Risk Level: Low Fall Risk  Fall Risk Patient at Risk for Falls Due to: No Fall Risks  Cognitive Assessment What year is it?: 0 points What month is it?: 0 points Give patient an address phrase to remember (5 components): [address], [place] About what time is it?: 0 points Count backwards from 20 to 1: 0 points Say the months of the year in reverse: 0 points Repeat the address phrase from earlier: 2 points 6 CIT Score: 2 points        Objective:    Today's Vitals   04/19/24 0929 04/19/24 0932  BP: (!) 142/76 138/73  Pulse: (!) 56 (!) 56  Resp: 16   SpO2: 100%   Weight: 203 lb 8 oz (92.3 kg)   Height: 5' 11 (1.803 m)    Body mass index is 28.38 kg/m.  Current Medications (verified) Outpatient Encounter Medications as of 04/19/2024  Medication Sig   albuterol  (VENTOLIN  HFA) 108 (90 Base) MCG/ACT inhaler Inhale 2 puffs into  the lungs every 6 (six) hours as needed.   aspirin  81 MG tablet Take 81 mg by mouth daily.   diltiazem  (CARDIZEM  CD) 120 MG 24 hr capsule TAKE 1 CAPSULE BY MOUTH EVERY DAY   Fluticasone -Umeclidin-Vilant (TRELEGY ELLIPTA ) 100-62.5-25 MCG/ACT AEPB Inhale 1 puff into the lungs daily. (Patient taking differently: Inhale 1 puff into the lungs daily as needed.)   losartan  (COZAAR ) 100 MG tablet Take 1 tablet (100 mg total) by mouth daily.   sertraline  (ZOLOFT ) 100 MG tablet TAKE 1 TABLET BY MOUTH EVERY DAY   [DISCONTINUED] fluticasone  (  FLONASE ) 50 MCG/ACT nasal spray Place 2 sprays into both nostrils every morning.   [DISCONTINUED] Fluticasone -Umeclidin-Vilant (TRELEGY ELLIPTA ) 100-62.5-25 MCG/ACT AEPB Inhale 1 puff into the lungs daily. (Patient not taking: Reported on 02/17/2024)   [DISCONTINUED] KLOR-CON  M20 20 MEQ tablet TAKE 1 TABLET BY MOUTH EVERY DAY (Patient not taking: Reported on 02/17/2024)   [DISCONTINUED] Multiple Vitamins-Minerals (PRESERVISION AREDS 2 PO) Take 2 capsules by mouth every morning.  (Patient not taking: Reported on 02/17/2024)   No facility-administered encounter medications on file as of 04/19/2024.   Hearing/Vision screen No results found. Immunizations and Health Maintenance Health Maintenance  Topic Date Due   COVID-19 Vaccine (1) Never done   Zoster Vaccines- Shingrix  (2 of 2) 01/22/2023   Colonoscopy  03/13/2023   Lung Cancer Screening  09/19/2023   Medicare Annual Wellness (AWV)  11/24/2023   Influenza Vaccine  09/26/2024 (Originally 01/28/2024)   DTaP/Tdap/Td (2 - Td or Tdap) 11/26/2032   Pneumococcal Vaccine: 50+ Years  Completed   Hepatitis C Screening  Completed   Meningococcal B Vaccine  Aged Out        Assessment/Plan:  This is a routine wellness examination for Drue.  Patient Care Team: [name], MD as PCP - General (Family Medicine) Darliss Rogue, MD as PCP - Cardiology (Cardiology) Jaye Fallow, MD as Referring Physician  (Ophthalmology) Milissa Hamming, MD as Referring Physician (Otolaryngology) Lenn Aran, MD as Referring Physician (Radiation Oncology) Laurita Cassis, MD as Referring Physician (Ophthalmology) Verdene Gills, RN as Oncology Nurse Navigator Tamea Dedra CROME, MD as Consulting Physician (Pulmonary Disease) Lorene Lesley CROME DEVONNA as Physician Assistant (Cardiology)  I have personally reviewed and noted the following in the patient's chart:   Medical and social history Use of alcohol, tobacco or illicit drugs  Current medications and supplements including opioid prescriptions. Functional ability and status Nutritional status Physical activity Advanced directives List of other physicians Hospitalizations, surgeries, and ER visits in previous 12 months Vitals Screenings to include cognitive, depression, and falls Referrals and appointments  Orders Placed This Encounter  Procedures   CBC   Comprehensive metabolic panel with GFR    Has the patient fasted?:   No   PSA Total (Reflex To Free)   Hemoglobin A1c   Lipid panel    Has the patient fasted?:   No   Ambulatory referral to Gastroenterology    Referral Priority:   Routine    Referral Type:   Consultation    Referral Reason:   Specialty Services Required    Referred to Provider:   Jinny Carmine, MD    Number of Visits Requested:   1   In addition, I have reviewed and discussed with patient certain preventive protocols, quality metrics, and best practice recommendations. A written personalized care plan for preventive services as well as general preventive health recommendations were provided to patient.   Nancyann Gasper, MD   05/10/2024   No follow-ups on file.  After Visit Summary: (In Person-Printed) AVS printed and given to the patient  Nurse Notes: none

## 2024-05-10 NOTE — Progress Notes (Signed)
 Complete physical exam   Patient: Jeffrey Torres   DOB: 19-Feb-1946   78 y.o. Male  MRN: [id number] Visit Date: 04/19/2024  Today's healthcare provider: Nancyann Perry, MD   Chief Complaint  Patient presents with   Annual Exam   Medicare Wellness   Flu Vaccine    Declined today.   Subjective    Discussed the use of AI scribe software for clinical note transcription with the patient, who gave verbal consent to proceed.  History of Present Illness   Jeffrey Torres is a 78 year old male who presents for an annual physical exam.  He has a history of a benign lung tumor, for which he underwent surgery to remove about a third of his lung. Post-surgery, he experienced dyspnea for six to eight weeks but has since adjusted. He experiences breathing difficulties in extreme heat, which he manages by resting and hydrating. He uses an inhaler as needed for wheezing, which provides relief for 24 hours. He also uses over-the-counter medications like Sudafed or Benadryl for allergy-related breathing issues. Trelegy is used only during symptom flare-ups.  He is currently taking sertraline  for mood management. He experiences depressive symptoms, particularly due to recent personal stressors involving his grandson, who was living with him and is now incarcerated. He is uncertain if the medication is effective.  He has a long-standing history of tinnitus, persisting for about 25 years.  He was previously sharing bills with his grandson, who is now incarcerated.      HPI     Flu Vaccine    Additional comments: Declined today.      Last edited by Rosas, Joseline E, CMA on 04/19/2024  9:29 AM.       Past Medical History:  Diagnosis Date   Acute metabolic encephalopathy 07/31/2020   Anxiety    Arthritis    CAD (coronary artery disease)    a. 02/2022 Chest CT: LAD/RCA Ca2+; b. 03/2022 Lexi MV: no ischemia/infarct; c. 02/2022 Echo: EF 60-65%, AoV sclerosis.   Cancer [facility])    larynx ca,  surgery and radiation   COPD (chronic obstructive pulmonary disease) (HCC)    GERD (gastroesophageal reflux disease)    Heart murmur    a. 02/2022 Echo: AoV sclerosis.   Hernia, inguinal, bilateral    History of chicken pox    History of kidney stones    History of measles    History of mumps    History of shingles    History of tobacco abuse    HOH (hard of hearing)    Hypertension    Macular degeneration of both eyes    Motion sickness    ocean boat   OSA (obstructive sleep apnea)    a. Does not tolerate CPAP.   Pneumonia due to COVID-19 virus 07/31/2020   PVC (premature ventricular contraction)    Past Surgical History:  Procedure Laterality Date   CARDIAC CATHETERIZATION  2010   normal per patient report   CATARACT EXTRACTION W/PHACO Right 07/18/2019   Procedure: CATARACT EXTRACTION PHACO AND INTRAOCULAR LENS PLACEMENT (IOC) RIGHT 4.54 00:29.7;  Surgeon: Jaye Fallow, MD;  Location: [facility];  Service: Ophthalmology;  Laterality: Right;   CATARACT EXTRACTION W/PHACO Left 08/08/2019   Procedure: CATARACT EXTRACTION PHACO AND INTRAOCULAR LENS PLACEMENT (IOC) LEFT;  Surgeon: Jaye Fallow, MD;  Location: [facility] ORS;  Service: Ophthalmology;  Laterality: Left;  Lot #[id number] H US : 00:34.5 CDE: 4.75   COLONOSCOPY  06/24/2006   Dr. Dessa. Multiple benign  appearing 5mm polyps in the cecum and in the rectum. -Three 8mm polyps in the transversed colon, in the tranverse colon, proximal and in the distal transverse colon. Resected and retrieved.   COLONOSCOPY WITH PROPOFOL  N/A 03/12/2020   Procedure: COLONOSCOPY WITH PROPOFOL ;  Surgeon: Jinny Carmine, MD;  Location: [facility];  Service: Endoscopy;  Laterality: N/A;   CYSTOSCOPY WITH HOLMIUM LASER LITHOTRIPSY  2001   kidney stones removed, [facility]   HERNIA REPAIR     umbilical and bilateral inguninal   INTERCOSTAL NERVE BLOCK  05/28/2022   Procedure: INTERCOSTAL NERVE BLOCK;  Surgeon: Kerrin Elspeth BROCKS, MD;   Location: [facility] OR;  Service: Thoracic;;   LARYNGOSCOPY Right 04/01/2017   Procedure: SUSPENSION LARYNGOSCOPY WITH MICROFLAP EXCISION;  Surgeon: Milissa Hamming, MD;  Location: [facility] ORS;  Service: ENT;  Laterality: Right;   LYMPH NODE DISSECTION  05/28/2022   Procedure: LYMPH NODE DISSECTION;  Surgeon: Kerrin Elspeth BROCKS, MD;  Location: [facility] OR;  Service: Thoracic;;   sinus surgery   2002   [place]; removal of polyps   TONSILLECTOMY  1954   Tubular adenoma removed  06/24/2006   Social History   Socioeconomic History   Marital status: Divorced    Spouse name: Not on file   Number of children: 2   Years of education: Not on file   Highest education level: 12th grade  Occupational History   Occupation: Truck driver    Comment: unemployed  Tobacco Use   Smoking status: Every Day    Current packs/day: 1.00    Average packs/day: 1 pack/day for 52.9 years (52.9 ttl pk-yrs)    Types: Cigarettes    Start date: 1973   Smokeless tobacco: Never   Tobacco comments:    Smokes 3-4 cigarettes some days- 02/01/2023  Vaping Use   Vaping status: Never Used  Substance and Sexual Activity   Alcohol use: Not Currently    Alcohol/week: 0.0 standard drinks of alcohol   Drug use: No   Sexual activity: Not on file  Other Topics Concern   Not on file  Social History Narrative   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   Social Drivers of Health   Financial Resource Strain: High Risk (04/19/2024)   Overall Financial Resource Strain (CARDIA)    Difficulty of Paying Living Expenses: Hard  Food Insecurity: Food Insecurity Present (04/19/2024)   Hunger Vital Sign    Worried About Running Out of Food in the Last Year: Sometimes true    Ran Out of Food in the Last Year: Sometimes true  Transportation Needs: No Transportation Needs (04/19/2024)   PRAPARE - Administrator, Civil Service (Medical): No    Lack of Transportation (Non-Medical): No  Physical Activity: Sufficiently Active  (04/19/2024)   Exercise Vital Sign    Days of Exercise per Week: 7 days    Minutes of Exercise per Session: 40 min  Stress: No Stress Concern Present (04/19/2024)   [employer] of Occupational Health - Occupational Stress Questionnaire    Feeling of Stress: Only a little  Social Connections: Moderately Isolated (04/19/2024)   Social Connection and Isolation Panel    Frequency of Communication with Friends and Family: More than three times a week    Frequency of Social Gatherings with Friends and Family: More than three times a week    Attends Religious Services: 1 to 4 times per year    Active Member of [employer] or Organizations: No    Attends Banker Meetings: Never  Marital Status: Divorced  Catering Manager Violence: Not At Risk (04/19/2024)   Humiliation, Afraid, Rape, and Kick questionnaire    Fear of Current or Ex-Partner: No    Emotionally Abused: No    Physically Abused: No    Sexually Abused: No   Family Status  Relation Name Status   Mother  Deceased at age 78       cause of death: Lung Cancer   Father  Deceased   Sister  Alive   Brother  Alive  No partnership data on file   Family History  Problem Relation Age of Onset   Lung cancer Mother    Heart disease Father    Allergies  Allergen Reactions   Penicillins Anaphylaxis and Swelling    Did it involve swelling of the face/tongue/throat, SOB, or low BP? Yes Did it involve sudden or severe rash/hives, skin peeling, or any reaction on the inside of your mouth or nose? No Did you need to seek medical attention at a hospital or doctor's office? No When did it last happen?      10 + years ago If all above answers are "NO", may proceed with cephalosporin use.    Atorvastatin Other (See Comments)    myalgia   Trazodone  And Nefazodone     Caused fainting spells    Patient Care Team: [name], MD as PCP - General (Family Medicine) Darliss Rogue, MD as PCP - Cardiology  (Cardiology) Jaye Fallow, MD as Referring Physician (Ophthalmology) Milissa Hamming, MD as Referring Physician (Otolaryngology) Lenn Aran, MD as Referring Physician (Radiation Oncology) Laurita Cassis, MD as Referring Physician (Ophthalmology) Verdene Gills, RN as Oncology Nurse Navigator Tamea Dedra CROME, MD as Consulting Physician (Pulmonary Disease) Lorene Lesley CROME DEVONNA as Physician Assistant (Cardiology)   Medications: Outpatient Medications Prior to Visit  Medication Sig   albuterol  (VENTOLIN  HFA) 108 (90 Base) MCG/ACT inhaler Inhale 2 puffs into the lungs every 6 (six) hours as needed.   aspirin  81 MG tablet Take 81 mg by mouth daily.   diltiazem  (CARDIZEM  CD) 120 MG 24 hr capsule TAKE 1 CAPSULE BY MOUTH EVERY DAY   Fluticasone -Umeclidin-Vilant (TRELEGY ELLIPTA ) 100-62.5-25 MCG/ACT AEPB Inhale 1 puff into the lungs daily. (Patient taking differently: Inhale 1 puff into the lungs daily as needed.)   losartan  (COZAAR ) 100 MG tablet Take 1 tablet (100 mg total) by mouth daily.   sertraline  (ZOLOFT ) 100 MG tablet TAKE 1 TABLET BY MOUTH EVERY DAY   [DISCONTINUED] fluticasone  (FLONASE ) 50 MCG/ACT nasal spray Place 2 sprays into both nostrils every morning.   [DISCONTINUED] KLOR-CON  M20 20 MEQ tablet TAKE 1 TABLET BY MOUTH EVERY DAY (Patient not taking: Reported on 02/17/2024)   [DISCONTINUED] Multiple Vitamins-Minerals (PRESERVISION AREDS 2 PO) Take 2 capsules by mouth every morning.  (Patient not taking: Reported on 02/17/2024)   No facility-administered medications prior to visit.        Objective    BP 138/73 (BP Location: Left Arm, Patient Position: Sitting, Cuff Size: Normal)   Pulse (!) 56   Resp 16   Ht 5' 11 (1.803 m)   Wt 203 lb 8 oz (92.3 kg)   SpO2 100%   BMI 28.38 kg/m    Physical Exam  General Appearance:    Well developed, well nourished male. Alert, cooperative, in no acute distress, appears stated age  Head:    Normocephalic, without obvious  abnormality, atraumatic  Eyes:    PERRL, conjunctiva/corneas clear, EOM's intact, fundi    benign,  both eyes       Ears:    Normal TM's and external ear canals, both ears  Nose:   Nares normal, septum midline, mucosa normal, no drainage   or sinus tenderness  Throat:   Lips, mucosa, and tongue normal; teeth and gums normal  Neck:   Supple, symmetrical, trachea midline, no adenopathy;       thyroid :  No enlargement/tenderness/nodules; no carotid   bruit or JVD  Back:     Symmetric, no curvature, ROM normal, no CVA tenderness  Lungs:     Clear to auscultation bilaterally, respirations unlabored  Chest wall:    No tenderness or deformity  Heart:    Bradycardic. Regular rhythm. No murmurs, rubs, or gallops.  S1 and S2 normal  Abdomen:     Soft, non-tender, bowel sounds active all four quadrants,    no masses, no organomegaly  Genitalia:    deferred  Rectal:    deferred  Extremities:   All extremities are intact. No cyanosis or edema  Pulses:   2+ and symmetric all extremities  Skin:   Skin color, texture, turgor normal, no rashes or lesions  Lymph nodes:   Cervical, supraclavicular, and axillary nodes normal  Neurologic:   CNII-XII intact. Normal strength, sensation and reflexes      throughout     Assessment & Plan    Routine Health Maintenance and Physical Exam  Exercise Activities and Dietary recommendations  Goals      Adjust to New diagnosis of lung cancer     Care Coordination Interventions: Assessed patient understanding of cancer diagnosis and recommended treatment plan Reviewed upcoming provider appointments and treatment appointments Assessed available transportation to appointments and treatments. Has consistent/reliable transportation: Yes Assessed support system. Has consistent/reliable family or other support: Yes Appointment for CT went well, no further issues with cancer, will have 6 month follow up.  Was seen by pulmonary yesterday for cough/congestion, given  antibiotics and will follow up on 3/28.  PCP office will call to schedule 6 month follow up, last seen on 10/25       DIET - REDUCE CALORIE INTAKE     Recommend continuing current diet plan of avoiding fried foods and junk food to help aid in weight loss.       Effective management of HTN     Care Coordination Interventions: Evaluation of current treatment plan related to hypertension self management and patient's adherence to plan as established by provider Provided assistance with obtaining home blood pressure monitor via confirmed machine has been received; Reviewed scheduled/upcoming provider appointments including:  Advised to monitor blood pressure, record readings and share with provider during next visit. Cardiology on 5/10 and PCP on 5/28.  Advised to call to schedule AWV. Discussed medications, state he has restarted antihypertensives  5/30 - goal met, HTN currently controlled        Immunization History  Administered Date(s) Administered   Fluad Quad(high Dose 65+) 08/07/2020, 03/18/2021, 03/17/2022   INFLUENZA, HIGH DOSE SEASONAL PF 03/25/2017, 03/29/2018, 04/24/2019   Pneumococcal Conjugate-13 04/26/2014   Pneumococcal Polysaccharide-23 05/28/2012   Tdap 11/27/2022   Zoster Recombinant(Shingrix ) 11/27/2022    Health Maintenance  Topic Date Due   COVID-19 Vaccine (1) Never done   Zoster Vaccines- Shingrix  (2 of 2) 01/22/2023   Colonoscopy  03/13/2023   Lung Cancer Screening  09/19/2023   Medicare Annual Wellness (AWV)  11/24/2023   Influenza Vaccine  09/26/2024 (Originally 01/28/2024)   DTaP/Tdap/Td (2 - Td or Tdap) 11/26/2032  Pneumococcal Vaccine: 50+ Years  Completed   Hepatitis C Screening  Completed   Meningococcal B Vaccine  Aged Out    Discussed health benefits of physical activity, and encouraged him to engage in regular exercise appropriate for his age and condition.  2. Prostate cancer screening  - PSA Total (Reflex To Free)  3. PAF  (paroxysmal atrial fibrillation) (HCC) Now in sinus rhythm, normal rate  4. Primary hypertension BP near goal  - CBC - Comprehensive metabolic panel with GFR - Lipid panel  5. Severe obstructive sleep apnea   6. Anemia, unspecified type Checking CBC  7. History of adenomatous polyp of colon Overdue for follow up colonoscopy  - Ambulatory referral to Gastroenterology  8. Depression, unspecified depression type On sertraline  for depression. Reports intermittent symptoms, possibly due to stressors. Uncertain about medication efficacy. - Continue sertraline  100 mg daily. - Consider dose adjustment if symptoms persist.  9. Prediabetes  - Hemoglobin A1c  10. Malignant neoplasm of lung, unspecified laterality, unspecified part of lung (HCC)  Post-surgical dyspnea improved. Dyspnea in extreme heat managed with inhaler. Uses Trelegy and OTC medications for symptom relief. - Continue as-needed inhaler use for wheezing. - Follow up with pulmonologist Dr. Jacobo. - Ambulatory referral to Hematology / Oncology            Nancyann Perry, MD  [facility] [phone] (phone) [phone] (fax)  [facility]

## 2024-05-11 ENCOUNTER — Encounter: Payer: Self-pay | Admitting: Oncology

## 2024-05-11 ENCOUNTER — Other Ambulatory Visit: Payer: Self-pay | Admitting: *Deleted

## 2024-05-11 DIAGNOSIS — C3490 Malignant neoplasm of unspecified part of unspecified bronchus or lung: Secondary | ICD-10-CM

## 2024-05-12 NOTE — Progress Notes (Signed)
 Jeffrey Torres                                          MRN: [id number]   05/12/2024   The VBCI Quality Team Specialist reviewed this patient medical record for the purposes of chart review for care gap closure. The following were reviewed: abstraction for care gap closure-controlling blood pressure.    VBCI Quality Team

## 2024-05-14 ENCOUNTER — Other Ambulatory Visit: Payer: Self-pay | Admitting: Physician Assistant

## 2024-05-18 ENCOUNTER — Ambulatory Visit
Admission: RE | Admit: 2024-05-18 | Discharge: 2024-05-18 | Disposition: A | Discharge status: Home / Self Care | Source: Ambulatory Visit | Attending: Oncology | Admitting: Oncology

## 2024-05-18 DIAGNOSIS — I700 Atherosclerosis of aorta: Secondary | ICD-10-CM | POA: Diagnosis not present

## 2024-05-18 DIAGNOSIS — C3490 Malignant neoplasm of unspecified part of unspecified bronchus or lung: Secondary | ICD-10-CM | POA: Diagnosis not present

## 2024-05-18 DIAGNOSIS — R911 Solitary pulmonary nodule: Secondary | ICD-10-CM | POA: Diagnosis not present

## 2024-05-18 MED ORDER — IOHEXOL 300 MG/ML  SOLN
75.0000 mL | Freq: Once | INTRAMUSCULAR | Status: AC | PRN
  Administered 2024-05-18: 75 mL via INTRAVENOUS

## 2024-05-23 ENCOUNTER — Telehealth: Payer: Self-pay | Admitting: Cardiology

## 2024-05-23 ENCOUNTER — Encounter: Payer: Self-pay | Admitting: Cardiology

## 2024-05-23 ENCOUNTER — Ambulatory Visit: Attending: Cardiology | Admitting: Cardiology

## 2024-05-23 VITALS — BP 140/62 | HR 57 | Ht 71.0 in | Wt 206.6 lb

## 2024-05-23 DIAGNOSIS — I2510 Atherosclerotic heart disease of native coronary artery without angina pectoris: Secondary | ICD-10-CM | POA: Diagnosis not present

## 2024-05-23 DIAGNOSIS — I10 Essential (primary) hypertension: Secondary | ICD-10-CM | POA: Diagnosis not present

## 2024-05-23 MED ORDER — EZETIMIBE 10 MG PO TABS: 10.0000 mg | ORAL_TABLET | Freq: Every day | ORAL | 3 refills | Status: AC

## 2024-05-23 MED ORDER — AMLODIPINE BESYLATE 5 MG PO TABS: 5.0000 mg | ORAL_TABLET | Freq: Every day | ORAL | 3 refills | Status: AC

## 2024-05-23 NOTE — Patient Instructions (Signed)
 Medication Instructions:  - START zetia  10 mg daily  - START norvasc  5 mg daily - STOP cardizem    *If you need a refill on your cardiac medications before your next appointment, please call your pharmacy*  Lab Work: No labs ordered today  If you have labs (blood work) drawn today and your tests are completely normal, you will receive your results only by: MyChart Message (if you have MyChart) OR A paper copy in the mail If you have any lab test that is abnormal or we need to change your treatment, we will call you to review the results.  Testing/Procedures: No test ordered today   Follow-Up: At [facility], you and your health needs are our priority.  As part of our continuing mission to provide you with exceptional heart care, our providers are all part of one team.  This team includes your primary Cardiologist (physician) and Advanced Practice Providers or APPs (Physician Assistants and Nurse Practitioners) who all work together to provide you with the care you need, when you need it.  Your next appointment:   8 week(s)  Provider:   You may see Redell Cave, MD or one of the following Advanced Practice Providers on your designated Care Team:   Lonni Meager, NP Lesley Maffucci, PA-C Bernardino Bring, PA-C Cadence [place], PA-C Tylene Lunch, NP Barnie Hila, NP    We recommend signing up for the patient portal called MyChart.  Sign up information is provided on this After Visit Summary.  MyChart is used to connect with patients for Virtual Visits (Telemedicine).  Patients are able to view lab/test results, encounter notes, upcoming appointments, etc.  Non-urgent messages can be sent to your provider as well.   To learn more about what you can do with MyChart, go to [email or web address].

## 2024-05-23 NOTE — Telephone Encounter (Signed)
 Pt requesting a c/b to discuss medication changes made at his appt today.

## 2024-05-23 NOTE — Telephone Encounter (Signed)
 Spoke with patient and clarified medications supposed to be started today vs medication supposed to be stopped. He was confused on about those. Went over changes made today during visit. No other questions at this time

## 2024-05-23 NOTE — Progress Notes (Signed)
 Cardiology Office Note:    Date:  05/23/2024   ID:  [name], DOB August 22, 1945, MRN [id number]  PCP:  Jeffrey Nancyann BRAVO, MD  [facility] Cardiologist:  Redell Cave, MD  [facility] Electrophysiologist:  None   Referring MD: Jeffrey Nancyann BRAVO, MD   Chief Complaint  Patient presents with   Follow-up    3 month follow up pt has been doing well with no complaints of chest pain, chest pressure or SOB, medciation reviewed verbally with patient    History of Present Illness:    Jeffrey Torres is a 78 y.o. male with a hx of CAD (LAD and RCA calcifications on chest CT 10/23), hypertension, OSA, former smoker x35+ years, COPD, non-small cell right lung cancer s/p surgical resection who presents for follow-up.    Denies chest pain, did not tolerate Pravachol  due to not feeling well.  Lipitor has caused muscle aches in the past.  BP at home has been elevated with systolics in the 160s.  His heart rate is typically in the 50s and sometimes low 60s.  Compliant medications as prescribed.   Prior notes.   Lexiscan  Myoview  10/23, low risk study, no evidence of ischemia Echo 02/2022 EF 60 to 65%, aortic valve sclerosis Patient on Cardizem  due to history of PACs, PVCs, atrial bigeminy and hypertension. diagnosed with OSA, CPAP mask recommended but patient does not like to wear a mask.  He will try to lose weight to see if this will help with his sleep apnea.     Past Medical History:  Diagnosis Date   Acute metabolic encephalopathy 07/31/2020   Anxiety    Arthritis    CAD (coronary artery disease)    a. 02/2022 Chest CT: LAD/RCA Ca2+; b. 03/2022 Lexi MV: no ischemia/infarct; c. 02/2022 Echo: EF 60-65%, AoV sclerosis.   Cancer [facility])    larynx ca, surgery and radiation   COPD (chronic obstructive pulmonary disease) (HCC)    GERD (gastroesophageal reflux disease)    Heart murmur    a. 02/2022 Echo: AoV sclerosis.   Hernia, inguinal, bilateral    History of chicken pox    History  of kidney stones    History of measles    History of mumps    History of shingles    History of tobacco abuse    HOH (hard of hearing)    Hypertension    Macular degeneration of both eyes    Motion sickness    ocean boat   OSA (obstructive sleep apnea)    a. Does not tolerate CPAP.   Pneumonia due to COVID-19 virus 07/31/2020   PVC (premature ventricular contraction)     Past Surgical History:  Procedure Laterality Date   CARDIAC CATHETERIZATION  2010   normal per patient report   CATARACT EXTRACTION W/PHACO Right 07/18/2019   Procedure: CATARACT EXTRACTION PHACO AND INTRAOCULAR LENS PLACEMENT (IOC) RIGHT 4.54 00:29.7;  Surgeon: Jaye Fallow, MD;  Location: [facility];  Service: Ophthalmology;  Laterality: Right;   CATARACT EXTRACTION W/PHACO Left 08/08/2019   Procedure: CATARACT EXTRACTION PHACO AND INTRAOCULAR LENS PLACEMENT (IOC) LEFT;  Surgeon: Jaye Fallow, MD;  Location: [facility] ORS;  Service: Ophthalmology;  Laterality: Left;  Lot #[id number] H US : 00:34.5 CDE: 4.75   COLONOSCOPY  06/24/2006   Dr. Dessa. Multiple benign appearing 5mm polyps in the cecum and in the rectum. -Three 8mm polyps in the transversed colon, in the tranverse colon, proximal and in the distal transverse colon. Resected and retrieved.  COLONOSCOPY WITH PROPOFOL  N/A 03/12/2020   Procedure: COLONOSCOPY WITH PROPOFOL ;  Surgeon: Jinny Carmine, MD;  Location[facility] END[facility];  Service: Endoscopy;  Laterality: N/A;   CYSTOSCOPY WITH HOLMIUM LASER LITHOTRIPSY  2001   kidney stones removed, [facility]   HERNIA REPAIR     umbilical and bilateral inguninal   INTERCOSTAL NERVE BLOCK  05/28/2022   Procedure: INTERCOSTAL NERVE BLOCK;  Surgeon: Kerrin Elspeth BROCKS, MD;  Location: [facility] OR;  Service: Thoracic;;   LARYNGOSCOPY Right 04/01/2017   Procedure: SUSPENSION LARYNGOSCOPY WITH MICROFLAP EXCISION;  Surgeon: Milissa Hamming, MD;  Location: [facility] ORS;  Service: ENT;  Laterality: Right;   LYMPH NODE  DISSECTION  05/28/2022   Procedure: LYMPH NODE DISSECTION;  Surgeon: Kerrin Elspeth BROCKS, MD;  Location: [facility] OR;  Service: Thoracic;;   sinus surgery   2002   [place]; removal of polyps   TONSILLECTOMY  1954   Tubular adenoma removed  06/24/2006    Current Medications: Current Meds  Medication Sig   albuterol  (VENTOLIN  HFA) 108 (90 Base) MCG/ACT inhaler Inhale 2 puffs into the lungs every 6 (six) hours as needed.   amLODipine  (NORVASC ) 5 MG tablet Take 1 tablet (5 mg total) by mouth daily.   aspirin  81 MG tablet Take 81 mg by mouth daily.   ezetimibe  (ZETIA ) 10 MG tablet Take 1 tablet (10 mg total) by mouth daily.   Fluticasone -Umeclidin-Vilant (TRELEGY ELLIPTA ) 100-62.5-25 MCG/ACT AEPB Inhale 1 puff into the lungs daily. (Patient taking differently: Inhale 1 puff into the lungs daily as needed.)   losartan  (COZAAR ) 100 MG tablet TAKE 1 TABLET BY MOUTH EVERY DAY   sertraline  (ZOLOFT ) 100 MG tablet TAKE 1 TABLET BY MOUTH EVERY DAY   [DISCONTINUED] diltiazem  (CARDIZEM  CD) 120 MG 24 hr capsule TAKE 1 CAPSULE BY MOUTH EVERY DAY     Allergies:   Penicillins, Atorvastatin, and Trazodone  and nefazodone   Social History   Socioeconomic History   Marital status: Divorced    Spouse name: Not on file   Number of children: 2   Years of education: Not on file   Highest education level: 12th grade  Occupational History   Occupation: Truck hospital doctor    Comment: unemployed  Tobacco Use   Smoking status: Every Day    Current packs/day: 1.00    Average packs/day: 1 pack/day for 52.9 years (52.9 ttl pk-yrs)    Types: Cigarettes    Start date: 1973   Smokeless tobacco: Never   Tobacco comments:    Smokes 3-4 cigarettes some days- 02/01/2023  Vaping Use   Vaping status: Never Used  Substance and Sexual Activity   Alcohol use: Not Currently    Alcohol/week: 0.0 standard drinks of alcohol   Drug use: No   Sexual activity: Not on file  Other Topics Concern   Not on file  Social History  Narrative   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   Social Drivers of Health   Financial Resource Strain: High Risk (04/19/2024)   Overall Financial Resource Strain (CARDIA)    Difficulty of Paying Living Expenses: Hard  Food Insecurity: Food Insecurity Present (04/19/2024)   Hunger Vital Sign    Worried About Running Out of Food in the Last Year: Sometimes true    Ran Out of Food in the Last Year: Sometimes true  Transportation Needs: No Transportation Needs (04/19/2024)   PRAPARE - Administrator, Civil Service (Medical): No    Lack of Transportation (Non-Medical): No  Physical Activity: Sufficiently Active (04/19/2024)  Exercise Vital Sign    Days of Exercise per Week: 7 days    Minutes of Exercise per Session: 40 min  Stress: No Stress Concern Present (04/19/2024)   [employer] of Occupational Health - Occupational Stress Questionnaire    Feeling of Stress: Only a little  Social Connections: Moderately Isolated (04/19/2024)   Social Connection and Isolation Panel    Frequency of Communication with Friends and Family: More than three times a week    Frequency of Social Gatherings with Friends and Family: More than three times a week    Attends Religious Services: 1 to 4 times per year    Active Member of [employer] or Organizations: No    Attends Banker Meetings: Never    Marital Status: Divorced     Family History: The patient's family history includes Heart disease in his father; Lung cancer in his mother.  ROS:   Please see the history of present illness.     All other systems reviewed and are negative.  EKGs/Labs/Other Studies Reviewed:    The following studies were reviewed today:   EKG:  EKG not ordered today.    Recent Labs: 04/19/2024: ALT 14; BUN 14; Creatinine, Ser 1.25; Hemoglobin 15.7; Platelets 271; Potassium 4.7; Sodium 137  Recent Lipid Panel    Component Value Date/Time   CHOL 128 04/19/2024 1000   TRIG 128  04/19/2024 1000   HDL 45 04/19/2024 1000   CHOLHDL 2.8 04/19/2024 1000   LDLCALC 60 04/19/2024 1000    Physical Exam:    VS:  BP (!) 140/62 (BP Location: Left Arm, Patient Position: Sitting, Cuff Size: Normal)   Pulse (!) 57   Ht 5' 11 (1.803 m)   Wt 206 lb 9.6 oz (93.7 kg)   SpO2 99%   BMI 28.81 kg/m     Wt Readings from Last 3 Encounters:  05/23/24 206 lb 9.6 oz (93.7 kg)  04/19/24 203 lb 8 oz (92.3 kg)  02/17/24 198 lb 6 oz (90 kg)     GEN:  Well nourished, well developed in no acute distress HEENT: Normal NECK: No JVD; No carotid bruits CARDIAC: RRR, no murmurs, rubs, gallops RESPIRATORY:  Clear to auscultation without rales, wheezing or rhonchi  ABDOMEN: Soft, non-tender, non-distended MUSCULOSKELETAL:  No edema; No deformity  SKIN: Warm and dry NEUROLOGIC:  Alert and oriented x 3 PSYCHIATRIC:  Normal affect   ASSESSMENT:    1. Coronary artery disease involving native coronary artery of native heart without angina pectoris   2. Primary hypertension     PLAN:    In order of problems listed above:  CAD, RCA and LAD calcifications on chest CT. denies chest pain, echo 9/23 EF 60 to 65%, Lexiscan  Myoview  10/23 no ischemia, low risk study.  Did not tolerate Pravachol  or Lipitor.  Start Zetia  10 mg daily, continue aspirin  81 mg,. Hypertension, blood pressure elevated today, bradycardic.  Stop Cardizem , start amlodipine  5 mg daily, continue losartan  100 mg daily.  Check BP at home and keep a log.  If BP elevated at follow-up visit, titrate amlodipine .  Follow-up in 8 weeks.   Medication Adjustments/Labs and Tests Ordered: Current medicines are reviewed at length with the patient today.  Concerns regarding medicines are outlined above.  No orders of the defined types were placed in this encounter.   Meds ordered this encounter  Medications   ezetimibe  (ZETIA ) 10 MG tablet    Sig: Take 1 tablet (10 mg total) by mouth daily.  Dispense:  90 tablet    Refill:  3    amLODipine  (NORVASC ) 5 MG tablet    Sig: Take 1 tablet (5 mg total) by mouth daily.    Dispense:  180 tablet    Refill:  3     Patient Instructions  Medication Instructions:  - START zetia  10 mg daily  - START norvasc  5 mg daily - STOP cardizem    *If you need a refill on your cardiac medications before your next appointment, please call your pharmacy*  Lab Work: No labs ordered today  If you have labs (blood work) drawn today and your tests are completely normal, you will receive your results only by: MyChart Message (if you have MyChart) OR A paper copy in the mail If you have any lab test that is abnormal or we need to change your treatment, we will call you to review the results.  Testing/Procedures: No test ordered today   Follow-Up: At [facility], you and your health needs are our priority.  As part of our continuing mission to provide you with exceptional heart care, our providers are all part of one team.  This team includes your primary Cardiologist (physician) and Advanced Practice Providers or APPs (Physician Assistants and Nurse Practitioners) who all work together to provide you with the care you need, when you need it.  Your next appointment:   8 week(s)  Provider:   You may see Redell Cave, MD or one of the following Advanced Practice Providers on your designated Care Team:   Lonni Meager, NP Lesley Maffucci, PA-C Bernardino Bring, PA-C Cadence Kenton, PA-C Tylene Lunch, NP Barnie Hila, NP    We recommend signing up for the patient portal called MyChart.  Sign up information is provided on this After Visit Summary.  MyChart is used to connect with patients for Virtual Visits (Telemedicine).  Patients are able to view lab/test results, encounter notes, upcoming appointments, etc.  Non-urgent messages can be sent to your provider as well.   To learn more about what you can do with MyChart, go to [email or web address].              Signed, Redell Cave, MD  05/23/2024 9:56 AM    [facility]

## 2024-05-29 ENCOUNTER — Encounter: Payer: Self-pay | Admitting: Oncology

## 2024-05-29 ENCOUNTER — Inpatient Hospital Stay: Attending: Oncology | Admitting: Oncology

## 2024-05-29 VITALS — BP 148/80 | HR 58 | Temp 97.8°F | Resp 18 | Ht 71.0 in | Wt 207.0 lb

## 2024-05-29 DIAGNOSIS — C3490 Malignant neoplasm of unspecified part of unspecified bronchus or lung: Secondary | ICD-10-CM

## 2024-05-29 DIAGNOSIS — Z8521 Personal history of malignant neoplasm of larynx: Secondary | ICD-10-CM | POA: Diagnosis present

## 2024-05-29 DIAGNOSIS — Z7982 Long term (current) use of aspirin: Secondary | ICD-10-CM | POA: Diagnosis not present

## 2024-05-29 DIAGNOSIS — Z85118 Personal history of other malignant neoplasm of bronchus and lung: Secondary | ICD-10-CM | POA: Insufficient documentation

## 2024-05-29 DIAGNOSIS — F17210 Nicotine dependence, cigarettes, uncomplicated: Secondary | ICD-10-CM | POA: Diagnosis not present

## 2024-05-29 DIAGNOSIS — I10 Essential (primary) hypertension: Secondary | ICD-10-CM | POA: Diagnosis not present

## 2024-05-29 DIAGNOSIS — Z79899 Other long term (current) drug therapy: Secondary | ICD-10-CM | POA: Diagnosis not present

## 2024-05-29 DIAGNOSIS — Z923 Personal history of irradiation: Secondary | ICD-10-CM | POA: Diagnosis not present

## 2024-05-29 NOTE — Progress Notes (Signed)
 [facility]  [phone] [phone]  ID: Jeffrey Torres OB: 19-Aug-1945  MR#: [id number]  RDW#:[id number]  Patient Care Team: [name], MD as PCP - General (Family Medicine) Darliss Rogue, MD as PCP - Cardiology (Cardiology) Jaye Fallow, MD as Referring Physician (Ophthalmology) Milissa Hamming, MD as Referring Physician (Otolaryngology) Lenn Aran, MD as Referring Physician (Radiation Oncology) Laurita Cassis, MD as Referring Physician (Ophthalmology) Verdene Gills, RN as Oncology Nurse Navigator Tamea Dedra CROME, MD as Consulting Physician (Pulmonary Disease) Lorene Lesley CROME DEVONNA as Physician Assistant (Cardiology)  CHIEF COMPLAINT: Stage Ib adenocarcinoma of the lung.  INTERVAL HISTORY: Patient returns to clinic today for routine yearly evaluation and discussion of his CT scan results.  He missed his 43-month imaging and evaluation.  He currently feels well and is asymptomatic. He has no neurologic complaints.  He denies any recent fevers or illnesses.  He has a good appetite and denies weight loss.  He has no chest pain, shortness of breath, cough, or hemoptysis.  He denies any nausea, vomiting, constipation, or diarrhea.  He has no urinary complaints.  Patient offers no specific complaints today.  REVIEW OF SYSTEMS:   Review of Systems  Constitutional: Negative.  Negative for fever, malaise/fatigue and weight loss.  Respiratory: Negative.  Negative for cough, hemoptysis and shortness of breath.   Cardiovascular: Negative.  Negative for chest pain and leg swelling.  Gastrointestinal: Negative.  Negative for abdominal pain.  Genitourinary: Negative.  Negative for dysuria.  Musculoskeletal: Negative.  Negative for back pain.  Skin: Negative.  Negative for rash.  Neurological: Negative.  Negative for dizziness, focal weakness, weakness and headaches.  Psychiatric/Behavioral: Negative.  The patient is not nervous/anxious.      As per HPI. Otherwise, a complete review of systems is negative.  PAST MEDICAL HISTORY: Past Medical History:  Diagnosis Date   Acute metabolic encephalopathy 07/31/2020   Anxiety    Arthritis    CAD (coronary artery disease)    a. 02/2022 Chest CT: LAD/RCA Ca2+; b. 03/2022 Lexi MV: no ischemia/infarct; c. 02/2022 Echo: EF 60-65%, AoV sclerosis.   Cancer [facility])    larynx ca, surgery and radiation   COPD (chronic obstructive pulmonary disease) (HCC)    GERD (gastroesophageal reflux disease)    Heart murmur    a. 02/2022 Echo: AoV sclerosis.   Hernia, inguinal, bilateral    History of chicken pox    History of kidney stones    History of measles    History of mumps    History of shingles    History of tobacco abuse    HOH (hard of hearing)    Hypertension    Macular degeneration of both eyes    Motion sickness    ocean boat   OSA (obstructive sleep apnea)    a. Does not tolerate CPAP.   Pneumonia due to COVID-19 virus 07/31/2020   PVC (premature ventricular contraction)     PAST SURGICAL HISTORY: Past Surgical History:  Procedure Laterality Date   CARDIAC CATHETERIZATION  2010   normal per patient report   CATARACT EXTRACTION W/PHACO Right 07/18/2019   Procedure: CATARACT EXTRACTION PHACO AND INTRAOCULAR LENS PLACEMENT (IOC) RIGHT 4.54 00:29.7;  Surgeon: Jaye Fallow, MD;  Location: [facility];  Service: Ophthalmology;  Laterality: Right;   CATARACT EXTRACTION W/PHACO Left 08/08/2019   Procedure: CATARACT EXTRACTION PHACO AND INTRAOCULAR LENS PLACEMENT (IOC) LEFT;  Surgeon: Jaye Fallow, MD;  Location: [facility] ORS;  Service: Ophthalmology;  Laterality: Left;  Lot #  [id number] H US : 00:34.5 CDE: 4.75   COLONOSCOPY  06/24/2006   Dr. Dessa. Multiple benign appearing 5mm polyps in the cecum and in the rectum. -Three 8mm polyps in the transversed colon, in the tranverse colon, proximal and in the distal transverse colon. Resected and retrieved.   COLONOSCOPY  WITH PROPOFOL  N/A 03/12/2020   Procedure: COLONOSCOPY WITH PROPOFOL ;  Surgeon: Jinny Carmine, MD;  Location: [facility] ENDOSCOPY;  Service: Endoscopy;  Laterality: N/A;   CYSTOSCOPY WITH HOLMIUM LASER LITHOTRIPSY  2001   kidney stones removed, [facility]   HERNIA REPAIR     umbilical and bilateral inguninal   INTERCOSTAL NERVE BLOCK  05/28/2022   Procedure: INTERCOSTAL NERVE BLOCK;  Surgeon: Kerrin Elspeth BROCKS, MD;  Location: [facility] OR;  Service: Thoracic;;   LARYNGOSCOPY Right 04/01/2017   Procedure: SUSPENSION LARYNGOSCOPY WITH MICROFLAP EXCISION;  Surgeon: Milissa Hamming, MD;  Location: [facility] ORS;  Service: ENT;  Laterality: Right;   LYMPH NODE DISSECTION  05/28/2022   Procedure: LYMPH NODE DISSECTION;  Surgeon: Kerrin Elspeth BROCKS, MD;  Location: [facility] OR;  Service: Thoracic;;   sinus surgery   2002   [place]; removal of polyps   TONSILLECTOMY  1954   Tubular adenoma removed  06/24/2006    FAMILY HISTORY: Family History  Problem Relation Age of Onset   Lung cancer Mother    Heart disease Father     ADVANCED DIRECTIVES (Y/N):  N  HEALTH MAINTENANCE: Social History   Tobacco Use   Smoking status: Every Day    Current packs/day: 1.00    Average packs/day: 1 pack/day for 52.9 years (52.9 ttl pk-yrs)    Types: Cigarettes    Start date: 1973   Smokeless tobacco: Never   Tobacco comments:    Smokes 3-4 cigarettes some days- 02/01/2023  Vaping Use   Vaping status: Never Used  Substance Use Topics   Alcohol use: Not Currently    Alcohol/week: 0.0 standard drinks of alcohol   Drug use: No     Colonoscopy:  PAP:  Bone density:  Lipid panel:  Allergies  Allergen Reactions   Penicillins Anaphylaxis and Swelling    Did it involve swelling of the face/tongue/throat, SOB, or low BP? Yes Did it involve sudden or severe rash/hives, skin peeling, or any reaction on the inside of your mouth or nose? No Did you need to seek medical attention at a hospital or doctor's office? No When did  it last happen?      10 + years ago If all above answers are "NO", may proceed with cephalosporin use.    Atorvastatin Other (See Comments)    myalgia   Trazodone  And Nefazodone     Caused fainting spells    Current Outpatient Medications  Medication Sig Dispense Refill   albuterol  (VENTOLIN  HFA) 108 (90 Base) MCG/ACT inhaler Inhale 2 puffs into the lungs every 6 (six) hours as needed. 18 g 2   amLODipine  (NORVASC ) 5 MG tablet Take 1 tablet (5 mg total) by mouth daily. 180 tablet 3   aspirin  81 MG tablet Take 81 mg by mouth daily.     ezetimibe  (ZETIA ) 10 MG tablet Take 1 tablet (10 mg total) by mouth daily. 90 tablet 3   losartan  (COZAAR ) 100 MG tablet TAKE 1 TABLET BY MOUTH EVERY DAY 90 tablet 0   sertraline  (ZOLOFT ) 100 MG tablet TAKE 1 TABLET BY MOUTH EVERY DAY 90 tablet 1   Fluticasone -Umeclidin-Vilant (TRELEGY ELLIPTA ) 100-62.5-25 MCG/ACT AEPB Inhale 1 puff into the lungs daily. (Patient not  taking: Reported on 05/29/2024)     No current facility-administered medications for this visit.    OBJECTIVE: Vitals:   05/29/24 1038 05/29/24 1039  BP: (!) 162/87 (!) 148/80  Pulse: (!) 58   Resp: 18   Temp: 97.8 F (36.6 C)   SpO2: 98%      Body mass index is 28.87 kg/m.    ECOG FS:0 - Asymptomatic  General: Well-developed, well-nourished, no acute distress. Eyes: Pink conjunctiva, anicteric sclera. HEENT: Normocephalic, moist mucous membranes. Lungs: No audible wheezing or coughing. Heart: Regular rate and rhythm. Abdomen: Soft, nontender, no obvious distention. Musculoskeletal: No edema, cyanosis, or clubbing. Neuro: Alert, answering all questions appropriately. Cranial nerves grossly intact. Skin: No rashes or petechiae noted. Psych: Normal affect.  LAB RESULTS:  Lab Results  Component Value Date   NA 137 04/19/2024   K 4.7 04/19/2024   CL 99 04/19/2024   CO2 23 04/19/2024   GLUCOSE 89 04/19/2024   BUN 14 04/19/2024   CREATININE 1.25 04/19/2024   CALCIUM 9.6  04/19/2024   PROT 6.9 04/19/2024   ALBUMIN  4.4 04/19/2024   AST 20 04/19/2024   ALT 14 04/19/2024   ALKPHOS 61 04/19/2024   BILITOT 0.4 04/19/2024   GFRNONAA >60 06/24/2022   GFRAA 86 08/07/2020    Lab Results  Component Value Date   WBC 9.1 04/19/2024   NEUTROABS 5.3 07/07/2020   HGB 15.7 04/19/2024   HCT 46.9 04/19/2024   MCV 95 04/19/2024   PLT 271 04/19/2024     STUDIES: CT CHEST W CONTRAST Result Date: 05/23/2024 EXAM: CT CHEST WITH CONTRAST 05/18/2024 09:30:43 AM TECHNIQUE: CT of the chest was performed with the administration of 75 mL of iohexol  (OMNIPAQUE ) 300 MG/ML solution. Multiplanar reformatted images are provided for review. Automated exposure control, iterative reconstruction, and/or weight based adjustment of the mA/kV was utilized to reduce the radiation dose to as low as reasonably achievable. COMPARISON: 03/22/2023 CLINICAL HISTORY: follow up lung cancer follow up lung cancer * Tracking Code: BO * FINDINGS: MEDIASTINUM: The central airways are clear. Prominent but not pathologically sized middle mediastinal nodes are unchanged. No mediastinal, hilar, or supraclavicular adenopathy. Pulmonary artery enlargement, outflow tract 3.1 cm. No central pulmonary embolism on this non-dedicated exam. CARDIOVASCULAR: Mild cardiomegaly, without pericardial effusion. Normal aortic caliber. Aortic atherosclerosis. Tortuous descending thoracic aorta. 3 vessel coronary artery calcification. LUNGS AND PLEURA: Status post right upper lobectomy and right middle lobe wedge resection. Mild cylindrical bronchiectasis in the lung bases is likely postinfectious / inflammatory. Scattered bilateral pulmonary nodules are similar. For example, right lower lobe nodule measures 6 mm on image 101 / 3. left upper lobe 3 mm nodule on image 60 / 3. No pneumothorax or pleural fluid. SOFT TISSUES/BONES: Rib defects may be posttraumatic or postsurgical. UPPER ABDOMEN: Stones in the gallbladder fundus. Normal  adrenal glands. IMPRESSION: 1. Status post right upper lobectomy and right middle lobe wedge resection. No evidence of recurrent or metastatic disease. 2. Ongoing stability of bilateral pulmonary nodules, favoring benign etiology. 3. Pulmonary artery enlargement, suggestive of pulmonary arterial hypertension. 4. Aortic atherosclerosis and coronary artery atherosclerosis. 5. Cholelithiasis. Electronically signed by: Rockey Kilts MD 05/23/2024 10:33 AM EST RP Workstation: [id number]    ASSESSMENT: Stage Ib adenocarcinoma of the lung.  PLAN:    Stage Ib adenocarcinoma of the lung: Patient underwent surgical resection on May 28, 2022.  Patient has slight invasion of pleura, but no metastatic disease noted in lymph nodes.  He did not require adjuvant XRT or chemotherapy.  His most recent CT scan on May 23, 2024 reviewed independently and reported as above with no obvious evidence of recurrent or progressive disease.  No intervention is needed.  Continue yearly imaging and evaluation by APP for a total of 5 years through November 2028. Laryngeal cancer: Patient diagnosed with a stage I squamous cell carcinoma in 2019 that was treated with XRT only.  Unclear if patient continues to follow-up with ENT. Hypertension: Patient's blood pressure is moderately elevated today.  Continue monitoring and treatment per primary care.    Patient expressed understanding and was in agreement with this plan. He also understands that He can call clinic at any time with any questions, concerns, or complaints.    Cancer Staging  Lung cancer [facility]) Staging form: Lung, AJCC 8th Edition - Clinical stage from 06/11/2022: Stage IB (cT2a, cN0, cM0) - Signed by Jacobo Evalene PARAS, MD on 06/11/2022 Stage prefix: Initial diagnosis   Evalene PARAS Jacobo, MD   05/29/2024 11:21 AM

## 2024-05-31 ENCOUNTER — Telehealth: Payer: Self-pay | Admitting: Oncology

## 2024-05-31 ENCOUNTER — Encounter: Payer: Self-pay | Admitting: Oncology

## 2024-05-31 NOTE — Telephone Encounter (Signed)
 Called pt to sched CT - pt confirmed date/time/location - req appt reminder via mail - LH

## 2024-06-08 NOTE — Progress Notes (Signed)
 Jeffrey Torres                                          MRN: [id number]   06/08/2024   The [facility] Quality Team Specialist reviewed this patient medical record for the purposes of chart review for care gap closure. The following were reviewed: chart review for care gap closure-controlling blood pressure.    [facility]BCI Quality Team

## 2024-06-15 ENCOUNTER — Telehealth: Payer: Self-pay

## 2024-06-15 NOTE — Telephone Encounter (Signed)
 Left pt message asking to call back to schedule appt. Explain to pt that we are scheduling in [place] and feb as of now.

## 2024-06-15 NOTE — Progress Notes (Signed)
 Jeffrey Torres                                          MRN: [id number]   06/15/2024   The [facility] Quality Team Specialist reviewed this patient medical record for the purposes of chart review for care gap closure. The following were reviewed: chart review for care gap closure-controlling blood pressure.    [facility] Quality Team

## 2024-06-20 NOTE — Progress Notes (Signed)
 Jeffrey Torres                                          MRN: [id number]   06/20/2024   The [facility] Quality Team Specialist reviewed this patient medical record for the purposes of chart review for care gap closure. The following were reviewed: chart review for care gap closure-controlling blood pressure.    [facility] Quality Team

## 2024-06-24 ENCOUNTER — Emergency Department

## 2024-06-24 ENCOUNTER — Other Ambulatory Visit: Payer: Self-pay

## 2024-06-24 DIAGNOSIS — I480 Paroxysmal atrial fibrillation: Secondary | ICD-10-CM | POA: Diagnosis not present

## 2024-06-24 DIAGNOSIS — M25571 Pain in right ankle and joints of right foot: Secondary | ICD-10-CM | POA: Diagnosis present

## 2024-06-24 DIAGNOSIS — W010XXA Fall on same level from slipping, tripping and stumbling without subsequent striking against object, initial encounter: Secondary | ICD-10-CM | POA: Insufficient documentation

## 2024-06-24 DIAGNOSIS — S82431A Displaced oblique fracture of shaft of right fibula, initial encounter for closed fracture: Secondary | ICD-10-CM | POA: Diagnosis not present

## 2024-06-24 DIAGNOSIS — R55 Syncope and collapse: Secondary | ICD-10-CM | POA: Diagnosis not present

## 2024-06-24 DIAGNOSIS — F418 Other specified anxiety disorders: Secondary | ICD-10-CM | POA: Insufficient documentation

## 2024-06-24 DIAGNOSIS — I2510 Atherosclerotic heart disease of native coronary artery without angina pectoris: Secondary | ICD-10-CM | POA: Insufficient documentation

## 2024-06-24 DIAGNOSIS — J449 Chronic obstructive pulmonary disease, unspecified: Secondary | ICD-10-CM | POA: Insufficient documentation

## 2024-06-24 DIAGNOSIS — N179 Acute kidney failure, unspecified: Secondary | ICD-10-CM | POA: Diagnosis not present

## 2024-06-24 DIAGNOSIS — Y92017 Garden or yard in single-family (private) house as the place of occurrence of the external cause: Secondary | ICD-10-CM | POA: Insufficient documentation

## 2024-06-24 DIAGNOSIS — F17210 Nicotine dependence, cigarettes, uncomplicated: Secondary | ICD-10-CM | POA: Insufficient documentation

## 2024-06-24 DIAGNOSIS — Z79899 Other long term (current) drug therapy: Secondary | ICD-10-CM | POA: Insufficient documentation

## 2024-06-24 DIAGNOSIS — Z8521 Personal history of malignant neoplasm of larynx: Secondary | ICD-10-CM | POA: Diagnosis not present

## 2024-06-24 DIAGNOSIS — I10 Essential (primary) hypertension: Secondary | ICD-10-CM | POA: Diagnosis not present

## 2024-06-24 LAB — BASIC METABOLIC PANEL WITH GFR
Anion gap: 15 (ref 5–15)
BUN: 36 mg/dL — ABNORMAL HIGH (ref 8–23)
CO2: 22 mmol/L (ref 22–32)
Calcium: 9.2 mg/dL (ref 8.9–10.3)
Chloride: 103 mmol/L (ref 98–111)
Creatinine, Ser: 1.95 mg/dL — ABNORMAL HIGH (ref 0.61–1.24)
GFR, Estimated: 35 mL/min — ABNORMAL LOW
Glucose, Bld: 113 mg/dL — ABNORMAL HIGH (ref 70–99)
Potassium: 3.7 mmol/L (ref 3.5–5.1)
Sodium: 140 mmol/L (ref 135–145)

## 2024-06-24 LAB — CBC
HCT: 43.2 % (ref 39.0–52.0)
Hemoglobin: 14.9 g/dL (ref 13.0–17.0)
MCH: 31.5 pg (ref 26.0–34.0)
MCHC: 34.5 g/dL (ref 30.0–36.0)
MCV: 91.3 fL (ref 80.0–100.0)
Platelets: 246 K/uL (ref 150–400)
RBC: 4.73 MIL/uL (ref 4.22–5.81)
RDW: 12.9 % (ref 11.5–15.5)
WBC: 11.8 K/uL — ABNORMAL HIGH (ref 4.0–10.5)
nRBC: 0 % (ref 0.0–0.2)

## 2024-06-24 LAB — TROPONIN T, HIGH SENSITIVITY: Troponin T High Sensitivity: 29 ng/L — ABNORMAL HIGH (ref 0–19)

## 2024-06-24 NOTE — ED Triage Notes (Signed)
 Pt presents for syncopal fall with LOC. Struck head on the corner of a chair. LOC last less than 5 seconds. Turned right ankle during the event. Endorsing chest pain and dizziness. Denies recent illness. A&Ox4

## 2024-06-25 ENCOUNTER — Emergency Department

## 2024-06-25 ENCOUNTER — Observation Stay: Admission: EM | Admit: 2024-06-25 | Discharge: 2024-06-27 | Disposition: A

## 2024-06-25 ENCOUNTER — Observation Stay

## 2024-06-25 DIAGNOSIS — S82891A Other fracture of right lower leg, initial encounter for closed fracture: Secondary | ICD-10-CM

## 2024-06-25 DIAGNOSIS — Z87891 Personal history of nicotine dependence: Secondary | ICD-10-CM

## 2024-06-25 DIAGNOSIS — R55 Syncope and collapse: Principal | ICD-10-CM | POA: Diagnosis present

## 2024-06-25 DIAGNOSIS — F418 Other specified anxiety disorders: Secondary | ICD-10-CM | POA: Diagnosis present

## 2024-06-25 DIAGNOSIS — Z8679 Personal history of other diseases of the circulatory system: Secondary | ICD-10-CM

## 2024-06-25 DIAGNOSIS — I10 Essential (primary) hypertension: Secondary | ICD-10-CM | POA: Diagnosis present

## 2024-06-25 DIAGNOSIS — N179 Acute kidney failure, unspecified: Secondary | ICD-10-CM | POA: Diagnosis present

## 2024-06-25 DIAGNOSIS — J449 Chronic obstructive pulmonary disease, unspecified: Secondary | ICD-10-CM

## 2024-06-25 LAB — RESPIRATORY PANEL BY PCR

## 2024-06-25 LAB — URINALYSIS, COMPLETE (UACMP) WITH MICROSCOPIC
Bacteria, UA: NONE SEEN
Bilirubin Urine: NEGATIVE
Glucose, UA: NEGATIVE mg/dL
Ketones, ur: NEGATIVE mg/dL
Leukocytes,Ua: NEGATIVE
Nitrite: NEGATIVE
Protein, ur: NEGATIVE mg/dL
Specific Gravity, Urine: 1.018 (ref 1.005–1.030)
Squamous Epithelial / HPF: 0 /HPF (ref 0–5)
pH: 5 (ref 5.0–8.0)

## 2024-06-25 LAB — TROPONIN T, HIGH SENSITIVITY: Troponin T High Sensitivity: 20 ng/L — ABNORMAL HIGH (ref 0–19)

## 2024-06-25 MED ORDER — ACETAMINOPHEN 325 MG PO TABS: 650.0000 mg | ORAL_TABLET | Freq: Four times a day (QID) | ORAL | Status: DC | PRN

## 2024-06-25 MED ORDER — EZETIMIBE 10 MG PO TABS
10.0000 mg | ORAL_TABLET | Freq: Every day | ORAL | Status: DC
  Administered 2024-06-25 – 2024-06-27 (×3): 10 mg via ORAL
  Filled 2024-06-25: qty 1

## 2024-06-25 MED ORDER — ACETAMINOPHEN 650 MG RE SUPP: 650.0000 mg | Freq: Four times a day (QID) | RECTAL | Status: DC | PRN

## 2024-06-25 MED ORDER — MORPHINE SULFATE (PF) 2 MG/ML IV SOLN
2.0000 mg | INTRAVENOUS | Status: DC | PRN
  Administered 2024-06-25: 2 mg via INTRAVENOUS
  Filled 2024-06-25: qty 1

## 2024-06-25 MED ORDER — SODIUM CHLORIDE 0.9 % IV SOLN: INTRAVENOUS | Status: DC

## 2024-06-25 MED ORDER — ASPIRIN 81 MG PO CHEW
81.0000 mg | CHEWABLE_TABLET | Freq: Every day | ORAL | Status: DC
  Administered 2024-06-25 – 2024-06-27 (×3): 81 mg via ORAL
  Filled 2024-06-25: qty 1

## 2024-06-25 MED ORDER — AMLODIPINE BESYLATE 5 MG PO TABS
5.0000 mg | ORAL_TABLET | Freq: Every day | ORAL | Status: DC
  Administered 2024-06-25 – 2024-06-27 (×3): 5 mg via ORAL
  Filled 2024-06-25: qty 1

## 2024-06-25 MED ORDER — SODIUM CHLORIDE 0.9 % IV BOLUS
1000.0000 mL | Freq: Once | INTRAVENOUS | Status: AC
  Administered 2024-06-25: 1000 mL via INTRAVENOUS

## 2024-06-25 MED ORDER — ONDANSETRON HCL 4 MG/2ML IJ SOLN: 4.0000 mg | Freq: Four times a day (QID) | INTRAMUSCULAR | Status: DC | PRN

## 2024-06-25 MED ORDER — BUDESON-GLYCOPYRROL-FORMOTEROL 160-9-4.8 MCG/ACT IN AERO
2.0000 | INHALATION_SPRAY | Freq: Two times a day (BID) | RESPIRATORY_TRACT | Status: DC
  Administered 2024-06-25 – 2024-06-27 (×5): 2 via RESPIRATORY_TRACT
  Filled 2024-06-25: qty 5.9

## 2024-06-25 MED ORDER — ONDANSETRON HCL 4 MG PO TABS: 4.0000 mg | ORAL_TABLET | Freq: Four times a day (QID) | ORAL | Status: DC | PRN

## 2024-06-25 MED ORDER — MAGNESIUM HYDROXIDE 400 MG/5ML PO SUSP: 30.0000 mL | Freq: Every day | ORAL | Status: DC | PRN

## 2024-06-25 MED ORDER — SERTRALINE HCL 50 MG PO TABS
100.0000 mg | ORAL_TABLET | Freq: Every day | ORAL | Status: DC
  Administered 2024-06-25 – 2024-06-27 (×3): 100 mg via ORAL
  Filled 2024-06-25: qty 2

## 2024-06-25 MED ORDER — ACETAMINOPHEN 500 MG PO TABS
1000.0000 mg | ORAL_TABLET | Freq: Once | ORAL | Status: AC
  Administered 2024-06-25: 1000 mg via ORAL
  Filled 2024-06-25: qty 2

## 2024-06-25 MED ORDER — ENOXAPARIN SODIUM 40 MG/0.4ML IJ SOSY
40.0000 mg | PREFILLED_SYRINGE | INTRAMUSCULAR | Status: DC
  Administered 2024-06-25 – 2024-06-27 (×3): 40 mg via SUBCUTANEOUS
  Filled 2024-06-25: qty 0.4

## 2024-06-25 MED ORDER — ALBUTEROL SULFATE HFA 108 (90 BASE) MCG/ACT IN AERS: 2.0000 | INHALATION_SPRAY | Freq: Four times a day (QID) | RESPIRATORY_TRACT | Status: DC | PRN

## 2024-06-25 NOTE — Plan of Care (Signed)

## 2024-06-25 NOTE — ED Provider Notes (Signed)
 "  [facility] Provider Note    Event Date/Time   First MD Initiated Contact with Patient 06/25/24 0216     (approximate)   History   Dizziness and Fall  Pt presents for syncopal fall with LOC. Struck head on the corner of a chair. LOC last less than 5 seconds. Turned right ankle during the event. Endorsing chest pain and dizziness. Denies recent illness. A&Ox4   HPI Jeffrey Torres is a 78 y.o. male PMH paroxysmal A-fib not on anticoagulation, hypertension, OSA, lung cancer s/p surgical resection, CAD, COPD, anxiety presents for evaluation after a syncopal episode - Patient reported  that around 2 PM this afternoon he started feeling very lightheaded and then woke up on the ground.  Believes he did hit his head on a metal chair and landed on concrete.  Has had a history of syncope in the past about 2 years ago when he was dehydrated per his report. - Primarily complaining of right ankle pain.  Did also note a knot to his forehead. - Denies any recent infectious symptoms - Does endorse some mild chest discomfort which has been ongoing since this afternoon.  No known history of DVT/PE.  No leg swelling.  No pleuritic discomfort.       Physical Exam   Triage Vital Signs: ED Triage Vitals  Encounter Vitals Group     BP 06/24/24 2258 135/76     Girls Systolic BP Percentile --      Girls Diastolic BP Percentile --      Boys Systolic BP Percentile --      Boys Diastolic BP Percentile --      Pulse Rate 06/24/24 2258 69     Resp 06/24/24 2258 18     Temp 06/24/24 2258 98.3 F (36.8 C)     Temp Source 06/24/24 2258 Oral     SpO2 06/24/24 2258 96 %     Weight 06/24/24 2259 205 lb (93 kg)     Height 06/24/24 2259 5' 10 (1.778 m)     Head Circumference --      Peak Flow --      Pain Score 06/24/24 2259 7     Pain Loc --      Pain Education --      Exclude from Growth Chart --     Most recent vital signs: Vitals:   06/24/24 2258 06/25/24 0300  BP:  135/76 (!) 154/78  Pulse: 69 (!) 54  Resp: 18 15  Temp: 98.3 F (36.8 C)   SpO2: 96%      General: Awake, no distress.  HEENT: + Contusion to forehead.  No other evidence of trauma.  No midline neck pain.  Full neck range of motion. CV:  Good peripheral perfusion. RRR, RP 2+ Resp:  Normal effort. CTAB Abd:  No distention. Nontender to deep palpation throughout RLE: + Moderate swelling to right lateral ankle.  Pedal pulse 2+.  SI LT, wiggles toes without difficulty.  No open wounds.   Other:  No tenderness elsewhere throughout bilateral upper and lower extremities   ED Results / Procedures / Treatments   Labs (all labs ordered are listed, but only abnormal results are displayed) Labs Reviewed  BASIC METABOLIC PANEL WITH GFR - Abnormal; Notable for the following components:      Result Value   Glucose, Bld 113 (*)    BUN 36 (*)    Creatinine, Ser 1.95 (*)    GFR, Estimated 35 (*)  All other components within normal limits  CBC - Abnormal; Notable for the following components:   WBC 11.8 (*)    All other components within normal limits  TROPONIN T, HIGH SENSITIVITY - Abnormal; Notable for the following components:   Troponin T High Sensitivity 29 (*)    All other components within normal limits  TROPONIN T, HIGH SENSITIVITY     EKG  Ecg = sinus rhythm, rate 67, no gross ST elevation or depression, isolated T wave inversion in lead III is nonspecific, left axis deviation, normal intervals.  No clear evidence of ischemia no arrhythmia my interpretation.   RADIOLOGY Radiology interpreted by myself and radiology reports reviewed.  Notable for right ankle fracture.    PROCEDURES:  Critical Care performed: No  Procedures   MEDICATIONS ORDERED IN ED: Medications  acetaminophen  (TYLENOL ) tablet 1,000 mg (has no administration in time range)  sodium chloride  0.9 % bolus 1,000 mL (has no administration in time range)     IMPRESSION / MDM / ASSESSMENT AND PLAN / ED  COURSE  I reviewed the triage vital signs and the nursing notes.                              DDX/MDM/AP: Differential diagnosis includes, but is not limited to, apparent syncopal episode--consider orthostasis/dehydration, transient arrhythmia, ACS.  Doubt PE at this time.  Consider possibility of intracranial hemorrhage, skull fracture, C-spine fracture.  Also high concern for right ankle fracture.  No evidence of other traumatic injuries at this time.  Plan: - Labs - EKG - Chest x-ray - CT head, CT C-spine - X-ray right ankle - Tylenol  - N.p.o.  Patient's presentation is most consistent with acute presentation with potential threat to life or bodily function.  The patient is on the cardiac monitor to evaluate for evidence of arrhythmia and/or significant heart rate changes.  ED course below.  Workup notable for AKI as well as mild troponin elevation, trending troponin.  Giving IV fluid.  CT head negative, CT C-spine negative and C-spine cleared clinically.  X-ray with right distal fibula fracture with concern for extension into tibial plafond.  Podiatry consulted, recommend CT, boot, and they will evaluate in the morning.  Admitting to hospitalist service for syncope, AKI.  Clinical Course as of 06/25/24 9687  [facility] Jun 25, 2024  0242 CBC with mild leukocytosis, otherwise unremarkable [MM]  0243 BMP with AKI [MM]  0243 CXR: IMPRESSION: 1. Small left pleural effusion. 2. Postsurgical changes of right lung partial resection with associated volume loss. 3. Nodular opacity at the left first costochondral junction consistent with chronic hypertrophic spurring, stable from prior CT. 4. Atherosclerotic calcifications.   [MM]  0243 CTH, CTCspine: IMPRESSION: 1. No acute intracranial abnormality. 2. No acute fracture or traumatic malalignment of the cervical spine.   [MM]  0244 XR R ankle: IMPRESSION: 1. Oblique fracture through the distal fibular diaphysis with  minimal displacement, extending to the level of the tibial plafond. 2. Mild diffuse soft tissue swelling.   [MM]  0255 Paging podiatry [MM]  [phone] D/w podiatry, Dr. Greig Blush NWB RLE CT ankle Defer splint, can place boot for now Will eval in AM [MM]  0312 Hospitalist consult order placed [MM]    Clinical Course User Index [MM] Clarine Ozell LABOR, MD     FINAL CLINICAL IMPRESSION(S) / ED DIAGNOSES   Final diagnoses:  Syncope, unspecified syncope type  AKI (acute kidney injury)  Closed  fracture of right ankle, initial encounter     Rx / DC Orders   ED Discharge Orders     None        Note:  This document was prepared using Dragon voice recognition software and may include unintentional dictation errors.   Clarine Ozell LABOR, MD 06/25/24 [phone]  "

## 2024-06-25 NOTE — Plan of Care (Signed)
  Problem: Education: Goal: Knowledge of General Education information will improve Description: Including pain rating scale, medication(s)/side effects and non-pharmacologic comfort measures Outcome: Progressing   Problem: Health Behavior/Discharge Planning: Goal: Ability to manage health-related needs will improve Outcome: Progressing   Problem: Activity: Goal: Risk for activity intolerance will decrease Outcome: Progressing   Problem: Coping: Goal: Level of anxiety will decrease Outcome: Progressing   Problem: Nutrition: Goal: Adequate nutrition will be maintained Outcome: Not Progressing

## 2024-06-25 NOTE — Consult Note (Addendum)
 PODIATRIC SURGERY: CONSULT NOTE  Consulting Physician: Dr. Clarine   Reason for Consult: R ankle fracture    Jeffrey Torres is a 78 y.o. male with a PMH significant for RUL lung adenocarcinoma stage Ib status post surgical resection, history of peritoneal cancer, HTN, COPD CAD, PAF presents to clinic with CC of right ankle pain s/p injury /GLF with LOC  -unwitnessed (Onset: 06/24/2024).   History/ Background: Patient reports 06/24/2024 that he was working out in the garden and felt like he was possibly dehydrated and woke up on the ground, sustaining a ground-level fall with head injury to anterior forehead.  He was able to get up and felt that his right ankle was sore primarily to the lateral aspect.  He was walking around for the rest of the day and felt that he suffered an ankle sprain -after laying down for couple of hours patient woke up and felt as if he had significant ' deeper' ankle pain as well as the head injury prompting him to get evaluated by the emergency room department.  ED then obtained a ankle x-ray that showed a fairly nondisplaced Weber B type fracture and remote medial malleolar fracture (versus accessory inferior 2 MM).  Podiatry consulted for right closed ankle fracture.   Able to walk immediately after injury: Yes Skin breaks: No Bruising: Yes Associated swelling: Yes Additional Injuries/ Areas of pain: Yes yes anterior forehead Prior examination: Yes emergency room Pain level: Currently at a 3 post splinting/10 ; Pain is characterized as: Sore and isolated to the lateral aspect of the ankle How are symptoms (i.e. pain/swelling/wound}} currently controlled/ addressed?:  Posterior splint with Ace bandaging. Currently transporting in: Nonweightbearing to the right lower extremity however he has been able to ambulate around his room in the posterior splint with sugar-tong's. No other acute pedal complaints at this time.   Of note:  Patient is an everyday smoker. Patient is  a diabetic. *Per patient he is on anticoagulants currently.   Past Medical History:  Diagnosis Date   Acute metabolic encephalopathy 07/31/2020   Anxiety    Arthritis    CAD (coronary artery disease)    a. 02/2022 Chest CT: LAD/RCA Ca2+; b. 03/2022 Lexi MV: no ischemia/infarct; c. 02/2022 Echo: EF 60-65%, AoV sclerosis.   Cancer [facility])    larynx ca, surgery and radiation   COPD (chronic obstructive pulmonary disease) (HCC)    GERD (gastroesophageal reflux disease)    Heart murmur    a. 02/2022 Echo: AoV sclerosis.   Hernia, inguinal, bilateral    History of chicken pox    History of kidney stones    History of measles    History of mumps    History of shingles    History of tobacco abuse    HOH (hard of hearing)    Hypertension    Macular degeneration of both eyes    Motion sickness    ocean boat   OSA (obstructive sleep apnea)    a. Does not tolerate CPAP.   Pneumonia due to COVID-19 virus 07/31/2020   PVC (premature ventricular contraction)     @MEDICATIONLIST @  Allergies[1]  @SURGICALHXCOLLAPSE @   PHYSICAL EXAMINATION:  BP (!) 142/58 (BP Location: Left Arm)   Pulse (!) 57   Temp 98.2 F (36.8 C)   Resp 17   Ht 5' 10 (1.778 m)   Wt 93 kg   SpO2 96%   BMI 29.42 kg/m   Constitutional:  Patient appears of normal development and good nutritional  status for stated age, well-groomed. Phonating appropriately.  Psychiatric: Overall Disposition. Alert. AOX3.  Resp: RR WNL ~14 ; Nonlabored breathing on RA.  Ext: Able to move all extremities.   FOCUSED LOWER EXTREMITY EXAMINATION: Limited examination to distal aspect of digits and proximal calf secondary to current splint Neurological:  Gross protective sensation intact.  No focal motor or sensory deficits identified.  WNL / appropriate response to external stimuli.   Vascular:  Capillary Filling Times: WNL  Edema/ warmth: None noted secondary to splint, patient relays that he did have mild swelling primarily  noted to the lateral aspect of the ankle  Dermatological:  Skin Quality: WNL  No skin abrasion to the lower extremity IDS: C/D/I  Nails: Normotrophic  Ecchymosis: Unable to evaluate  Musculoskeletal:  Muscle strength: deferred 2/2 acute injury  Able to wiggle toes ++ Able to DF/PF ankle  Global foot - TTP: None noted Ankle - TTP:  *Able to palpate through the splint to the medial malleolus and inferior medial malleolus along the deltoid ligament region as well as medial malleolar region, patient does not have any tenderness to palpation to these areas.  Patient does not have TTP to the navicular or styloid process.  Endorses TTP along the lateral aspect of the fibula with palpation.  Unable to assess syndesmotic stability given splint.  Imaging: Narrative & Impression  EXAM: 3 OR MORE VIEW(S) XRAY OF THE RIGHT ANKLE 06/24/2024 11:39:35 PM   CLINICAL HISTORY: Fall   COMPARISON: None available.   FINDINGS:   BONES AND JOINTS: Oblique fracture through the distal fibular diaphysis with minimal displacement. Medial malleolar accessory ossicle versus remote nonunion fracture fragment. Fibular fracture plane extends to the level of the tibial plafond. Small superior and inferior calcaneal spurs. No malalignment.   SOFT TISSUES: Mild diffuse soft tissue swelling.   IMPRESSION: 1. Oblique fracture through the distal fibular diaphysis with minimal displacement, extending to the level of the tibial plafond. 2. Mild diffuse soft tissue swelling.      ASSESSMENT/ PLAN:   - Weber B fracture fibula, mildly displaced, right ankle  - Imaging was obtained: Impression in PE consistent with diagnosis.  Given the extent / severity, and location of the fracture, we discussed that it is reasonable and within the standard of care to treat the fracture conservatively / while managing symptoms, barring no complications and proper immobilization is utilized. Discussed physiology of bone  healing and that this process can be upwards of 6-8 weeks for bone to heal. In order to heal in appropriate alignment, it is necessary to protect the bone during the healing process. Patient verbalized their understanding of the fracture healing process and potential factors that can delay/ prevent healing (improper immobilization, smoking, vitamin D deficiency, bone quality - ie osteoporosis etc).   ----SUPPORT/PROTECTION----  *Currently patient appreciates the comfort of the posterior splint.  He has ambulated within his room with a walker and feels safe and comfortable doing so in this apparatus.  I advised that he can keep this apparatus and weight-bear as tolerated having pain as a limiting factor then we will check x-rays in clinic to ensure that there is stability this weightbearing will be considered ' road test.'  Patient is aware and agreeable to this.  - WBAT in supportive apparatus, I advised that there is minimal weightbearing in either posterior splint versus cam boot (tall) x 6 weeks (to treat as cast)  --- PAIN CONTROL--- - Recommended OTC Voltaren 1% gel/ cream.  To apply daily,  up to 4x/day - 2 minute application time to affected area.   ---SWELLING---  - Recommend initiation of compression socks beginning at lowest level pressure gradient, ex. 10-33mmHg. To increase pressure gradient (i.e. from 10-41mmHg to 15-72mmHg) incrementally as tolerated and if symptomatic relief/ benefit is obtained. To apply socks at beginning of day, especially if day holds high activity demands, and remove at night time. - Discussed that -- should patient feel throbbing to injured area or not increased swelling --- advised to scale back activity/ time of LE dependency.  - In addition we discussed S/SX of acute DVT.  Patient advised to seek urgent medical attention should those signs /symptoms present. For this particular fracture, risks and benefits of DVT ppx were weighed and DVT ppx was deemed unnecessary  given the mobility of the patient with cautioned monitoring signs.   -Dispo: Per PT/OT recommendations or pending hospitalist clearance.  Patient to be followed up in outpatient podiatry clinic in 1 to 2 weeks for weightbearing x-rays      [1]  Allergies Allergen Reactions   Penicillins Anaphylaxis and Swelling    Did it involve swelling of the face/tongue/throat, SOB, or low BP? Yes Did it involve sudden or severe rash/hives, skin peeling, or any reaction on the inside of your mouth or nose? No Did you need to seek medical attention at a hospital or doctor's office? No When did it last happen?      10 + years ago If all above answers are NO, may proceed with cephalosporin use.    Atorvastatin Other (See Comments)    myalgia   Trazodone  And Nefazodone     Caused fainting spells

## 2024-06-25 NOTE — ED Notes (Signed)
 Pt's right leg placed in posterior short-leg with stirrup with help of NTs.

## 2024-06-25 NOTE — H&P (Signed)
 " History and Physical    [name] FMW:[id number] DOB: 21-Apr-1946 DOA: 06/25/2024  PCP: Jeffrey Nancyann BRAVO, MD (Confirm with patient/family/NH records and if not entered, this has to be entered at [facility] point of entry) Patient coming from: Home  I have personally briefly reviewed patient's old medical records in [facility]  Chief Complaint: I passed out  HPI: Jeffrey Torres is a 78 y.o. male with medical history significant of RUL lung adenocarcinoma stage Ib status post surgical resection, history of peritoneal cancer, HTN, COPD CAD, PAF not on anticoagulation presented with syncope and right ankle fracture.  Yesterday afternoon, patient was working in his yard, after did some yard work, he started to feel  hot, he went to sit down on the bench for few minutes and while standing up he started to feel lightheadedness and fell down on the grass.  He woke up himself and estimated LOC about 5 to 10 seconds.  He had excruciating right ankle pain after the fall.  Did not have urine or bowel movement incontinence.  He felt that last few days he has been dehydrated for that his urine color has been turned darker and with decreased urine output.  He has no history of BPH however described he has had weak stream.  Denies any back pain no dysuria no fever or chills.  History of hyperal pressure and recently blood pressure medications adjusted by cardiology, Cardizem  was discontinued for bradycardia and patient was started on amlodipine .  And losartan  was continued.  ED Course: Afebrile, borderline bradycardia blood pressure 140/50 O2 saturation 98% on room air.  Blood work showed hemoglobin 14.9 WBC 11.8 troponin 20> 29.  Creatinine 1.9 compared to baseline 1.1-1.2 bicarb 22K 3.7.  EKG showed sinus rhythm, prolonged bradycardia.  Review of Systems: As per HPI otherwise 14 point review of systems negative.    Past Medical History:  Diagnosis Date   Acute metabolic encephalopathy 07/31/2020    Anxiety    Arthritis    CAD (coronary artery disease)    a. 02/2022 Chest CT: LAD/RCA Ca2+; b. 03/2022 Lexi MV: no ischemia/infarct; c. 02/2022 Echo: EF 60-65%, AoV sclerosis.   Cancer [facility])    larynx ca, surgery and radiation   COPD (chronic obstructive pulmonary disease) (HCC)    GERD (gastroesophageal reflux disease)    Heart murmur    a. 02/2022 Echo: AoV sclerosis.   Hernia, inguinal, bilateral    History of chicken pox    History of kidney stones    History of measles    History of mumps    History of shingles    History of tobacco abuse    HOH (hard of hearing)    Hypertension    Macular degeneration of both eyes    Motion sickness    ocean boat   OSA (obstructive sleep apnea)    a. Does not tolerate CPAP.   Pneumonia due to COVID-19 virus 07/31/2020   PVC (premature ventricular contraction)     Past Surgical History:  Procedure Laterality Date   CARDIAC CATHETERIZATION  2010   normal per patient report   CATARACT EXTRACTION W/PHACO Right 07/18/2019   Procedure: CATARACT EXTRACTION PHACO AND INTRAOCULAR LENS PLACEMENT (IOC) RIGHT 4.54 00:29.7;  Surgeon: Jaye Fallow, MD;  Location: [facility];  Service: Ophthalmology;  Laterality: Right;   CATARACT EXTRACTION W/PHACO Left 08/08/2019   Procedure: CATARACT EXTRACTION PHACO AND INTRAOCULAR LENS PLACEMENT (IOC) LEFT;  Surgeon: Jaye Fallow, MD;  Location: [facility] ORS;  Service: Ophthalmology;  Laterality: Left;  Lot #[id number] H US : 00:34.5 CDE: 4.75   COLONOSCOPY  06/24/2006   Dr. Dessa. Multiple benign appearing 5mm polyps in the cecum and in the rectum. -Three 8mm polyps in the transversed colon, in the tranverse colon, proximal and in the distal transverse colon. Resected and retrieved.   COLONOSCOPY WITH PROPOFOL  N/A 03/12/2020   Procedure: COLONOSCOPY WITH PROPOFOL ;  Surgeon: Jinny Carmine, MD;  Location: [facility] ENDOSCOPY;  Service: Endoscopy;  Laterality: N/A;   CYSTOSCOPY WITH HOLMIUM LASER LITHOTRIPSY   2001   kidney stones removed, [facility]   HERNIA REPAIR     umbilical and bilateral inguninal   INTERCOSTAL NERVE BLOCK  05/28/2022   Procedure: INTERCOSTAL NERVE BLOCK;  Surgeon: Kerrin Elspeth BROCKS, MD;  Location: [facility] OR;  Service: Thoracic;;   LARYNGOSCOPY Right 04/01/2017   Procedure: SUSPENSION LARYNGOSCOPY WITH MICROFLAP EXCISION;  Surgeon: Milissa Hamming, MD;  Location: [facility] ORS;  Service: ENT;  Laterality: Right;   LYMPH NODE DISSECTION  05/28/2022   Procedure: LYMPH NODE DISSECTION;  Surgeon: Kerrin Elspeth BROCKS, MD;  Location: [facility] OR;  Service: Thoracic;;   sinus surgery   2002   [place]; removal of polyps   TONSILLECTOMY  1954   Tubular adenoma removed  06/24/2006     reports that he has been smoking cigarettes. He started smoking about 53 years ago. He has a 53 pack-year smoking history. He has never used smokeless tobacco. He reports that he does not currently use alcohol. He reports that he does not use drugs.  Allergies[1]  Family History  Problem Relation Age of Onset   Lung cancer Mother    Heart disease Father      Prior to Admission medications  Medication Sig Start Date End Date Taking? Authorizing Provider  albuterol  (VENTOLIN  HFA) 108 (90 Base) MCG/ACT inhaler Inhale 2 puffs into the lungs every 6 (six) hours as needed. 09/29/22  Yes Tamea Dedra CROME, MD  amLODipine  (NORVASC ) 5 MG tablet Take 1 tablet (5 mg total) by mouth daily. 05/23/24 08/21/24 Yes Agbor-Etang, Redell, MD  aspirin  81 MG tablet Take 81 mg by mouth daily.   Yes [provider]  ezetimibe  (ZETIA ) 10 MG tablet Take 1 tablet (10 mg total) by mouth daily. 05/23/24 08/21/24 Yes Agbor-Etang, Redell, MD  Fluticasone -Umeclidin-Vilant (TRELEGY ELLIPTA ) 100-62.5-25 MCG/ACT AEPB Inhale 1 puff into the lungs daily. Patient taking differently: Inhale 1 puff into the lungs daily as needed. 02/01/23  Yes Tamea Dedra CROME, MD  losartan  (COZAAR ) 100 MG tablet TAKE 1 TABLET BY MOUTH EVERY DAY 05/17/24   Yes Darliss Redell, MD  sertraline  (ZOLOFT ) 100 MG tablet TAKE 1 TABLET BY MOUTH EVERY DAY 04/04/24  Yes Jeffrey Nancyann BRAVO, MD    Physical Exam: Vitals:   06/25/24 0300 06/25/24 0400 06/25/24 0506 06/25/24 0722  BP: (!) 154/78 (!) 143/62 137/68 (!) 142/58  Pulse: (!) 54 (!) 57 (!) 51 (!) 57  Resp: 15 18 18 17   Temp: 98.2 F (36.8 C) 98.6 F (37 C) 98.1 F (36.7 C) 98.2 F (36.8 C)  TempSrc: Oral Oral    SpO2: 97% 99% 98% 96%  Weight:   93 kg   Height:   5' 10 (1.778 m)     Constitutional: NAD, calm, comfortable Vitals:   06/25/24 0300 06/25/24 0400 06/25/24 0506 06/25/24 0722  BP: (!) 154/78 (!) 143/62 137/68 (!) 142/58  Pulse: (!) 54 (!) 57 (!) 51 (!) 57  Resp: 15 18 18 17   Temp: 98.2 F (36.8  C) 98.6 F (37 C) 98.1 F (36.7 C) 98.2 F (36.8 C)  TempSrc: Oral Oral    SpO2: 97% 99% 98% 96%  Weight:   93 kg   Height:   5' 10 (1.778 m)    Eyes: PERRL, lids and conjunctivae normal ENMT: Mucous membranes are moist. Posterior pharynx clear of any exudate or lesions.Normal dentition.  Neck: normal, supple, no masses, no thyromegaly Respiratory: clear to auscultation bilaterally, no wheezing, no crackles. Normal respiratory effort. No accessory muscle use.  Cardiovascular: Regular rate and rhythm, no murmurs / rubs / gallops. No extremity edema. 2+ pedal pulses. No carotid bruits.  Abdomen: no tenderness, no masses palpated. No hepatosplenomegaly. Bowel sounds positive.  Musculoskeletal: nRight ankle in splinter Skin: no rashes, lesions, ulcers. No induration Neurologic: CN 2-12 grossly intact. Sensation intact, DTR normal. Strength 5/5 in all 4.  Psychiatric: Normal judgment and insight. Alert and oriented x 3. Normal mood.    Labs on Admission: I have personally reviewed following labs and imaging studies  CBC: Recent Labs  Lab 06/24/24 2309  WBC 11.8*  HGB 14.9  HCT 43.2  MCV 91.3  PLT 246   Basic Metabolic Panel: Recent Labs  Lab 06/24/24 2309  NA 140   K 3.7  CL 103  CO2 22  GLUCOSE 113*  BUN 36*  CREATININE 1.95*  CALCIUM 9.2   GFR: Estimated Creatinine Clearance: 36.3 mL/min (A) (by C-G formula based on SCr of 1.95 mg/dL (H)). Liver Function Tests: No results for input(s): AST, ALT, ALKPHOS, BILITOT, PROT, ALBUMIN  in the last 168 hours. No results for input(s): LIPASE, AMYLASE in the last 168 hours. No results for input(s): AMMONIA in the last 168 hours. Coagulation Profile: No results for input(s): INR, PROTIME in the last 168 hours. Cardiac Enzymes: No results for input(s): CKTOTAL, CKMB, CKMBINDEX, TROPONINI in the last 168 hours. BNP (last 3 results) No results for input(s): PROBNP in the last 8760 hours. HbA1C: No results for input(s): HGBA1C in the last 72 hours. CBG: No results for input(s): GLUCAP in the last 168 hours. Lipid Profile: No results for input(s): CHOL, HDL, LDLCALC, TRIG, CHOLHDL, LDLDIRECT in the last 72 hours. Thyroid  Function Tests: No results for input(s): TSH, T4TOTAL, FREET4, T3FREE, THYROIDAB in the last 72 hours. Anemia Panel: No results for input(s): VITAMINB12, FOLATE, FERRITIN, TIBC, IRON, RETICCTPCT in the last 72 hours. Urine analysis:    Component Value Date/Time   COLORURINE YELLOW 06/23/2022 0004   APPEARANCEUR HAZY (A) 06/23/2022 0004   LABSPEC 1.020 06/23/2022 0004   PHURINE 5.0 06/23/2022 0004   GLUCOSEU NEGATIVE 06/23/2022 0004   HGBUR NEGATIVE 06/23/2022 0004   BILIRUBINUR NEGATIVE 06/23/2022 0004   KETONESUR NEGATIVE 06/23/2022 0004   PROTEINUR NEGATIVE 06/23/2022 0004   NITRITE NEGATIVE 06/23/2022 0004   LEUKOCYTESUR NEGATIVE 06/23/2022 0004    Radiological Exams on Admission: CT Ankle Right Wo Contrast Result Date: 06/25/2024 EXAM: CT RIGHT ANKLE, WITHOUT IV CONTRAST 06/25/2024 03:24:55 AM TECHNIQUE: Axial images were acquired through the right ankle without IV contrast. Reformatted images were  reviewed. Automated exposure control, iterative reconstruction, and/or weight based adjustment of the mA/kV was utilized to reduce the radiation dose to as low as reasonably achievable. COMPARISON: None provided. CLINICAL HISTORY: Right ankle fracture, eval morphology. FINDINGS: BONES: Oblique minimal comminuted fracture of the distal right fibular metadiaphysis noted extending to the level of the tibial plafond immediately with fracture fragments in near anatomic alignment. Developmental nonunion of the medial malleolus. Distal tibia and hindfoot are otherwise  intact. Small superior plantar calcaneal spurs are present. JOINTS: No dislocation. No ankle effusion. The joint spaces are normal. SOFT TISSUES: Extensive soft tissue swelling superficial to the lateral malleolus noted. No subcutaneous loculated fluid collection identified. Visualized extensor, flexor, and peroneal tendons are intact. Achilles tendon is intact. IMPRESSION: 1. Oblique minimal comminuted fracture of the distal right fibular metadiaphysis extending to the level of the tibial plafond with fracture fragments in near anatomic alignment. 2. Extensive soft tissue swelling superficial to the lateral malleolus, without subcutaneous loculated fluid collection. 3. Developmental nonunion of the medial malleolus. Electronically signed by: Dorethia Molt MD 06/25/2024 04:09 AM EST RP Workstation: [id number]   DG Chest Port 1 View Result Date: 06/25/2024 EXAM: 1 VIEW(S) XRAY OF THE CHEST 06/24/2024 11:39:35 PM COMPARISON: CT 05/18/2024. CLINICAL HISTORY: Chest pain FINDINGS: LUNGS AND PLEURA: Small left pleural effusion. Postsurgical changes of right lung partial resection with associated volume loss are noted. No pneumothorax. HEART AND MEDIASTINUM: Atherosclerotic calcifications are present. No acute abnormality of the cardiac and mediastinal silhouettes. BONES AND SOFT TISSUES: Nodular opacity at the left first costochondral junction is in keeping  with hypertrophic spurring as seen on prior CT examination. No acute fracture. IMPRESSION: 1. Small left pleural effusion. 2. Postsurgical changes of right lung partial resection with associated volume loss. 3. Nodular opacity at the left first costochondral junction consistent with chronic hypertrophic spurring, stable from prior CT. 4. Atherosclerotic calcifications. Electronically signed by: Dorethia Molt MD 06/25/2024 12:28 AM EST RP Workstation: [id number]   CT Head Wo Contrast Result Date: 06/25/2024 EXAM: CT HEAD AND CERVICAL SPINE 06/24/2024 11:21:34 PM TECHNIQUE: CT of the head and cervical spine was performed without the administration of intravenous contrast. Multiplanar reformatted images are provided for review. Automated exposure control, iterative reconstruction, and/or weight based adjustment of the mA/kV was utilized to reduce the radiation dose to as low as reasonably achievable. COMPARISON: None available. CLINICAL HISTORY: fall fall FINDINGS: CT HEAD BRAIN AND VENTRICLES: No acute intracranial hemorrhage. No mass effect or midline shift. No abnormal extra-axial fluid collection. No evidence of acute infarct. No hydrocephalus. ORBITS: No acute abnormality. SINUSES AND MASTOIDS: No acute abnormality. SOFT TISSUES AND SKULL: No acute skull fracture. No acute soft tissue abnormality. CT CERVICAL SPINE BONES AND ALIGNMENT: No acute fracture or traumatic malalignment. Osteopenia. DEGENERATIVE CHANGES: Left C3-C4 and right C4-C5 facet arthropathy. C6-C7 degenerative disc disease with endplate spurring and bilateral foraminal stenosis. SOFT TISSUES: No prevertebral soft tissue swelling. IMPRESSION: 1. No acute intracranial abnormality. 2. No acute fracture or traumatic malalignment of the cervical spine. Electronically signed by: Gilmore Molt 06/25/2024 12:28 AM EST RP Workstation: [id number]   CT Cervical Spine Wo Contrast Result Date: 06/25/2024 EXAM: CT HEAD AND CERVICAL SPINE 06/24/2024  11:21:34 PM TECHNIQUE: CT of the head and cervical spine was performed without the administration of intravenous contrast. Multiplanar reformatted images are provided for review. Automated exposure control, iterative reconstruction, and/or weight based adjustment of the mA/kV was utilized to reduce the radiation dose to as low as reasonably achievable. COMPARISON: None available. CLINICAL HISTORY: fall fall FINDINGS: CT HEAD BRAIN AND VENTRICLES: No acute intracranial hemorrhage. No mass effect or midline shift. No abnormal extra-axial fluid collection. No evidence of acute infarct. No hydrocephalus. ORBITS: No acute abnormality. SINUSES AND MASTOIDS: No acute abnormality. SOFT TISSUES AND SKULL: No acute skull fracture. No acute soft tissue abnormality. CT CERVICAL SPINE BONES AND ALIGNMENT: No acute fracture or traumatic malalignment. Osteopenia. DEGENERATIVE CHANGES: Left C3-C4 and right C4-C5 facet arthropathy. C6-C7 degenerative  disc disease with endplate spurring and bilateral foraminal stenosis. SOFT TISSUES: No prevertebral soft tissue swelling. IMPRESSION: 1. No acute intracranial abnormality. 2. No acute fracture or traumatic malalignment of the cervical spine. Electronically signed by: Gilmore Molt 06/25/2024 12:28 AM EST RP Workstation: [id number]   DG Ankle Complete Right Result Date: 06/25/2024 EXAM: 3 OR MORE VIEW(S) XRAY OF THE RIGHT ANKLE 06/24/2024 11:39:35 PM CLINICAL HISTORY: Fall COMPARISON: None available. FINDINGS: BONES AND JOINTS: Oblique fracture through the distal fibular diaphysis with minimal displacement. Medial malleolar accessory ossicle versus remote nonunion fracture fragment. Fibular fracture plane extends to the level of the tibial plafond. Small superior and inferior calcaneal spurs. No malalignment. SOFT TISSUES: Mild diffuse soft tissue swelling. IMPRESSION: 1. Oblique fracture through the distal fibular diaphysis with minimal displacement, extending to the level of the  tibial plafond. 2. Mild diffuse soft tissue swelling. Electronically signed by: Dorethia Molt MD 06/25/2024 12:26 AM EST RP Workstation: [id number]    EKG: Independently reviewed.  Sinus bradycardia, no acute ST changes.  Assessment/Plan Principal Problem:   Syncope Active Problems:   AKI (acute kidney injury)  (please populate well all problems here in Problem List. (For example, if patient is on BP meds at home and you resume or decide to hold them, it is a problem that needs to be her. Same for CAD, COPD, HLD and so on)  Syncope - Likely vasovagal secondary to volume contraction from dehydration, patient did have significant prodromes before syncope but. - Currently patient appears to be euvolemic and blood pressure stable, will hold off further IVF. -Trauma scans CT head and neck negative. - Other Ddx, he is also borderline bradycardia, noticed at Cardizem  was discontinued by cardiology recently.  Will continue to monitor his heart rate on telemonitoring.  Low suspicion for structural heart problem, outpatient echo.  Right ankle closed fracture - Orthopedic surgery Dr. Tanda is consulted. - Continue immobilizer of the right ankle for now. - Hold off PT evaluation until clearance by orthopedic surgery.  AKI - Likely prerenal secondary to dehydration - Check renal ultrasound - Received IV bolus in the ED and maintenance IV fluid x 8 hours, hold off IV fluid for now as blood pressure started to run high recheck kidney function tomorrow  HTN - Blood pressure running high now, resume amlodipine   Questionable BPH - Check UA - Check PVR - Outpatient urology follow-up  PAF - Somewhat low suspicion for ventricle arrhythmia.  Continue telemonitoring x 24 hours. - Outpatient follow-up with cardiology for further event monitoring if indicated.  COPD - Stable, continue as needed bronchodilator  DVT prophylaxis: Heparin subcu Code Status: Full code Family Communication: None at  bedside Disposition Plan: Expect less than 2 midnight hospital stay Consults called: Orthopedic surgery Admission status: Telemetry observation   Cort ONEIDA Mana MD [facility] Pager [phone]  06/25/2024, 9:57 AM        [1]  Allergies Allergen Reactions   Penicillins Anaphylaxis and Swelling    Did it involve swelling of the face/tongue/throat, SOB, or low BP? Yes Did it involve sudden or severe rash/hives, skin peeling, or any reaction on the inside of your mouth or nose? No Did you need to seek medical attention at a hospital or doctor's office? No When did it last happen?      10 + years ago If all above answers are NO, may proceed with cephalosporin use.    Atorvastatin Other (See Comments)    myalgia   Trazodone  And Nefazodone  Caused fainting spells   "

## 2024-06-26 ENCOUNTER — Observation Stay
Admit: 2024-06-26 | Discharge: 2024-06-26 | Disposition: A | Discharge status: Home / Self Care | Attending: Internal Medicine | Admitting: Internal Medicine

## 2024-06-26 ENCOUNTER — Observation Stay

## 2024-06-26 DIAGNOSIS — I10 Essential (primary) hypertension: Secondary | ICD-10-CM | POA: Diagnosis not present

## 2024-06-26 DIAGNOSIS — J449 Chronic obstructive pulmonary disease, unspecified: Secondary | ICD-10-CM

## 2024-06-26 DIAGNOSIS — N179 Acute kidney failure, unspecified: Secondary | ICD-10-CM

## 2024-06-26 DIAGNOSIS — Z87891 Personal history of nicotine dependence: Secondary | ICD-10-CM

## 2024-06-26 DIAGNOSIS — F418 Other specified anxiety disorders: Secondary | ICD-10-CM

## 2024-06-26 DIAGNOSIS — R55 Syncope and collapse: Secondary | ICD-10-CM | POA: Diagnosis not present

## 2024-06-26 DIAGNOSIS — Z8679 Personal history of other diseases of the circulatory system: Secondary | ICD-10-CM | POA: Diagnosis not present

## 2024-06-26 DIAGNOSIS — S82891A Other fracture of right lower leg, initial encounter for closed fracture: Secondary | ICD-10-CM

## 2024-06-26 LAB — ECHOCARDIOGRAM COMPLETE
AR max vel: 1.48 cm2
AV Area VTI: 1.53 cm2
AV Area mean vel: 1.39 cm2
AV Mean grad: 8 mmHg
AV Peak grad: 16 mmHg
Ao pk vel: 2 m/s
Area-P 1/2: 3.08 cm2
Calc EF: 44.1 %
Height: 70 in
MV VTI: 1.67 cm2
S' Lateral: 2.3 cm
Single Plane A2C EF: 41 %
Single Plane A4C EF: 47.1 %
Weight: 3280.44 [oz_av]

## 2024-06-26 LAB — CBC
HCT: 40.2 % (ref 39.0–52.0)
Hemoglobin: 13.8 g/dL (ref 13.0–17.0)
MCH: 31.4 pg (ref 26.0–34.0)
MCHC: 34.3 g/dL (ref 30.0–36.0)
MCV: 91.4 fL (ref 80.0–100.0)
Platelets: 213 K/uL (ref 150–400)
RBC: 4.4 MIL/uL (ref 4.22–5.81)
RDW: 13 % (ref 11.5–15.5)
WBC: 8.5 K/uL (ref 4.0–10.5)
nRBC: 0 % (ref 0.0–0.2)

## 2024-06-26 LAB — BASIC METABOLIC PANEL WITH GFR
Anion gap: 9 (ref 5–15)
BUN: 19 mg/dL (ref 8–23)
CO2: 25 mmol/L (ref 22–32)
Calcium: 8.8 mg/dL — ABNORMAL LOW (ref 8.9–10.3)
Chloride: 105 mmol/L (ref 98–111)
Creatinine, Ser: 1.04 mg/dL (ref 0.61–1.24)
GFR, Estimated: >60 mL/min
Glucose, Bld: 97 mg/dL (ref 70–99)
Potassium: 3.9 mmol/L (ref 3.5–5.1)
Sodium: 138 mmol/L (ref 135–145)

## 2024-06-26 MED ORDER — NICOTINE 14 MG/24HR TD PT24
14.0000 mg | MEDICATED_PATCH | Freq: Every day | TRANSDERMAL | Status: DC
  Filled 2024-06-26 (×2): qty 1

## 2024-06-26 MED ORDER — LOSARTAN POTASSIUM 50 MG PO TABS
50.0000 mg | ORAL_TABLET | Freq: Every day | ORAL | Status: DC
  Administered 2024-06-26 – 2024-06-27 (×2): 50 mg via ORAL
  Filled 2024-06-26 (×2): qty 1

## 2024-06-26 NOTE — Assessment & Plan Note (Signed)
 History of COPD with no acute concern. -Continue with as needed bronchodilator

## 2024-06-26 NOTE — Assessment & Plan Note (Signed)
 Secondary to  fall. Podiatry is on board and recommending conservative management with splinting which was done. Patient will be basically nonweightbearing her right lower extremity and follow-up with them as outpatient. -PT/OT evaluation -Continue with pain management

## 2024-06-26 NOTE — Hospital Course (Addendum)
 Partly taken from H&P.  Jeffrey Torres is a 78 y.o. male with medical history significant of RUL lung adenocarcinoma stage Ib status post surgical resection, history of peritoneal cancer, HTN, COPD CAD, PAF not on anticoagulation presented with syncope which resulted in a fall and right ankle fracture.   Patient initially felt hot while working in his yard, sit down on bench for few minutes and while he was trying to stand up felt lightheadedness and fell on the grass.  Patient think that he was dehydrated for the past few days with decreased urine output and dark-colored urine.  Patient has history of postural hypotension and his medications were recently adjusted by cardiology.  Cardizem  was discontinued for bradycardia and patient was started on amlodipine .  On presentation borderline bradycardia, rest of the vital stable.  Labs with leukocytosis at 11.8, creatinine 1.9 with baseline around 1.1-1.2. EKG with sinus bradycardia. CT right ankle with oblique minimally commuted fracture of distal right fibular metadiaphysis extending to the level of tibial plafond with fracture fragments in the near anatomic alignment.  Associated extensive soft tissue swelling. Also noted developmental nonunion of medial malleolus. CT head and cervical spine was negative for any acute abnormality.  Patient was given some IV fluid.  Orthopedic surgery was consulted.  12/29: Vital stable with heart rate in high 50s most of the time, leukocytosis resolved, AKI resolved with creatinine now at 1.04.UA was negative for UTI Troponin 29>> 20.  Respiratory panel negative. Orthostatic vitals negative. Restarting home losartan  at a low dose of 50 mg daily.  Echocardiogram was normal. Carotid ultrasound pending. PT and OT evaluation pending.  Patient will be nonweightbearing on right lower extremity and conservative management for closed ankle fracture per podiatry.  12/30: Remained hemodynamically stable.  No significant  pain and patient does not want any opioids.  PT evaluated him and recommended home health services which was ordered.  Patient was instructed to keep himself well-hydrated.  He will continue with his home medications and follow-up with his providers for further assistance.

## 2024-06-26 NOTE — Plan of Care (Signed)

## 2024-06-26 NOTE — Assessment & Plan Note (Signed)
 Improved with IV fluid. -Monitor renal function -Avoid nephrotoxins

## 2024-06-26 NOTE — Progress Notes (Signed)
 " Progress Note   Patient: Jeffrey Torres FMW:[id number] DOB: Jan 02, 1946 DOA: 06/25/2024     0 DOS: the patient was seen and examined on 06/26/2024   Brief hospital course: Partly taken from H&P.  Jeffrey Torres is a 78 y.o. male with medical history significant of RUL lung adenocarcinoma stage Ib status post surgical resection, history of peritoneal cancer, HTN, COPD CAD, PAF not on anticoagulation presented with syncope which resulted in a fall and right ankle fracture.   Patient initially felt hot while working in his yard, sit down on bench for few minutes and while he was trying to stand up felt lightheadedness and fell on the grass.  Patient think that he was dehydrated for the past few days with decreased urine output and dark-colored urine.  Patient has history of postural hypotension and his medications were recently adjusted by cardiology.  Cardizem  was discontinued for bradycardia and patient was started on amlodipine .  On presentation borderline bradycardia, rest of the vital stable.  Labs with leukocytosis at 11.8, creatinine 1.9 with baseline around 1.1-1.2. EKG with sinus bradycardia. CT right ankle with oblique minimally commuted fracture of distal right fibular metadiaphysis extending to the level of tibial plafond with fracture fragments in the near anatomic alignment.  Associated extensive soft tissue swelling. Also noted developmental nonunion of medial malleolus. CT head and cervical spine was negative for any acute abnormality.  Patient was given some IV fluid.  Orthopedic surgery was consulted.  12/29: Vital stable with heart rate in high 50s most of the time, leukocytosis resolved, AKI resolved with creatinine now at 1.04.UA was negative for UTI Troponin 29>> 20.  Respiratory panel negative. Orthostatic vitals negative. Restarting home losartan  at a low dose of 50 mg daily.  Echocardiogram was normal. Carotid ultrasound pending. PT and OT evaluation pending.  Patient  will be nonweightbearing on right lower extremity and conservative management for closed ankle fracture per podiatry.  Assessment and Plan: * Syncope Likely vasovagal with some dehydration. Orthostatic vitals negative.  Echocardiogram normal.  Pending carotid ultrasound. - Encourage hydration  AKI (acute kidney injury) Improved with IV fluid. - Monitor renal function -Avoid nephrotoxins  Closed right ankle fracture Secondary to  fall. Podiatry is on board and recommending conservative management with splinting which was done. Patient will be basically nonweightbearing her right lower extremity and follow-up with them as outpatient. -PT/OT evaluation -Continue with pain management  Hypertension Blood pressure within goal. -Restarting home losartan  as AKI has been improved, dose decreased to 50 instead of 100-we can resume home dose if needed. - Continue home amlodipine   COPD (chronic obstructive pulmonary disease) (HCC) History of COPD with no acute concern. -Continue with as needed bronchodilator  History of CAD (coronary artery disease) No acute concern.  Barely positive troponin with a flat curve. - Continue with home aspirin  and statin  Depression with anxiety - Continue with home Zoloft   History of tobacco abuse Counseling was provided. -Nicotine  patch as needed   Subjective: Patient was seen and examined today.  No new concern.  Pain currently bearable.  Physical Exam: Vitals:   06/25/24 2029 06/26/24 0350 06/26/24 0735 06/26/24 1421  BP: (!) 141/61 (!) 152/71 134/67 137/70  Pulse: 60 (!) 52 (!) 59 (!) 53  Resp: 18 18 16 16   Temp:  98.3 F (36.8 C) 98.3 F (36.8 C) (!) 97.5 F (36.4 C)  TempSrc:   Oral Oral  SpO2: 97% 97% 98% 96%  Weight:      Height:  General.  Well-developed elderly man, in no acute distress. Pulmonary.  Lungs clear bilaterally, normal respiratory effort. CV.  Regular rate and rhythm, no JVD, rub or murmur. Abdomen.  Soft,  nontender, nondistended, BS positive. CNS.  Alert and oriented .  No focal neurologic deficit. Extremities.  Right lower extremity with splint and Ace wrap Psychiatry.  Judgment and insight appears normal.   Data Reviewed: Prior data reviewed  Family Communication: Cussed with son at bedside  Disposition: Status is: Observation The patient will require care spanning > 2 midnights and should be moved to inpatient because: Severity of illness  Planned Discharge Destination: To be determined  DVT prophylaxis.  Lovenox  Time spent: 50 minutes  This record has been created using Conservation officer, historic buildings. Errors have been sought and corrected,but may not always be located. Such creation errors do not reflect on the standard of care.   Author: Amaryllis Dare, MD 06/26/2024 5:06 PM  For on call review [email or web address].  "

## 2024-06-26 NOTE — Plan of Care (Signed)
   Problem: Activity: Goal: Risk for activity intolerance will decrease Outcome: Progressing   Problem: Nutrition: Goal: Adequate nutrition will be maintained Outcome: Progressing   Problem: Coping: Goal: Level of anxiety will decrease Outcome: Progressing   Problem: Elimination: Goal: Will not experience complications related to bowel motility Outcome: Progressing

## 2024-06-26 NOTE — Assessment & Plan Note (Signed)
-   Continue with home Zoloft

## 2024-06-26 NOTE — Assessment & Plan Note (Signed)
 No acute concern.  Barely positive troponin with a flat curve. - Continue with home aspirin  and statin

## 2024-06-26 NOTE — Assessment & Plan Note (Signed)
Counseling was provided. -Nicotine patch as needed 

## 2024-06-26 NOTE — Assessment & Plan Note (Signed)
 Blood pressure within goal. -Restarting home losartan  as AKI has been improved, dose decreased to 50 instead of 100-we can resume home dose if needed. - Continue home amlodipine 

## 2024-06-26 NOTE — Care Management Obs Status (Signed)
 MEDICARE OBSERVATION STATUS NOTIFICATION   Patient Details  Name: NASZIR COTT MRN: [id number] Date of Birth: 01-Feb-1946   Medicare Observation Status Notification Given:  Chaney BRANDY CHRISTIANE LELON, CMA 06/26/2024, 1:24 PM

## 2024-06-26 NOTE — Assessment & Plan Note (Signed)
 Likely vasovagal with some dehydration. Orthostatic vitals negative.  Echocardiogram normal.  Pending carotid ultrasound. - Encourage hydration

## 2024-06-27 DIAGNOSIS — S82891A Other fracture of right lower leg, initial encounter for closed fracture: Secondary | ICD-10-CM | POA: Diagnosis not present

## 2024-06-27 DIAGNOSIS — I10 Essential (primary) hypertension: Secondary | ICD-10-CM | POA: Diagnosis not present

## 2024-06-27 DIAGNOSIS — R55 Syncope and collapse: Secondary | ICD-10-CM | POA: Diagnosis not present

## 2024-06-27 DIAGNOSIS — N179 Acute kidney failure, unspecified: Secondary | ICD-10-CM | POA: Diagnosis not present

## 2024-06-27 NOTE — Evaluation (Signed)
 Occupational Therapy Evaluation Patient Details Name: Jeffrey Torres MRN: [id number] DOB: Jun 27, 1946 Today's Date: 06/27/2024   History of Present Illness   Pt is a 78 y.o. male presented with syncope which resulted in a fall and right ankle fracture-splinted and to be NWB managed conservatively. PMH of RUL lung adenocarcinoma stage Ib status post surgical resection, history of peritoneal cancer, HTN, COPD CAD, PAF not on anticoagulation     Clinical Impressions Pt was seen for OT evaluation this date. PTA, pt resides in a one level home with 1 STE. Reports his grandson and grandson's girlfriend live with him and are available 24/7. He is independent with all ADLs and mobility at baseline without AD use, drives, community mobile. Pt presents with deficits in strength, activity tolerance and balance limiting their ability to perform ADL management at baseline level. Pt currently requires MOD I for bed mobility and supervision for squat pivot transfer from bed to recliner without AD use using safe technique and good adherence to NWB precautions on RLE. Extensive education on DME/AE/AD needs and compensatory strategies for ADL performance to maximize safety and independence while maintaining NWB precautions with pt verbalizing understanding. Recommend BSC and transport wheelchair for safe DC home. Will follow acutely to promote return to PLOF.       If plan is discharge home, recommend the following:   A little help with walking and/or transfers;A little help with bathing/dressing/bathroom;Help with stairs or ramp for entrance     Functional Status Assessment   Patient has had a recent decline in their functional status and demonstrates the ability to make significant improvements in function in a reasonable and predictable amount of time.     Equipment Recommendations   BSC/3in1;Wheelchair (measurements OT)     Recommendations for Other Services          Precautions/Restrictions   Precautions Precautions: Fall Recall of Precautions/Restrictions: Impaired Restrictions Weight Bearing Restrictions Per Provider Order: Yes RLE Weight Bearing Per Provider Order: Non weight bearing     Mobility Bed Mobility Overal bed mobility: Modified Independent                  Transfers Overall transfer level: Needs assistance   Transfers: Bed to chair/wheelchair/BSC     Squat pivot transfers: Supervision       General transfer comment: good safety during transfers      Balance Overall balance assessment: Needs assistance Sitting-balance support: No upper extremity supported, Feet unsupported Sitting balance-Leahy Scale: Good     Standing balance support: Bilateral upper extremity supported, During functional activity, Reliant on assistive device for balance Standing balance-Leahy Scale: Fair Standing balance comment: RW for UE support in standing                           ADL either performed or assessed with clinical judgement   ADL Overall ADL's : Needs assistance/impaired Eating/Feeding: Independent;Sitting;Set up       Upper Body Bathing: Modified independent;Set up;Sitting Upper Body Bathing Details (indicate cue type and reason): anticipate         Lower Body Dressing: Contact guard assist;Sitting/lateral leans;Sit to/from stand Lower Body Dressing Details (indicate cue type and reason): anticipate Toilet Transfer: Supervision/safety;Squat-pivot Toilet Transfer Details (indicate cue type and reason): simulated bed to recliner                 Vision         Perception  Praxis         Pertinent Vitals/Pain Pain Assessment Pain Assessment: No/denies pain     Extremity/Trunk Assessment Upper Extremity Assessment Upper Extremity Assessment: Overall WFL for tasks assessed   Lower Extremity Assessment Lower Extremity Assessment: Overall WFL for tasks assessed;RLE  deficits/detail;Defer to PT evaluation RLE Deficits / Details: Unable to full assess due to weight bearing restrictions       Communication Communication Communication: No apparent difficulties   Cognition Arousal: Alert Behavior During Therapy: WFL for tasks assessed/performed Cognition: No apparent impairments                                       Cueing  General Comments          Exercises Other Exercises Other Exercises: Edu on role of OT in acute setting, DC recommendations and appropriate DME for safe return home.   Shoulder Instructions      Home Living Family/patient expects to be discharged to:: Private residence Living Arrangements: Other relatives (grandson and grandsons girlfriend) Available Help at Discharge: Family;Available 24 hours/day Type of Home: House Home Access: Stairs to enter [employer] of Steps: 1 STE, can hold door facing   Home Layout: One level     Bathroom Shower/Tub: Chief Strategy Officer: Handicapped height     Home Equipment: Agricultural Consultant (2 wheels)          Prior Functioning/Environment Prior Level of Function : Independent/Modified Independent;Driving             Mobility Comments: only drives during the day, retired; does not drive far; no AD use; one fall leading to this hospitalization ADLs Comments: IND with ADL/IADLs    OT Problem List: Decreased strength;Impaired balance (sitting and/or standing)   OT Treatment/Interventions: Self-care/ADL training;Therapeutic exercise;Therapeutic activities;Patient/family education;DME and/or AE instruction;Balance training      OT Goals(Current goals can be found in the care plan section)   Acute Rehab OT Goals Patient Stated Goal: go home OT Goal Formulation: With patient Time For Goal Achievement: 07/11/24 Potential to Achieve Goals: Good ADL Goals Pt Will Perform Lower Body Dressing: with modified independence;sitting/lateral  leans;sit to/from stand Pt Will Transfer to Toilet: with modified independence;stand pivot transfer;ambulating Additional ADL Goal #1: Pt will demo implementation of 1 learned falls prevention strategies and maintainence of NWB precautions to RLE during transfer and ADL performance to ensure healing.   OT Frequency:  Min 2X/week    Co-evaluation              AM-PAC OT 6 Clicks Daily Activity     Outcome Measure Help from another person eating meals?: A Little Help from another person taking care of personal grooming?: A Little Help from another person toileting, which includes using toliet, bedpan, or urinal?: A Little Help from another person bathing (including washing, rinsing, drying)?: A Little Help from another person to put on and taking off regular upper body clothing?: A Little Help from another person to put on and taking off regular lower body clothing?: A Little 6 Click Score: 18   End of Session Nurse Communication: Mobility status  Activity Tolerance: Patient tolerated treatment well Patient left: in chair;with call bell/phone within reach;with chair alarm set  OT Visit Diagnosis: Other abnormalities of gait and mobility (R26.89);Muscle weakness (generalized) (M62.81)                Time: [id number]  OT Time Calculation (min): 26 min Charges:  OT General Charges $OT Visit: 1 Visit OT Evaluation $OT Eval Moderate Complexity: 1 Mod OT Treatments $Self Care/Home Management : 8-22 mins  Dillie Burandt Chrismon, OTR/L  06/27/2024, 1:22 PM  Henning Ehle E Chrismon 06/27/2024, 1:17 PM

## 2024-06-27 NOTE — Plan of Care (Signed)
   Problem: Activity: Goal: Risk for activity intolerance will decrease Outcome: Progressing   Problem: Nutrition: Goal: Adequate nutrition will be maintained Outcome: Progressing   Problem: Coping: Goal: Level of anxiety will decrease Outcome: Progressing

## 2024-06-27 NOTE — Discharge Summary (Signed)
 " Physician Discharge Summary   Patient: Jeffrey Torres MRN: [id number] DOB: 1945/08/07  Admit date:     06/25/2024  Discharge date: 06/27/2024  Discharge Physician: Amaryllis Dare   PCP: [name], MD   Recommendations at discharge:  Please obtain CBC and BMP on follow-up Follow-up with podiatry for ankle fracture Follow-up with primary care provider  Discharge Diagnoses: Principal Problem:   Syncope Active Problems:   AKI (acute kidney injury)   Closed right ankle fracture   Hypertension   COPD (chronic obstructive pulmonary disease) (HCC)   History of CAD (coronary artery disease)   Depression with anxiety   History of tobacco abuse   Hospital Course: Partly taken from H&P.  FORTUNE BRANNIGAN is a 78 y.o. male with medical history significant of RUL lung adenocarcinoma stage Ib status post surgical resection, history of peritoneal cancer, HTN, COPD CAD, PAF not on anticoagulation presented with syncope which resulted in a fall and right ankle fracture.   Patient initially felt hot while working in his yard, sit down on bench for few minutes and while he was trying to stand up felt lightheadedness and fell on the grass.  Patient think that he was dehydrated for the past few days with decreased urine output and dark-colored urine.  Patient has history of postural hypotension and his medications were recently adjusted by cardiology.  Cardizem  was discontinued for bradycardia and patient was started on amlodipine .  On presentation borderline bradycardia, rest of the vital stable.  Labs with leukocytosis at 11.8, creatinine 1.9 with baseline around 1.1-1.2. EKG with sinus bradycardia. CT right ankle with oblique minimally commuted fracture of distal right fibular metadiaphysis extending to the level of tibial plafond with fracture fragments in the near anatomic alignment.  Associated extensive soft tissue swelling. Also noted developmental nonunion of medial malleolus. CT head and  cervical spine was negative for any acute abnormality.  Patient was given some IV fluid.  Orthopedic surgery was consulted.  12/29: Vital stable with heart rate in high 50s most of the time, leukocytosis resolved, AKI resolved with creatinine now at 1.04.UA was negative for UTI Troponin 29>> 20.  Respiratory panel negative. Orthostatic vitals negative. Restarting home losartan  at a low dose of 50 mg daily.  Echocardiogram was normal. Carotid ultrasound pending. PT and OT evaluation pending.  Patient will be nonweightbearing on right lower extremity and conservative management for closed ankle fracture per podiatry.  12/30: Remained hemodynamically stable.  No significant pain and patient does not want any opioids.  PT evaluated him and recommended home health services which was ordered.  Patient was instructed to keep himself well-hydrated.  He will continue with his home medications and follow-up with his providers for further assistance.  Assessment and Plan: * Syncope Likely vasovagal with some dehydration. Orthostatic vitals negative.  Echocardiogram normal.  Pending carotid ultrasound. - Encourage hydration  AKI (acute kidney injury) Improved with IV fluid. - Monitor renal function -Avoid nephrotoxins  Closed right ankle fracture Secondary to  fall. Podiatry is on board and recommending conservative management with splinting which was done. Patient will be basically nonweightbearing her right lower extremity and follow-up with them as outpatient. -PT/OT evaluation-recommending home health which was ordered  Hypertension Blood pressure now within goal. - Continue home medications  COPD (chronic obstructive pulmonary disease) (HCC) History of COPD with no acute concern. -Continue with as needed bronchodilator  History of CAD (coronary artery disease) No acute concern.  Barely positive troponin with a flat curve. -  Continue with home aspirin  and statin  Depression with  anxiety - Continue with home Zoloft   History of tobacco abuse Counseling was provided. -Nicotine  patch as needed  Consultants: Podiatry Procedures performed: None Disposition: Home health Diet recommendation:  Cardiac diet DISCHARGE MEDICATION: Allergies as of 06/27/2024       Reactions   Penicillins Anaphylaxis, Swelling   Did it involve swelling of the face/tongue/throat, SOB, or low BP? Yes Did it involve sudden or severe rash/hives, skin peeling, or any reaction on the inside of your mouth or nose? No Did you need to seek medical attention at a hospital or doctor's office? No When did it last happen?      10 + years ago If all above answers are NO, may proceed with cephalosporin use.   Atorvastatin Other (See Comments)   myalgia   Trazodone  And Nefazodone    Caused fainting spells        Medication List     TAKE these medications    albuterol  108 (90 Base) MCG/ACT inhaler Commonly known as: VENTOLIN  HFA Inhale 2 puffs into the lungs every 6 (six) hours as needed.   amLODipine  5 MG tablet Commonly known as: NORVASC  Take 1 tablet (5 mg total) by mouth daily.   aspirin  81 MG tablet Take 81 mg by mouth daily.   ezetimibe  10 MG tablet Commonly known as: ZETIA  Take 1 tablet (10 mg total) by mouth daily.   losartan  100 MG tablet Commonly known as: COZAAR  TAKE 1 TABLET BY MOUTH EVERY DAY   sertraline  100 MG tablet Commonly known as: ZOLOFT  TAKE 1 TABLET BY MOUTH EVERY DAY   Trelegy Ellipta  100-62.5-25 MCG/ACT Aepb Generic drug: Fluticasone -Umeclidin-Vilant Inhale 1 puff into the lungs daily. What changed:  when to take this reasons to take this               Durable Medical Equipment  (From admission, onward)           Start     Ordered   06/27/24 0914  For home use only DME lightweight manual wheelchair with seat cushion  Once       Comments: Patient suffers from ankle fracture which impairs their ability to perform daily activities  like bathing, dressing, feeding, grooming, and toileting in the home.  A walker will not resolve  issue with performing activities of daily living. A wheelchair will allow patient to safely perform daily activities. Patient is not able to propel themselves in the home using a standard weight wheelchair due to general weakness. Patient can self propel in the lightweight wheelchair. Length of need 6 months . Accessories: elevating leg rests (ELRs), wheel locks, extensions and anti-tippers.   06/27/24 0913   06/27/24 0913  For home use only DME Bedside commode  Once       Question:  Patient needs a bedside commode to treat with the following condition  Answer:  Ankle fracture   06/27/24 9087            Contact information for follow-up providers     Tanda Greig MATSU, DPM. Schedule an appointment as soon as possible for a visit in 1 week(s).   Specialty: Podiatry Contact information: [address] [place] [address] [phone]              Contact information for after-discharge care     Home Medical Care     [facility]) .   Service: Home Health Services  Contact information: [address] [place]  [address] [phone]                    Discharge Exam: Fredricka Weights   06/24/24 2259 06/25/24 0506  Weight: 93 kg 93 kg   General.  Well-developed elderly man, in no acute distress. Pulmonary.  Lungs clear bilaterally, normal respiratory effort. CV.  Regular rate and rhythm, no JVD, rub or murmur. Abdomen.  Soft, nontender, nondistended, BS positive. CNS.  Alert and oriented .  No focal neurologic deficit. Extremities.  No edema, RLE with splint Psychiatry.  Judgment and insight appears normal.   Condition at discharge: stable  The results of significant diagnostics from this hospitalization (including imaging, microbiology, ancillary and laboratory) are listed below for reference.    Imaging Studies: ECHOCARDIOGRAM COMPLETE Result Date: 06/26/2024    ECHOCARDIOGRAM REPORT   Patient Name:   [place] NICHOLS [facility] Date of Exam: 06/26/2024 Medical Rec #:  [id number]      Height:       70.0 in Accession #:    [id number]     Weight:       205.0 lb Date of Birth:  1945/11/22      BSA:          2.109 m Patient Age:    78 years       BP:           134/67 mmHg Patient Gender: M              HR:           59 bpm. Exam Location:  [facility] Procedure: 2D Echo, Color Doppler and Cardiac Doppler (Both Spectral and Color            Flow Doppler were utilized during procedure). Indications:     Syncope R55  History:         Patient has prior history of Echocardiogram examinations, most                  recent 03/15/2022. Signs/Symptoms:Syncope.  Sonographer:     Ashley McNeely-Sloane Referring Phys:  [id number] Gabryela Kimbrell Diagnosing Phys: Keller Alluri IMPRESSIONS  1. Left ventricular ejection fraction, by estimation, is 60 to 65%. The left ventricle has normal function. The left ventricle has no regional wall motion abnormalities. There is mild left ventricular hypertrophy. Left ventricular diastolic parameters were normal.  2. Right ventricular systolic function is normal. The right ventricular size is normal.  3. The mitral valve is normal in structure. No evidence of mitral valve regurgitation.  4. The aortic valve is tricuspid. Aortic valve regurgitation is not visualized. Aortic valve sclerosis/calcification is present, without any evidence of aortic stenosis.  5. The inferior vena cava is normal in size with greater than 50% respiratory variability, suggesting right atrial pressure of 3 mmHg. FINDINGS  Left Ventricle: Left ventricular ejection fraction, by estimation, is 60 to 65%. The left ventricle has normal function. The left ventricle has no regional wall motion abnormalities. The left ventricular internal cavity size was normal in size. There is  mild left ventricular hypertrophy. Left ventricular  diastolic parameters were normal. Right Ventricle: The right ventricular size is normal. No increase in right ventricular wall thickness. Right ventricular systolic function is normal. Left Atrium: Left atrial size was normal in size. Right Atrium: Right atrial size was normal in size. Pericardium: There is no evidence of pericardial effusion. Mitral Valve: The mitral valve is normal in structure.  No evidence of mitral valve regurgitation. MV peak gradient, 4.5 mmHg. The mean mitral valve gradient is 1.0 mmHg. Tricuspid Valve: The tricuspid valve is normal in structure. Tricuspid valve regurgitation is trivial. Aortic Valve: The aortic valve is tricuspid. Aortic valve regurgitation is not visualized. Aortic valve sclerosis/calcification is present, without any evidence of aortic stenosis. Aortic valve mean gradient measures 8.0 mmHg. Aortic valve peak gradient measures 16.0 mmHg. Aortic valve area, by VTI measures 1.53 cm. Pulmonic Valve: The pulmonic valve was not well visualized. Pulmonic valve regurgitation is not visualized. Aorta: The aortic root and ascending aorta are structurally normal, with no evidence of dilitation. Venous: The inferior vena cava is normal in size with greater than 50% respiratory variability, suggesting right atrial pressure of 3 mmHg. IAS/Shunts: The atrial septum is grossly normal.  LEFT VENTRICLE PLAX 2D LVIDd:         4.00 cm      Diastology LVIDs:         2.30 cm      LV e' medial:    7.18 cm/s LV PW:         1.50 cm      LV E/e' medial:  9.2 LV IVS:        1.10 cm      LV e' lateral:   13.20 cm/s LVOT diam:     1.80 cm      LV E/e' lateral: 5.0 LV SV:         64 LV SV Index:   30 LVOT Area:     2.54 cm  LV Volumes (MOD) LV vol d, MOD A2C: 66.8 ml LV vol d, MOD A4C: 113.0 ml LV vol s, MOD A2C: 39.4 ml LV vol s, MOD A4C: 59.8 ml LV SV MOD A2C:     27.4 ml LV SV MOD A4C:     113.0 ml LV SV MOD BP:      38.3 ml RIGHT VENTRICLE            IVC RV Basal diam:  4.70 cm    IVC diam: 1.40  cm RV Mid diam:    4.20 cm RV S prime:     9.14 cm/s TAPSE (M-mode): 1.7 cm LEFT ATRIUM             Index        RIGHT ATRIUM           Index LA diam:        3.00 cm 1.42 cm/m   RA Area:     11.50 cm LA Vol (A2C):   45.1 ml 21.38 ml/m  RA Volume:   21.80 ml  10.33 ml/m LA Vol (A4C):   40.7 ml 19.29 ml/m LA Biplane Vol: 42.9 ml 20.34 ml/m  AORTIC VALVE                     PULMONIC VALVE AV Area (Vmax):    1.48 cm      PV Vmax:        0.95 m/s AV Area (Vmean):   1.39 cm      PV Vmean:       65.400 cm/s AV Area (VTI):     1.53 cm      PV VTI:         0.209 m AV Vmax:           200.00 cm/s   PV Peak grad:   3.6 mmHg AV Vmean:  133.000 cm/s  PV Mean grad:   2.0 mmHg AV VTI:            0.416 m       RVOT Peak grad: 2 mmHg AV Peak Grad:      16.0 mmHg AV Mean Grad:      8.0 mmHg LVOT Vmax:         116.00 cm/s LVOT Vmean:        72.700 cm/s LVOT VTI:          0.250 m LVOT/AV VTI ratio: 0.60  AORTA Ao Root diam: 3.40 cm Ao Asc diam:  2.50 cm MITRAL VALVE MV Area (PHT): 3.08 cm    SHUNTS MV Area VTI:   1.67 cm    Systemic VTI:  0.25 m MV Peak grad:  4.5 mmHg    Systemic Diam: 1.80 cm MV Mean grad:  1.0 mmHg    Pulmonic VTI:  0.166 m MV Vmax:       1.06 m/s MV Vmean:      46.2 cm/s MV Decel Time: 246 msec MV E velocity: 65.70 cm/s MV A velocity: 96.60 cm/s MV E/A ratio:  0.68 Keller Paterson Electronically signed by Keller Paterson Signature Date/Time: 06/26/2024/1:42:33 PM    Final    US  RENAL Result Date: 06/25/2024 CLINICAL DATA:  Acute kidney injury. EXAM: RENAL / URINARY TRACT ULTRASOUND COMPLETE COMPARISON:  None Available. FINDINGS: Right Kidney: Renal measurements: 10.9 x 5.1 x 5.5 cm = volume: 160 mL. No hydronephrosis. Normal parenchymal echogenicity. Subcentimeter cyst in the medial kidney. No further follow-up imaging is recommended. Possible shadowing stone in the central kidney measuring 12 mm. Left Kidney: Renal measurements: 11 x 5.8 x 5.7 cm = volume: 188 mL. Normal parenchymal  echogenicity. No hydronephrosis. No visualized stone or focal lesion. Bladder: Appears normal for degree of bladder distention. Other: None. IMPRESSION: 1. No obstructive uropathy. 2. Possible right renal calculus. Electronically Signed   By: Andrea Gasman M.D.   On: 06/25/2024 17:14   CT Ankle Right Wo Contrast Result Date: 06/25/2024 EXAM: CT RIGHT ANKLE, WITHOUT IV CONTRAST 06/25/2024 03:24:55 AM TECHNIQUE: Axial images were acquired through the right ankle without IV contrast. Reformatted images were reviewed. Automated exposure control, iterative reconstruction, and/or weight based adjustment of the mA/kV was utilized to reduce the radiation dose to as low as reasonably achievable. COMPARISON: None provided. CLINICAL HISTORY: Right ankle fracture, eval morphology. FINDINGS: BONES: Oblique minimal comminuted fracture of the distal right fibular metadiaphysis noted extending to the level of the tibial plafond immediately with fracture fragments in near anatomic alignment. Developmental nonunion of the medial malleolus. Distal tibia and hindfoot are otherwise intact. Small superior plantar calcaneal spurs are present. JOINTS: No dislocation. No ankle effusion. The joint spaces are normal. SOFT TISSUES: Extensive soft tissue swelling superficial to the lateral malleolus noted. No subcutaneous loculated fluid collection identified. Visualized extensor, flexor, and peroneal tendons are intact. Achilles tendon is intact. IMPRESSION: 1. Oblique minimal comminuted fracture of the distal right fibular metadiaphysis extending to the level of the tibial plafond with fracture fragments in near anatomic alignment. 2. Extensive soft tissue swelling superficial to the lateral malleolus, without subcutaneous loculated fluid collection. 3. Developmental nonunion of the medial malleolus. Electronically signed by: Dorethia Molt MD 06/25/2024 04:09 AM EST RP Workstation: [id number]   DG Chest Port 1 View Result Date:  06/25/2024 EXAM: 1 VIEW(S) XRAY OF THE CHEST 06/24/2024 11:39:35 PM COMPARISON: CT 05/18/2024. CLINICAL HISTORY: Chest pain FINDINGS: LUNGS AND PLEURA: Small left  pleural effusion. Postsurgical changes of right lung partial resection with associated volume loss are noted. No pneumothorax. HEART AND MEDIASTINUM: Atherosclerotic calcifications are present. No acute abnormality of the cardiac and mediastinal silhouettes. BONES AND SOFT TISSUES: Nodular opacity at the left first costochondral junction is in keeping with hypertrophic spurring as seen on prior CT examination. No acute fracture. IMPRESSION: 1. Small left pleural effusion. 2. Postsurgical changes of right lung partial resection with associated volume loss. 3. Nodular opacity at the left first costochondral junction consistent with chronic hypertrophic spurring, stable from prior CT. 4. Atherosclerotic calcifications. Electronically signed by: Dorethia Molt MD 06/25/2024 12:28 AM EST RP Workstation: [id number]   CT Head Wo Contrast Result Date: 06/25/2024 EXAM: CT HEAD AND CERVICAL SPINE 06/24/2024 11:21:34 PM TECHNIQUE: CT of the head and cervical spine was performed without the administration of intravenous contrast. Multiplanar reformatted images are provided for review. Automated exposure control, iterative reconstruction, and/or weight based adjustment of the mA/kV was utilized to reduce the radiation dose to as low as reasonably achievable. COMPARISON: None available. CLINICAL HISTORY: fall fall FINDINGS: CT HEAD BRAIN AND VENTRICLES: No acute intracranial hemorrhage. No mass effect or midline shift. No abnormal extra-axial fluid collection. No evidence of acute infarct. No hydrocephalus. ORBITS: No acute abnormality. SINUSES AND MASTOIDS: No acute abnormality. SOFT TISSUES AND SKULL: No acute skull fracture. No acute soft tissue abnormality. CT CERVICAL SPINE BONES AND ALIGNMENT: No acute fracture or traumatic malalignment. Osteopenia.  DEGENERATIVE CHANGES: Left C3-C4 and right C4-C5 facet arthropathy. C6-C7 degenerative disc disease with endplate spurring and bilateral foraminal stenosis. SOFT TISSUES: No prevertebral soft tissue swelling. IMPRESSION: 1. No acute intracranial abnormality. 2. No acute fracture or traumatic malalignment of the cervical spine. Electronically signed by: Gilmore Molt 06/25/2024 12:28 AM EST RP Workstation: [id number]   CT Cervical Spine Wo Contrast Result Date: 06/25/2024 EXAM: CT HEAD AND CERVICAL SPINE 06/24/2024 11:21:34 PM TECHNIQUE: CT of the head and cervical spine was performed without the administration of intravenous contrast. Multiplanar reformatted images are provided for review. Automated exposure control, iterative reconstruction, and/or weight based adjustment of the mA/kV was utilized to reduce the radiation dose to as low as reasonably achievable. COMPARISON: None available. CLINICAL HISTORY: fall fall FINDINGS: CT HEAD BRAIN AND VENTRICLES: No acute intracranial hemorrhage. No mass effect or midline shift. No abnormal extra-axial fluid collection. No evidence of acute infarct. No hydrocephalus. ORBITS: No acute abnormality. SINUSES AND MASTOIDS: No acute abnormality. SOFT TISSUES AND SKULL: No acute skull fracture. No acute soft tissue abnormality. CT CERVICAL SPINE BONES AND ALIGNMENT: No acute fracture or traumatic malalignment. Osteopenia. DEGENERATIVE CHANGES: Left C3-C4 and right C4-C5 facet arthropathy. C6-C7 degenerative disc disease with endplate spurring and bilateral foraminal stenosis. SOFT TISSUES: No prevertebral soft tissue swelling. IMPRESSION: 1. No acute intracranial abnormality. 2. No acute fracture or traumatic malalignment of the cervical spine. Electronically signed by: Gilmore Molt 06/25/2024 12:28 AM EST RP Workstation: [id number]   DG Ankle Complete Right Result Date: 06/25/2024 EXAM: 3 OR MORE VIEW(S) XRAY OF THE RIGHT ANKLE 06/24/2024 11:39:35 PM CLINICAL  HISTORY: Fall COMPARISON: None available. FINDINGS: BONES AND JOINTS: Oblique fracture through the distal fibular diaphysis with minimal displacement. Medial malleolar accessory ossicle versus remote nonunion fracture fragment. Fibular fracture plane extends to the level of the tibial plafond. Small superior and inferior calcaneal spurs. No malalignment. SOFT TISSUES: Mild diffuse soft tissue swelling. IMPRESSION: 1. Oblique fracture through the distal fibular diaphysis with minimal displacement, extending to the level of the tibial plafond. 2. Mild  diffuse soft tissue swelling. Electronically signed by: Dorethia Molt MD 06/25/2024 12:26 AM EST RP Workstation: [id number]    Microbiology: Results for orders placed or performed during the hospital encounter of 06/25/24  Respiratory (~20 pathogens) panel by PCR     Status: None   Collection Time: 06/25/24  9:57 AM   Specimen: Nasopharyngeal Swab; Respiratory  Result Value Ref Range Status   Adenovirus NOT DETECTED NOT DETECTED Final   Coronavirus 229E NOT DETECTED NOT DETECTED Final    Comment: (NOTE) The Coronavirus on the Respiratory Panel, DOES NOT test for the novel  Coronavirus (2019 nCoV)    Coronavirus HKU1 NOT DETECTED NOT DETECTED Final   Coronavirus NL63 NOT DETECTED NOT DETECTED Final   Coronavirus OC43 NOT DETECTED NOT DETECTED Final   Metapneumovirus NOT DETECTED NOT DETECTED Final   Rhinovirus / Enterovirus NOT DETECTED NOT DETECTED Final   Influenza A NOT DETECTED NOT DETECTED Final   Influenza B NOT DETECTED NOT DETECTED Final   Parainfluenza Virus 1 NOT DETECTED NOT DETECTED Final   Parainfluenza Virus 2 NOT DETECTED NOT DETECTED Final   Parainfluenza Virus 3 NOT DETECTED NOT DETECTED Final   Parainfluenza Virus 4 NOT DETECTED NOT DETECTED Final   Respiratory Syncytial Virus NOT DETECTED NOT DETECTED Final   Bordetella pertussis NOT DETECTED NOT DETECTED Final   Bordetella Parapertussis NOT DETECTED NOT DETECTED Final    Chlamydophila pneumoniae NOT DETECTED NOT DETECTED Final   Mycoplasma pneumoniae NOT DETECTED NOT DETECTED Final    Comment: Performed at [facility], [address], [place], [place] [address]    Labs: CBC: Recent Labs  Lab 06/24/24 2309 06/26/24 0824  WBC 11.8* 8.5  HGB 14.9 13.8  HCT 43.2 40.2  MCV 91.3 91.4  PLT 246 213   Basic Metabolic Panel: Recent Labs  Lab 06/24/24 2309 06/26/24 0824  NA 140 138  K 3.7 3.9  CL 103 105  CO2 22 25  GLUCOSE 113* 97  BUN 36* 19  CREATININE 1.95* 1.04  CALCIUM 9.2 8.8*   Liver Function Tests: No results for input(s): AST, ALT, ALKPHOS, BILITOT, PROT, ALBUMIN  in the last 168 hours. CBG: No results for input(s): GLUCAP in the last 168 hours.  Discharge time spent: greater than 30 minutes.  This record has been created using Conservation officer, historic buildings. Errors have been sought and corrected,but may not always be located. Such creation errors do not reflect on the standard of care.   Signed: Amaryllis Dare, MD [facility] 06/27/2024 "

## 2024-06-27 NOTE — Evaluation (Signed)
 Physical Therapy Evaluation Patient Details Name: Jeffrey Torres MRN: [id number] DOB: [birth year] Today's Date: 06/27/2024  History of Present Illness  Pt is a 78 y.o. male presented with syncope which resulted in a fall and right ankle fracture-splinted and to be NWB managed conservatively. PMH of RUL lung adenocarcinoma stage Ib status post surgical resection, history of peritoneal cancer, HTN, COPD CAD, PAF not on anticoagulation   Clinical Impression  Pt received seated in recliner upon arrival to room and pt agreeable to therapy.  Pt is able to transfer smoothly with the use of the walker and does a good job with abiding to the NWB precautions that are set in place for the R foot.  Pt given verbal and visual demonstration of how to navigate the one step at home, and pt was able to replicate it well.  Pt demonstrates adequate upper body strength to perform hop onto the steps.  Pt also with grandson support at home, along with son for transfer back to home.  Pt demonstrates adequate strength necessary for transfers at home and has the assistance necessary as well.        If plan is discharge home, recommend the following: A little help with walking and/or transfers;A little help with bathing/dressing/bathroom;Assistance with cooking/housework;Assist for transportation;Help with stairs or ramp for entrance   Can travel by private vehicle        Equipment Recommendations BSC/3in1;Wheelchair (measurements PT) (Wheelchair can be transport chair for more narrow mobility within the home.)  Recommendations for Other Services       Functional Status Assessment Patient has had a recent decline in their functional status and demonstrates the ability to make significant improvements in function in a reasonable and predictable amount of time.     Precautions / Restrictions Restrictions Weight Bearing Restrictions Per Provider Order: Yes RLE Weight Bearing Per Provider Order: Non weight bearing       Mobility  Bed Mobility               General bed mobility comments: pt urpight in recliner upon arrival.    Transfers Overall transfer level: Needs assistance Equipment used: Rolling walker (2 wheels) Transfers: Sit to/from Stand Sit to Stand: Supervision, Contact guard assist           General transfer comment: Pt able to utilize the walker and the arm rests to come upright into standing position    Ambulation/Gait Ambulation/Gait assistance: Contact guard assist Gait Distance (Feet): 10 Feet Assistive device: Rolling walker (2 wheels)   Gait velocity: decreased     General Gait Details: Hop-to gait pattern with the use of the walker, with pivots and is safe in doing so.  Stairs            Wheelchair Mobility     Tilt Bed    Modified Rankin (Stroke Patients Only)       Balance Overall balance assessment: Needs assistance Sitting-balance support: No upper extremity supported, Feet unsupported Sitting balance-Leahy Scale: Good     Standing balance support: Bilateral upper extremity supported, During functional activity, Reliant on assistive device for balance Standing balance-Leahy Scale: Poor Standing balance comment: Pt needing walker and UE support to remain upright due to weightbearing precautions on the R LE.                             Pertinent Vitals/Pain Pain Assessment Pain Assessment: No/denies pain    Home Living Family/patient  expects to be discharged to:: Private residence Living Arrangements: Other relatives (grandson and grandsons girlfriend) Available Help at Discharge: Family;Available 24 hours/day Type of Home: House Home Access: Stairs to enter   [employer] of Steps: 1 STE, can hold door facing   Home Layout: One level Home Equipment: [employer] (2 wheels)      Prior Function Prior Level of Function : Independent/Modified Independent;Driving             Mobility Comments:  only drives during the day, retired; does not drive far; no AD use; one fall leading to this hospitalization ADLs Comments: IND with ADL/IADLs     Extremity/Trunk Assessment   Upper Extremity Assessment Upper Extremity Assessment: Overall WFL for tasks assessed    Lower Extremity Assessment Lower Extremity Assessment: Overall WFL for tasks assessed;RLE deficits/detail RLE Deficits / Details: Unable to full assess due to weight bearing restrictions       Communication   Communication Communication: No apparent difficulties    Cognition Arousal: Alert Behavior During Therapy: WFL for tasks assessed/performed                                     Cueing Cueing Techniques: Verbal cues     General Comments      Exercises     Assessment/Plan    PT Assessment Patient needs continued PT services  PT Problem List Decreased strength;Decreased activity tolerance;Decreased balance;Decreased mobility;Decreased knowledge of use of DME;Decreased safety awareness       PT Treatment Interventions DME instruction;Gait training;Stair training;Functional mobility training;Therapeutic activities;Therapeutic exercise;Balance training;Neuromuscular re-education    PT Goals (Current goals can be found in the Care Plan section)  Acute Rehab PT Goals Patient Stated Goal: to return home with grandson PT Goal Formulation: With patient Time For Goal Achievement: 07/11/24 Potential to Achieve Goals: Good    Frequency Min 2X/week     Co-evaluation               AM-PAC PT 6 Clicks Mobility  Outcome Measure Help needed turning from your back to your side while in a flat bed without using bedrails?: None Help needed moving from lying on your back to sitting on the side of a flat bed without using bedrails?: None Help needed moving to and from a bed to a chair (including a wheelchair)?: None Help needed standing up from a chair using your arms (e.g., wheelchair or  bedside chair)?: A Little Help needed to walk in hospital room?: A Little Help needed climbing 3-5 steps with a railing? : A Little 6 Click Score: 21    End of Session Equipment Utilized During Treatment: Gait belt Activity Tolerance: Patient tolerated treatment well Patient left: in chair;with call bell/phone within reach Nurse Communication: Mobility status PT Visit Diagnosis: Unsteadiness on feet (R26.81);Other abnormalities of gait and mobility (R26.89);Muscle weakness (generalized) (M62.81);Difficulty in walking, not elsewhere classified (R26.2)    Time: 9061-9040 PT Time Calculation (min) (ACUTE ONLY): 21 min   Charges:   PT Evaluation $PT Eval Low Complexity: 1 Low   PT General Charges $$ ACUTE PT VISIT: 1 Visit         Fonda Simpers, PT, DPT Physical Therapist - [facility]  06/27/2024, 11:15 AM

## 2024-06-27 NOTE — TOC CM/SW Note (Cosign Needed)
 Transition of Care (TOC) CM/SW Note   .Patient is not able to walk the distance required to go the bathroom, or he/she is unable to safely negotiate stairs required to access the bathroom.  A 3in1 BSC will alleviate this problem

## 2024-06-27 NOTE — TOC Transition Note (Signed)
 Transition of Care [facility]) - Discharge Note   Patient Details  Name: Jeffrey Torres MRN: [id number] Date of Birth: Feb 10, 1946  Transition of Care [facility]) CM/SW Contact:  Alvaro Louder, LCSW Phone Number: 06/27/2024, 4:17 PM   Clinical Narrative:   LCSWA confirmed with MD that patient is stable for discharge. LCSWA notified the patient and they are in agreement with discharge. LCSWA discussed PT recommendation of HH Patient was agreeable. LCSWA reached out to [facility] admissions coordinator an started service for patient. The patient reported that he would have a friend come pick him up at discharge. LCSWA discussed PT recommendation  of WC and BSC 3in1. Patient was agreeable, LCSWA reached out to [employer] coordinator to set up equipment delivery.  TOC signing off        Patient Goals and CMS Choice            Discharge Placement                       Discharge Plan and Services Additional resources added to the After Visit Summary for                                       Social Drivers of Health (SDOH) Interventions SDOH Screenings   Food Insecurity: Patient Declined (06/25/2024)  Recent Concern: Food Insecurity - Food Insecurity Present (04/19/2024)  Housing: Unknown (06/25/2024)  Transportation Needs: Patient Declined (06/25/2024)  Utilities: Not At Risk (06/25/2024)  Alcohol Screen: Low Risk (04/19/2024)  Depression (PHQ2-9): High Risk (04/19/2024)  Financial Resource Strain: High Risk (04/19/2024)  Physical Activity: Sufficiently Active (04/19/2024)  Social Connections: Unknown (06/25/2024)  Recent Concern: Social Connections - Moderately Isolated (04/19/2024)  Stress: No Stress Concern Present (04/19/2024)  Tobacco Use: High Risk (06/24/2024)  Health Literacy: Adequate Health Literacy (04/19/2024)     Readmission Risk Interventions     No data to display

## 2024-06-28 ENCOUNTER — Telehealth: Payer: Self-pay

## 2024-06-28 NOTE — Transitions of Care (Post Inpatient/ED Visit) (Signed)
" ° °  06/28/2024  Name: Jeffrey Torres MRN: [id number] DOB: 20-Nov-1945  Today's TOC FU Call Status: Today's TOC FU Call Status:: Unsuccessful Call (1st Attempt) Unsuccessful Call (1st Attempt) Date: 06/28/24  Attempted to reach the patient regarding the most recent Inpatient/ED visit.  Follow Up Plan: Additional outreach attempts will be made to reach the patient to complete the Transitions of Care (Post Inpatient/ED visit) call.   Signature Julian Lemmings, LPN [facility] Nurse Health Advisor Direct Dial [phone]  "

## 2024-06-30 NOTE — Telephone Encounter (Signed)
 Copied from CRM #[id number]. Topic: General - Other >> Jun 28, 2024  3:39 PM Roselie BROCKS wrote: Reason for CRM: Patient returned a call from [facility], please call patient.

## 2024-07-03 NOTE — Transitions of Care (Post Inpatient/ED Visit) (Signed)
" ° °  07/03/2024  Name: Jeffrey Torres MRN: [id number] DOB: [birth year]  Today's TOC FU Call Status: Today's TOC FU Call Status:: Successful TOC FU Call Completed Unsuccessful Call (1st Attempt) Date: 06/28/24 [facility] FU Call Complete Date: 07/03/24  Patient's Name and Date of Birth confirmed. Name, DOB  Transition Care Management Follow-up Telephone Call Date of Discharge: 06/27/24 Discharge Facility: [facility]) Type of Discharge: Inpatient Admission Primary Inpatient Discharge Diagnosis:: fracture right leg Any questions or concerns?: No  Items Reviewed: Did you receive and understand the discharge instructions provided?: Yes Medications obtained,verified, and reconciled?: Yes (Medications Reviewed) Any new allergies since your discharge?: No Dietary orders reviewed?: Yes Do you have support at home?: Yes People in Home [RPT]: grandchild(ren)  Medications Reviewed Today: Medications Reviewed Today     Reviewed by Emmitt Pan, LPN (Licensed Practical Nurse) on 07/03/24 at 1430  Med List Status: <None>   Medication Order Taking? Sig Documenting Provider Last Dose Status Informant  albuterol  (VENTOLIN  HFA) 108 (90 Base) MCG/ACT inhaler [id number] Yes Inhale 2 puffs into the lungs every 6 (six) hours as needed. Tamea Dedra CROME, MD  Active Self  amLODipine  (NORVASC ) 5 MG tablet [id number] Yes Take 1 tablet (5 mg total) by mouth daily. Darliss Rogue, MD  Active Self  aspirin  81 MG tablet [id number] Yes Take 81 mg by mouth daily. [provider]  Active Self           Med Note WESTLY, MCKENZIE A   Tue Dec 28, 2017 10:00 AM)    ezetimibe  (ZETIA ) 10 MG tablet [id number] Yes Take 1 tablet (10 mg total) by mouth daily. Darliss Rogue, MD  Active Self  Fluticasone -Umeclidin-Vilant (TRELEGY ELLIPTA ) 100-62.5-25 MCG/ACT AEPB [id number] Yes Inhale 1 puff into the lungs daily.  Patient taking differently: Inhale 1 puff into the lungs daily as  needed.   Tamea Dedra CROME, MD  Active Self           Med Note GAYLENE DARVIN JINNY [place] Jun 25, 2024  8:01 AM) Per Pt: PRN only, due to possible interference with BP meds.   losartan  (COZAAR ) 100 MG tablet [id number] Yes TAKE 1 TABLET BY MOUTH EVERY DAY Darliss Rogue, MD  Active Self  sertraline  (ZOLOFT ) 100 MG tablet [id number] Yes TAKE 1 TABLET BY MOUTH EVERY DAY [name], MD  Active Self            Home Care and Equipment/Supplies: Were Home Health Services Ordered?: NA Any new equipment or medical supplies ordered?: Yes Name of Medical supply agency?: unknown Were you able to get the equipment/medical supplies?: Yes Do you have any questions related to the use of the equipment/supplies?: No  Functional Questionnaire: Do you need assistance with bathing/showering or dressing?: No Do you need assistance with meal preparation?: No Do you need assistance with eating?: No Do you have difficulty maintaining continence: No Do you need assistance with getting out of bed/getting out of a chair/moving?: No Do you have difficulty managing or taking your medications?: No  Follow up appointments reviewed: PCP Follow-up appointment confirmed?: Yes Date of PCP follow-up appointment?: 07/07/24 Follow-up Provider: [facility] Follow-up appointment confirmed?: NA Do you need transportation to your follow-up appointment?: No Do you understand care options if your condition(s) worsen?: Yes-patient verbalized understanding    SIGNATURE Pan Emmitt, LPN [facility] Nurse Health Advisor Direct Dial [phone]  "

## 2024-07-07 ENCOUNTER — Encounter: Payer: Self-pay | Admitting: Family Medicine

## 2024-07-07 ENCOUNTER — Ambulatory Visit: Admitting: Family Medicine

## 2024-07-07 VITALS — BP 135/63 | HR 71 | Resp 16 | Ht 70.0 in | Wt 208.0 lb

## 2024-07-07 DIAGNOSIS — R55 Syncope and collapse: Secondary | ICD-10-CM | POA: Diagnosis not present

## 2024-07-07 DIAGNOSIS — S82891D Other fracture of right lower leg, subsequent encounter for closed fracture with routine healing: Secondary | ICD-10-CM | POA: Diagnosis not present

## 2024-07-07 DIAGNOSIS — S82891A Other fracture of right lower leg, initial encounter for closed fracture: Secondary | ICD-10-CM

## 2024-07-07 DIAGNOSIS — I480 Paroxysmal atrial fibrillation: Secondary | ICD-10-CM

## 2024-07-07 DIAGNOSIS — I10 Essential (primary) hypertension: Secondary | ICD-10-CM

## 2024-07-07 MED ORDER — SERTRALINE HCL 100 MG PO TABS: 100.0000 mg | ORAL_TABLET | Freq: Every day | ORAL | Status: AC

## 2024-07-07 NOTE — Progress Notes (Signed)
 "     Established patient visit   Patient: Jeffrey Torres   DOB: [birth year]   79 y.o. Male  MRN: [id number] Visit Date: 07/07/2024  Today's healthcare provider: Nancyann Perry, MD   Chief Complaint  Patient presents with   Hospitalization Follow-up    Seen at ER 06/25/24   Subjective    Discussed the use of AI scribe software for clinical note transcription with the patient, who gave verbal consent to proceed.  History of Present Illness   Jeffrey Torres is a 79 year old male who presents for a hospital follow-up after an admission for syncope.  He was admitted to [facility] on December 28th for syncope and discharged on December 30th. He experienced a syncopal episode while burning debris in his yard, feeling lightheaded and queasy before passing out and injuring his ankle. He was told in the hospital that he had mild bradycardia and dehydration and received IV fluids. No further episodes of lightheadedness or dizziness have occurred since discharge.  During his hospital stay, he was diagnosed with a comminuted fracture of the distal right fibular metadiaphysis. He has not yet followed up with podiatry or orthopedics for his ankle fracture due to uncertainty about the appointment details and issues accessing his MyChart account.  He had a mild elevated troponin level of 29, which decreased to 20. His diltiazem  was stopped in the hospital and amlodipine  was started. However, he has continued taking diltiazem  since discharge, as he was unaware of the change. He is also on losartan , which was increased from 50 mg to 100 mg during his hospital stay.  He had a mild acute kidney injury during his admission, which resolved with IV fluids.  No current lightheadedness, dizziness, or issues with eating and drinking.       Medications: Outpatient Medications Prior to Visit  Medication Sig   albuterol  (VENTOLIN  HFA) 108 (90 Base) MCG/ACT inhaler Inhale 2 puffs into the lungs every 6 (six) hours as  needed.   amLODipine  (NORVASC ) 5 MG tablet Take 1 tablet (5 mg total) by mouth daily.   aspirin  81 MG tablet Take 81 mg by mouth daily.   diltiazem  (CARDIZEM  CD) 120 MG 24 hr capsule Take 120 mg by mouth daily.   ezetimibe  (ZETIA ) 10 MG tablet Take 1 tablet (10 mg total) by mouth daily.   Fluticasone -Umeclidin-Vilant (TRELEGY ELLIPTA ) 100-62.5-25 MCG/ACT AEPB Inhale 1 puff into the lungs daily. (Patient taking differently: Inhale 1 puff into the lungs daily as needed.)   losartan  (COZAAR ) 100 MG tablet TAKE 1 TABLET BY MOUTH EVERY DAY   sertraline  (ZOLOFT ) 100 MG tablet TAKE 1 TABLET BY MOUTH EVERY DAY (Patient not taking: Reported on 07/07/2024)   No facility-administered medications prior to visit.   Review of Systems     Objective    BP 135/63   Pulse 71   Resp 16   Ht 5' 10 (1.778 m)   Wt 208 lb (94.3 kg)   SpO2 100%   BMI 29.84 kg/m   Physical Exam   General: Appearance:    Well developed, well nourished male in no acute distress  Eyes:    PERRL, conjunctiva/corneas clear, EOM's intact       Lungs:     Clear to auscultation bilaterally, respirations unlabored  Heart:    Normal heart rate. Regular rhythm. No murmurs, rubs, or gallops.    MS:   All extremities are intact.  Right ankle brace in place.   Neurologic:  Awake, alert, oriented x 3. No apparent focal neurological defect.        Assessment & Plan     1. Syncope and collapse (Primary) Patient endorses that this was likely due to poor fluid intake and dehydration. Felt much after IV hydration while in the hospital. No dizziness or light headedness at this time.   2. Primary hypertension Diltiazem  was held during hospitalization due to bradycardia and syncopal spell, but he has been back on it since discharge and BP and HR seems to be normal. He has upcoming appointment with his cardiologist who prescribes it.   3. PAF (paroxysmal atrial fibrillation) (HCC) In SR on current medication regiment. Follow up  cardiology as scheduled.   4. Closed fracture of right ankle, initial encounter Secondary to fall during syncopal spell on 12/27. Now 13 days post injury wearing brace. - Ambulatory referral to Podiatry       Nancyann Perry, MD  [facility] [phone] (phone) [phone] (fax)  [facility] "

## 2024-07-10 NOTE — Telephone Encounter (Signed)
 Call pt call went straight to voicemail. I left message for pt to call me at [phone] or [phone] to schedule appt.

## 2024-07-11 NOTE — Telephone Encounter (Signed)
 Call left message for pt to return call

## 2024-07-18 NOTE — Progress Notes (Signed)
 Jeffrey Torres                                          MRN: [id number]   07/18/2024   The [facility] Quality Team Specialist reviewed this patient medical record for the purposes of chart review for care gap closure. The following were reviewed: chart review for care gap closure-controlling blood pressure.    [facility] Quality Team

## 2024-07-20 ENCOUNTER — Ambulatory Visit: Payer: Self-pay | Attending: Cardiology | Admitting: Cardiology

## 2024-07-20 ENCOUNTER — Encounter: Payer: Self-pay | Admitting: Cardiology

## 2024-07-20 VITALS — BP 120/60 | HR 64 | Ht 71.0 in | Wt 211.8 lb

## 2024-07-20 DIAGNOSIS — I2510 Atherosclerotic heart disease of native coronary artery without angina pectoris: Secondary | ICD-10-CM

## 2024-07-20 DIAGNOSIS — I10 Essential (primary) hypertension: Secondary | ICD-10-CM | POA: Diagnosis not present

## 2024-07-20 NOTE — Patient Instructions (Signed)

## 2024-07-20 NOTE — Progress Notes (Signed)
 " Cardiology Office Note:    Date:  07/20/2024   ID:  Jeffrey Torres, DOB 06-16-1946, MRN [id number]  PCP:  [name], MD  [facility] Cardiologist:  Redell Cave, MD  [facility] Electrophysiologist:  None   Referring MD: [name], MD   Chief Complaint  Patient presents with   Follow-up    8 week follow up pt has been doing well with no complaints of chest pain, chest pressure or SOB, medication reviewed verbally with patient    History of Present Illness:    Jeffrey Torres is a 79 y.o. male with a hx of CAD (LAD and RCA calcifications on chest CT 10/23), hypertension, OSA, former smoker x35+ years, COPD, non-small cell right lung cancer s/p surgical resection who presents for follow-up.    Doing okay, denies chest pain or shortness of breath.  Had an episode of dizziness and fall injuring right ankle 3 weeks ago.  Tributes symptoms to dehydration as he was pretty hot on that day, patient was also burning refuge and inhaling fumes.  Right foot was placed in a cast, recently removed, has follow-up appointment with orthopedics next week.  Blood pressures at home have been well-controlled.  He has a history of bradycardia while taking Cardizem , heart rates better with stopping Cardizem  and starting Norvasc ..   Prior notes.   Lexiscan  Myoview  10/23, low risk study, no evidence of ischemia Echo 02/2022 EF 60 to 65%, aortic valve sclerosis Patient on Cardizem  due to history of PACs, PVCs, atrial bigeminy and hypertension. diagnosed with OSA, CPAP mask recommended but patient does not like to wear a mask.  He will try to lose weight to see if this will help with his sleep apnea.     Past Medical History:  Diagnosis Date   Acute metabolic encephalopathy 07/31/2020   Anxiety    Arthritis    CAD (coronary artery disease)    a. 02/2022 Chest CT: LAD/RCA Ca2+; b. 03/2022 Lexi MV: no ischemia/infarct; c. 02/2022 Echo: EF 60-65%, AoV sclerosis.   [facility]cer [facility])    larynx  ca, surgery and radiation   COPD (chronic obstructive pulmonary disease) (HCC)    GERD (gastroesophageal reflux disease)    Heart murmur    a. 02/2022 Echo: AoV sclerosis.   Hernia, inguinal, bilateral    History of chicken pox    History of kidney stones    History of measles    History of mumps    History of shingles    History of tobacco abuse    HOH (hard of hearing)    Hypertension    Macular degeneration of both eyes    Motion sickness    ocean boat   OSA (obstructive sleep apnea)    a. Does not tolerate CPAP.   Pneumonia due to COVID-19 virus 07/31/2020   PVC (premature ventricular contraction)     Past Surgical History:  Procedure Laterality Date   CARDIAC CATHETERIZATION  2010   normal per patient report   CATARACT EXTRACTION W/PHACO Right 07/18/2019   Procedure: CATARACT EXTRACTION PHACO AND INTRAOCULAR LENS PLACEMENT (IOC) RIGHT 4.54 00:29.7;  Surgeon: Jaye Fallow, MD;  [facility];  Service: Ophthalmology;  Laterality: Right;   CATARACT EXTRACTION W/PHACO Left 08/08/2019   Procedure: CATARACT EXTRACTION PHACO AND INTRAOCULAR LENS PLACEMENT (IOC) LEFT;  Surgeon: Jaye Fallow, MD;  Location: [facility] ORS;  Service: Ophthalmology;  Laterality: Left;  Lot #[id number] H US : 00:34.5 CDE: 4.75   COLONOSCOPY  06/24/2006  Dr. Dessa. Multiple benign appearing 5mm polyps in the cecum and in the rectum. -Three 8mm polyps in the transversed colon, in the tranverse colon, proximal and in the distal transverse colon. Resected and retrieved.   COLONOSCOPY WITH PROPOFOL  N/A 03/12/2020   Procedure: COLONOSCOPY WITH PROPOFOL ;  Surgeon: Jinny Carmine, MD;  Location: [facility] END[facility];  Service: Endoscopy;  Laterality: N/A;   CYSTOSCOPY WITH HOLMIUM LASER LITHOTRIPSY  2001   kidney stones removed, [facility]   HERNIA REPAIR     umbilical and bilateral inguninal   INTERCOSTAL NERVE BLOCK  05/28/2022   Procedure: INTERCOSTAL NERVE BLOCK;  Surgeon: Kerrin Elspeth BROCKS,  MD;  Location: [facility] OR;  Service: Thoracic;;   LARYNGOSCOPY Right 04/01/2017   Procedure: SUSPENSION LARYNGOSCOPY WITH MICROFLAP EXCISION;  Surgeon: Milissa Hamming, MD;  Location: [facility] ORS;  Service: ENT;  Laterality: Right;   LYMPH NODE DISSECTION  05/28/2022   Procedure: LYMPH NODE DISSECTION;  Surgeon: Kerrin Elspeth BROCKS, MD;  Location: [facility] OR;  Service: Thoracic;;   sinus surgery   2002   [place]; removal of polyps   TONSILLECTOMY  1954   Tubular adenoma removed  06/24/2006    Current Medications: Current Meds  Medication Sig   albuterol  (VENTOLIN  HFA) 108 (90 Base) MCG/ACT inhaler Inhale 2 puffs into the lungs every 6 (six) hours as needed.   amLODipine  (NORVASC ) 5 MG tablet Take 1 tablet (5 mg total) by mouth daily.   aspirin  81 MG tablet Take 81 mg by mouth daily.   diltiazem  (CARDIZEM  CD) 120 MG 24 hr capsule Take 120 mg by mouth daily.   ezetimibe  (ZETIA ) 10 MG tablet Take 1 tablet (10 mg total) by mouth daily.   Fluticasone -Umeclidin-Vilant (TRELEGY ELLIPTA ) 100-62.5-25 MCG/ACT AEPB Inhale 1 puff into the lungs daily. (Patient taking differently: Inhale 1 puff into the lungs daily as needed.)   losartan  (COZAAR ) 100 MG tablet TAKE 1 TABLET BY MOUTH EVERY DAY   sertraline  (ZOLOFT ) 100 MG tablet Take 1 tablet (100 mg total) by mouth daily.     Allergies:   Penicillins, Atorvastatin, and Trazodone  and nefazodone   Social History   Socioeconomic History   Marital status: Divorced    Spouse name: Not on file   Number of children: 2   Years of education: Not on file   Highest education level: 12th grade  Occupational History   Occupation: Truck driver    Comment: unemployed  Tobacco Use   Smoking status: Every Day    Current packs/day: 1.00    Average packs/day: 1 pack/day for 53.1 years (53.1 ttl pk-yrs)    Types: Cigarettes    Start date: 1973   Smokeless tobacco: Never   Tobacco comments:    Smokes 3-4 cigarettes some days- 02/01/2023  Vaping Use   Vaping  status: Never Used  Substance and Sexual Activity   Alcohol use: Not Currently    Alcohol/week: 0.0 standard drinks of alcohol   Drug use: No   Sexual activity: Not on file  Other Topics Concern   Not on file  Social History Narrative   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   Social Drivers of Health   Tobacco Use: High Risk (07/20/2024)   Patient History    Smoking Tobacco Use: Every Day    Smokeless Tobacco Use: Never    Passive Exposure: Not on file  Financial Resource Strain: High Risk (07/11/2024)   [facility]   Overall Financial Resource Strain (CARDIA)    Difficulty of  Paying Living Expenses: Hard  Food Insecurity: Food Insecurity Present (07/11/2024)   Received from [facility]   Epic    Within the past 12 months, you worried that your food would run out before you got the money to buy more.: Often true    Within the past 12 months, the food you bought just didn't last and you didn't have money to get more.: Often true  Transportation Needs: No Transportation Needs (07/11/2024)   Received from [facility] - Transportation    In the past 12 months, has lack of transportation kept you from medical appointments or from getting medications?: No    Lack of Transportation (Non-Medical): No  Physical Activity: Sufficiently Active (04/19/2024)   Exercise Vital Sign    Days of Exercise per Week: 7 days    Minutes of Exercise per Session: 40 min  Stress: No Stress Concern Present (04/19/2024)   [employer] - Occupational Stress Questionnaire    Feeling of Stress: Only a little  Social Connections: Unknown (06/25/2024)   Social Connection and Isolation Panel    Frequency of Communication with Friends and Family: More than three times a week    Frequency of Social Gatherings with Friends and Family: More than three times a week    Attends Religious Services: More than 4  times per year    Active Member of [employer] or Organizations: Patient declined    Attends Banker Meetings: Patient declined    Marital Status: Patient unable to answer  Recent Concern: Social Connections - Moderately Isolated (04/19/2024)   Social Connection and Isolation Panel    Frequency of Communication with Friends and Family: More than three times a week    Frequency of Social Gatherings with Friends and Family: More than three times a week    Attends Religious Services: 1 to 4 times per year    Active Member of Clubs or Organizations: No    Attends Banker Meetings: Never    Marital Status: Divorced  Depression (PHQ2-9): High Risk (04/19/2024)   Depression (PHQ2-9)    PHQ-2 Score: 12  Alcohol Screen: Low Risk (04/19/2024)   Alcohol Screen    Last Alcohol Screening Score (AUDIT): 2  Housing: High Risk (07/11/2024)   Received from [facility]   Epic    In the last 12 months, was there a time when you were not able to pay the mortgage or rent on time?: Yes    In the past 12 months, how many times have you moved where you were living?: 0    At any time in the past 12 months, were you homeless or living in a shelter (including now)?: No  Utilities: Not At Risk (07/11/2024)   Received from [facility]   Epic    In the past 12 months has the electric, gas, oil, or water company threatened to shut off services in your home?: No  Health Literacy: Adequate Health Literacy (04/19/2024)   B1300 Health Literacy    Frequency of need for help with medical instructions: Never     Family History: The patient's family history includes Heart disease in his father; Lung cancer in his mother.  ROS:   Please see the history of present illness.     All other systems reviewed and are negative.  EKGs/Labs/Other Studies Reviewed:    The following studies were reviewed today:   Recent Labs: 04/19/2024:  ALT 14 06/26/2024: BUN 19;  Creatinine, Ser 1.04; Hemoglobin 13.8; Platelets 213; Potassium 3.9; Sodium 138  Recent Lipid Panel    Component Value Date/Time   CHOL 128 04/19/2024 1000   TRIG 128 04/19/2024 1000   HDL 45 04/19/2024 1000   CHOLHDL 2.8 04/19/2024 1000   LDLCALC 60 04/19/2024 1000    Physical Exam:    VS:  BP 120/60 (BP Location: Left Arm, Patient Position: Sitting)   Pulse 64   Ht 5' 11 (1.803 m)   Wt 211 lb 12.8 oz (96.1 kg)   SpO2 98%   BMI 29.54 kg/m     Wt Readings from Last 3 Encounters:  07/20/24 211 lb 12.8 oz (96.1 kg)  07/07/24 208 lb (94.3 kg)  06/25/24 205 lb 0.4 oz (93 kg)     GEN:  Well nourished, well developed in no acute distress HEENT: Normal NECK: No JVD; No carotid bruits CARDIAC: RRR, no murmurs, rubs, gallops RESPIRATORY:  Clear to auscultation without rales, wheezing or rhonchi  ABDOMEN: Soft, non-tender, non-distended MUSCULOSKELETAL:  No edema; No deformity  SKIN: Warm and dry NEUROLOGIC:  Alert and oriented x 3 PSYCHIATRIC:  Normal affect   ASSESSMENT:    1. Coronary artery disease involving native coronary artery of native heart without angina pectoris   2. Primary hypertension    PLAN:    In order of problems listed above:  CAD, RCA and LAD calcifications on chest CT. denies chest pain, echo 9/23 EF 60 to 65%, Lexiscan  Myoview  10/23 no ischemia, low risk study.  Intolerance to statins, tolerating Zetia .  Continue Zetia  10 mg daily, aspirin  81 mg,. Hypertension, BP controlled, bradycardia resolved with stopping Cardizem .  Continue amlodipine  5 mg daily,  losartan  100 mg daily.    Follow-up in 1 year.   Medication Adjustments/Labs and Tests Ordered: Current medicines are reviewed at length with the patient today.  Concerns regarding medicines are outlined above.  No orders of the defined types were placed in this encounter.   No orders of the defined types were placed in this encounter.    Patient Instructions  Medication Instructions:  Your  physician recommends that you continue on your current medications as directed. Please refer to the Current Medication list given to you today.   *If you need a refill on your cardiac medications before your next appointment, please call your pharmacy*  Lab Work: No labs ordered today  If you have labs (blood work) drawn today and your tests are completely normal, you will receive your results only by: MyChart Message (if you have MyChart) OR A paper copy in the mail If you have any lab test that is abnormal or we need to change your treatment, we will call you to review the results.  Testing/Procedures: No test ordered today   Follow-Up: At [facility], you and your health needs are our priority.  As part of our continuing mission to provide you with exceptional heart care, our providers are all part of one team.  This team includes your primary Cardiologist (physician) and Advanced Practice Providers or APPs (Physician Assistants and Nurse Practitioners) who all work together to provide you with the care you need, when you need it.  Your next appointment:   1 year(s)  Provider:   You may see Redell Cave, MD or one of the following Advanced Practice Providers on your designated Care Team:   Lonni Meager, NP Lesley Maffucci, PA-C Bernardino Bring, PA-C Cadence Franchester, [place] Tylene Lunch, NP  Barnie Hila, NP    We recommend signing up for the patient portal called MyChart.  Sign up information is provided on this After Visit Summary.  MyChart is used to connect with patients for Virtual Visits (Telemedicine).  Patients are able to view lab/test results, encounter notes, upcoming appointments, etc.  Non-urgent messages can be sent to your provider as well.   To learn more about what you can do with MyChart, go to [email or web address].             Signed, Redell Cave, MD  07/20/2024 12:15 PM    [facility] "

## 2024-07-20 NOTE — Telephone Encounter (Signed)
"  Call pt no answer  "

## 2024-07-26 NOTE — Telephone Encounter (Signed)
 Call pt no return call. I will wait for pt to return call to office to schedule.

## 2025-05-17 ENCOUNTER — Ambulatory Visit

## 2025-06-05 ENCOUNTER — Inpatient Hospital Stay: Admitting: Nurse Practitioner
